# Patient Record
Sex: Female | Born: 1950
Health system: Southern US, Academic
[De-identification: ages and names within clinical notes are randomized; demographics above are authoritative.]

## PROBLEM LIST (undated history)

## (undated) ENCOUNTER — Encounter

## (undated) ENCOUNTER — Encounter: Attending: Hematology & Oncology | Primary: Hematology & Oncology

## (undated) ENCOUNTER — Inpatient Hospital Stay

## (undated) ENCOUNTER — Telehealth

## (undated) ENCOUNTER — Telehealth: Attending: Vascular Surgery | Primary: Vascular Surgery

## (undated) ENCOUNTER — Telehealth: Attending: Research Study | Primary: Research Study

## (undated) ENCOUNTER — Ambulatory Visit

## (undated) ENCOUNTER — Encounter: Attending: Internal Medicine | Primary: Internal Medicine

## (undated) ENCOUNTER — Ambulatory Visit: Payer: PRIVATE HEALTH INSURANCE | Attending: Radiation Oncology | Primary: Radiation Oncology

## (undated) ENCOUNTER — Telehealth
Attending: Pharmacist Clinician (PhC)/ Clinical Pharmacy Specialist | Primary: Pharmacist Clinician (PhC)/ Clinical Pharmacy Specialist

## (undated) ENCOUNTER — Encounter
Attending: Pharmacist Clinician (PhC)/ Clinical Pharmacy Specialist | Primary: Pharmacist Clinician (PhC)/ Clinical Pharmacy Specialist

## (undated) ENCOUNTER — Encounter: Payer: PRIVATE HEALTH INSURANCE | Attending: Hematology & Oncology | Primary: Hematology & Oncology

## (undated) ENCOUNTER — Ambulatory Visit: Attending: Radiation Oncology | Primary: Radiation Oncology

## (undated) ENCOUNTER — Encounter: Payer: PRIVATE HEALTH INSURANCE | Attending: Radiation Oncology | Primary: Radiation Oncology

## (undated) ENCOUNTER — Ambulatory Visit
Attending: Pharmacist Clinician (PhC)/ Clinical Pharmacy Specialist | Primary: Pharmacist Clinician (PhC)/ Clinical Pharmacy Specialist

## (undated) ENCOUNTER — Ambulatory Visit: Payer: PRIVATE HEALTH INSURANCE

## (undated) ENCOUNTER — Encounter: Payer: PRIVATE HEALTH INSURANCE | Attending: Vascular Surgery | Primary: Vascular Surgery

## (undated) ENCOUNTER — Telehealth: Attending: Hematology & Oncology | Primary: Hematology & Oncology

## (undated) DIAGNOSIS — I10 Essential (primary) hypertension: Secondary | ICD-10-CM

## (undated) DIAGNOSIS — L0390 Cellulitis, unspecified: Secondary | ICD-10-CM

## (undated) DIAGNOSIS — I469 Cardiac arrest, cause unspecified: Secondary | ICD-10-CM

## (undated) DIAGNOSIS — C801 Malignant (primary) neoplasm, unspecified: Secondary | ICD-10-CM

## (undated) DIAGNOSIS — C3490 Malignant neoplasm of unspecified part of unspecified bronchus or lung: Secondary | ICD-10-CM

## (undated) DIAGNOSIS — C229 Malignant neoplasm of liver, not specified as primary or secondary: Secondary | ICD-10-CM

## (undated) DIAGNOSIS — N289 Disorder of kidney and ureter, unspecified: Secondary | ICD-10-CM

## (undated) DIAGNOSIS — C719 Malignant neoplasm of brain, unspecified: Secondary | ICD-10-CM

## (undated) HISTORY — PX: LEG SURGERY: SHX1003

## (undated) HISTORY — PX: BRONCHOSCOPY: SUR163

## (undated) HISTORY — DX: Malignant neoplasm of unspecified part of unspecified bronchus or lung: C34.90

## (undated) HISTORY — PX: THORACENTESIS: SHX235

---
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
## 2015-11-07 ENCOUNTER — Emergency Department (HOSPITAL_BASED_OUTPATIENT_CLINIC_OR_DEPARTMENT_OTHER)
Admission: EM | Admit: 2015-11-07 | Discharge: 2015-11-07 | Disposition: A | Discharge status: Home / Self Care | Payer: BC Managed Care – PPO | Attending: Emergency Medicine | Admitting: Emergency Medicine

## 2015-11-07 ENCOUNTER — Encounter (HOSPITAL_BASED_OUTPATIENT_CLINIC_OR_DEPARTMENT_OTHER): Payer: Self-pay | Admitting: *Deleted

## 2015-11-07 ENCOUNTER — Emergency Department (HOSPITAL_BASED_OUTPATIENT_CLINIC_OR_DEPARTMENT_OTHER): Payer: BC Managed Care – PPO

## 2015-11-07 DIAGNOSIS — I82A12 Acute embolism and thrombosis of left axillary vein: Secondary | ICD-10-CM | POA: Insufficient documentation

## 2015-11-07 DIAGNOSIS — I10 Essential (primary) hypertension: Secondary | ICD-10-CM | POA: Insufficient documentation

## 2015-11-07 DIAGNOSIS — M7989 Other specified soft tissue disorders: Secondary | ICD-10-CM | POA: Diagnosis present

## 2015-11-07 DIAGNOSIS — Z79899 Other long term (current) drug therapy: Secondary | ICD-10-CM | POA: Diagnosis not present

## 2015-11-07 DIAGNOSIS — Z85118 Personal history of other malignant neoplasm of bronchus and lung: Secondary | ICD-10-CM | POA: Diagnosis not present

## 2015-11-07 HISTORY — DX: Disorder of kidney and ureter, unspecified: N28.9

## 2015-11-07 HISTORY — DX: Cellulitis, unspecified: L03.90

## 2015-11-07 HISTORY — DX: Essential (primary) hypertension: I10

## 2015-11-07 HISTORY — DX: Malignant (primary) neoplasm, unspecified: C80.1

## 2015-11-07 LAB — CBC WITH DIFFERENTIAL/PLATELET
BASOS ABS: 0 10*3/uL (ref 0.0–0.1)
BASOS PCT: 0 %
Eosinophils Absolute: 0.1 10*3/uL (ref 0.0–0.7)
Eosinophils Relative: 2 %
HEMATOCRIT: 34.6 % — AB (ref 36.0–46.0)
HEMOGLOBIN: 10.8 g/dL — AB (ref 12.0–15.0)
Lymphocytes Relative: 20 %
Lymphs Abs: 0.7 10*3/uL (ref 0.7–4.0)
MCH: 27.4 pg (ref 26.0–34.0)
MCHC: 31.2 g/dL (ref 30.0–36.0)
MCV: 87.8 fL (ref 78.0–100.0)
Monocytes Absolute: 0.5 10*3/uL (ref 0.1–1.0)
Monocytes Relative: 14 %
NEUTROS ABS: 2.3 10*3/uL (ref 1.7–7.7)
NEUTROS PCT: 64 %
Platelets: 233 10*3/uL (ref 150–400)
RBC: 3.94 MIL/uL (ref 3.87–5.11)
RDW: 16.4 % — ABNORMAL HIGH (ref 11.5–15.5)
WBC: 3.5 10*3/uL — ABNORMAL LOW (ref 4.0–10.5)

## 2015-11-07 LAB — BASIC METABOLIC PANEL
ANION GAP: 6 (ref 5–15)
BUN: 14 mg/dL (ref 6–20)
CALCIUM: 7.7 mg/dL — AB (ref 8.9–10.3)
CO2: 24 mmol/L (ref 22–32)
Chloride: 107 mmol/L (ref 101–111)
Creatinine, Ser: 0.71 mg/dL (ref 0.44–1.00)
Glucose, Bld: 90 mg/dL (ref 65–99)
POTASSIUM: 3.3 mmol/L — AB (ref 3.5–5.1)
Sodium: 137 mmol/L (ref 135–145)

## 2015-11-07 MED ORDER — RIVAROXABAN 15 MG PO TABS
15.0000 mg | ORAL_TABLET | Freq: Once | ORAL | Status: AC
  Administered 2015-11-07: 15 mg via ORAL
  Filled 2015-11-07: qty 1

## 2015-11-07 MED ORDER — RIVAROXABAN (XARELTO) EDUCATION KIT FOR DVT/PE PATIENTS
PACK | Freq: Once | Status: AC
  Administered 2015-11-07: 20:00:00

## 2015-11-07 MED ORDER — RIVAROXABAN (XARELTO) VTE STARTER PACK (15 & 20 MG): ORAL_TABLET | ORAL | 0 refills | Status: DC

## 2015-11-07 NOTE — ED Provider Notes (Signed)
[place] DEPT [facility] Provider Note   CSN: [id number] Arrival date & time: 11/07/15  1658  First Provider Contact:  First MD Initiated Contact with Patient 11/07/15 1808     By signing my name below, I, Ephriam Jenkins, attest that this documentation has been prepared under the direction and in the presence of [facility].  Electronically Signed: Ephriam Jenkins, ED Scribe. 11/07/15. 7:46 PM.  History   Chief Complaint Chief Complaint  Patient presents with  . Arm Swelling    HPI HPI Comments: Jennifer Delgado is a 65 y.o. female who presents to the Emergency Department complaining of gradually worsening swelling and pain to her left upper extremity that started 3 days ago. Pt receives chemotherapy treatment for her lung cancer and states she normally has mild edema to her upper extremities as a side effect of her treatment; but reports a gradual increase in swelling to her left arm. Pt states she had to cut her wedding ring off of her ring finger today due to the increase in swelling. Pt also reports pain to the upper aspect of her left arm. Pt reports that the pain to her arm is exacerbated by bending her arm and raising it above her head. Pt reports intermittent chills recently; none today. Pt currently takes Crizotinib. Pt denies any numbness or tingling, neck pain, chest pain, shortness of breath.      The history is provided by the patient. No language interpreter was used.    Past Medical History:  Diagnosis Date  . Cancer ([place])    lung  . Cellulitis   . Hypertension   . Renal disorder     There are no active problems to display for this patient.   Past Surgical History:  Procedure Laterality Date  . BRONCHOSCOPY    . THORACENTESIS      OB History    No data available       Home Medications    Prior to Admission medications   Medication Sig Start Date End Date Taking? Authorizing Provider  diclofenac sodium (VOLTAREN) 1 % GEL Apply topically 4 (four) times  daily.   Yes Historical Provider, MD  doxycycline (DORYX) 100 MG EC tablet Take 100 mg by mouth 2 (two) times daily.   Yes Historical Provider, MD  furosemide (LASIX) 20 MG tablet Take 20 mg by mouth.   Yes Historical Provider, MD  HYDROcodone-acetaminophen (NORCO) 10-325 MG tablet Take 1 tablet by mouth every 6 (six) hours as needed.   Yes Historical Provider, MD  Iron Combinations (CHROMAGEN) capsule Take 1 capsule by mouth daily.   Yes Historical Provider, MD  lisinopril (PRINIVIL,ZESTRIL) 10 MG tablet Take 10 mg by mouth daily.   Yes Historical Provider, MD  LORazepam (ATIVAN) 0.5 MG tablet Take 0.5 mg by mouth every 8 (eight) hours.   Yes Historical Provider, MD  potassium chloride SA (K-DUR,KLOR-CON) 20 MEQ tablet Take 20 mEq by mouth 2 (two) times daily.   Yes Historical Provider, MD  prochlorperazine (COMPAZINE) 10 MG tablet Take 10 mg by mouth every 6 (six) hours as needed for nausea or vomiting.   Yes Historical Provider, MD  sertraline (ZOLOFT) 100 MG tablet Take 100 mg by mouth daily.   Yes Historical Provider, MD  Rivaroxaban 15 & 20 MG TBPK Take as directed on package: Start with one 7m tablet by mouth twice a day with food. On Day 22, switch to one 256mtablet once a day with food. 11/07/15   KaGloriann LoanPA-C  Family History No family history on file.  Social History Social History  Substance Use Topics  . Smoking status: Never Smoker  . Smokeless tobacco: Never Used  . Alcohol use Yes     Allergies   Daptomycin and Dilaudid [hydromorphone]   Review of Systems Review of Systems  Constitutional: Positive for chills (intermittent).  Respiratory: Negative for shortness of breath.   Cardiovascular: Negative for chest pain.  Musculoskeletal: Positive for arthralgias (left upper extremity). Negative for neck pain.  Neurological: Negative for numbness.  All other systems reviewed and are negative.    Physical Exam Updated Vital Signs BP 126/66 (BP Location: Right  Arm)   Pulse 80   Temp 98.3 F (36.8 C) (Oral)   Resp 18   Ht 5' 5"  (1.651 m)   Wt 95.3 kg   SpO2 97%   BMI 34.95 kg/m   Physical Exam  Constitutional: She is oriented to person, place, and time. She appears well-developed and well-nourished.  Non-toxic appearance. She does not have a sickly appearance. She does not appear ill.  HENT:  Head: Normocephalic and atraumatic.  Mouth/Throat: Oropharynx is clear and moist.  Eyes: Conjunctivae are normal. Pupils are equal, round, and reactive to light.  Neck: Normal range of motion. Neck supple. No tracheal deviation present.  No neck swelling or color changes.   Cardiovascular: Normal rate and regular rhythm.   Pulses:      Radial pulses are 2+ on the right side, and 2+ on the left side.  Unilateral left upper extremity edema, non-pitting.  Pulmonary/Chest: Effort normal and breath sounds normal. No accessory muscle usage or stridor. No respiratory distress. She has no wheezes. She has no rhonchi. She has no rales.  Abdominal: Soft. Bowel sounds are normal. She exhibits no distension. There is no tenderness.  Musculoskeletal: Normal range of motion. She exhibits tenderness.  TTP along medial bicep.  No palpable cords.   Lymphadenopathy:    She has no cervical adenopathy.  Neurological: She is alert and oriented to person, place, and time.  Normal strength and sensation.   Skin: Skin is warm and dry. Capillary refill takes less than 2 seconds.  Scattered purpura over BUE.  NO signs of infection.   Psychiatric: She has a normal mood and affect. Her behavior is normal.     ED Treatments / Results  DIAGNOSTIC STUDIES: Oxygen Saturation is 99% on RA, normal by my interpretation.  COORDINATION OF CARE: 6:31 PM-Will order blood work, [place]. Discussed treatment plan with pt at bedside and pt agreed to plan.   Labs (all labs ordered are listed, but only abnormal results are displayed) Labs Reviewed  BASIC METABOLIC PANEL - Abnormal;  Notable for the following:       Result Value   Potassium 3.3 (*)    Calcium 7.7 (*)    All other components within normal limits  CBC WITH DIFFERENTIAL/PLATELET - Abnormal; Notable for the following:    WBC 3.5 (*)    Hemoglobin 10.8 (*)    HCT 34.6 (*)    RDW 16.4 (*)    All other components within normal limits    EKG  EKG Interpretation None       Radiology US Venous Img Upper Uni Left  Result Date: 11/07/2015 CLINICAL DATA:  Redness and swelling of elbow to hand. On chemotherapy for lung cancer. Recent IV insertion and left elbow area. EXAM: LEFT UPPER EXTREMITY VENOUS DOPPLER ULTRASOUND TECHNIQUE: Gray-scale sonography with graded compression, as well as color  Doppler and duplex ultrasound were performed to evaluate the upper extremity deep venous system from the level of the subclavian vein and including the jugular, axillary, basilic, radial, ulnar and upper cephalic vein. Spectral Doppler was utilized to evaluate flow at rest and with distal augmentation maneuvers. COMPARISON:  None. FINDINGS: Contralateral Subclavian Vein: Respiratory phasicity is normal and symmetric with the symptomatic side. No evidence of thrombus. Normal compressibility. Internal Jugular Vein: No evidence of thrombus. Normal compressibility, respiratory phasicity and response to augmentation. Subclavian Vein: No evidence of thrombus. Normal compressibility, respiratory phasicity and response to augmentation. Axillary Vein: Nonocclusive thrombus within the left axillary vein. Cephalic Vein: No evidence of thrombus. Normal compressibility, respiratory phasicity and response to augmentation. Basilic Vein: Occlusive thrombus throughout the basilic vein, from axilla to mid forearm. Brachial Veins: No evidence of thrombus. Normal compressibility, respiratory phasicity and response to augmentation. Radial Veins: No evidence of thrombus. Normal compressibility, respiratory phasicity and response to augmentation. Ulnar  Veins: No evidence of thrombus. Normal compressibility, respiratory phasicity and response to augmentation. Venous Reflux:  None visualized. Other Findings:  None visualized. IMPRESSION: 1. Nonocclusive DVT within a focal segment of the upper left axillary vein. 2. Occlusive thrombus throughout within the superficial left basilic vein. These results will be called to the ordering clinician or representative by the Radiologist Assistant, and communication documented in the PACS or zVision Dashboard. Electronically Signed   By: Franki Cabot M.D.   On: 11/07/2015 19:30    Procedures Procedures (including critical care time)  Medications Ordered in ED Medications  rivaroxaban Alveda Reasons) Education Kit for DVT/PE patients ( Does not apply Given 11/07/15 2013)  Rivaroxaban (XARELTO) tablet 15 mg (15 mg Oral Given 11/07/15 2013)     Initial Impression / Assessment and Plan / ED Course  I have reviewed the triage vital signs and the nursing notes.  Pertinent labs & imaging results that were available during my care of the patient were reviewed by me and considered in my medical decision making (see chart for details).  Clinical Course   Patient presents with left arm swelling. Workup reveals a left upper extremity DVT. She does not have any chest pain, shortness of breath to suggest PE. VSS, NAD. Patient given an initial dose of xarelto an ED. We'll discharge home with xarelto and close PCP follow up.  Return precautions discussed including worsening pain, shortness of breath, or chest pain.  Patient agrees and acknowledges the above plan for discharge.  Case has been discussed with Dr. Lita Mains who agrees with the above plan for discharge.     Final Clinical Impressions(s) / ED Diagnoses   Final diagnoses:  Left arm swelling  Acute deep vein thrombosis (DVT) of axillary vein of left upper extremity (HCC)    New Prescriptions New Prescriptions   RIVAROXABAN 15 & 20 MG TBPK    Take as directed  on package: Start with one 16m tablet by mouth twice a day with food. On Day 22, switch to one 233mtablet once a day with food.   I personally performed the services described in this documentation, which was scribed in my presence. The recorded information has been reviewed and is accurate.     KaGloriann LoanPA-C 11/07/15 2055    DaJulianne RiceMD 11/10/15 2239

## 2015-11-07 NOTE — ED Notes (Signed)
Pt reports worsening of L arm swelling x 3 days ago with pain to upper arm. Pt also has red discoloration to L arm which she states is possibly a reaction from her chemo medication. Pt takes oral chemo daily. Finished radiation in November.

## 2015-11-07 NOTE — ED Notes (Signed)
Rad report:  1. Nonocclusive DVT within a focal segment of the upper left axillary vein. 2. Occlusive thrombus throughout within the superficial left basilic vein

## 2015-11-07 NOTE — ED Notes (Signed)
Patient transported to Ultrasound 

## 2015-11-07 NOTE — ED Triage Notes (Signed)
Pt reports left arm swelling (worse over the past 3days) and upper arm pain. Pt states she is currently on immunotherapy for lung cancer.

## 2015-11-07 NOTE — ED Notes (Addendum)
xarelto given with crackers, mentions L arm pain only, 2/10, (denies: CP, back pain, sob, nv, dizziness, palpitations, fever or other sx). Alert, NAD, calm, interactive, no dyspnea noted, skin W&D.

## 2015-11-08 NOTE — ED Notes (Signed)
Pt's daughter, Debe Coder, called to say that they could not locate Xarelto in the strength needed.   [facility] ([place], [place], [place])  [place]) who said they had the proper strength.  Called pt's daughter back and gave her this information.

## 2016-10-06 ENCOUNTER — Ambulatory Visit: Admission: RE | Admit: 2016-10-06 | Discharge: 2016-10-06 | Disposition: A | Discharge status: Home / Self Care

## 2016-10-06 DIAGNOSIS — L97921 Non-pressure chronic ulcer of unspecified part of left lower leg limited to breakdown of skin: Principal | ICD-10-CM

## 2016-10-06 DIAGNOSIS — M7989 Other specified soft tissue disorders: Principal | ICD-10-CM

## 2016-10-06 DIAGNOSIS — Z945 Skin transplant status: Secondary | ICD-10-CM

## 2016-10-06 DIAGNOSIS — M79605 Pain in left leg: Secondary | ICD-10-CM

## 2016-10-06 DIAGNOSIS — A0472 Enterocolitis due to Clostridium difficile, not specified as recurrent: Secondary | ICD-10-CM

## 2016-10-10 ENCOUNTER — Ambulatory Visit: Admission: RE | Admit: 2016-10-10 | Discharge: 2016-10-10 | Disposition: A | Discharge status: Home / Self Care

## 2016-10-10 DIAGNOSIS — C3490 Malignant neoplasm of unspecified part of unspecified bronchus or lung: Principal | ICD-10-CM

## 2016-10-17 MED ORDER — FUROSEMIDE 20 MG TABLET
ORAL_TABLET | 0 refills | 0 days | Status: CP
Start: 2016-10-17 — End: 2016-11-18

## 2016-10-20 ENCOUNTER — Ambulatory Visit
Admission: RE | Admit: 2016-10-20 | Discharge: 2016-10-20 | Disposition: A | Discharge status: Home / Self Care | Attending: Physician Assistant | Admitting: Physician Assistant

## 2016-10-20 DIAGNOSIS — I776 Arteritis, unspecified: Secondary | ICD-10-CM

## 2016-10-20 DIAGNOSIS — Z945 Skin transplant status: Secondary | ICD-10-CM

## 2016-10-20 DIAGNOSIS — I890 Lymphedema, not elsewhere classified: Secondary | ICD-10-CM

## 2016-10-20 DIAGNOSIS — L98491 Non-pressure chronic ulcer of skin of other sites limited to breakdown of skin: Principal | ICD-10-CM

## 2016-10-20 NOTE — Unmapped (Signed)
Patient Active Problem List   Diagnosis   ??? Hypokalemia   ??? Cellulitis of left lower extremity   ??? Post-herpetic polyneuropathy   ??? Diarrhea   ??? Malnutrition (CMS-HCC)   ??? Anemia   ??? Weakness   ??? Acute cystitis without hematuria   ??? Non-traumatic rhabdomyolysis   ??? Elevated LFTs   ??? Primary adenocarcinoma of right lung (CMS-HCC)   ??? Hypoalbuminemia   ??? Need for pneumococcal vaccination   ??? Leg ulcer, left (CMS-HCC)   ??? Hypertension   ??? Vasculitis determined by biopsy of skin (CMS-HCC)   ??? Dermatitis   ??? Lymphedema of left leg   ??? Risk for falls   ??? Elevated serum creatinine   ??? S/P split thickness skin graft   ??? Colitis due to Clostridium difficile       Angelica [name] Is a 66 year old female with the above medical problems who returns for routine follow-up of her left lower extremity ulcer related to lymphedema and vasculitis. She is status post split thickness skin graft on 08/16/2016. Her course has been slow for improvement but she is showing continued epithelialization scattered throughout the ulcer. This ulcers on the distal left calf which is circumferential. She has been using her lymphedema pump 2 control lower extremity swelling. No recent fevers, chills, or night sweats or other new concerns. She had C. difficile colitis post hospitalization. She completed 2 courses of vancomycin. She no longer is having any episodes of diarrhea.    Physical exam    Gen.: 65 year old female in no apparent distress.    Blood pressure 135/70, pulse 80, temperature 36.6 ??C (97.9 ??F), temperature source Temporal, height 165.1 cm (5' 5), weight (!) 103 kg (227 lb), not currently breastfeeding.    Wound Back Left;Upper pink unapproximated no drainage (Active)       Wound 09/15/16 Leg Left (Active)   Wound Status Not Healed 10/06/2016  1:29 PM   Pain 4 10/06/2016  1:29 PM   Dressing Status      Removed 10/20/2016  1:08 PM   Wound Length (cm)      7.9 10/20/2016  1:08 PM   Wound Width (cm)      25.3 10/20/2016  1:08 PM   Wound Depth (cm) 0.2 10/20/2016  1:08 PM   Area (sq cm) 199.87 10/20/2016  1:08 PM   Volume (mL) 39.97 10/20/2016  1:08 PM   Odor None 10/20/2016  1:08 PM   Margins Defined edges 10/20/2016  1:08 PM   Peri-wound Assessment      Edema;Pink;Hemosiderin staining 10/20/2016  1:08 PM   Encounter Subsequent 10/20/2016  1:08 PM   Slough % 26-50% 10/20/2016  1:08 PM   Eschar % None 10/06/2016  1:29 PM   Epithelialization % 1-25% 10/20/2016  1:08 PM   Granulation % 26-50% 10/20/2016  1:08 PM   Exudate Type      Sero-sanguineous 10/20/2016  1:08 PM   Exudate Amnt      Moderate 10/20/2016  1:08 PM   Thickness Full Thickness 10/20/2016  1:08 PM   Exposed Structure N/A 10/20/2016  1:08 PM   Paring No 10/20/2016  1:08 PM   Texture Edema 10/20/2016  1:08 PM   Moisture Maceration 10/20/2016  1:08 PM   Temperature WNL 10/20/2016  1:08 PM   S/S Infection No 10/20/2016  1:08 PM   Hypergranuation No 10/06/2016  1:29 PM   Tunneling      No 10/06/2016  1:29 PM   Undermining  No 10/06/2016  1:29 PM   Sinus Tract No 10/06/2016  1:29 PM   Treatments Pharmaceutical agent;Cleansed/Irrigation 10/20/2016  1:08 PM   Picture Taken Yes 10/20/2016  1:08 PM   Dressing ABD;Other (Comment);Vaseline gauze 10/20/2016  2:12 PM   Length (Pre Debridement) 9.4 cm 10/06/2016  2:12 PM   Width (Pre Debridement) 24 cm 10/06/2016  2:12 PM   Depth (Pre Debridement) 0.2 cm 10/06/2016  2:12 PM   Pre-Procedure Pain 4 10/06/2016  2:12 PM   Time Out Taken Yes 10/06/2016  2:12 PM   Instrument Used curette 10/06/2016  2:12 PM   Tissue/Material Removed non-viable 10/06/2016  2:12 PM   Bleeding minimal 10/06/2016  2:12 PM   Bleeding controlled with pressure 10/06/2016  2:12 PM   Specimen Taken none 10/06/2016  2:12 PM   Type of Debridement selective 10/06/2016  2:12 PM   Level of Debridement skin, dermis 10/06/2016  2:12 PM   Procedural Pain 4 10/06/2016  2:12 PM   Length (Post-Debridement) 9.4 cm 10/06/2016  2:12 PM   Width (Post Debridement) 24 cm 10/06/2016  2:12 PM   Depth (Post-Debridement) 0.2 cm 10/06/2016  2:12 PM   Post Procedural Pain 4 10/06/2016  2:12 PM   Area(Pre-Debridement) 225.6 sq cm 10/06/2016  2:12 PM   Area(Post-Debridement) 225.6 sq cm 10/06/2016  2:12 PM   Volume(Pre-Debridement) 45.12 sq cm 10/06/2016  2:12 PM   Volume(Post-Debridement) 45.12 sq cm 10/06/2016  2:12 PM     Extremities: Strong palpable pedal pulses noted. Her lower extremity edema is very well controlled. On the anterior aspect there is near 100% epithelialization. This also goes for the lateral and the posterior calf portion of the ulcer. The slowest 2 epithelializes along the medial calf. Biofilm is easily wiped off with 4 x 4 gauze. It is 100% granulated. Periwound is clean dry and intact and free of cellulitis.    Impression: Left lower extremity leg ulcer with continued slow improvement after split-thickness skin graft    Treatment plan    1. Monitor for infection. Signs and symptoms reviewed with the patient. None present today.    2. Edema control is imperative. Continue with SurePress compression wraps and lymphedema pump.    3. Absorptive primary dressing and secondary pads.    4. Overall the patient continues to improve despite all of her medical problems. Apparently pleased with this. We will now see her back in 2 weeks for routine evaluation and dressing change and return precautions have been reviewed in detail. The patient was seen personally by Dr. Gayla Doss.

## 2016-10-24 MED ORDER — SERTRALINE 100 MG TABLET
ORAL_TABLET | 6 refills | 0 days | Status: CP
Start: 2016-10-24 — End: 2017-05-13

## 2016-10-25 NOTE — Unmapped (Signed)
[facility] Refill Coordination Note  Specialty Medication(s): Angelica [name]  Additional Medications shipped: none    Angelica [name], DOB: 11-15-1950  Phone: [phone] (home) , Alternate phone contact: N/A  Phone or address changes today?: No  All above HIPAA information was verified with patient.  Shipping Address: [address]  [place] [address]   Insurance changes? No    Completed refill call assessment today to schedule patient's medication shipment from the [facility] [phone]).      Confirmed the medication and dosage are correct and have not changed: Yes, regimen is correct and unchanged.    Confirmed patient started or stopped the following medications in the past month:  No, there are no changes reported at this time.    Are you tolerating your medication?:  Angelica [name] reports tolerating the medication.    ADHERENCE    Is this medicine transplant or covered by Medicare Part B? No.      Did you miss any doses in the past 4 weeks? No missed doses reported.    FINANCIAL/SHIPPING    Delivery Scheduled: Yes, Expected medication delivery date: 8/2     [name] did not have any additional questions at this time.    Delivery address validated in [facility] scheduling system: Yes, address listed in [facility] is correct.    We will follow up with patient monthly for standard refill processing and delivery.      Thank you,  Clydell Hakim   [facility] Specialty Pharmacist

## 2016-10-26 ENCOUNTER — Ambulatory Visit
Admission: RE | Admit: 2016-10-26 | Discharge: 2016-10-26 | Disposition: A | Discharge status: Home / Self Care | Attending: Hematology & Oncology

## 2016-10-26 ENCOUNTER — Ambulatory Visit
Admission: RE | Admit: 2016-10-26 | Discharge: 2016-10-26 | Disposition: A | Discharge status: Home / Self Care | Attending: Hematology & Oncology | Admitting: Hematology & Oncology

## 2016-10-26 ENCOUNTER — Ambulatory Visit: Admission: RE | Admit: 2016-10-26 | Discharge: 2016-10-26 | Disposition: A | Discharge status: Home / Self Care

## 2016-10-26 DIAGNOSIS — C3491 Malignant neoplasm of unspecified part of right bronchus or lung: Principal | ICD-10-CM

## 2016-10-26 DIAGNOSIS — G4700 Insomnia, unspecified: Secondary | ICD-10-CM

## 2016-10-26 DIAGNOSIS — C3490 Malignant neoplasm of unspecified part of unspecified bronchus or lung: Principal | ICD-10-CM

## 2016-10-26 DIAGNOSIS — F324 Major depressive disorder, single episode, in partial remission: Secondary | ICD-10-CM

## 2016-10-26 LAB — COMPREHENSIVE METABOLIC PANEL
ALBUMIN: 3.7 g/dL (ref 3.5–5.0)
ALKALINE PHOSPHATASE: 194 U/L — ABNORMAL HIGH (ref 38–126)
ALT (SGPT): 17 U/L (ref 15–48)
ANION GAP: 11 mmol/L (ref 9–15)
AST (SGOT): 19 U/L (ref 14–38)
BILIRUBIN TOTAL: 0.4 mg/dL (ref 0.0–1.2)
BLOOD UREA NITROGEN: 21 mg/dL (ref 7–21)
BUN / CREAT RATIO: 24
CALCIUM: 8.7 mg/dL (ref 8.5–10.2)
CHLORIDE: 102 mmol/L (ref 98–107)
CO2: 24 mmol/L (ref 22.0–30.0)
CREATININE: 0.88 mg/dL (ref 0.60–1.00)
EGFR MDRD AF AMER: >=60 mL/min/{1.73_m2} (ref >=60)
EGFR MDRD NON AF AMER: >=60 mL/min/{1.73_m2} (ref >=60)
GLUCOSE RANDOM: 110 mg/dL (ref 65–179)
PROTEIN TOTAL: 7.3 g/dL (ref 6.5–8.3)

## 2016-10-26 LAB — CBC W/ AUTO DIFF
BASOPHILS ABSOLUTE COUNT: 0 10*9/L (ref 0.0–0.1)
EOSINOPHILS ABSOLUTE COUNT: 0.1 10*9/L (ref 0.0–0.4)
HEMATOCRIT: 31 % — ABNORMAL LOW (ref 36.0–46.0)
HEMOGLOBIN: 10.3 g/dL — ABNORMAL LOW (ref 12.0–16.0)
LARGE UNSTAINED CELLS: 1 % (ref 0–4)
LYMPHOCYTES ABSOLUTE COUNT: 1.2 10*9/L — ABNORMAL LOW (ref 1.5–5.0)
MEAN CORPUSCULAR HEMOGLOBIN CONC: 33.3 g/dL (ref 31.0–37.0)
MEAN CORPUSCULAR HEMOGLOBIN: 29.3 pg (ref 26.0–34.0)
MEAN CORPUSCULAR VOLUME: 88.1 fL (ref 80.0–100.0)
MONOCYTES ABSOLUTE COUNT: 0.2 10*9/L (ref 0.2–0.8)
PLATELET COUNT: 263 10*9/L (ref 150–440)
RED BLOOD CELL COUNT: 3.53 10*12/L — ABNORMAL LOW (ref 4.00–5.20)
RED CELL DISTRIBUTION WIDTH: 18.1 % — ABNORMAL HIGH (ref 12.0–15.0)

## 2016-10-26 LAB — ANISOCYTOSIS

## 2016-10-26 LAB — POTASSIUM: Potassium:SCnc:Pt:Ser/Plas:Qn:: 4

## 2016-10-26 MED ORDER — HYDROCODONE 5 MG-ACETAMINOPHEN 325 MG TABLET
ORAL_TABLET | Freq: Four times a day (QID) | ORAL | 0 refills | 0.00000 days | Status: CP
Start: 2016-10-26 — End: 2016-12-07

## 2016-10-26 MED ORDER — LORAZEPAM 0.5 MG TABLET
ORAL_TABLET | 0 refills | 0 days | Status: CP
Start: 2016-10-26 — End: 2016-12-07

## 2016-10-26 NOTE — Unmapped (Addendum)
-   Your scans look good, showing the therapy looks like it is shrinking the tumor  - We will continue the alectanib as you are tolerating this well  - We will see you back in 6 weeks and repeat scans around [facility]

## 2016-10-26 NOTE — Unmapped (Signed)
REFERRING PROVIDER:   Rae Lips, Fnp  [address]  [address]  [facility]  [place], [place] [address]    PRIMARY CARE PROVIDER:  Quentin Ore, FNP  [address] [facility]  [place] [address]    CONSULTING PROVIDERS:  Dr. Jackelyn Poling    DIAGNOSIS: Stage IV adenoCA Lung  MOLECULAR PROFILE: +ALK Re-arrangment  CURRENT TREATMENT: alectinib since Aug 2017  PREVIOUS TREATMENT: crizotinib x17mos  REASON FOR VISIT: [name], a 66 y.o. female is seen today for further mgmt of stage IV adenocarcinoma of the lung.    ASSESSMENT/PLAN  1. Stage IV adenocarcinoma of the lung; ALK+.  Metastases to bilateral supraclavicular, mediastinal, and R hilar node, T7 and T10, and right pleural fluid. CT scans on 11/03/2015 indicated progression of disease.  Switched therapy to alectinib and it is going well so far. CT scans from 01/18/16 indicate response. CT scans from 07/11/2016 which showed stable disease.  There was a indeterminate sclerotic lesion in the sternum which was present on the 11/03/2015 scan.  Scans on 10/10/16 show no new concerning lesions with ongoing decrease in right lower lobe nodule    No new or concerning symptoms today, so we will continue on the alectinib and she'll see me back in 6 weeks for lab work and tox check.  We'll repeat scans at the subsequent follow up, around October/November 2018.    2. AKI: Cr elevated at hospital discharge on 5/20.  Will follow up on repeat labs today.    3. Ulcerated lipodermatosclerosis of LLE;  Likely 2/2 chronic venous insufficiency.  Being followed closely by wound care.    4. HTN:  Cont current meds.  PCP will continue to assist w/ management     5. Other medical problems: Rhabdomyolysis likely secondary to daptomycin, acute kidney injury secondary to rhabdo and HTN (resolved).    6. Depression. Appreciate [facility] support.  [facility] following.  Previously improved with anti-depressant, will continue this today. She also uses ativan prn.    7. Pain: cont hydrocodone/APAP prn. Refilled today. Neuropathic in nature and mostly at night. Improved significantly with gabapentin qhs.        HISTORY OF PRESENT ILLNESS: Angelica [name] presents today as a new patient for further evaluation of stage IV adenocarcinoma of the lung. Please see below in oncology history for further details. In summary, patient presented with nausea and vomiting, and underwent workup including CT scan of the abdomen, which revealed 2.8 cm lung nodule in the right lower lung base. Further staging workup including PET CT scan revealed R lung nodules/masses. Bilateral supraclavicular, mediastinal, and R hilar node mets. Bone mets T7 and T10. Mod sized R pleural effusion, small L pleural effusion. Thoracentesis of right pleural effusion showed malignant cells. Right subclavian lymph node FNA also showed pathologic findings consistent with adenocarcinoma of lung primary. The patient visited cancer Hospital today for medical and radiation oncology evaluation.    Patient was previously treated for suspected cellulitis of the left lower extremity at [facility] from 10/12-10/27/16 and was discharged on IV daptomycin, IV ertapenem, and Pulse Vac therapy. She was readmitted on 02/01/15 due to weakness and rhabdomyolysis likely secondary to daptomycin. On 02/04/15 she was transferred to [facility] for further management. She was initially started on IV Zyvox and continued on the IV Invanz. Dermatology was consulted and the patient's presentation was thought to be more consistent with lipodermatosclerosis.  Tissue biopsy was consistent with lipodermatosclerosis secondary to chronic venous stasis.    INTERVAL HX:     Today she is doing well on alectinib. No new issues with obtaining or taking drug.  No new complaints, although she continues to have some degree of ongoing fatigue which she attributes to the alectinib.  Does not limit daily activity.  Denies nausea, vomiting, fevers, chills, or worsening dyspnea on exertion. No new CNS symptoms. Mood has been stable.      Oncology History    11        Primary adenocarcinoma of right lung (CMS-HCC)    01/14/2015 -  Presenting Symptoms     Presented with nausea, vomiting and diarrhea. Underwent abdominal CT scan, which incidentally found 2.8 cm nodule in the right lung base with multiple w/ additional sub cm ground-glass nodules.         01/16/2015 Biopsy     EBUS Bx of RLL - negative for malignancy         02/05/2015 -  Cancer Staged     PET/CT : FDG avid R lung nodules/masses. Bilateral supraclavicular, mediastinal, and R hilar node mets. Bone mets T7 and T10. Mod sized R pleural effusion, small L pleural effusion. Mild FDG avid b/l iliac, inguinal, femoral LNs - likely benign.         02/09/2015 Procedure     R thoracentesis 750cc removed - positive for malignancy.          02/09/2015 Biopsy     R supraclavicular LN FNA - positive for malignancy.            PAST MEDICAL HISTORY  Past Medical History:   Diagnosis Date   ??? Asthma    ??? Cellulitis    ??? Diverticulitis    ??? Hypertension 04/27/2015   ??? Lung cancer, lower lobe (CMS-HCC)     RIGHT   ??? Renal failure     AKI with failure now resolved   ??? Seasonal allergies    ??? Shingles        Allergies   Allergen Reactions   ??? Daptomycin Other (See Comments)     Rhabdomyolysis   ??? Dilaudid [Hydromorphone] Nausea And Vomiting       MEDICATIONS  Current Outpatient Prescriptions on File Prior to Visit   Medication Sig Dispense Refill   ??? albuterol (PROAIR HFA) 90 mcg/actuation inhaler Inhale 2 puffs every six (6) hours as needed for wheezing. 1 Inhaler 3   ??? ALECENSA 150 mg capsule TAKE 4 CAPSULES (600 MG) BY MOUTH TWICE DAILY WITH MEALS 240 capsule 11   ??? cetirizine (ZYRTEC) 10 MG tablet Take 10 mg by mouth nightly.      ??? elastic bandage (SUREPRESS HIGH COMPRESSION) 4 X 3.2 -yard [employer] Apply 1 application topically daily. Apply as directed; remove at night 1 each 0   ??? foam bandage 2  Bndg Apply 1 application topically daily. 1 each 0   ??? furosemide (LASIX) 20 MG tablet TAKE 1 TABLET(20 MG) BY MOUTH TWICE DAILY 60 tablet 0   ??? gabapentin (NEURONTIN) 300 MG capsule Take 3 capsules (900 mg total) by mouth nightly. 90 capsule 5   ??? lisinopril (PRINIVIL,ZESTRIL) 10 MG tablet Take 1 tablet (10 mg total) by mouth daily. 90 tablet 3   ??? potassium chloride 20 mEq TbER Take 20 mEq by mouth Two (2) times a day. 60 tablet 0   ??? prochlorperazine (COMPAZINE) 10 MG tablet TAKE 1 TABLET BY MOUTH EVERY  6 HOURS AS NEEDED FOR NAUSEA 60 tablet 0   ??? ranitidine (ZANTAC) 150 MG tablet Take 150 mg by mouth continuous as needed for heartburn.     ??? sertraline (ZOLOFT) 100 MG tablet TAKE 1 AND 1/2 TABLETS BY MOUTH EVERY DAY 45 tablet 6   ??? triamcinolone (KENALOG) 0.1 % cream Apply topically Two (2) times a day. 85 g 2   ??? VASHE WOUND THERAPY 0.033 % IrSl IRRIGATE WITH 16 OZ AS DIRECTED TWICE DAILY 3000 mL 0   ??? [DISCONTINUED] HYDROcodone-acetaminophen (NORCO) 5-325 mg per tablet Take 1 tablet by mouth Every six (6) hours. 120 tablet 0   ??? [DISCONTINUED] LORazepam (ATIVAN) 0.5 MG tablet Take 1 tablet (0.5 mg total) by mouth nightly as needed for anxiety or other. 30 tablet 0     No current facility-administered medications on file prior to visit.        FAMILY HISTORY  Cancer-related family history includes Breast cancer in her mother.    SOCIAL HISTORY  she is a never smoker, no Etoh.    - Married, mother of 2. 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     PHYSICAL EXAM  ECOG Performance Status: 1  Vitals:    10/26/16 1237   BP: 146/75   Pulse: 80   Resp: 16   Temp: 36.6 ??C (97.9 ??F)   SpO2: 98%     GEN: Awake and alert, pleasant appearing female in no acute distress  HEENT: Pupils equally round without scleral icterus  LYMPH: No palpable cervical or clavicular lymph nodes  CV: Regular rhythm with normal rate, no murmurs  LUNGS: Clear to auscultation bilaterally without wheeze or rhonchi, slight decrease in right base.  SKIN: No rashes or lesions over forearms, face, neck  ABD: Soft and non-tender with no distention or palpable masses. Bowel sounds are active  [facility]: Alert and oriented to person, place and time  EXT: LLE wrapped post recent skin transplant, RLE w/no pitting edema.    DIAGNOSTIC DATA: I have personally reviewed the patient's records in EPIC including but not limited to clinic notes, admission notes, operative reports, pathology reports, lab results and imaging results.     RADIOLOGY RESULTS: I have personally reviewed the patient's CT scan or PET scan on PACS that shows the following results.     CT C/A/P 10/10/2016  Impression Mild interval decrease in the size of 1.1 cm right lower lobe nodule, previously measured 1.5 cm on 07/11/2016.    Multiple bibasilar micronodules, likely mucoid impacted airways.           CT chest/abdomen 07/11/2016  Impression     Right lower lobe pulmonary nodule, grossly unchanged.       CT chest abdomen and pelvis 04/11/2016  Impression     Grossly unchanged spiculated nodule in the right lower lobe. No new lung lesions identified.    Subcentimeter sclerotic lesion in the sternum, not seen on the chest CT dated 01/15/2015, indeterminate. Attention on follow-up.         Brain MRI 11/03/15:     Impression     No evidence of metastatic disease to the brain.    New focus of signal abnormality within the right precentral gyrus without enhancement which may represent an interval, age-indeterminate infarct.  CT 01/18/16:  Impression     -Decreasing fissural nodularity about the right major minor fissures, decreasing left subpectoral/axillary lymphadenopathy, as well as, decrease in now trace right pleural effusion and pericardial effusion.   -Stable right lower lobe lesion. No new sites of disease identified.         PATHOLOGY:  02/09/2015  Final Diagnosis     A: Lymph node, right supraclavicular, fine needle aspiration  - Malignant cells present  - Consistent with metastatic lung adenocarcinoma (see comment)   Immunohistochemical stains were performed and tumor cells are diffusely positive for TTF-1 and negative for p40, supporting the diagnosis of metastatic lung adenocarcinoma. Molecular testing will be performed and results will be reported separately.    02/09/2015  A. Pleural fluid, right, thoracentesis  - Malignant cells present  - Consistent with patient's known metastatic lung adenocarcinoma.    MOLECULAR TESTING:  +ALK re-arrangement on in house cytogenetics.

## 2016-10-26 NOTE — Unmapped (Signed)
Lab drawn and sent for analysis.

## 2016-10-26 NOTE — Unmapped (Signed)
[facility]  Comprehensive Cancer Support Program/Psychiatry   Established Patient E&M Service and Psychotherapy      Name: Angelica [name]  Date: 10/26/2016  MRN: [id number]  DOB: 10-Aug-1950  PCP: Quentin Ore, FNP  Oncologist: Dr. Lendell Caprice     Time Spent: 45 minutes  Therapy type: supportive, at least 30 minutes devoted to therapy  Purpose: target anxiety and depression     Assessment:  Angelica [name] is a 66 y.o., female?? with a history of stage IV adenocarcinoma of the lung, referred by her oncology team for evaluation of current sadness and anxiety.?? She initially ??had a series of serious medical diagnoses as well as setbacks but she eventually recovered. Then she had some progression but for many months now, her cancer has been stable. She continues to stay active and positive and she acknowledges that the sertraline has helped her overall. Her mood is stable on sertraline to 150 mg; yet she still  needs??lorazepam nightly for sleep.  Her wound is healing better, however she has had some recent DOE which she is somewhat concerned about. ??We will continue to follow her closely.   ??  Risk Assessment:  A suicide and violence risk assessment was performed as part of this evaluation. There patient is deemed to be at chronic elevated risk for self-harm/suicide given the following factors: recent onset of serious medical condition and current diagnosis of depression. There patient is deemed to be at chronic elevated risk for violence given the following factors: N/A. These risk factors are mitigated by the following factors:lack of active SI/HI, no known access to weapons or firearms, no history of previous suicide attempts , no history of violence, motivation for treatment, utilization of positive coping skills, supportive family, sense of responsibility to family and social supports, presence of a significant relationship, presence of an available support system, employment or functioning in a structured work/academic setting, enjoyment of leisure actvities, expresses purpose for living, religious or spiritual prohibition to suicide/violence, current treatment compliance, effective problem solving skills, safe housing, support system in agreement with treatment recommendations and presence of a safety plan with follow-up care. There is no acute risk for suicide or violence at this time. The patient was educated about relevant modifiable risk factors including following recommendations for treatment of psychiatric illness and abstaining from substance abuse. While future psychiatric events cannot be accurately predicted, the patient does not currently require?? acute inpatient psychiatric care and does not currently meet [facility] involuntary commitment criteria.??????????????????????      Diagnoses:   Patient Active Problem List   Diagnosis   ??? Hypokalemia   ??? Cellulitis of left lower extremity   ??? Post-herpetic polyneuropathy   ??? Diarrhea   ??? Malnutrition (CMS-HCC)   ??? Anemia   ??? Weakness   ??? Acute cystitis without hematuria   ??? Non-traumatic rhabdomyolysis   ??? Elevated LFTs   ??? Primary adenocarcinoma of right lung (CMS-HCC)   ??? Hypoalbuminemia   ??? Need for pneumococcal vaccination   ??? Leg ulcer, left (CMS-HCC)   ??? Hypertension   ??? Vasculitis determined by biopsy of skin (CMS-HCC)   ??? Dermatitis   ??? Lymphedema of left leg   ??? Risk for falls   ??? Elevated serum creatinine   ??? S/P split thickness skin graft   ??? Colitis due to Clostridium difficile        Stressors:metastatic cancer  Disability Assessment Scale: WHODAS clinically estimated as 30-40  Plan:  .#Depressive episode, improving ????  - Continue sertraline??150 mg  - Continue lorazepam 0.5 mg QHS  - Provided supportive and problem-solving psychotherapy to address her anxiety    Patient has been given this writer's business card with confidential voicemail number. She has been instructed to call [phone] for emergencies.  Follow-up with me in 8 weeks.    Revised Medication(s) Post Visit:  Outpatient Encounter Prescriptions as of 10/26/2016   Medication Sig Dispense Refill   ??? albuterol (PROAIR HFA) 90 mcg/actuation inhaler Inhale 2 puffs every six (6) hours as needed for wheezing. 1 Inhaler 3   ??? ALECENSA 150 mg capsule TAKE 4 CAPSULES (600 MG) BY MOUTH TWICE DAILY WITH MEALS 240 capsule 11   ??? cetirizine (ZYRTEC) 10 MG tablet Take 10 mg by mouth nightly.      ??? elastic bandage (SUREPRESS HIGH COMPRESSION) 4 X 3.2 -yard [employer] Apply 1 application topically daily. Apply as directed; remove at night 1 each 0   ??? foam bandage 2  Bndg Apply 1 application topically daily. 1 each 0   ??? furosemide (LASIX) 20 MG tablet TAKE 1 TABLET(20 MG) BY MOUTH TWICE DAILY 60 tablet 0   ??? gabapentin (NEURONTIN) 300 MG capsule Take 3 capsules (900 mg total) by mouth nightly. 90 capsule 5   ??? HYDROcodone-acetaminophen (NORCO) 5-325 mg per tablet Take 1 tablet by mouth Every six (6) hours. 120 tablet 0   ??? lisinopril (PRINIVIL,ZESTRIL) 10 MG tablet Take 1 tablet (10 mg total) by mouth daily. 90 tablet 3   ??? LORazepam (ATIVAN) 0.5 MG tablet Take 1 tablet (0.5 mg total) by mouth nightly as needed for anxiety or other. 30 tablet 0   ??? potassium chloride 20 mEq TbER Take 20 mEq by mouth Two (2) times a day. 60 tablet 0   ??? prochlorperazine (COMPAZINE) 10 MG tablet TAKE 1 TABLET BY MOUTH EVERY 6 HOURS AS NEEDED FOR NAUSEA 60 tablet 0   ??? ranitidine (ZANTAC) 150 MG tablet Take 150 mg by mouth continuous as needed for heartburn.     ??? sertraline (ZOLOFT) 100 MG tablet TAKE 1 AND 1/2 TABLETS BY MOUTH EVERY DAY 45 tablet 6   ??? triamcinolone (KENALOG) 0.1 % cream Apply topically Two (2) times a day. 85 g 2   ??? VASHE WOUND THERAPY 0.033 % IrSl IRRIGATE WITH 16 OZ AS DIRECTED TWICE DAILY 3000 mL 0     No facility-administered encounter medications on file as of 10/26/2016.         Subjective:   Mood is good, long time to get to sleep but will stay asleep. She needs ativan to sleep nightly however. Low energy still, fatigued. There was been a change in her breathing and she has DOE now so she is limited to what she can do. Overall, anxiety is good, but her world has shrunk now so her world is small and so her anxiety is more manageable. She and her husband are empty nesters now, her daughter just graduated from college and she has a job working for [employer] in [place]; they are adjusting. Her son lives in [place]. Her husband is good and his health is good. Visits with her family and lunch with friends make her happy and she will look forward to that. Her anxiety does get a little heightened with her visits here, but she really lives day to day and does not think much about her cancer progression. She was hospitalized two times since she last saw me  for the wound on her leg, but it it is getting better and is much improved now. She denies mania or SI.       Medications/Allergies: reviewed    Medical History/Surgical History/Social history:reviewed    Medication(s) on Presentation:   Outpatient Medications Prior to Visit   Medication Sig Dispense Refill   ??? albuterol (PROAIR HFA) 90 mcg/actuation inhaler Inhale 2 puffs every six (6) hours as needed for wheezing. 1 Inhaler 3   ??? ALECENSA 150 mg capsule TAKE 4 CAPSULES (600 MG) BY MOUTH TWICE DAILY WITH MEALS 240 capsule 11   ??? cetirizine (ZYRTEC) 10 MG tablet Take 10 mg by mouth nightly.      ??? elastic bandage (SUREPRESS HIGH COMPRESSION) 4 X 3.2 -yard [employer] Apply 1 application topically daily. Apply as directed; remove at night 1 each 0   ??? foam bandage 2  Bndg Apply 1 application topically daily. 1 each 0   ??? furosemide (LASIX) 20 MG tablet TAKE 1 TABLET(20 MG) BY MOUTH TWICE DAILY 60 tablet 0   ??? gabapentin (NEURONTIN) 300 MG capsule Take 3 capsules (900 mg total) by mouth nightly. 90 capsule 5   ??? HYDROcodone-acetaminophen (NORCO) 5-325 mg per tablet Take 1 tablet by mouth Every six (6) hours. 120 tablet 0   ??? lisinopril (PRINIVIL,ZESTRIL) 10 MG tablet Take 1 tablet (10 mg total) by mouth daily. 90 tablet 3   ??? LORazepam (ATIVAN) 0.5 MG tablet Take 1 tablet (0.5 mg total) by mouth nightly as needed for anxiety or other. 30 tablet 0   ??? potassium chloride 20 mEq TbER Take 20 mEq by mouth Two (2) times a day. 60 tablet 0   ??? prochlorperazine (COMPAZINE) 10 MG tablet TAKE 1 TABLET BY MOUTH EVERY 6 HOURS AS NEEDED FOR NAUSEA 60 tablet 0   ??? ranitidine (ZANTAC) 150 MG tablet Take 150 mg by mouth continuous as needed for heartburn.     ??? sertraline (ZOLOFT) 100 MG tablet TAKE 1 AND 1/2 TABLETS BY MOUTH EVERY DAY 45 tablet 6   ??? triamcinolone (KENALOG) 0.1 % cream Apply topically Two (2) times a day. 85 g 2   ??? VASHE WOUND THERAPY 0.033 % IrSl IRRIGATE WITH 16 OZ AS DIRECTED TWICE DAILY 3000 mL 0     No facility-administered medications prior to visit.        ROS: The balance of 10 systems is negative except for the following: leg pain and DOE     Objective:    Vitals:   There were no vitals filed for this visit.    Mental Status Exam:  Appearance:    Appears stated age   Motor:   No abnormal movements   Speech/Language:    Normal rate, volume, tone, fluency   Mood:   ok,good   Affect:   calm, cooperative, dysthymic    Thought process:   Logical, linear, clear, coherent, goal directed   Thought content:     Denies SI, HI, self harm, delusions, obsessions, paranoid ideation, or ideas of reference   Perceptual disturbances:     Denies auditory and visual hallucinations, behavior not concerning for response to internal stimuli     Orientation:   Oriented to person, place, time, and general circumstances   Attention:   Able to fully attend without fluctuations in consciousness   Concentration:   Able to fully concentrate and attend   Memory:   Immediate, short-term, long-term, and recall grossly intact    Fund of knowledge:  Consistent with level of education and development   Insight:     Intact   Judgment:    Intact   Impulse Control:   Intact     PE:   Gen: in NAD  Neuro: Cranial nerves II-XII grossly intact, antalgic gait, no tremor observed.     Test Results:  Data Review:   Lab results last 24 hours:    Recent Results (from the past 24 hour(s))   Comprehensive Metabolic Panel    Collection Time: 10/26/16 10:59 AM   Result Value Ref Range    Sodium 137 135 - 145 mmol/L    Potassium 4.0 3.5 - 5.0 mmol/L    Chloride 102 98 - 107 mmol/L    CO2 24.0 22.0 - 30.0 mmol/L    BUN 21 7 - 21 mg/dL    Creatinine 1.61 0.96 - 1.00 mg/dL    BUN/Creatinine Ratio 24     EGFR MDRD Non Af Amer >=60 >=60 mL/min/1.49m2    EGFR MDRD Af Amer >=60 >=60 mL/min/1.22m2    Anion Gap 11 9 - 15 mmol/L    Glucose 110 65 - 179 mg/dL    Calcium 8.7 8.5 - 04.5 mg/dL    Albumin 3.7 3.5 - 5.0 g/dL    Total Protein 7.3 6.5 - 8.3 g/dL    Total Bilirubin 0.4 0.0 - 1.2 mg/dL    AST 19 14 - 38 U/L    ALT 17 15 - 48 U/L    Alkaline Phosphatase 194 (H) 38 - 126 U/L   CBC w/ Differential    Collection Time: 10/26/16 10:59 AM   Result Value Ref Range    WBC 6.0 4.5 - 11.0 10*9/L    RBC 3.53 (L) 4.00 - 5.20 10*12/L    HGB 10.3 (L) 12.0 - 16.0 g/dL    HCT 40.9 (L) 81.1 - 46.0 %    MCV 88.1 80.0 - 100.0 fL    MCH 29.3 26.0 - 34.0 pg    MCHC 33.3 31.0 - 37.0 g/dL    RDW 91.4 (H) 78.2 - 15.0 %    MPV 7.9 7.0 - 10.0 fL    Platelet 263 150 - 440 10*9/L    Absolute Neutrophils 4.3 2.0 - 7.5 10*9/L    Absolute Lymphocytes 1.2 (L) 1.5 - 5.0 10*9/L    Absolute Monocytes 0.2 0.2 - 0.8 10*9/L    Absolute Eosinophils 0.1 0.0 - 0.4 10*9/L    Absolute Basophils 0.0 0.0 - 0.1 10*9/L    Large Unstained Cells 1 0 - 4 %    Macrocytosis Slight (A) Not Present    Anisocytosis Moderate (A) Not Present     Imaging: none    Maxton Noreen Sherlynn Stalls, [place], [place]   10/26/2016

## 2016-10-26 NOTE — Unmapped (Signed)
Comprehensive Cancer Support Program - Psychiatry Outpatient Clinic   After Visit Summary    Continue these medicines: zoloft and ativan     Medication changes:none today     Our next appointment is in 6-8 weeks     [facility]: [phone].     If you are taking any controlled substances (such as anxiety or sleep medications), you must use them as the directions say to use them. We cannot provide early refills, and it would be inappropriate to obtain the medications from other doctors. We routinely use the [place] controlled substance database to monitor prescription drug use.     If you need to get in touch with me for a non-urgent issue, the best way to do so is through sending a [employer] using the Patient Advice Request.    You can also contact Myrene Galas, the [facility] Clinic Coordinator, at [phone] for any scheduling issues, or if you have an issue during business hours.    In order to receive updates on clinic closings due to adverse weather,   please call [phone].    The [place] crisis psychiatry line can be reached for after hours urgent issues at:   [phone]. You can also call the I need to talk line-- [phone]-TALK ([phone]) connects you to a skilled, trained counselor at a crisis center in your area, anytime 24/7.

## 2016-10-30 NOTE — Unmapped (Signed)
Patient Active Problem List   Diagnosis   ??? Hypokalemia   ??? Cellulitis of left lower extremity   ??? Post-herpetic polyneuropathy   ??? Diarrhea   ??? Malnutrition (CMS-HCC)   ??? Anemia   ??? Weakness   ??? Acute cystitis without hematuria   ??? Non-traumatic rhabdomyolysis   ??? Elevated LFTs   ??? Primary adenocarcinoma of right lung (CMS-HCC)   ??? Hypoalbuminemia   ??? Need for pneumococcal vaccination   ??? Leg ulcer, left (CMS-HCC)   ??? Hypertension   ??? Vasculitis determined by biopsy of skin (CMS-HCC)   ??? Dermatitis   ??? Lymphedema of left leg   ??? Risk for falls   ??? Elevated serum creatinine   ??? S/P split thickness skin graft   ??? Colitis due to Clostridium difficile       Angelica [name] is a 66 yo female who returns fo ra routine f/u for her left LE calf ulcer s/p STSG on 5/15 fo rher non-healing leg ulcer. She required hospitalization for cellulitis of her left leg and required IV antibiotics. She developed c diff colitis and just recently finished her po vancomycin. However, she is still having multiple, 6-10, loose/watery BM's per day and does not feel her c. Diff has resolved. She is experiencing increased pain of the left leg along with redness and swelling. She has high risk factors of previous DVT and current active lung CA. No recent fevers or shaking chills. No increased drainage from the circumferential ulcer.    Physical xam    General: 60 female in NAD    Blood pressure 113/58, pulse 80, temperature 36.1 ??C (97 ??F), temperature source Temporal, not currently breastfeeding.    Wound Back Left;Upper pink unapproximated no drainage (Active)       Wound 09/15/16 Leg Left (Active)   Wound Status Not Healed 10/06/2016  1:29 PM   Pain 4 10/06/2016  1:29 PM   Dressing Status      Removed 10/20/2016  1:08 PM   Wound Length (cm)      7.9 10/20/2016  1:08 PM   Wound Width (cm)      25.3 10/20/2016  1:08 PM   Wound Depth (cm)      0.2 10/20/2016  1:08 PM   Area (sq cm) 199.87 10/20/2016  1:08 PM   Volume (mL) 39.97 10/20/2016  1:08 PM   Odor None 10/20/2016  1:08 PM   Margins Defined edges 10/20/2016  1:08 PM   Peri-wound Assessment      Edema;Pink;Hemosiderin staining 10/20/2016  1:08 PM   Encounter Subsequent 10/20/2016  1:08 PM   Slough % 26-50% 10/20/2016  1:08 PM   Eschar % None 10/06/2016  1:29 PM   Epithelialization % 1-25% 10/20/2016  1:08 PM   Granulation % 26-50% 10/20/2016  1:08 PM   Exudate Type      Sero-sanguineous 10/20/2016  1:08 PM   Exudate Amnt      Moderate 10/20/2016  1:08 PM   Thickness Full Thickness 10/20/2016  1:08 PM   Exposed Structure N/A 10/20/2016  1:08 PM   Paring No 10/20/2016  1:08 PM   Texture Edema 10/20/2016  1:08 PM   Moisture Maceration 10/20/2016  1:08 PM   Temperature WNL 10/20/2016  1:08 PM   S/S Infection No 10/20/2016  1:08 PM   Hypergranuation No 10/06/2016  1:29 PM   Tunneling      No 10/06/2016  1:29 PM   Undermining     No 10/06/2016  1:29 PM   Sinus Tract No 10/06/2016  1:29 PM   Treatments Pharmaceutical agent;Cleansed/Irrigation 10/20/2016  1:08 PM   Picture Taken Yes 10/20/2016  1:08 PM   Dressing ABD;Other (Comment);Vaseline gauze 10/20/2016  2:12 PM   Length (Pre Debridement) 9.4 cm 10/06/2016  2:12 PM   Width (Pre Debridement) 24 cm 10/06/2016  2:12 PM   Depth (Pre Debridement) 0.2 cm 10/06/2016  2:12 PM   Pre-Procedure Pain 4 10/06/2016  2:12 PM   Time Out Taken Yes 10/06/2016  2:12 PM   Instrument Used curette 10/06/2016  2:12 PM   Tissue/Material Removed non-viable 10/06/2016  2:12 PM   Bleeding minimal 10/06/2016  2:12 PM   Bleeding controlled with pressure 10/06/2016  2:12 PM   Specimen Taken none 10/06/2016  2:12 PM   Type of Debridement selective 10/06/2016  2:12 PM   Level of Debridement skin, dermis 10/06/2016  2:12 PM   Procedural Pain 4 10/06/2016  2:12 PM   Length (Post-Debridement) 9.4 cm 10/06/2016  2:12 PM   Width (Post Debridement) 24 cm 10/06/2016  2:12 PM   Depth (Post-Debridement) 0.2 cm 10/06/2016  2:12 PM   Post Procedural Pain 4 10/06/2016  2:12 PM   Area(Pre-Debridement) 225.6 sq cm 10/06/2016  2:12 PM   Area(Post-Debridement) 225.6 sq cm 10/06/2016  2:12 PM   Volume(Pre-Debridement) 45.12 sq cm 10/06/2016  2:12 PM   Volume(Post-Debridement) 45.12 sq cm 10/06/2016  2:12 PM     Extremities: Mild erythema just around the ulcer periphery. TTP on posterior calf. New epithelial tissue scattered throughout the calf ulcer. Good progression towards healing. There is increased edema of the left leg. Drainage is clear and no odor from the ulcer. Heavy biofilm in certain areas along the medial calf - debridement as below.    Procedure: Based on the necrotic tissue and biofilm present, the decision was made to debride. All Benefits, risks, and alternatives were explained in detail to the patient. A time out was taken prior to the procedure. The area was prepped and draped in standard clinic fashion. Selective debridement was performed to fully debride all devitalized and necrotic tissue down to bleeding granulation tissue with the use of a curette, forceps and scalpel. Topical 4% lidocaine was applied 10 minutes prior to the debridement and the patient tolerated this well. Hemostasis was achieved with pressure and the measurements did not change post-operatively.      Imaging studies: Given her high risk of increased leg pain, CA, and h/o DVT, a venous doppler was ordered. No e/o acute DVT.    Impression: Left leg ulcer with increased edema related to lymphedema. Unresolved C. Diff colitis    Treatment plan:    1. Increase use of lymphedema pump to 2-3x/day    2. Limb elevation    3. More absorptive dressings  than current alginate. Multi-layer compression    4. Renewed prescription for PO Vancomycin    5. Close f/u in 1-2 weeks and return precautions advised.

## 2016-11-01 MED ORDER — PROCHLORPERAZINE MALEATE 10 MG TABLET
ORAL_TABLET | 3 refills | 0 days | Status: CP
Start: 2016-11-01 — End: 2017-06-04

## 2016-11-02 MED FILL — ALECENSA/150MG/CAPS: ALECENSA/150MG/CAPS | 30 days supply | Qty: 240 | Fill #1

## 2016-11-10 ENCOUNTER — Ambulatory Visit
Admission: RE | Admit: 2016-11-10 | Discharge: 2016-11-10 | Disposition: A | Discharge status: Home / Self Care | Attending: Physician Assistant | Admitting: Physician Assistant

## 2016-11-10 DIAGNOSIS — L98491 Non-pressure chronic ulcer of skin of other sites limited to breakdown of skin: Principal | ICD-10-CM

## 2016-11-10 DIAGNOSIS — S91209A Unspecified open wound of unspecified toe(s) with damage to nail, initial encounter: Secondary | ICD-10-CM

## 2016-11-10 DIAGNOSIS — I776 Arteritis, unspecified: Secondary | ICD-10-CM

## 2016-11-10 DIAGNOSIS — I890 Lymphedema, not elsewhere classified: Secondary | ICD-10-CM

## 2016-11-10 DIAGNOSIS — Z945 Skin transplant status: Secondary | ICD-10-CM

## 2016-11-10 NOTE — Unmapped (Addendum)
WOUND CARE INSTRUCTIONS:        Wash area with Antibacterial liquid soap with one wash cloth, clean the soap off with a clean cloth, and then pat dry with a dry cloth before the dressing change.    Dressing:     Left Leg:  -Apply adaptic touch, followed by Drawtex, abd, kerlix and Surepress. Change Daily    Left Great Toe: Apply hydrogel, foam, conform, secure with tape. Change every other day.      If you have any questions or concerns regarding your wound or wound care, please contact us at the [facility] clinic at [phone].    If your wound starts to develop the following , please call the [facility] for further advise:    ??  Increased drainage  ??  Redness around the wound  ??  Strong odor from the wound when changing the bandages  ??  Increased pain    Please do not hesitate to leave a voicemail on the nurse line. We make every effort to return your call the same day or the next day. Please leave a clear message with your name, date of birth,  and your medical record number. Leave a brief description of your problem.    If you are experiencing the following, please call us for advise or consider going to the nearest local Emergency Department or call 911.    ??  Fever of 100 F  ??  Nausea or Vomitting  ??  Pus draining from your wound  ??  Redness of the whole foot or leg  ??  Severe increase in pain above your baseline.    [facility]  [phone]

## 2016-11-10 NOTE — Unmapped (Signed)
Debridement  Date/Time: 11/10/2016 4:13 PM  Performed by: Barbette Or  Authorized by: Barbette Or   Preparation: Patient was prepped and draped in the usual sterile fashion.  Local anesthesia used: yes    Anesthesia:  Local anesthesia used: yes  Local Anesthetic: topical anesthetic    Sedation:  Patient sedated: no  Patient tolerance: Patient tolerated the procedure well with no immediate complications  Comments: Based on the necrotic tissue and biofilm present, the decision was made to debride. All Benefits, risks, and alternatives were explained in detail to the patient. A time out was taken prior to the procedure. The area was prepped and draped in standard clinic fashion. Selective debridement was performed to fully debride all devitalized and necrotic tissue down to bleeding granulation tissue with the use of a curette, forceps and scalpel. Topical 4% lidocaine was applied 10 minutes prior to the debridement and the patient tolerated this well. Hemostasis was achieved with pressure and the measurements did not change post-operatively.               Angelica [name] is a pleasant 66 year old female with the above medical problems and returns for routine follow-up of her left lower extremity calf ulcer. She had split-thickness skin graft on 08/16/2016. Some of the graft took well with the exception of the medial calf region. It still drains a moderate amount but continues to slowly improve. She reports that 2 days ago she stubbed her toe and the nail pulled back and bled. She would like for this to be evaluated.    Review of systems: No recent fevers, chills, or night sweats. Other system review negative except for that mentioned in above history of present illness.    Physical exam    Gen.: 66 year old female in no apparent distress.    Blood pressure 130/65, pulse 77, temperature 36.2 ??C (97.2 ??F), not currently breastfeeding.    Wound Back Left;Upper pink unapproximated no drainage (Active)       Wound 09/15/16 Leg Left (Active)   Wound Status Not Healed 11/10/2016  1:49 PM   Pain 2 11/10/2016  1:49 PM   Dressing Status      Removed 11/10/2016  1:49 PM   Wound Length (cm)      7.6 11/10/2016  1:49 PM   Wound Width (cm)      26 11/10/2016  1:49 PM   Wound Depth (cm)      0.2 11/10/2016  1:49 PM   Area (sq cm) 197.6 11/10/2016  1:49 PM   Volume (mL) 39.52 11/10/2016  1:49 PM   Odor None 11/10/2016  1:49 PM   Margins Defined edges 11/10/2016  1:49 PM   Peri-wound Assessment      Clean;Dry 11/10/2016  1:49 PM   Encounter Subsequent 11/10/2016  1:49 PM   Slough % 1-25% 11/10/2016  1:49 PM   Epithelialization % 26-50% 11/10/2016  1:49 PM   Granulation % 26-50% 11/10/2016  1:49 PM   Exudate Type      Sero-sanguineous 11/10/2016  1:49 PM   Exudate Amnt      Moderate 11/10/2016  1:49 PM   Thickness Full Thickness 11/10/2016  1:49 PM   Exposed Structure N/A 10/20/2016  1:08 PM   Paring No 10/20/2016  1:08 PM   Texture Edema 11/10/2016  1:49 PM   Moisture Normal For Patient 11/10/2016  1:49 PM   Temperature WNL 11/10/2016  1:49 PM   S/S Infection No 10/20/2016  1:08 PM   Treatments  Cleansed/Irrigation;Pharmaceutical agent 11/10/2016  1:49 PM   Picture Taken Yes 11/10/2016  1:49 PM   Dressing Other (Comment) 11/10/2016  1:49 PM   Length (Pre Debridement) 7.6 cm 11/10/2016  1:49 PM   Width (Pre Debridement) 26 cm 11/10/2016  1:49 PM   Depth (Pre Debridement) 0.2 cm 11/10/2016  1:49 PM   Pre-Procedure Pain 0 11/10/2016  1:49 PM   Time Out Taken Yes 11/10/2016  1:49 PM   Instrument Used curette;forceps 11/10/2016  1:49 PM   Tissue/Material Removed slough;non-viable 11/10/2016  1:49 PM   Bleeding minimal 11/10/2016  1:49 PM   Bleeding controlled with pressure 11/10/2016  1:49 PM   Specimen Taken none 11/10/2016  1:49 PM   Type of Debridement selective 11/10/2016  1:49 PM   Level of Debridement skin, dermis 11/10/2016  1:49 PM   Procedural Pain 0 11/10/2016  1:49 PM   Length (Post-Debridement) 7.6 cm 11/10/2016  1:49 PM   Width (Post Debridement) 26 cm 11/10/2016  1:49 PM   Depth (Post-Debridement) 0.2 cm 11/10/2016 1:49 PM   Post Procedural Pain 0 11/10/2016  1:49 PM   Area(Pre-Debridement) 197.6 sq cm 11/10/2016  1:49 PM   Area(Post-Debridement) 197.6 sq cm 11/10/2016  1:49 PM   Volume(Pre-Debridement) 39.52 sq cm 11/10/2016  1:49 PM   Volume(Post-Debridement) 39.52 sq cm 11/10/2016  1:49 PM     Extremities: The left calf ulcer has markedly improved on the anterior, lateral, and posterior aspect. Only the medial ulcer region on the left leg has very little epithelialization noted. There is moderate biofilm and necrotic tissue which is selectively debrided. There is no evidence of infection. The toenail of the right great toe is very loosely adherent and I snipped the remaining aspect of the nail off. No formal debridement was performed. For patient comfort some topical 4% lidocaine was applied ??10 minutes and I was able to clean up the dried blood and debris that had collected underneath the nail. The toe did not appear edematous or erythematous. It was mildly sore but nontender to palpation. No evidence of acute infection today.    Impression: Slowly improving right calf ulcer now almost 3 months postop split-thickness skin graft. Nail trauma left great toe.    Treatment plan    1. A PolyMem silver toe dressing was applied to the left great toe. I will see her back on Monday to evaluate this. We did have an extra dressing and gave her the extra although if it is not draining significantly she can leave today's dressing on until Monday. However if it starts to hurt or feel swollen she can take down the dressing to evaluate, clean this area, and come in through the emergency room if she feels that the area has become infected. I did not see any evidence of infection and did not feel that an x-ray was warranted. Most of the trauma was just because the nail pulled back and bled. Once cleaned up the toe appeared noninfected.    2. Continue with Adaptic touch,Drawtex, extra sorb and SurePress for the left leg ulcer. She will continue with her cleansing regimen with use of Anasept and Hibiclens. For this problem we can see her back in 2-3 weeks.    3. If for any reason her toe starts to worsen she needs to come in through the emergency room. Otherwise I will see her on Monday for a quick check. I did not have anything to culture and would like to avoid antibiotics if possible given  her severe bout of C. difficile.

## 2016-11-14 ENCOUNTER — Ambulatory Visit: Admission: RE | Admit: 2016-11-14 | Discharge: 2016-11-14 | Disposition: A | Discharge status: Home / Self Care

## 2016-11-14 DIAGNOSIS — L97521 Non-pressure chronic ulcer of other part of left foot limited to breakdown of skin: Principal | ICD-10-CM

## 2016-11-14 NOTE — Unmapped (Signed)
Leg and Ankle Edema: Care Instructions  Your Care Instructions  Swelling in the legs, ankles, and feet is called edema. It is common after you sit or stand for a while. Long plane flights or car rides often cause swelling in the legs and feet. You may also have swelling if you have to stand for long periods of time at your job. Problems with the veins in the legs (varicose veins) and changes in hormones can also cause swelling. Sometimes the swelling in the ankles and feet is caused by a more serious problem, such as heart failure, infection, blood clots, or liver or kidney disease.  Follow-up care is a key part of your treatment and safety. Be sure to make and go to all appointments, and call your doctor if you are having problems. It's also a good idea to know your test results and keep a list of the medicines you take.  How can you care for yourself at home?  ?? If your doctor gave you medicine, take it as prescribed. Call your doctor if you think you are having a problem with your medicine.  ?? Whenever you are resting, raise your legs up. Try to keep the swollen area higher than the level of your heart.  ?? Take breaks from standing or sitting in one position.  ?? Walk around to increase the blood flow in your lower legs.  ?? Move your feet and ankles often while you stand, or tighten and relax your leg muscles.  ?? Wear support stockings. Put them on in the morning, before swelling gets worse.  ?? Eat a balanced diet. Lose weight if you need to.  ?? Limit the amount of salt (sodium) in your diet. Salt holds fluid in the body and may increase swelling.  When should you call for help?  Call 911 anytime you think you may need emergency care. For example, call if:  ?? ?? You have symptoms of a blood clot in your lung (called a pulmonary embolism). These may include:  ?? Sudden chest pain.  ?? Trouble breathing.  ?? Coughing up blood.   ??Call your doctor now or seek immediate medical care if:  ?? ?? You have signs of a blood clot, such as:  ?? Pain in your calf, back of the knee, thigh, or groin.  ?? Redness and swelling in your leg or groin.   ?? ?? You have symptoms of infection, such as:  ?? Increased pain, swelling, warmth, or redness.  ?? Red streaks or pus.  ?? A fever.   ??Watch closely for changes in your health, and be sure to contact your doctor if:  ?? ?? Your swelling is getting worse.   ?? ?? You have new or worsening pain in your legs.   ?? ?? You do not get better as expected.   Where can you learn more?  Go to [facility] at [email or web address]  Select Preferences in the upper right hand corner, then select Health Library under Resources. Enter [phone] in the search box to learn more about Leg and Ankle Edema: Care Instructions.  Current as of: February 22, 2016  Content Version: 11.7  ?? 2006-2018 [employer]. Care instructions adapted under license by [facility]. If you have questions about a medical condition or this instruction, always ask your healthcare professional. [employer] disclaims any warranty or liability for your use of this information.

## 2016-11-16 MED ORDER — AMOXICILLIN 875 MG TABLET
ORAL_TABLET | Freq: Two times a day (BID) | ORAL | 0 refills | 0 days | Status: CP
Start: 2016-11-16 — End: 2016-11-23

## 2016-11-17 NOTE — Unmapped (Signed)
I spoke with Angelica [name] and informed her of her culture results and the antibiotics that were called in for her.  She verbalized understanding of the instructions and parameters to call clinic back with problems.

## 2016-11-17 NOTE — Unmapped (Signed)
-----   Message from [facility] [place], [place] sent at 11/16/2016  9:52 PM EDT -----  Please let the patient know that her culture is showing staph and strep. I have called in a 7 day supply of amoxicillin 875 mg po bid. Take with food. I she develops ANY watery diarrhea at all she is to call the clinic. Has a h/o c. Diff. Thanks.    Lattie Corns, PA-C  [facility]

## 2016-11-17 NOTE — Unmapped (Signed)
Patient Active Problem List   Diagnosis   ??? Hypokalemia   ??? Cellulitis of left lower extremity   ??? Post-herpetic polyneuropathy   ??? Diarrhea   ??? Malnutrition (CMS-HCC)   ??? Anemia   ??? Weakness   ??? Acute cystitis without hematuria   ??? Non-traumatic rhabdomyolysis   ??? Elevated LFTs   ??? Primary adenocarcinoma of right lung (CMS-HCC)   ??? Hypoalbuminemia   ??? Need for pneumococcal vaccination   ??? Leg ulcer, left (CMS-HCC)   ??? Hypertension   ??? Vasculitis determined by biopsy of skin (CMS-HCC)   ??? Dermatitis   ??? Lymphedema of left leg   ??? Risk for falls   ??? Elevated serum creatinine   ??? S/P split thickness skin graft   ??? Colitis due to Clostridium difficile   ??? Skin ulcer of left great toe, limited to breakdown of skin (CMS-HCC)     Angelica [name] is a 66 year old female was just recently seen on 11/10/2016 secondary to nail avulsion.  She had stopped her to home and this caused the nail to be peeled back and I removed a lot of the dried bleeding as well as the loosely adherent nail.  She comes in today for a quick wound check of her left great toe.  We have just evaluated the leg ulcers so we have left her bandages in place today.  She has had no fevers, chills, or night sweats and she denies any pain in the toe.  She states that the PolyMem toe dressing came off after a couple of days and she tried to place the other one.  She had difficulty with this.  Minimal drainage reported.  No other new concerns.    Physical exam    General: 67 year old female in no apparent distress.    Blood pressure 140/68, pulse 77, temperature 36.5 ??C (97.7 ??F), temperature source Oral, not currently breastfeeding.    Chest: Normal work of breathing.    Heart: Regular rate and rhythm.    Extremities: On the left great toe was evaluated today.  There is no erythema or edema to suggest acute severe infection.  At the tip of the toe nail there is some loosely adherent tissue that is trimmed with para scissors.  No formal debridement occurred today. There is a very small punctate ulcer approximately 2-3 mm in diameter.  Scant drainage noted.  No evidence of acute infection and it does not probe deeply.    Impression: Left great toe ulcer status post trauma    Treatment plan    1.  I have cultured this small wound given her history of infections and immunosuppression.  I have not empirically started her on antibiotics at this time.  He does not appear acutely infected but if there is a heavy growth of any bacterial colonization then a short course of antibiotics may be warranted.  Given her recent history of C. Difficile, I would like to avoid antibiotics unless absolutely warranted based on culture results.  Given the fact that she has immunosuppressive medications on board for her cancer she may have a very difficult time healing this ulcer.  Therefore a 7 day course of an antibiotic only would be recommended at this time.  Culture and sensitivity panel pending.    2.  We will use some methylene blue foam which may be soft and we will work with her on how to keep this dressing in place.    3.  Return to clinic in  1 week for routine evaluation and dressing change and return precautions reviewed in detail.

## 2016-11-18 MED ORDER — FUROSEMIDE 20 MG TABLET
ORAL_TABLET | 6 refills | 0 days | Status: CP
Start: 2016-11-18 — End: 2017-01-12

## 2016-11-25 ENCOUNTER — Ambulatory Visit: Admission: RE | Admit: 2016-11-25 | Discharge: 2016-11-25 | Disposition: A | Discharge status: Home / Self Care

## 2016-11-25 DIAGNOSIS — I890 Lymphedema, not elsewhere classified: Secondary | ICD-10-CM

## 2016-11-25 DIAGNOSIS — L98491 Non-pressure chronic ulcer of skin of other sites limited to breakdown of skin: Principal | ICD-10-CM

## 2016-11-25 NOTE — Unmapped (Signed)
[facility] Return Visit Note        SUBJECTIVE:         History of Present Illness    Angelica [name] is a 66 y.o. female, with the following medical problems:    Patient Active Problem List   Diagnosis   ??? Hypokalemia   ??? Cellulitis of left lower extremity   ??? Post-herpetic polyneuropathy   ??? Diarrhea   ??? Malnutrition (CMS-HCC)   ??? Anemia   ??? Weakness   ??? Acute cystitis without hematuria   ??? Non-traumatic rhabdomyolysis   ??? Elevated LFTs   ??? Primary adenocarcinoma of right lung (CMS-HCC)   ??? Hypoalbuminemia   ??? Need for pneumococcal vaccination   ??? Leg ulcer, left (CMS-HCC)   ??? Hypertension   ??? Vasculitis determined by biopsy of skin (CMS-HCC)   ??? Dermatitis   ??? Lymphedema of left leg   ??? Risk for falls   ??? Elevated serum creatinine   ??? S/P split thickness skin graft   ??? Colitis due to Clostridium difficile   ??? Skin ulcer of left great toe, limited to breakdown of skin (CMS-HCC)       Patient returns to the [facility] with improving L calf ulcers.  She's currently on absorptive dressings and Surepress for compression.  Denies fever, chills, diaphoresis, malaise, nausea, or vomiting.      Past Medical, Family, and Social History    The following portions of the patient's history were reviewed and updated as appropriate: allergies, current medications, past medical history and problem list.    Review of Systems    The following systems were reviewed and found to be negative: Constitutional, Cardiovascular and Respiratory.  See HPI for other pertinent ROS.            OBJECTIVE:        Physical Exam  Vitals: BP 137/69  - Pulse 73  - Temp 36.6 ??C (97.9 ??F) (Oral)  - Ht 165.1 cm (5' 5)  - Wt (!) 102.5 kg (226 lb)  - LMP  (LMP Unknown)  - BMI 37.61 kg/m??    General Apperance: obese, comfortable and no acute distress   Eyes: pink conjunctivae, anicteric sclerae and lids clear   ENT / Mouth: ears and nose without external deformities, masses, or lesions and hearing grossly intact   Neck: neck symmetric, trachea midline   Respiratory:  normal respiratory effort   Cardiovascular:  Pedal pulses palpable   Musculoskeletal:  no clubbing, cyanosis, effusion   Skin:  Smaller full thickness L distal calf wound with granulation tissue and epithelialization. No signs of infeciton   Psychiatric:  normal mood, appropriate affect     Diagnostic Data  Labs / Tests:    Results for orders placed or performed in visit on 11/14/16   Aerobic Culture   Result Value Ref Range    Aerobic Culture       MDR SCREEN:  This culture was screened and determined to be negative for multi-drug resistant Enterobacteriaceae.    Aerobic Culture 3+ Streptococcus agalactiae (group b) (A)     Aerobic Culture 1+ Methicillin-Susceptible Staphylococcus aureus (A)     Aerobic Culture       2+ Mixed Gram Positive/Gram Negative Organisms Isolated    Gram Stain Result No polymorphonuclear leukocytes seen     Gram Stain Result 1+ Gram positive cocci     Gram Stain Result 1+ Gram positive bacilli  Susceptibility    Methicillin-Susceptible Staphylococcus aureus - MIC SUSCEPTIBILITY RESULT     Vancomycin 2 Susceptible     Methicillin-Susceptible Staphylococcus aureus - KIRBY BAUER     Oxacillin*  Susceptible       * For predictive information: [email or web address]       Erythromycin  Susceptible      Clindamycin  Susceptible      Doxycycline  Susceptible      Gentamicin*  Susceptible       * Gentamicin is used only in combination with other active agents that test susceptible       Trimethoprim + Sulfamethoxazole  Susceptible      Fluoroquinolone*  No Interpretation       * Fluoroquinolones are not indicated for the treatment of staphylococcal infections, including ORSA.        Imaging:  Radiology studies were personally reviewed          ASSESSMENT / PLAN:      66 y.o. female, presenting with:    ##   L calf chronic ulcer(s)  ?? Improving  ?? Continue absorptive dressings  ?? Continue Surepress  ?? RTC in 2 wks or sooner prn

## 2016-11-25 NOTE — Unmapped (Signed)
Dressing:    Wash area with Antibacterial liquid soap with one wash cloth, clean the soap off with a clean cloth, and then pat dry with a dry cloth before the dressing change.    Continue with you current dressing of adaptic touch, draw tex, abd, kerlix, Surepress      If you have any questions or concerns regarding your wound or wound care, please contact us at the [facility] clinic at [phone].

## 2016-11-30 NOTE — Unmapped (Signed)
Specialty Pharmacy Refill Coordination Note     Angelica [name] is a 66 y.o. female contacted today regarding refills of her specialty medication(s).    Reviewed and verified with patient:      Specialty medication(s) and dose(s) confirmed: yes  Changes to medications: no  Changes to insurance: no    Medication Adherence    Patient reported X missed doses in the last month:  0  Specialty Medication:  Alecensa 150mg   Informant:  patient  Reliability of informant:  reliable  Provider-estimated medication adherence level:  90-100%  Confirmed plan for next specialty medication refill:  delivery by pharmacy  Medication Assistance Program  Refill Coordination  Has the Patient's Contact Information Changed:  No  Is the Shipping Address Different:  No  Shipping Information  Delivery Scheduled:  Yes  Delivery Date:  12/08/16  Medications to be Shipped:  Alecensa 150mg           Follow-up: 1 month(s)     Angelica [name]

## 2016-12-07 ENCOUNTER — Ambulatory Visit
Admission: RE | Admit: 2016-12-07 | Discharge: 2016-12-07 | Disposition: A | Discharge status: Home / Self Care | Attending: Physician Assistant | Admitting: Physician Assistant

## 2016-12-07 ENCOUNTER — Ambulatory Visit: Admission: RE | Admit: 2016-12-07 | Discharge: 2016-12-07 | Disposition: A | Discharge status: Home / Self Care

## 2016-12-07 DIAGNOSIS — Z8739 Personal history of other diseases of the musculoskeletal system and connective tissue: Secondary | ICD-10-CM

## 2016-12-07 DIAGNOSIS — C3490 Malignant neoplasm of unspecified part of unspecified bronchus or lung: Secondary | ICD-10-CM

## 2016-12-07 DIAGNOSIS — R4020 Unspecified coma: Secondary | ICD-10-CM

## 2016-12-07 DIAGNOSIS — F418 Other specified anxiety disorders: Secondary | ICD-10-CM

## 2016-12-07 DIAGNOSIS — C3491 Malignant neoplasm of unspecified part of right bronchus or lung: Secondary | ICD-10-CM

## 2016-12-07 DIAGNOSIS — F324 Major depressive disorder, single episode, in partial remission: Secondary | ICD-10-CM

## 2016-12-07 DIAGNOSIS — R7989 Other specified abnormal findings of blood chemistry: Principal | ICD-10-CM

## 2016-12-07 LAB — RED CELL DISTRIBUTION WIDTH: Lab: 19.1 — ABNORMAL HIGH

## 2016-12-07 LAB — CBC W/ AUTO DIFF
BASOPHILS ABSOLUTE COUNT: 0 10*9/L (ref 0.0–0.1)
EOSINOPHILS ABSOLUTE COUNT: 0.1 10*9/L (ref 0.0–0.4)
HEMATOCRIT: 34.8 % — ABNORMAL LOW (ref 36.0–46.0)
HEMOGLOBIN: 11.2 g/dL — ABNORMAL LOW (ref 12.0–16.0)
LARGE UNSTAINED CELLS: 2 % (ref 0–4)
LYMPHOCYTES ABSOLUTE COUNT: 1.1 10*9/L — ABNORMAL LOW (ref 1.5–5.0)
MEAN CORPUSCULAR HEMOGLOBIN: 32.2 pg (ref 26.0–34.0)
MEAN CORPUSCULAR VOLUME: 100.2 fL — ABNORMAL HIGH (ref 80.0–100.0)
MEAN PLATELET VOLUME: 8.2 fL (ref 7.0–10.0)
MONOCYTES ABSOLUTE COUNT: 0.4 10*9/L (ref 0.2–0.8)
NEUTROPHILS ABSOLUTE COUNT: 4.8 10*9/L (ref 2.0–7.5)
RED BLOOD CELL COUNT: 3.47 10*12/L — ABNORMAL LOW (ref 4.00–5.20)
RED CELL DISTRIBUTION WIDTH: 19.1 % — ABNORMAL HIGH (ref 12.0–15.0)
WBC ADJUSTED: 6.4 10*9/L (ref 4.5–11.0)

## 2016-12-07 LAB — COMPREHENSIVE METABOLIC PANEL
ALBUMIN: 3.8 g/dL (ref 3.5–5.0)
ALKALINE PHOSPHATASE: 224 U/L — ABNORMAL HIGH (ref 38–126)
ALT (SGPT): 31 U/L (ref 15–48)
ANION GAP: 11 mmol/L (ref 9–15)
AST (SGOT): 45 U/L — ABNORMAL HIGH (ref 14–38)
BILIRUBIN TOTAL: 0.4 mg/dL (ref 0.0–1.2)
BLOOD UREA NITROGEN: 28 mg/dL — ABNORMAL HIGH (ref 7–21)
CALCIUM: 8.7 mg/dL (ref 8.5–10.2)
CHLORIDE: 106 mmol/L (ref 98–107)
CO2: 19 mmol/L — ABNORMAL LOW (ref 22.0–30.0)
CREATININE: 1.38 mg/dL — ABNORMAL HIGH (ref 0.60–1.00)
EGFR MDRD AF AMER: 47 mL/min/{1.73_m2} — ABNORMAL LOW (ref >=60)
EGFR MDRD NON AF AMER: 38 mL/min/{1.73_m2} — ABNORMAL LOW (ref >=60)
GLUCOSE RANDOM: 103 mg/dL (ref 65–179)
POTASSIUM: 5 mmol/L (ref 3.5–5.0)
PROTEIN TOTAL: 7.5 g/dL (ref 6.5–8.3)
SODIUM: 136 mmol/L (ref 135–145)

## 2016-12-07 LAB — SMEAR REVIEW

## 2016-12-07 LAB — LACTATE DEHYDROGENASE: Lactate dehydrogenase:CCnc:Pt:Ser/Plas:Qn:: 380

## 2016-12-07 LAB — URIC ACID: Urate:MCnc:Pt:Ser/Plas:Qn:: 8.7 — ABNORMAL HIGH

## 2016-12-07 LAB — CREATINE KINASE TOTAL: Creatine kinase:CCnc:Pt:Ser/Plas:Qn:: 66

## 2016-12-07 LAB — PHOSPHORUS: Phosphate:MCnc:Pt:Ser/Plas:Qn:: 4.8 — ABNORMAL HIGH

## 2016-12-07 LAB — POTASSIUM: Potassium:SCnc:Pt:Ser/Plas:Qn:: 5

## 2016-12-07 MED ORDER — HYDROCODONE 5 MG-ACETAMINOPHEN 325 MG TABLET
ORAL_TABLET | Freq: Four times a day (QID) | ORAL | 0 refills | 0 days | Status: CP
Start: 2016-12-07 — End: 2017-01-12

## 2016-12-07 MED ORDER — LORAZEPAM 0.5 MG TABLET
ORAL_TABLET | 0 refills | 0 days | Status: CP
Start: 2016-12-07 — End: 2017-01-12

## 2016-12-07 MED FILL — ALECENSA/150MG/CAPS: ALECENSA/150MG/CAPS | 30 days supply | Qty: 240 | Fill #2

## 2016-12-07 NOTE — Unmapped (Signed)
It was a pleasure to see you today.      Please hold your lasix and potassium pills.   If you start to develop swelling of upper or lower extremities then ok to restart.     Please call our nurse navigator if you have any interval questions or concerns:    For appointments, please call [phone].  For [facility] pharmacy, please call [phone].    Nurse Navigator:  Nurse Triage Line: [phone]  Greer Pickerel, RN: [email or web address]      For emergencies, evenings or weekends, please call [phone] and ask for oncology fellow on call.    Reasons to call emergency line may include:  Fever of 100.5 or greater  Nausea and/or vomiting not relieved with nausea medicine  Diarrhea or constipation not relieved with bowel regimen  Severe pain not relieved with usual pain regimen      Terance Hart, PA  Medical Oncology  [facility] Hematology/Oncology  [address]  [place], [place] [address]  Fax: [phone]      Lab on 12/07/2016   Component Date Value Ref Range Status   ??? Sodium 12/07/2016 136  135 - 145 mmol/L Final   ??? Potassium 12/07/2016 5.0  3.5 - 5.0 mmol/L Final   ??? Chloride 12/07/2016 106  98 - 107 mmol/L Final   ??? CO2 12/07/2016 19.0* 22.0 - 30.0 mmol/L Final   ??? BUN 12/07/2016 28* 7 - 21 mg/dL Final   ??? Creatinine 12/07/2016 1.38* 0.60 - 1.00 mg/dL Final   ??? BUN/Creatinine Ratio 12/07/2016 20   Final   ??? EGFR MDRD Non Af Amer 12/07/2016 38* >=60 mL/min/1.60m2 Final   ??? EGFR MDRD Af Amer 12/07/2016 47* >=60 mL/min/1.51m2 Final   ??? Anion Gap 12/07/2016 11  9 - 15 mmol/L Final   ??? Glucose 12/07/2016 103  65 - 179 mg/dL Final   ??? Calcium 40/34/7425 8.7  8.5 - 10.2 mg/dL Final   ??? Albumin 95/63/8756 3.8  3.5 - 5.0 g/dL Final   ??? Total Protein 12/07/2016 7.5  6.5 - 8.3 g/dL Final   ??? Total Bilirubin 12/07/2016 0.4  0.0 - 1.2 mg/dL Final   ??? AST 43/32/9518 45* 14 - 38 U/L Final   ??? ALT 12/07/2016 31  15 - 48 U/L Final   ??? Alkaline Phosphatase 12/07/2016 224* 38 - 126 U/L Final   ??? WBC 12/07/2016 6.4 4.5 - 11.0 10*9/L Final   ??? RBC 12/07/2016 3.47* 4.00 - 5.20 10*12/L Final   ??? HGB 12/07/2016 11.2* 12.0 - 16.0 g/dL Final   ??? HCT 84/16/6063 34.8* 36.0 - 46.0 % Final   ??? MCV 12/07/2016 100.2* 80.0 - 100.0 fL Final   ??? MCH 12/07/2016 32.2  26.0 - 34.0 pg Final   ??? MCHC 12/07/2016 32.1  31.0 - 37.0 g/dL Final   ??? RDW 01/60/1093 19.1* 12.0 - 15.0 % Final   ??? MPV 12/07/2016 8.2  7.0 - 10.0 fL Final   ??? Platelet 12/07/2016 237  150 - 440 10*9/L Final   ??? Absolute Neutrophils 12/07/2016 4.8  2.0 - 7.5 10*9/L Final   ??? Absolute Lymphocytes 12/07/2016 1.1* 1.5 - 5.0 10*9/L Final   ??? Absolute Monocytes 12/07/2016 0.4  0.2 - 0.8 10*9/L Final   ??? Absolute Eosinophils 12/07/2016 0.1  0.0 - 0.4 10*9/L Final   ??? Absolute Basophils 12/07/2016 0.0  0.0 - 0.1 10*9/L Final   ??? Large Unstained Cells 12/07/2016 2  0 - 4 % Final   ???  Macrocytosis 12/07/2016 Marked* Not Present Final   ??? Anisocytosis 12/07/2016 Moderate* Not Present Final   ??? Hypochromasia 12/07/2016 Slight* Not Present Final   ??? Smear Review Comments 12/07/2016 See Comment* Undefined Final

## 2016-12-07 NOTE — Unmapped (Signed)
REFERRING PROVIDER:   Rae Lips, Fnp  [address]  [address]  [facility]  [place], [place] [address]    PRIMARY CARE PROVIDER:  Quentin Ore, FNP  [address] [facility]  [place] [address]    CONSULTING PROVIDERS:  Dr. Jackelyn Poling    DIAGNOSIS: Stage IV adenoCA Lung  MOLECULAR PROFILE: +ALK Re-arrangment  CURRENT TREATMENT: alectinib since Aug 2017  PREVIOUS TREATMENT: crizotinib x107mos  REASON FOR VISIT: [name], a 66 y.o. female is seen today for further mgmt of stage IV adenocarcinoma of the lung.    ASSESSMENT/PLAN  1. Stage IV adenocarcinoma of the lung; ALK+.  Metastases to bilateral supraclavicular, mediastinal, and R hilar node, T7 and T10, and right pleural fluid. CT scans on 11/03/2015 indicated progression of disease.  Switched therapy to alectinib and it is going well so far. CT scans from 01/18/16 indicate response. CT scans from 07/11/2016 which showed stable disease.  There was a indeterminate sclerotic lesion in the sternum which was present on the 11/03/2015 scan.  Scans on 10/10/16 show no new concerning lesions with ongoing decrease in right lower lobe nodule.    Recent episode of LOC of unclear etiology. Possible dehydration, but cannot r/o seizure, brain mets, other etiologies as no eval was completed following event. Pt with no recurrent sx., but appears dry today. Will give IVF's and ask her to hold her lasix and potassium. Ok to restart if she develops recurrent edema in LE/UE. Will order brain MRI for further eval. Given her prior h/o rhabdomyolysis, will add additional labs.       Will continue alectinib for now. If LFT's do not improve, will plan to repeat CT sooner given new/enlarging liver lesions on recent scan. Reviewed these findings in detail with the patient today.     We'll repeat scans at the subsequent follow up, around October/November 2018.    2. AKI: Cr elevated at hospital discharge on 5/20.  BUN/Cr increased today. Will give IVF's today and have her hold lasix and potassium for now. Will repeat labs at next visit.     3. Ulcerated lipodermatosclerosis of LLE;  Likely 2/2 chronic venous insufficiency.  Being followed closely by wound care.    4. HTN:  Cont current meds.  PCP will continue to assist w/ management     5. Other medical problems: Rhabdomyolysis likely secondary to daptomycin, acute kidney injury secondary to rhabdo and HTN (resolved).    6. Depression. Appreciate CCSP support.  A[facility] following.  Previously improved with anti-depressant, will continue this today. She also uses ativan prn.    7. Pain: cont hydrocodone/APAP prn. Refilled today. Neuropathic in nature and mostly at night. Improved significantly with gabapentin qhs.        HISTORY OF PRESENT ILLNESS: [name] presents today as a new patient for further evaluation of stage IV adenocarcinoma of the lung. Please see below in oncology history for further details. In summary, patient presented with nausea and vomiting, and underwent workup including CT scan of the abdomen, which revealed 2.8 cm lung nodule in the right lower lung base. Further staging workup including PET CT scan revealed R lung nodules/masses. Bilateral supraclavicular, mediastinal, and R hilar node mets. Bone mets T7 and T10. Mod sized R pleural effusion, small L pleural effusion. Thoracentesis of right pleural effusion showed malignant cells. Right subclavian lymph node FNA also showed pathologic findings consistent with adenocarcinoma  of lung primary. The patient visited cancer Hospital today for medical and radiation oncology evaluation.    Patient was previously treated for suspected cellulitis of the left lower extremity at [facility] from 10/12-10/27/16 and was discharged on IV daptomycin, IV ertapenem, and Pulse Vac therapy. She was readmitted on 02/01/15 due to weakness and rhabdomyolysis likely secondary to daptomycin. On 02/04/15 she was transferred to [facility] for further management. She was initially started on IV Zyvox and continued on the IV Invanz. Dermatology was consulted and the patient's presentation was thought to be more consistent with lipodermatosclerosis. Tissue biopsy was consistent with lipodermatosclerosis secondary to chronic venous stasis.    INTERVAL HX:     Unaccompanied to today's visit. She continues on alectinib with no new side effects. Reports an episode of LOC last Wednesday. She states that her husband found her sitting on the steps passed out for an unknown period of time. She denies any dizziness or lightheadedness prior to event. She recalls very little from the episode, but does note that her husband helped her to the couch to rest. She reports feeling disoriented and fatigued for a time after. She also reports an episode on N/V the following morning. She states that they had company at the house the weekend prior and they had left on Tuesday. She had been very busy and more active while they had guests. Admits that she may not have been watching her fluid and food intake well. She also recently completed a course of antibiotics with amoxicillin on Friday. She had experienced some mild intermittent diarrhea while taking the pills, but this has since resolved. No change in breathing. States that LLE wound is healing slowly. No fevers/chills/sweats. No headaches or new neuro sx.       Oncology History    11        Primary adenocarcinoma of right lung (CMS-HCC)    01/14/2015 -  Presenting Symptoms     Presented with nausea, vomiting and diarrhea. Underwent abdominal CT scan, which incidentally found 2.8 cm nodule in the right lung base with multiple w/ additional sub cm ground-glass nodules.         01/16/2015 Biopsy     EBUS Bx of RLL - negative for malignancy         02/05/2015 -  Cancer Staged     PET/CT : FDG avid R lung nodules/masses. Bilateral supraclavicular, mediastinal, and R hilar node mets. Bone mets T7 and T10. Mod sized R pleural effusion, small L pleural effusion. Mild FDG avid b/l iliac, inguinal, femoral LNs - likely benign.         02/09/2015 Procedure     R thoracentesis 750cc removed - positive for malignancy.          02/09/2015 Biopsy     R supraclavicular LN FNA - positive for malignancy.            PAST MEDICAL HISTORY  Past Medical History:   Diagnosis Date   ??? Asthma    ??? Cellulitis    ??? Diverticulitis    ??? Hypertension 04/27/2015   ??? Lung cancer, lower lobe (CMS-HCC)     RIGHT   ??? Renal failure     AKI with failure now resolved   ??? Seasonal allergies    ??? Shingles        Allergies   Allergen Reactions   ??? Daptomycin Other (See Comments)     Rhabdomyolysis   ??? Dilaudid [Hydromorphone] Nausea And  Vomiting       MEDICATIONS  Current Outpatient Prescriptions on File Prior to Visit   Medication Sig Dispense Refill   ??? albuterol (PROAIR HFA) 90 mcg/actuation inhaler Inhale 2 puffs every six (6) hours as needed for wheezing. 1 Inhaler 3   ??? ALECENSA 150 mg capsule TAKE 4 CAPSULES (600 MG) BY MOUTH TWICE DAILY WITH MEALS 240 capsule 11   ??? cetirizine (ZYRTEC) 10 MG tablet Take 10 mg by mouth nightly.      ??? elastic bandage (SUREPRESS HIGH COMPRESSION) 4 X 3.2 -yard [employer] Apply 1 application topically daily. Apply as directed; remove at night 1 each 0   ??? foam bandage 2  Bndg Apply 1 application topically daily. 1 each 0   ??? furosemide (LASIX) 20 MG tablet TAKE 1 TABLET(20 MG) BY MOUTH TWICE DAILY 60 tablet 6   ??? gabapentin (NEURONTIN) 300 MG capsule Take 3 capsules (900 mg total) by mouth nightly. 90 capsule 5   ??? potassium chloride 20 mEq TbER Take 20 mEq by mouth Two (2) times a day. 60 tablet 0   ??? prochlorperazine (COMPAZINE) 10 MG tablet TAKE 1 TABLET BY MOUTH EVERY 6 HOURS AS NEEDED FOR NAUSEA 60 tablet 3   ??? ranitidine (ZANTAC) 150 MG tablet Take 150 mg by mouth continuous as needed for heartburn.     ??? sertraline (ZOLOFT) 100 MG tablet TAKE 1 AND 1/2 TABLETS BY MOUTH EVERY DAY 45 tablet 6   ??? triamcinolone (KENALOG) 0.1 % cream Apply topically Two (2) times a day. 85 g 2   ??? VASHE WOUND THERAPY 0.033 % IrSl IRRIGATE WITH 16 OZ AS DIRECTED TWICE DAILY 3000 mL 0   ??? [DISCONTINUED] HYDROcodone-acetaminophen (NORCO) 5-325 mg per tablet Take 1 tablet by mouth Every six (6) hours. 120 tablet 0   ??? lisinopril (PRINIVIL,ZESTRIL) 10 MG tablet Take 1 tablet (10 mg total) by mouth daily. 90 tablet 3   ??? [DISCONTINUED] LORazepam (ATIVAN) 0.5 MG tablet Take 1-2 po QHS or as needed 42 tablet 0     No current facility-administered medications on file prior to visit.        FAMILY HISTORY  Cancer-related family history includes Breast cancer in her mother.    SOCIAL HISTORY  she is a never smoker, no Etoh.    - Married, mother of 2. 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     PHYSICAL EXAM  ECOG Performance Status: 1  Vitals:    12/07/16 1539   BP: 141/67   Pulse: 87   Resp: 16   Temp: 36.9 ??C (98.5 ??F)   SpO2: 94%     GEN: Awake and alert, pleasant appearing female in no acute distress  HEENT: [place]/AT. Pupils equally round without scleral icterus. EOMI. OP clear.   LYMPH: No palpable cervical or clavicular lymph nodes  CV: Regular rhythm with normal rate, no murmurs  LUNGS: Clear to auscultation bilaterally without wheeze or rhonchi, slight decrease in right base.  SKIN: No rashes or lesions over forearms, face, neck  ABD: Soft and non-tender with no distention or palpable masses. Bowel sounds are active  [facility]: Alert and oriented to person, place and time  EXT: LLE wrapped post recent skin transplant, RLE w/no pitting edema.  NEURO: CN II-XII intact. Gait stable with mild instability, needing  minimal assistance getting onto exam table. Speech intact. Strength 5/5 in extremities bil.     DIAGNOSTIC DATA: I have personally reviewed the patient's records in EPIC including but not limited to clinic notes, admission notes, operative reports, pathology reports, lab results and imaging results.     RADIOLOGY RESULTS: I have personally reviewed the patient's CT scan or PET scan on PACS that shows the following results.     CT C/A/P 10/10/2016  Impression Mild interval decrease in the size of 1.1 cm right lower lobe nodule, previously measured 1.5 cm on 07/11/2016.    Multiple bibasilar micronodules, likely mucoid impacted airways.        10/10/16  FINDINGS:   CHEST:  AIRWAYS:  Clear central airways.    Bibasilar paravertebral linear opacities, likely scarring.    Interval decrease in the size of right lower lobe nodule measuring 1.1 cm (series 3, image 63), previously measured 1.5 cm when remeasured in a similar manner and location. Few bibasilar micronodules, likely mucoid impacted airways.    No pleural effusion.    MEDIASTINUM:    Normal heart size. No pericardial effusion.    Normal caliber thoracic aorta.    No lymphadenopathy.    ABDOMEN/PELVIS:  HEPATOBILIARY: New low-attenuation lesions in the right hepatic lobe measuring approximately 1.7 cm (1:28) and 1.1 cm (1:20). Diffuse hepatic steatosis.  The spleen, pancreas, right lateral adrenal glands are unremarkable.  KIDNEYS/URETERS: Subcentimeter low-attenuation lesion in left kidney, too small to characterize by CT but likely simple cyst.  BLADDER: Unremarkable.  BOWEL/PERITONEUM/RETROPERITONEUM: No bowel obstruction. No acute inflammatory process. No ascites. Diverticulosis without diverticulitis.Small fat-containing umbilical hernia.  VASCULATURE: Abdominal aorta within normal limits for patient's age. Unremarkable inferior vena cava.  LYMPH NODES: No adenopathy.  REPRODUCTIVE ORGANS: Unremarkable.    BONES/SOFT TISSUES: Right Multilevel degenerative changes of the spine.     ??      Impression       Mild interval decrease in the size of 1.1 cm right lower lobe nodule, previously measured 1.5 cm on 07/11/2016.    Multiple bibasilar micronodules, likely mucoid impacted airways.          CT chest/abdomen 07/11/2016  Impression     Right lower lobe pulmonary nodule, grossly unchanged.       CT chest abdomen and pelvis 04/11/2016  Impression     Grossly unchanged spiculated nodule in the right lower lobe. No new lung lesions identified.    Subcentimeter sclerotic lesion in the sternum, not seen on the chest CT dated 01/15/2015, indeterminate. Attention on follow-up.         Brain MRI 11/03/15:     Impression     No evidence of metastatic disease to the brain.    New focus of signal abnormality within the right precentral gyrus without enhancement which may represent an interval, age-indeterminate infarct.       CT 01/18/16:  Impression     -Decreasing fissural nodularity about the right major minor fissures, decreasing left subpectoral/axillary lymphadenopathy, as well as, decrease in now trace right pleural effusion and pericardial effusion.   -Stable right lower lobe lesion. No new sites of disease identified.         PATHOLOGY:  02/09/2015  Final Diagnosis     A: Lymph node, right supraclavicular, fine needle aspiration  - Malignant cells present  - Consistent with metastatic lung adenocarcinoma (see comment)   Immunohistochemical stains were performed and tumor cells are diffusely positive for TTF-1 and negative for p40,  supporting the diagnosis of metastatic lung adenocarcinoma. Molecular testing will be performed and results will be reported separately.    02/09/2015  A. Pleural fluid, right, thoracentesis  - Malignant cells present  - Consistent with patient's known metastatic lung adenocarcinoma.    MOLECULAR TESTING:  +ALK re-arrangement on in house cytogenetics.

## 2016-12-07 NOTE — Unmapped (Signed)
[facility]  Comprehensive Cancer Support Program/Psychiatry   Established Patient E&M Service and Psychotherapy      Name: Angelica [name]  Date: 12/07/2016  MRN: [id number]  DOB: 05/16/1950  PCP: Quentin Ore, FNP  Oncologist: Dr. Nedra Hai       Assessment:  Angelica [name] is a 66 y.o., female?? with a history of stage IV adenocarcinoma of the lung, referred by her oncology team for evaluation of current sadness and anxiety.?? She initially ??had a series of serious medical diagnoses as well as setbacks but she eventually recovered. Then she had some progression but for many months now, her cancer has been stable.??She continues to stay active and positive and she acknowledges that the sertraline has helped her overall. Her mood is stable on??sertraline to 150 mg; yet she still  needs??lorazepam nightly for sleep.  Her wound is healing better, however she has had some recent DOE which she is somewhat concerned about. ??We will continue to follow her closely.   ??  Risk Assessment:  A suicide and violence risk assessment was performed as part of this evaluation. There patient is deemed to be at chronic elevated risk for self-harm/suicide given the following factors: recent onset of serious medical condition and current diagnosis of depression. There patient is deemed to be at chronic elevated risk for violence given the following factors: N/A. These risk factors are mitigated by the following factors:lack of active SI/HI, no known access to weapons or firearms, no history of previous suicide attempts , no history of violence, motivation for treatment, utilization of positive coping skills, supportive family, sense of responsibility to family and social supports, presence of a significant relationship, presence of an available support system, employment or functioning in a structured work/academic setting, enjoyment of leisure actvities, expresses purpose for living, religious or spiritual prohibition to suicide/violence, current treatment compliance, effective problem solving skills, safe housing, support system in agreement with treatment recommendations and presence of a safety plan with follow-up care. There is no acute risk for suicide or violence at this time. The patient was educated about relevant modifiable risk factors including following recommendations for treatment of psychiatric illness and abstaining from substance abuse. While future psychiatric events cannot be accurately predicted, the patient does not currently require?? acute inpatient psychiatric care and does not currently meet [facility] involuntary commitment criteria.??????      Diagnoses:   Patient Active Problem List   Diagnosis   ??? Hypokalemia   ??? Cellulitis of left lower extremity   ??? Post-herpetic polyneuropathy   ??? Diarrhea   ??? Malnutrition (CMS-HCC)   ??? Anemia   ??? Weakness   ??? Acute cystitis without hematuria   ??? Non-traumatic rhabdomyolysis   ??? Elevated LFTs   ??? Primary adenocarcinoma of right lung (CMS-HCC)   ??? Hypoalbuminemia   ??? Need for pneumococcal vaccination   ??? Leg ulcer, left (CMS-HCC)   ??? Hypertension   ??? Vasculitis determined by biopsy of skin (CMS-HCC)   ??? Dermatitis   ??? Lymphedema of left leg   ??? Risk for falls   ??? Elevated serum creatinine   ??? S/P split thickness skin graft   ??? Colitis due to Clostridium difficile   ??? Skin ulcer of left great toe, limited to breakdown of skin (CMS-HCC)        Stressors: metastatic cancer  Disability Assessment Scale: WHODAS clinically estimated as 30-40         Plan:  .#Depressive episode, improved ????  -  Continue sertraline??150 mg  - Continue lorazepam 0.5 mg QHS  - Provided supportive and problem-solving psychotherapy to address her anxiety    # Insomnia, ongoing   - Continue lorazepam 0.5 mg QHS  - Pt has gone without for a night and she cannot sleep   - Reviewed sleep hygiene and discussed risks of being on for long term     Patient has been given this writer's business card with confidential voicemail number. She has been instructed to call [phone] for emergencies.  Follow-up with me in 4 weeks.    Revised Medication(s) Post Visit:  Outpatient Encounter Prescriptions as of 12/07/2016   Medication Sig Dispense Refill   ??? albuterol (PROAIR HFA) 90 mcg/actuation inhaler Inhale 2 puffs every six (6) hours as needed for wheezing. 1 Inhaler 3   ??? ALECENSA 150 mg capsule TAKE 4 CAPSULES (600 MG) BY MOUTH TWICE DAILY WITH MEALS 240 capsule 11   ??? cetirizine (ZYRTEC) 10 MG tablet Take 10 mg by mouth nightly.      ??? elastic bandage (SUREPRESS HIGH COMPRESSION) 4 X 3.2 -yard [employer] Apply 1 application topically daily. Apply as directed; remove at night 1 each 0   ??? foam bandage 2  Bndg Apply 1 application topically daily. 1 each 0   ??? furosemide (LASIX) 20 MG tablet TAKE 1 TABLET(20 MG) BY MOUTH TWICE DAILY 60 tablet 6   ??? gabapentin (NEURONTIN) 300 MG capsule Take 3 capsules (900 mg total) by mouth nightly. 90 capsule 5   ??? HYDROcodone-acetaminophen (NORCO) 5-325 mg per tablet Take 1 tablet by mouth Every six (6) hours. 120 tablet 0   ??? lisinopril (PRINIVIL,ZESTRIL) 10 MG tablet Take 1 tablet (10 mg total) by mouth daily. 90 tablet 3   ??? LORazepam (ATIVAN) 0.5 MG tablet Take 1-2 po QHS or as needed 42 tablet 0   ??? potassium chloride 20 mEq TbER Take 20 mEq by mouth Two (2) times a day. 60 tablet 0   ??? prochlorperazine (COMPAZINE) 10 MG tablet TAKE 1 TABLET BY MOUTH EVERY 6 HOURS AS NEEDED FOR NAUSEA 60 tablet 3   ??? ranitidine (ZANTAC) 150 MG tablet Take 150 mg by mouth continuous as needed for heartburn.     ??? sertraline (ZOLOFT) 100 MG tablet TAKE 1 AND 1/2 TABLETS BY MOUTH EVERY DAY 45 tablet 6   ??? triamcinolone (KENALOG) 0.1 % cream Apply topically Two (2) times a day. 85 g 2   ??? VASHE WOUND THERAPY 0.033 % IrSl IRRIGATE WITH 16 OZ AS DIRECTED TWICE DAILY 3000 mL 0     No facility-administered encounter medications on file as of 12/07/2016.        Subjective:   She had a syncope episode a week ago. She was climbing the stairs to her bedroom and she passed out. When she awoke, she felt disoriented and so she slept on the couch. She did not seek medical care at that time. She is worried about it and she does not know why it occurred. Otherwise, she has been doing pretty well. She has had company over a few days prior but she did not feel overwhelmed or stressed. However, she notes that she may not have been keeping good track of her fluid intake. She enjoyed the company. She is using the lorazepam nightly and she feels that she needs that to sleep. He mood is good and she denies depression or anxiety. Her wound is finally healing pretty well so she is glad about that. She  denies mania or SI.     Medications/Allergies: reviewed    Medical History/Surgical History/Social history:reviewed    Medication(s) on Presentation:   Outpatient Medications Prior to Visit   Medication Sig Dispense Refill   ??? albuterol (PROAIR HFA) 90 mcg/actuation inhaler Inhale 2 puffs every six (6) hours as needed for wheezing. 1 Inhaler 3   ??? ALECENSA 150 mg capsule TAKE 4 CAPSULES (600 MG) BY MOUTH TWICE DAILY WITH MEALS 240 capsule 11   ??? cetirizine (ZYRTEC) 10 MG tablet Take 10 mg by mouth nightly.      ??? elastic bandage (SUREPRESS HIGH COMPRESSION) 4 X 3.2 -yard [employer] Apply 1 application topically daily. Apply as directed; remove at night 1 each 0   ??? foam bandage 2  Bndg Apply 1 application topically daily. 1 each 0   ??? furosemide (LASIX) 20 MG tablet TAKE 1 TABLET(20 MG) BY MOUTH TWICE DAILY 60 tablet 6   ??? gabapentin (NEURONTIN) 300 MG capsule Take 3 capsules (900 mg total) by mouth nightly. 90 capsule 5   ??? HYDROcodone-acetaminophen (NORCO) 5-325 mg per tablet Take 1 tablet by mouth Every six (6) hours. 120 tablet 0   ??? lisinopril (PRINIVIL,ZESTRIL) 10 MG tablet Take 1 tablet (10 mg total) by mouth daily. 90 tablet 3   ??? LORazepam (ATIVAN) 0.5 MG tablet Take 1-2 po QHS or as needed 42 tablet 0   ??? potassium chloride 20 mEq TbER Take 20 mEq by mouth Two (2) times a day. 60 tablet 0   ??? prochlorperazine (COMPAZINE) 10 MG tablet TAKE 1 TABLET BY MOUTH EVERY 6 HOURS AS NEEDED FOR NAUSEA 60 tablet 3   ??? ranitidine (ZANTAC) 150 MG tablet Take 150 mg by mouth continuous as needed for heartburn.     ??? sertraline (ZOLOFT) 100 MG tablet TAKE 1 AND 1/2 TABLETS BY MOUTH EVERY DAY 45 tablet 6   ??? triamcinolone (KENALOG) 0.1 % cream Apply topically Two (2) times a day. 85 g 2   ??? VASHE WOUND THERAPY 0.033 % IrSl IRRIGATE WITH 16 OZ AS DIRECTED TWICE DAILY 3000 mL 0     No facility-administered medications prior to visit.        ROS: The balance of 10 systems is negative except for the following: none     Objective:    Vitals:   There were no vitals filed for this visit.    Mental Status Exam:  Appearance:    Appears older than stated age   Motor:   No abnormal movements   Speech/Language:    Normal rate, volume, tone, fluency   Mood:   good    Affect:   calm, cooperative,    Thought process:   Logical, linear, clear, coherent, goal directed   Thought content:     Denies SI, HI, self harm, delusions, obsessions, paranoid ideation, or ideas of reference   Perceptual disturbances:     Denies auditory and visual hallucinations, behavior not concerning for response to internal stimuli     Orientation:   Oriented to person, place, time, and general circumstances   Attention:   Able to fully attend without fluctuations in consciousness   Concentration:   Able to fully concentrate and attend   Memory:   Immediate, short-term, long-term, and recall grossly intact    Fund of knowledge:    Consistent with level of education and development   Insight:     Intact   Judgment:    Intact   Impulse Control:  Intact     PE:   Gen: in NAD  Neuro: Cranial nerves II-XII grossly intact, antalgic gait, no tremor observed.     Test Results:  Data Review: Lab results last 24 hours:  No results found for this or any previous visit (from the past 24 hour(s)).  Imaging: none     Marc Morgans, [place]   12/07/2016

## 2016-12-07 NOTE — Unmapped (Deleted)
REFERRING PROVIDER:   Rae Lips, Fnp  [address]  [address]  [facility]  [place], [place] [address]    PRIMARY CARE PROVIDER:  Quentin Ore, FNP  [address] [facility]  [place] [address]    CONSULTING PROVIDERS:  Dr. Jackelyn [name]    DIAGNOSIS: Stage IV adenoCA Lung  MOLECULAR PROFILE: +ALK Re-arrangment  CURRENT TREATMENT: alectinib since Aug 2017  PREVIOUS TREATMENT: crizotinib x89mos  REASON FOR VISIT: [name], a 66 y.o. female is seen today for further mgmt of stage IV adenocarcinoma of Angelica lung.    ASSESSMENT/PLAN  1. Stage IV adenocarcinoma of Angelica lung; ALK+.  Metastases to bilateral supraclavicular, mediastinal, and R hilar node, T7 and T10, and right pleural fluid. CT scans on 11/03/2015 indicated progression of disease.  Switched therapy to alectinib and it is going well so far. CT scans from 01/18/16 indicate response. CT scans from 07/11/2016 which showed stable disease.  There was a indeterminate sclerotic lesion in Angelica sternum which was present on Angelica 11/03/2015 scan.  Scans on 10/10/16 show no new concerning lesions with ongoing decrease in right lower lobe nodule    No new or concerning symptoms today, so we will continue on Angelica alectinib and she'll see me back in 6 weeks for lab work and tox check.  We'll repeat scans at [facility] subsequent follow up, around October/November 2018.    2. AKI: Cr elevated at hospital discharge on 5/20.  Will follow up on repeat labs today.    3. Ulcerated lipodermatosclerosis of LLE;  Likely 2/2 chronic venous insufficiency.  Being followed closely by wound care.    4. HTN:  Cont current meds.  PCP will continue to assist w/ management     5. Other medical problems: Rhabdomyolysis likely secondary to daptomycin, acute kidney injury secondary to rhabdo and HTN (resolved).    6. Depression. Appreciate [facility] support.  Angelica [name] following.  Previously improved with anti-depressant, will continue this today. She also uses ativan prn.    7. Pain: cont hydrocodone/APAP prn. Refilled today. Neuropathic in nature and mostly at night. Improved significantly with gabapentin qhs.        HISTORY OF PRESENT ILLNESS: [name] presents today as a new [name] for further evaluation of stage IV adenocarcinoma of Angelica lung. Please see below in oncology history for further details. In summary, [name] presented with nausea and vomiting, and underwent workup including CT scan of Angelica abdomen, which revealed 2.8 cm lung nodule in Angelica right lower lung base. Further staging workup including PET CT scan revealed R lung nodules/masses. Bilateral supraclavicular, mediastinal, and R hilar node mets. Bone mets T7 and T10. Mod sized R pleural effusion, small L pleural effusion. Thoracentesis of right pleural effusion showed malignant cells. Right subclavian lymph node FNA also showed pathologic findings consistent with adenocarcinoma of lung primary. Angelica [name] visited cancer Hospital today for medical and radiation oncology evaluation.    [name] was previously treated for suspected cellulitis of Angelica left lower extremity at [facility] from 10/12-10/27/16 and was discharged on IV daptomycin, IV ertapenem, and Pulse Vac therapy. She was readmitted on 02/01/15 due to weakness and rhabdomyolysis likely secondary to daptomycin. On 02/04/15 she was transferred to [facility] for further management. She was initially started on IV Zyvox and continued on Angelica IV Invanz. Dermatology was consulted and Angelica [name]'s presentation was thought to be more consistent with lipodermatosclerosis.  Tissue biopsy was consistent with lipodermatosclerosis secondary to chronic venous stasis.    INTERVAL HX:     Today she is doing well on alectinib. No new issues with obtaining or taking drug.  No new complaints, although she continues to have some degree of ongoing fatigue which she attributes to Angelica alectinib.  Does not limit daily activity.  Denies nausea, vomiting, fevers, chills, or worsening dyspnea on exertion. No new CNS symptoms. Mood has been stable.      Oncology History    11        Primary adenocarcinoma of right lung (CMS-HCC)    01/14/2015 -  Presenting Symptoms     Presented with nausea, vomiting and diarrhea. Underwent abdominal CT scan, which incidentally found 2.8 cm nodule in Angelica right lung base with multiple w/ additional sub cm ground-glass nodules.         01/16/2015 Biopsy     EBUS Bx of RLL - negative for malignancy         02/05/2015 -  Cancer Staged     PET/CT : FDG avid R lung nodules/masses. Bilateral supraclavicular, mediastinal, and R hilar node mets. Bone mets T7 and T10. Mod sized R pleural effusion, small L pleural effusion. Mild FDG avid b/l iliac, inguinal, femoral LNs - likely benign.         02/09/2015 Procedure     R thoracentesis 750cc removed - positive for malignancy.          02/09/2015 Biopsy     R supraclavicular LN FNA - positive for malignancy.            PAST MEDICAL HISTORY  Past Medical History:   Diagnosis Date   ??? Asthma    ??? Cellulitis    ??? Diverticulitis    ??? Hypertension 04/27/2015   ??? Lung cancer, lower lobe (CMS-HCC)     RIGHT   ??? Renal failure     AKI with failure now resolved   ??? Seasonal allergies    ??? Shingles        Allergies   Allergen Reactions   ??? Daptomycin Other (See Comments)     Rhabdomyolysis   ??? Dilaudid [Hydromorphone] Nausea And Vomiting       MEDICATIONS  Current Outpatient Prescriptions on File Prior to Visit   Medication Sig Dispense Refill   ??? albuterol (PROAIR HFA) 90 mcg/actuation inhaler Inhale 2 puffs every six (6) hours as needed for wheezing. 1 Inhaler 3   ??? ALECENSA 150 mg capsule TAKE 4 CAPSULES (600 MG) BY MOUTH TWICE DAILY WITH MEALS 240 capsule 11   ??? cetirizine (ZYRTEC) 10 MG tablet Take 10 mg by mouth nightly.      ??? elastic bandage (SUREPRESS HIGH COMPRESSION) 4 X 3.2 -yard [employer] Apply 1 application topically daily. Apply as directed; remove at night 1 each 0   ??? foam bandage 2  Bndg Apply 1 application topically daily. 1 each 0   ??? furosemide (LASIX) 20 MG tablet TAKE 1 TABLET(20 MG) BY MOUTH TWICE DAILY 60 tablet 6   ??? gabapentin (NEURONTIN) 300 MG capsule Take 3 capsules (900 mg total) by mouth nightly. 90 capsule 5   ??? HYDROcodone-acetaminophen (NORCO) 5-325 mg per tablet Take 1 tablet by mouth Every six (6) hours. 120 tablet 0   ??? lisinopril (PRINIVIL,ZESTRIL) 10 MG tablet Take 1 tablet (10 mg total) by mouth daily. 90 tablet 3   ??? potassium chloride 20 mEq TbER Take 20 mEq by mouth Two (  2) times a day. 60 tablet 0   ??? prochlorperazine (COMPAZINE) 10 MG tablet TAKE 1 TABLET BY MOUTH EVERY 6 HOURS AS NEEDED FOR NAUSEA 60 tablet 3   ??? ranitidine (ZANTAC) 150 MG tablet Take 150 mg by mouth continuous as needed for heartburn.     ??? sertraline (ZOLOFT) 100 MG tablet TAKE 1 AND 1/2 TABLETS BY MOUTH EVERY DAY 45 tablet 6   ??? triamcinolone (KENALOG) 0.1 % cream Apply topically Two (2) times a day. 85 g 2   ??? VASHE WOUND THERAPY 0.033 % IrSl IRRIGATE WITH 16 OZ AS DIRECTED TWICE DAILY 3000 mL 0   ??? [DISCONTINUED] LORazepam (ATIVAN) 0.5 MG tablet Take 1-2 po QHS or as needed 42 tablet 0     No current facility-administered medications on file prior to visit.        FAMILY HISTORY  Cancer-related family history includes Breast cancer in her mother.    SOCIAL HISTORY  she is a never smoker, no Etoh.    - Married, mother of 2. 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     PHYSICAL EXAM  ECOG Performance Status: 1  There were no vitals filed for this visit.  GEN: Awake and alert, pleasant appearing female in no acute distress  HEENT: Pupils equally round without scleral icterus  LYMPH: No palpable cervical or clavicular lymph nodes  CV: Regular rhythm with normal rate, no murmurs  LUNGS: Clear to auscultation bilaterally without wheeze or rhonchi, slight decrease in right base.  SKIN: No rashes or lesions over forearms, face, neck  ABD: Soft and non-tender with no distention or palpable masses. Bowel sounds are active  [facility]: Alert and oriented to person, place and time  EXT: LLE wrapped post recent skin transplant, RLE w/no pitting edema.    DIAGNOSTIC DATA: I have personally reviewed Angelica [name]'s records in EPIC including but not limited to clinic notes, admission notes, operative reports, pathology reports, lab results and imaging results.     RADIOLOGY RESULTS: I have personally reviewed Angelica [name]'s CT scan or PET scan on PACS that shows Angelica following results.     CT C/A/P 10/10/2016  Impression Mild interval decrease in Angelica size of 1.1 cm right lower lobe nodule, previously measured 1.5 cm on 07/11/2016.    Multiple bibasilar micronodules, likely mucoid impacted airways.           CT chest/abdomen 07/11/2016  Impression     Right lower lobe pulmonary nodule, grossly unchanged.       CT chest abdomen and pelvis 04/11/2016  Impression     Grossly unchanged spiculated nodule in Angelica right lower lobe. No new lung lesions identified.    Subcentimeter sclerotic lesion in Angelica sternum, not seen on Angelica chest CT dated 01/15/2015, indeterminate. Attention on follow-up.         Brain MRI 11/03/15:     Impression     No evidence of metastatic disease to Angelica brain.    New focus of signal abnormality within Angelica right precentral gyrus without enhancement which may represent an interval, age-indeterminate infarct.       CT 01/18/16:  Impression     -Decreasing fissural nodularity about Angelica right major minor fissures, decreasing left subpectoral/axillary lymphadenopathy, as well as, decrease in now trace right pleural effusion and pericardial  effusion.   -Stable right lower lobe lesion. No new sites of disease identified.         PATHOLOGY:  02/09/2015  Final Diagnosis     A: Lymph node, right supraclavicular, fine needle aspiration  - Malignant cells present  - Consistent with metastatic lung adenocarcinoma (see comment)   Immunohistochemical stains were performed and tumor cells are diffusely positive for TTF-1 and negative for p40, supporting Angelica diagnosis of metastatic lung adenocarcinoma. Molecular testing will be performed and results will be reported separately.    02/09/2015  A. Pleural fluid, right, thoracentesis  - Malignant cells present  - Consistent with [name]'s known metastatic lung adenocarcinoma.    MOLECULAR TESTING:  +ALK re-arrangement on in house cytogenetics.

## 2016-12-08 NOTE — Unmapped (Signed)
Comprehensive Cancer Support Program - Psychiatry Outpatient Clinic   After Visit Summary    Continue these medicines: sertraline (zoloft)     Medication changes:none today    Our next appointment is next week     [facility]: [phone].     If you are taking any controlled substances (such as anxiety or sleep medications), you must use them as the directions say to use them. We cannot provide early refills, and it would be inappropriate to obtain the medications from other doctors. We routinely use the [place] controlled substance database to monitor prescription drug use.     If you need to get in touch with me for a non-urgent issue, the best way to do so is through sending a [employer] using the Patient Advice Request.    You can also contact Myrene Galas, the [facility] Clinic Coordinator, at [phone] for any scheduling issues, or if you have an issue during business hours.    In order to receive updates on clinic closings due to adverse weather,   please call [phone].    The [place] crisis psychiatry line can be reached for after hours urgent issues at:   [phone]. You can also call the I need to talk line-- [phone]-TALK ([phone]) connects you to a skilled, trained counselor at a crisis center in your area, anytime 24/7.

## 2016-12-08 NOTE — Unmapped (Signed)
[employer] approved until 12/07/17 for $0

## 2016-12-08 NOTE — Unmapped (Signed)
Pt A&O x4, NAD.  Pt denies any complaints since last visit.  Resting comfortably.    Pt tolerated infusion without difficulty.  No questions or concerns voiced.  PIV removed per protocol.  Pt left infusion center NAD, ambulatory.

## 2016-12-13 ENCOUNTER — Ambulatory Visit: Admission: RE | Admit: 2016-12-13 | Discharge: 2016-12-13 | Disposition: A | Discharge status: Home / Self Care

## 2016-12-13 DIAGNOSIS — R4020 Unspecified coma: Secondary | ICD-10-CM

## 2016-12-13 DIAGNOSIS — C3490 Malignant neoplasm of unspecified part of unspecified bronchus or lung: Principal | ICD-10-CM

## 2016-12-14 ENCOUNTER — Ambulatory Visit: Admission: RE | Admit: 2016-12-14 | Discharge: 2016-12-14 | Disposition: A | Discharge status: Home / Self Care

## 2016-12-14 ENCOUNTER — Ambulatory Visit
Admission: RE | Admit: 2016-12-14 | Discharge: 2016-12-14 | Disposition: A | Discharge status: Home / Self Care | Attending: Hematology & Oncology | Admitting: Hematology & Oncology

## 2016-12-14 DIAGNOSIS — C3490 Malignant neoplasm of unspecified part of unspecified bronchus or lung: Principal | ICD-10-CM

## 2016-12-14 LAB — ALBUMIN: Albumin:MCnc:Pt:Ser/Plas:Qn:: 3.4 — ABNORMAL LOW

## 2016-12-14 LAB — COMPREHENSIVE METABOLIC PANEL
ALBUMIN: 3.4 g/dL — ABNORMAL LOW (ref 3.5–5.0)
ALKALINE PHOSPHATASE: 178 U/L — ABNORMAL HIGH (ref 38–126)
ALT (SGPT): 27 U/L (ref 15–48)
ANION GAP: 11 mmol/L (ref 9–15)
AST (SGOT): 36 U/L (ref 14–38)
BILIRUBIN TOTAL: 0.5 mg/dL (ref 0.0–1.2)
BLOOD UREA NITROGEN: 15 mg/dL (ref 7–21)
BUN / CREAT RATIO: 21
CALCIUM: 8.2 mg/dL — ABNORMAL LOW (ref 8.5–10.2)
CHLORIDE: 107 mmol/L (ref 98–107)
CO2: 20 mmol/L — ABNORMAL LOW (ref 22.0–30.0)
EGFR MDRD AF AMER: >=60 mL/min/{1.73_m2} (ref >=60)
EGFR MDRD NON AF AMER: >=60 mL/min/{1.73_m2} (ref >=60)
GLUCOSE RANDOM: 85 mg/dL (ref 65–179)
POTASSIUM: 4.2 mmol/L (ref 3.5–5.0)
PROTEIN TOTAL: 6.9 g/dL (ref 6.5–8.3)
SODIUM: 138 mmol/L (ref 135–145)

## 2016-12-14 LAB — CBC W/ AUTO DIFF
BASOPHILS ABSOLUTE COUNT: 0 10*9/L (ref 0.0–0.1)
EOSINOPHILS ABSOLUTE COUNT: 0.1 10*9/L (ref 0.0–0.4)
HEMATOCRIT: 34.4 % — ABNORMAL LOW (ref 36.0–46.0)
HEMOGLOBIN: 11.2 g/dL — ABNORMAL LOW (ref 12.0–16.0)
LARGE UNSTAINED CELLS: 1 % (ref 0–4)
LYMPHOCYTES ABSOLUTE COUNT: 0.9 10*9/L — ABNORMAL LOW (ref 1.5–5.0)
MEAN CORPUSCULAR HEMOGLOBIN CONC: 32.6 g/dL (ref 31.0–37.0)
MEAN CORPUSCULAR HEMOGLOBIN: 32.4 pg (ref 26.0–34.0)
MEAN CORPUSCULAR VOLUME: 99.4 fL (ref 80.0–100.0)
MEAN PLATELET VOLUME: 8.5 fL (ref 7.0–10.0)
NEUTROPHILS ABSOLUTE COUNT: 3.4 10*9/L (ref 2.0–7.5)
PLATELET COUNT: 238 10*9/L (ref 150–440)
RED BLOOD CELL COUNT: 3.46 10*12/L — ABNORMAL LOW (ref 4.00–5.20)
RED CELL DISTRIBUTION WIDTH: 18.3 % — ABNORMAL HIGH (ref 12.0–15.0)
WBC ADJUSTED: 4.8 10*9/L (ref 4.5–11.0)

## 2016-12-14 LAB — NEUTROPHILS ABSOLUTE COUNT: Lab: 3.4

## 2016-12-14 NOTE — Unmapped (Signed)
REFERRING PROVIDER:   Rae Lips, Fnp  [address]  [address]  [facility]  [place], [place] [address]    PRIMARY CARE PROVIDER:  Quentin Ore, FNP  [address] [facility]  [place] [address]    CONSULTING PROVIDERS:  Dr. Jackelyn Poling    DIAGNOSIS: Stage IV adenoCA Lung  MOLECULAR PROFILE: +ALK Re-arrangment  CURRENT TREATMENT: alectinib since Aug 2017  PREVIOUS TREATMENT: crizotinib x51mos  REASON FOR VISIT: [name], a 66 y.o. female is seen today for further mgmt of stage IV adenocarcinoma of the lung.    ASSESSMENT/PLAN  1. Stage IV adenocarcinoma of the lung; ALK+.  Metastases to bilateral supraclavicular, mediastinal, and R hilar node, T7 and T10, and right pleural fluid. CT scans on 11/03/2015 indicated progression of disease.  Switched therapy to alectinib and it is going well so far. CT scans from 01/18/16 indicate response. CT scans from 07/11/2016 which showed stable disease.  There was a indeterminate sclerotic lesion in the sternum which was present on the 11/03/2015 scan.  Scans on 10/10/16 show no new concerning lesions with ongoing decrease in right lower lobe nodule.  She continues to tolerate alectinib well.    Will continue alectinib for now, and repeat scans at the subsequent follow up, around October/November 2018.    - RTC 1 month with scans  - Continue alectinib    2. Syncope: Had a solitary episode of LOC in early September 2018, possibly dehydration but no cause identified.  Given stable brain MRI on 12/13/16, we will continue to monitor    3. Ulcerated lipodermatosclerosis of LLE;  Likely 2/2 chronic venous insufficiency.  Being followed closely by wound care.    4. HTN:  Cont current meds.  PCP will continue to assist w/ management     5. Other medical problems: Rhabdomyolysis likely secondary to daptomycin, acute kidney injury secondary to rhabdo and HTN (resolved).    6. Depression. Appreciate CCSP support.  [facility] following. Previously improved with anti-depressant, will continue this today. She also uses ativan prn.    7. Pain: cont hydrocodone/APAP prn. Refilled today. Neuropathic in nature and mostly at night. Improved significantly with gabapentin qhs.        HISTORY OF PRESENT ILLNESS: [name] presents today as a new patient for further evaluation of stage IV adenocarcinoma of the lung. Please see below in oncology history for further details. In summary, patient presented with nausea and vomiting, and underwent workup including CT scan of the abdomen, which revealed 2.8 cm lung nodule in the right lower lung base. Further staging workup including PET CT scan revealed R lung nodules/masses. Bilateral supraclavicular, mediastinal, and R hilar node mets. Bone mets T7 and T10. Mod sized R pleural effusion, small L pleural effusion. Thoracentesis of right pleural effusion showed malignant cells. Right subclavian lymph node FNA also showed pathologic findings consistent with adenocarcinoma of lung primary. The patient visited cancer Hospital today for medical and radiation oncology evaluation.    Patient was previously treated for suspected cellulitis of the left lower extremity at [facility] from 10/12-10/27/16 and was discharged on IV daptomycin, IV ertapenem, and Pulse Vac therapy. She was readmitted on 02/01/15 due to weakness and rhabdomyolysis likely secondary to daptomycin. On 02/04/15 she was transferred to [facility] for further management. She was initially started on IV Zyvox and continued on the IV Invanz. Dermatology was consulted and the patient's  presentation was thought to be more consistent with lipodermatosclerosis. Tissue biopsy was consistent with lipodermatosclerosis secondary to chronic venous stasis.    INTERVAL HX:     Presents today with her husband.  No new issues.  No further syncopal events since her last visit.  She continues to work on her strength and exercise tolerance through walking around her house and neighborhood.      Oncology History    11        Primary adenocarcinoma of right lung (CMS-HCC)    01/14/2015 -  Presenting Symptoms     Presented with nausea, vomiting and diarrhea. Underwent abdominal CT scan, which incidentally found 2.8 cm nodule in the right lung base with multiple w/ additional sub cm ground-glass nodules.         01/16/2015 Biopsy     EBUS Bx of RLL - negative for malignancy         02/05/2015 -  Cancer Staged     PET/CT : FDG avid R lung nodules/masses. Bilateral supraclavicular, mediastinal, and R hilar node mets. Bone mets T7 and T10. Mod sized R pleural effusion, small L pleural effusion. Mild FDG avid b/l iliac, inguinal, femoral LNs - likely benign.         02/09/2015 Procedure     R thoracentesis 750cc removed - positive for malignancy.          02/09/2015 Biopsy     R supraclavicular LN FNA - positive for malignancy.            PAST MEDICAL HISTORY  Past Medical History:   Diagnosis Date   ??? Asthma    ??? Cellulitis    ??? Diverticulitis    ??? Hypertension 04/27/2015   ??? Lung cancer, lower lobe (CMS-HCC)     RIGHT   ??? Renal failure     AKI with failure now resolved   ??? Seasonal allergies    ??? Shingles        Allergies   Allergen Reactions   ??? Daptomycin Other (See Comments)     Rhabdomyolysis   ??? Dilaudid [Hydromorphone] Nausea And Vomiting       MEDICATIONS  Current Outpatient Prescriptions on File Prior to Visit   Medication Sig Dispense Refill   ??? albuterol (PROAIR HFA) 90 mcg/actuation inhaler Inhale 2 puffs every six (6) hours as needed for wheezing. 1 Inhaler 3   ??? ALECENSA 150 mg capsule TAKE 4 CAPSULES (600 MG) BY MOUTH TWICE DAILY WITH MEALS 240 capsule 11   ??? cetirizine (ZYRTEC) 10 MG tablet Take 10 mg by mouth nightly.      ??? elastic bandage (SUREPRESS HIGH COMPRESSION) 4 X 3.2 -yard [employer] Apply 1 application topically daily. Apply as directed; remove at night 1 each 0   ??? foam bandage 2  Bndg Apply 1 application topically daily. 1 each 0   ??? furosemide (LASIX) 20 MG tablet TAKE 1 TABLET(20 MG) BY MOUTH TWICE DAILY 60 tablet 6   ??? gabapentin (NEURONTIN) 300 MG capsule Take 3 capsules (900 mg total) by mouth nightly. 90 capsule 5   ??? HYDROcodone-acetaminophen (NORCO) 5-325 mg per tablet Take 1 tablet by mouth Every six (6) hours. 120 tablet 0   ??? LORazepam (ATIVAN) 0.5 MG tablet Take 1-2 po QHS or as needed 60 tablet 0   ??? potassium chloride 20 mEq TbER Take 20 mEq by mouth Two (2) times a day. 60 tablet 0   ??? prochlorperazine (COMPAZINE) 10 MG tablet TAKE 1  TABLET BY MOUTH EVERY 6 HOURS AS NEEDED FOR NAUSEA 60 tablet 3   ??? ranitidine (ZANTAC) 150 MG tablet Take 150 mg by mouth continuous as needed for heartburn.     ??? sertraline (ZOLOFT) 100 MG tablet TAKE 1 AND 1/2 TABLETS BY MOUTH EVERY DAY 45 tablet 6   ??? triamcinolone (KENALOG) 0.1 % cream Apply topically Two (2) times a day. 85 g 2   ??? VASHE WOUND THERAPY 0.033 % IrSl IRRIGATE WITH 16 OZ AS DIRECTED TWICE DAILY 3000 mL 0   ??? lisinopril (PRINIVIL,ZESTRIL) 10 MG tablet Take 1 tablet (10 mg total) by mouth daily. 90 tablet 3     No current facility-administered medications on file prior to visit.        FAMILY HISTORY  Cancer-related family history includes Breast cancer in her mother.    SOCIAL HISTORY  she is a never smoker, no Etoh.    - Married, mother of 2. 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     PHYSICAL EXAM  ECOG Performance Status: 1  Vitals:    12/14/16 1459   BP: 138/78   Pulse: 80   Resp: 18   Temp: 36.7 ??C (98.1 ??F)   SpO2: 95%     GEN: Awake and alert, pleasant appearing female in no acute distress  HEENT: [place]/AT. Pupils equally round without scleral icterus. EOMI. OP clear.   LYMPH: No palpable cervical or clavicular lymph nodes  CV: Regular rhythm with normal rate, no murmurs  LUNGS: Clear to auscultation bilaterally without wheeze or rhonchi, slight decrease in right base.  SKIN: No rashes or lesions over forearms, face, neck  ABD: Soft and non-tender with no distention or palpable masses. Bowel sounds are active  [facility]: Alert and oriented to person, place and time  EXT: LLE wrapped, RLE w/no pitting edema.  NEURO: CN II-XII intact. Gait stable with mild instability, needing minimal assistance getting onto exam table. Speech intact. Strength 5/5 in extremities bil.     DIAGNOSTIC DATA: I have personally reviewed the patient's records in EPIC including but not limited to clinic notes, admission notes, operative reports, pathology reports, lab results and imaging results.     RADIOLOGY RESULTS: I have personally reviewed the patient's CT scan or PET scan on PACS that shows the following results.     MRI Brain w wo contrast 12/13/2016  Impression     No evidence of metastatic disease.  ??         CT C/A/P 10/10/2016  Impression Mild interval decrease in the size of 1.1 cm right lower lobe nodule, previously measured 1.5 cm on 07/11/2016.    Multiple bibasilar micronodules, likely mucoid impacted airways.        10/10/16  FINDINGS:   CHEST:  AIRWAYS:  Clear central airways.    Bibasilar paravertebral linear opacities, likely scarring.    Interval decrease in the size of right lower lobe nodule measuring 1.1 cm (series 3, image 63), previously measured 1.5 cm when remeasured in a similar manner and location. Few bibasilar micronodules, likely mucoid impacted airways.    No pleural effusion.    MEDIASTINUM:    Normal heart size. No pericardial effusion.    Normal caliber thoracic aorta.    No lymphadenopathy.    ABDOMEN/PELVIS:  HEPATOBILIARY: New low-attenuation lesions  in the right hepatic lobe measuring approximately 1.7 cm (1:28) and 1.1 cm (1:20). Diffuse hepatic steatosis.  The spleen, pancreas, right lateral adrenal glands are unremarkable.  KIDNEYS/URETERS: Subcentimeter low-attenuation lesion in left kidney, too small to characterize by CT but likely simple cyst.  BLADDER: Unremarkable.  BOWEL/PERITONEUM/RETROPERITONEUM: No bowel obstruction. No acute inflammatory process. No ascites. Diverticulosis without diverticulitis.Small fat-containing umbilical hernia.  VASCULATURE: Abdominal aorta within normal limits for patient's age. Unremarkable inferior vena cava.  LYMPH NODES: No adenopathy.  REPRODUCTIVE ORGANS: Unremarkable.    BONES/SOFT TISSUES: Right Multilevel degenerative changes of the spine.     ??      Impression       Mild interval decrease in the size of 1.1 cm right lower lobe nodule, previously measured 1.5 cm on 07/11/2016.    Multiple bibasilar micronodules, likely mucoid impacted airways.          CT chest/abdomen 07/11/2016  Impression     Right lower lobe pulmonary nodule, grossly unchanged.       CT chest abdomen and pelvis 04/11/2016  Impression     Grossly unchanged spiculated nodule in the right lower lobe. No new lung lesions identified.    Subcentimeter sclerotic lesion in the sternum, not seen on the chest CT dated 01/15/2015, indeterminate. Attention on follow-up.         Brain MRI 11/03/15:     Impression     No evidence of metastatic disease to the brain.    New focus of signal abnormality within the right precentral gyrus without enhancement which may represent an interval, age-indeterminate infarct.       CT 01/18/16:  Impression     -Decreasing fissural nodularity about the right major minor fissures, decreasing left subpectoral/axillary lymphadenopathy, as well as, decrease in now trace right pleural effusion and pericardial effusion.   -Stable right lower lobe lesion. No new sites of disease identified.       PATHOLOGY:  02/09/2015  Final Diagnosis     A: Lymph node, right supraclavicular, fine needle aspiration  - Malignant cells present  - Consistent with metastatic lung adenocarcinoma (see comment)   Immunohistochemical stains were performed and tumor cells are diffusely positive for TTF-1 and negative for p40, supporting the diagnosis of metastatic lung adenocarcinoma. Molecular testing will be performed and results will be reported separately.    02/09/2015  A. Pleural fluid, right, thoracentesis  - Malignant cells present  - Consistent with patient's known metastatic lung adenocarcinoma.    MOLECULAR TESTING:  +ALK re-arrangement on in house cytogenetics.

## 2016-12-14 NOTE — Unmapped (Signed)
-   Your brain MRI today looks good, no evidence of cancer  - We will continue on the alectinib  - Come back and see [place] again in 1 month with repeat imaging of your chest and abdomen  - Keep hydrated, call us if there are any new issues or concerns

## 2016-12-28 ENCOUNTER — Ambulatory Visit
Admission: RE | Admit: 2016-12-28 | Discharge: 2016-12-28 | Disposition: A | Discharge status: Home / Self Care | Attending: Vascular Surgery | Admitting: Vascular Surgery

## 2016-12-28 DIAGNOSIS — T148XXA Other injury of unspecified body region, initial encounter: Principal | ICD-10-CM

## 2016-12-28 NOTE — Unmapped (Signed)
BP 155/50  - Pulse 82  - Temp 36.6 ??C (97.9 ??F) (Temporal)  - LMP  (LMP Unknown)     Wound Back Left;Upper pink unapproximated no drainage (Active)       Wound 09/15/16 Leg Left (Active)   Wound Status Not Healed 12/28/2016  1:15 PM   Pain 0 12/28/2016  1:15 PM   Dressing Status      Removed 12/28/2016  1:15 PM   Wound Length (cm)      10.2 12/28/2016  1:15 PM   Wound Width (cm)      24 12/28/2016  1:15 PM   Wound Depth (cm)      0.2 12/28/2016  1:15 PM   Area (sq cm) 244.8 12/28/2016  1:15 PM   Volume (mL) 48.96 12/28/2016  1:15 PM   Wound Length % Healed (Calculated) -18.6 % 12/28/2016  1:15 PM   Wound Width % Healed (Calculated) 1.64 % 12/28/2016  1:15 PM   Wound Depth % Healed   0 % 12/28/2016  1:15 PM   Area (sq cm) % Healed (Calculated) -16.66 % 12/28/2016  1:15 PM   Volume (mL) % Healed   -16.66 % 12/28/2016  1:15 PM   Wound Bed Pink;Red 12/28/2016  1:15 PM   Odor None 12/28/2016  1:15 PM   Margins Undefined edges 12/28/2016  1:15 PM   Peri-wound Assessment      Edema;Clean;Dry 12/28/2016  1:15 PM   Encounter Subsequent 12/28/2016  1:15 PM   Slough % 1-25% 12/28/2016  1:15 PM   Eschar % None 12/28/2016  1:15 PM   Epithelialization % 26-50% 12/28/2016  1:15 PM   Granulation % 26-50% 12/28/2016  1:15 PM   Exudate Type      Sero-sanguineous 12/28/2016  1:15 PM   Exudate Amnt      Moderate 12/28/2016  1:15 PM   Thickness Full Thickness 12/28/2016  1:15 PM   Exposed Structure N/A 12/28/2016  1:15 PM   Paring No 12/28/2016  1:15 PM   Texture Edema 12/28/2016  1:15 PM   Moisture Moist 12/28/2016  1:15 PM   Temperature WNL 12/28/2016  1:15 PM   Treatments Cleansed/Irrigation;Pharmaceutical agent 12/28/2016  1:15 PM   Picture Taken Yes 12/28/2016  1:15 PM     This is a patient who had a nearly circumferential lesion of the left calf. She underwent sequential debridements. In addition she underwent laser treatments to the wound.    Recently she is undergone a split thickness skin graft to this area.    On physical exam the patient's awake alert in no acute distress. She is able to communicate without difficulty    Vital signs were reviewed and the patient's afebrile    The wound continues to improve. The dimensions are less there is increased epithelialization and healthy granulation tissue. There is no evidence of infection    Impression healing wound left lower extremity    Plan she will continue the present dressing and compression was sure press and we will see her back in clinic in approximately 3 weeks.

## 2016-12-28 NOTE — Unmapped (Signed)
[facility] Refill Coordination Note  Specialty Medication(s): Alecensa 150mg     CHUNDRA SAUERWEIN, DOB: 1950/09/12  Phone: [phone] (home) , Alternate phone contact: N/A  Phone or address changes today?: No  All above HIPAA information was verified with patient.  Shipping Address: [address]  [place] [address]   Insurance changes? No    Completed refill call assessment today to schedule patient's medication shipment from the [facility] [phone]).      Confirmed the medication and dosage are correct and have not changed: Yes, regimen is correct and unchanged.    Confirmed patient started or stopped the following medications in the past month:  No, there are no changes reported at this time.    Are you tolerating your medication?:  Jailey reports tolerating the medication.    ADHERENCE      Did you miss any doses in the past 4 weeks? No missed doses reported.    FINANCIAL/SHIPPING    Delivery Scheduled: Yes, Expected medication delivery date: 01/04/17     Lakindra did not have any additional questions at this time.    Delivery address validated in [facility] scheduling system: Yes, address listed in [facility] is correct.    We will follow up with patient monthly for standard refill processing and delivery.      Thank you,  [employer]   [facility] Specialty Pharmacist

## 2016-12-28 NOTE — Unmapped (Addendum)
Leg and Ankle Edema: Care Instructions  Your Care Instructions  Swelling in the legs, ankles, and feet is called edema. It is common after you sit or stand for a while. Long plane flights or car rides often cause swelling in the legs and feet. You may also have swelling if you have to stand for long periods of time at your job. Problems with the veins in the legs (varicose veins) and changes in hormones can also cause swelling. Sometimes the swelling in the ankles and feet is caused by a more serious problem, such as heart failure, infection, blood clots, or liver or kidney disease.  Follow-up care is a key part of your treatment and safety. Be sure to make and go to all appointments, and call your doctor if you are having problems. It's also a good idea to know your test results and keep a list of the medicines you take.  How can you care for yourself at home?  ?? If your doctor gave you medicine, take it as prescribed. Call your doctor if you think you are having a problem with your medicine.  ?? Whenever you are resting, raise your legs up. Try to keep the swollen area higher than the level of your heart.  ?? Take breaks from standing or sitting in one position.  ?? Walk around to increase the blood flow in your lower legs.  ?? Move your feet and ankles often while you stand, or tighten and relax your leg muscles.  ?? Wear support stockings. Put them on in the morning, before swelling gets worse.  ?? Eat a balanced diet. Lose weight if you need to.  ?? Limit the amount of salt (sodium) in your diet. Salt holds fluid in the body and may increase swelling.  When should you call for help?  Call 911 anytime you think you may need emergency care. For example, call if:  ?? ?? You have symptoms of a blood clot in your lung (called a pulmonary embolism). These may include:  ?? Sudden chest pain.  ?? Trouble breathing.  ?? Coughing up blood.   ??Call your doctor now or seek immediate medical care if:  ?? ?? You have signs of a blood clot, such as:  ?? Pain in your calf, back of the knee, thigh, or groin.  ?? Redness and swelling in your leg or groin.   ?? ?? You have symptoms of infection, such as:  ?? Increased pain, swelling, warmth, or redness.  ?? Red streaks or pus.  ?? A fever.   ??Watch closely for changes in your health, and be sure to contact your doctor if:  ?? ?? Your swelling is getting worse.   ?? ?? You have new or worsening pain in your legs.   ?? ?? You do not get better as expected.   Where can you learn more?  Go to [facility] at [email or web address]  Select Preferences in the upper right hand corner, then select Health Library under Resources. Enter [phone] in the search box to learn more about Leg and Ankle Edema: Care Instructions.  Current as of: February 22, 2016  Content Version: 11.8  ?? 2006-2018 [employer]. Care instructions adapted under license by [facility]. If you have questions about a medical condition or this instruction, always ask your healthcare professional. [employer], Inc[employer] disclaims any warranty or liability for your use of this information.

## 2017-01-03 MED FILL — ALECENSA/150MG/CAPS: ALECENSA/150MG/CAPS | 30 days supply | Qty: 240 | Fill #3

## 2017-01-09 ENCOUNTER — Ambulatory Visit: Admission: RE | Admit: 2017-01-09 | Discharge: 2017-01-09 | Disposition: A | Discharge status: Home / Self Care

## 2017-01-09 DIAGNOSIS — C3490 Malignant neoplasm of unspecified part of unspecified bronchus or lung: Principal | ICD-10-CM

## 2017-01-12 ENCOUNTER — Ambulatory Visit: Admission: RE | Admit: 2017-01-12 | Discharge: 2017-01-12 | Disposition: A | Discharge status: Home / Self Care

## 2017-01-12 ENCOUNTER — Ambulatory Visit
Admission: RE | Admit: 2017-01-12 | Discharge: 2017-01-12 | Disposition: A | Discharge status: Home / Self Care | Attending: Hematology & Oncology

## 2017-01-12 DIAGNOSIS — C3490 Malignant neoplasm of unspecified part of unspecified bronchus or lung: Principal | ICD-10-CM

## 2017-01-12 DIAGNOSIS — C3491 Malignant neoplasm of unspecified part of right bronchus or lung: Principal | ICD-10-CM

## 2017-01-12 DIAGNOSIS — R0609 Other forms of dyspnea: Secondary | ICD-10-CM

## 2017-01-12 LAB — CBC W/ AUTO DIFF
BASOPHILS ABSOLUTE COUNT: 0 10*9/L (ref 0.0–0.1)
EOSINOPHILS ABSOLUTE COUNT: 0 10*9/L (ref 0.0–0.4)
HEMATOCRIT: 34 % — ABNORMAL LOW (ref 36.0–46.0)
HEMOGLOBIN: 11.8 g/dL — ABNORMAL LOW (ref 12.0–16.0)
LARGE UNSTAINED CELLS: 1 % (ref 0–4)
LYMPHOCYTES ABSOLUTE COUNT: 0.6 10*9/L — ABNORMAL LOW (ref 1.5–5.0)
MEAN CORPUSCULAR HEMOGLOBIN CONC: 34.6 g/dL (ref 31.0–37.0)
MEAN CORPUSCULAR HEMOGLOBIN: 33.3 pg (ref 26.0–34.0)
MEAN CORPUSCULAR VOLUME: 96.4 fL (ref 80.0–100.0)
MEAN PLATELET VOLUME: 7.9 fL (ref 7.0–10.0)
MONOCYTES ABSOLUTE COUNT: 0.2 10*9/L (ref 0.2–0.8)
NEUTROPHILS ABSOLUTE COUNT: 2.8 10*9/L (ref 2.0–7.5)
PLATELET COUNT: 215 10*9/L (ref 150–440)
RED CELL DISTRIBUTION WIDTH: 17.3 % — ABNORMAL HIGH (ref 12.0–15.0)
WBC ADJUSTED: 3.8 10*9/L — ABNORMAL LOW (ref 4.5–11.0)

## 2017-01-12 LAB — COMPREHENSIVE METABOLIC PANEL
ALBUMIN: 3.8 g/dL (ref 3.5–5.0)
ALT (SGPT): 39 U/L (ref 15–48)
ANION GAP: 9 mmol/L (ref 9–15)
AST (SGOT): 44 U/L — ABNORMAL HIGH (ref 14–38)
BILIRUBIN TOTAL: 0.6 mg/dL (ref 0.0–1.2)
BLOOD UREA NITROGEN: 8 mg/dL (ref 7–21)
BUN / CREAT RATIO: 13
CALCIUM: 8.5 mg/dL (ref 8.5–10.2)
CHLORIDE: 92 mmol/L — ABNORMAL LOW (ref 98–107)
CO2: 22 mmol/L (ref 22.0–30.0)
CREATININE: 0.62 mg/dL (ref 0.60–1.00)
EGFR MDRD AF AMER: >=60 mL/min/{1.73_m2} (ref >=60)
EGFR MDRD NON AF AMER: >=60 mL/min/{1.73_m2} (ref >=60)
GLUCOSE RANDOM: 106 mg/dL (ref 65–179)
POTASSIUM: 3.4 mmol/L — ABNORMAL LOW (ref 3.5–5.0)
PROTEIN TOTAL: 7.5 g/dL (ref 6.5–8.3)

## 2017-01-12 LAB — BASOPHILS ABSOLUTE COUNT: Lab: 0

## 2017-01-12 LAB — SODIUM: Sodium:SCnc:Pt:Ser/Plas:Qn:: 123 — ABNORMAL LOW

## 2017-01-12 MED ORDER — PANTOPRAZOLE 20 MG TABLET,DELAYED RELEASE
ORAL_TABLET | Freq: Every day | ORAL | 1 refills | 0.00000 days | Status: CP
Start: 2017-01-12 — End: 2017-03-05

## 2017-01-12 MED ORDER — HYDROCODONE 5 MG-ACETAMINOPHEN 325 MG TABLET
ORAL_TABLET | Freq: Four times a day (QID) | ORAL | 0 refills | 0 days | Status: CP
Start: 2017-01-12 — End: 2017-02-16

## 2017-01-12 MED ORDER — LORAZEPAM 0.5 MG TABLET
ORAL_TABLET | 0 refills | 0 days | Status: CP
Start: 2017-01-12 — End: 2017-02-16

## 2017-01-12 MED ORDER — FUROSEMIDE 20 MG TABLET
ORAL_TABLET | Freq: Every day | ORAL | 6 refills | 0 days
Start: 2017-01-12 — End: ?

## 2017-01-12 NOTE — Unmapped (Addendum)
It was a pleasure seeing you today.    Your cancer is stable to improved on your scans.    You have new areas of inflammation or infection in your lungs. This could be due to a viral infection or less likely a side effect of the alectinib.    We would like to check a respiratory viral panel today (nasal swab) to see if you have a respiratory virus.    Unless that is positive, we would like you to see the pulmonologists. They will try to see you next week. Someone will contact you with this appointment.    Please hold your alectinib for now.    Your sodium is low today. We would like you to be sure to eat and drink well, and then we will recheck next week.    Restart your Lasix and potassium.     I have started a new medicine for reflux, called pantoprazole.

## 2017-01-12 NOTE — Unmapped (Signed)
Nasopharyngeal swab performed for RVP NAA.  Sent to microbiology for analysis.

## 2017-01-12 NOTE — Unmapped (Signed)
REFERRING PROVIDER:   Rae Lips, Fnp  [address]  [address]  [facility]  [place], [place] [address]    PRIMARY CARE PROVIDER:  Quentin Ore, FNP  [address] [facility]  [place] [address]    CONSULTING PROVIDERS:  Dr. Jackelyn Poling    DIAGNOSIS: Stage IV adenoCA Lung  MOLECULAR PROFILE: +ALK Re-arrangment  CURRENT TREATMENT: alectinib since Aug 2017  PREVIOUS TREATMENT: crizotinib x74mos  REASON FOR VISIT: [name], a 67 y.o. female is seen today for further mgmt of stage IV adenocarcinoma of the lung.    ASSESSMENT/PLAN  # Stage IV adenocarcinoma of the lung; ALK+.  Metastases to bilateral supraclavicular, mediastinal, and R hilar node, T7 and T10, and right pleural fluid. CT scans on 11/03/2015 indicated progression of disease.  Switched therapy to alectinib and it is going well so far. CT scans from 01/18/16 indicate response. CT scans this week show stable to improved disease with new GGO of unclear etiology, see below.     Hold alectinib for now, pending further evaluation of GGO.     # Dyspnea on exertion with new groundglass opacities on CT chest: Etiology unclear at this time. Symptoms progressive over past 3 months and now at the point that patient feels very scared about it. She is not hypoxic on pulse oximetry. Will check respiratory viral panel today. If negative, will arrange for IP evaluation next week. Considered ILD secondary to alectinib, which is reported. Given severity of her symptoms, will hold alectinib until further evaluation. Other ALK inhibitors also cause ILD, so if this is felt to be ILD 2/2 TKI, would have to carefully consider next line of therapy. Will also have her restart her furosemide 20 mg daily, as she appears slightly hypervolemic on exam today, and onset of symptoms correlates with discontinuation of this therapy.    # Hyponatremia: Appears hypervolemic today. Asymptomatic from HypoNa. Will restart furosemide and repeat labs next week. Also advised to start K supplementation.    # Syncope: Had a solitary episode of LOC in early September 2018, possibly dehydration but no cause identified.  Given stable brain MRI on 12/13/16, we will continue to monitor    # Ulcerated lipodermatosclerosis of LLE;  Likely 2/2 chronic venous insufficiency.  Being followed closely by wound care.    # HTN:  Cont current meds.  PCP will continue to assist w/ management     # Hx of Rhabdomyolysis likely secondary to daptomycin, acute kidney injury secondary to rhabdo and HTN (resolved).    # Depression. Appreciate [facility]SP support.  [facility] following.  Previously improved with anti-depressant, will continue this today. She also uses ativan prn, refilled today.    # Chronic Pain: cont hydrocodone/APAP prn. Refilled today. Neuropathic in nature and mostly at night. Improved significantly with gabapentin qhs.    RTC in 6 weeks or sooner depending on evaluation of dyspnea    This patient was seen and discussed with Dr. Nedra Hai.    Wendall Papa, MD, MPH  Hematology-Oncology Fellow        HISTORY OF PRESENT ILLNESS: Angelica [name] presents today as a new patient for further evaluation of stage IV adenocarcinoma of the lung. Please see below in oncology history for further details. In summary, patient presented with nausea and vomiting, and underwent workup including CT scan of the abdomen, which revealed 2.8 cm lung nodule in the right lower lung base.  Further staging workup including PET CT scan revealed R lung nodules/masses. Bilateral supraclavicular, mediastinal, and R hilar node mets. Bone mets T7 and T10. Mod sized R pleural effusion, small L pleural effusion. Thoracentesis of right pleural effusion showed malignant cells. Right subclavian lymph node FNA also showed pathologic findings consistent with adenocarcinoma of lung primary.     Patient was previously treated for suspected cellulitis of the left lower extremity at [facility] from 10/12-10/27/16 and was discharged on IV daptomycin, IV ertapenem, and Pulse Vac therapy. She was readmitted on 02/01/15 due to weakness and rhabdomyolysis likely secondary to daptomycin. On 02/04/15 she was transferred to [facility] for further management. She was initially started on IV Zyvox and continued on the IV Invanz. Dermatology was consulted and the patient's presentation was thought to be more consistent with lipodermatosclerosis. Tissue biopsy was consistent with lipodermatosclerosis secondary to chronic venous stasis.    INTERVAL HX:   Angelica [name] was last seen in clinic on 12/20/16 by Dr. Dorna Mai. Since then, she has remained on alectinib. She has been increasingly short of breath with exertion over the past 3 months. It is scaring me. She thinks she is getting worse and sometimes feel like she might pass out (although has not done so since her prior syncopal event). She feels short of breath even walking around her house with her walker. Denies dyspnea at rest. Denies chest pain. Reports worsening LE edema and slight facial edema since discontinuation of furosemide about 6 weeks ago. Using albuterol inhaler prn, which helps some. Denies fever, nasal congestion, sore throat. She also reports a dry cough. She has morning nausea, which is improved with Zantac. Feels like it may be reflux. Continues to be able to perform all self-care and gets out of the house at least a few times per week for errands, lunch with friends, seeing family, etc.      Oncology History    11        Primary adenocarcinoma of right lung (CMS-HCC)    01/14/2015 -  Presenting Symptoms     Presented with nausea, vomiting and diarrhea. Underwent abdominal CT scan, which incidentally found 2.8 cm nodule in the right lung base with multiple w/ additional sub cm ground-glass nodules.         01/16/2015 Biopsy     EBUS Bx of RLL - negative for malignancy         02/05/2015 -  Cancer Staged     PET/CT : FDG avid R lung nodules/masses. Bilateral supraclavicular, mediastinal, and R hilar node mets. Bone mets T7 and T10. Mod sized R pleural effusion, small L pleural effusion. Mild FDG avid b/l iliac, inguinal, femoral LNs - likely benign.         02/09/2015 Procedure     R thoracentesis 750cc removed - positive for malignancy.          02/09/2015 Biopsy     R supraclavicular LN FNA - positive for malignancy.            PAST MEDICAL HISTORY  Past Medical History:   Diagnosis Date   ??? Asthma    ??? Cellulitis    ??? Diverticulitis    ??? Hypertension 04/27/2015   ??? Lung cancer, lower lobe (CMS-HCC)     RIGHT   ??? Renal failure     AKI with failure now resolved   ??? Seasonal allergies    ??? Shingles        Allergies   Allergen  Reactions   ??? Daptomycin Other (See Comments)     Rhabdomyolysis   ??? Dilaudid [Hydromorphone] Nausea And Vomiting       MEDICATIONS  Current Outpatient Prescriptions on File Prior to Visit   Medication Sig Dispense Refill   ??? albuterol (PROAIR HFA) 90 mcg/actuation inhaler Inhale 2 puffs every six (6) hours as needed for wheezing. 1 Inhaler 3   ??? ALECENSA 150 mg capsule TAKE 4 CAPSULES (600 MG) BY MOUTH TWICE DAILY WITH MEALS 240 capsule 11   ??? cetirizine (ZYRTEC) 10 MG tablet Take 10 mg by mouth nightly.      ??? elastic bandage (SUREPRESS HIGH COMPRESSION) 4 X 3.2 -yard [employer] Apply 1 application topically daily. Apply as directed; remove at night 1 each 0   ??? foam bandage 2  Bndg Apply 1 application topically daily. 1 each 0   ??? furosemide (LASIX) 20 MG tablet TAKE 1 TABLET(20 MG) BY MOUTH TWICE DAILY 60 tablet 6   ??? gabapentin (NEURONTIN) 300 MG capsule Take 3 capsules (900 mg total) by mouth nightly. 90 capsule 5   ??? HYDROcodone-acetaminophen (NORCO) 5-325 mg per tablet Take 1 tablet by mouth Every six (6) hours. 120 tablet 0   ??? lisinopril (PRINIVIL,ZESTRIL) 10 MG tablet Take 1 tablet (10 mg total) by mouth daily. 90 tablet 3   ??? LORazepam (ATIVAN) 0.5 MG tablet Take 1-2 po QHS or as needed 60 tablet 0   ??? potassium chloride 20 mEq TbER Take 20 mEq by mouth Two (2) times a day. 60 tablet 0   ??? prochlorperazine (COMPAZINE) 10 MG tablet TAKE 1 TABLET BY MOUTH EVERY 6 HOURS AS NEEDED FOR NAUSEA 60 tablet 3   ??? ranitidine (ZANTAC) 150 MG tablet Take 150 mg by mouth continuous as needed for heartburn.     ??? sertraline (ZOLOFT) 100 MG tablet TAKE 1 AND 1/2 TABLETS BY MOUTH EVERY DAY 45 tablet 6   ??? triamcinolone (KENALOG) 0.1 % cream Apply topically Two (2) times a day. 85 g 2   ??? VASHE WOUND THERAPY 0.033 % IrSl IRRIGATE WITH 16 OZ AS DIRECTED TWICE DAILY 3000 mL 0     No current facility-administered medications on file prior to visit.        FAMILY HISTORY  Cancer-related family history includes Breast cancer in her mother.    SOCIAL HISTORY  she is a never smoker, no Etoh.    - Married, mother of 2. 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     PHYSICAL EXAM  ECOG Performance Status: 1  Vitals:    01/12/17 1314   BP: 151/72   Pulse: 83   Resp: 18   Temp: 36.7 ??C (98.1 ??F)   SpO2: 96%     GEN: Pleasant, well-appearing woman in NAD, walker in room, husband at her side  HEENT: Pupils equally round, sclerae anicteric, conjunctiva clear, moist mucous membranes, no oral lesions or exudates, slight right lid lag compared to left  NECK: Supple, no JVD at 90 degrees  HEME/LYMPH: No palpable cervical or supraclavicular lymphadenopathy  CV: Normal rate, regular rhythm, normal S1 and S2, no murmurs, rubs, or gallops; extremities warm, well-perfused, trace R lower extremity edema; LLE wrapped  RESP: Lungs clear to auscultation bilaterally, no wheezes, ronchi or rales, normal work of breathing  GI: Soft,  non-tender, non-distended, normoactive bowel sounds, no masses or organomegaly  SKIN AND SUBCUTANEOUS TISSUES: Ecchymoses on bilateral hands, no rash  NEURO: Alert and oriented, speech intact, CN II, III, IV, V, VI intact, following commands, moving all extremities well, no focal deficits appreciated  PSYCH: Normal mood and appropriate affect      DIAGNOSTIC DATA: I have personally reviewed the patient's records in EPIC including but not limited to clinic notes, admission notes, operative reports, pathology reports, lab results and imaging results.     RADIOLOGY RESULTS: I have personally reviewed the patient's CT scan or PET scan on PACS that shows the following results.     CT chest, abdomen with contrast, 01/09/17:  Impression     --Stable right lower lobe nodule measuring 1.7 cm; unchanged since 10/11/2016 when remeasured in similar manner and location    --New small right pleural effusion. Small pericardial effusion.    --Diffusely scattered groundglass opacities in bilateral lungs; nonspecific, representing infection/inflammation. Recommend follow-up chest CT in 6-8 weeks to ensure clearing.       MRI Brain w wo contrast 12/13/2016  Impression     No evidence of metastatic disease.  ??         CT C/A/P 10/10/2016  Impression Mild interval decrease in the size of 1.1 cm right lower lobe nodule, previously measured 1.5 cm on 07/11/2016.    Multiple bibasilar micronodules, likely mucoid impacted airways.        10/10/16  FINDINGS:   CHEST:  AIRWAYS:  Clear central airways.    Bibasilar paravertebral linear opacities, likely scarring.    Interval decrease in the size of right lower lobe nodule measuring 1.1 cm (series 3, image 63), previously measured 1.5 cm when remeasured in a similar manner and location. Few bibasilar micronodules, likely mucoid impacted airways.    No pleural effusion.    MEDIASTINUM:    Normal heart size. No pericardial effusion.    Normal caliber thoracic aorta.    No lymphadenopathy.    ABDOMEN/PELVIS:  HEPATOBILIARY: New low-attenuation lesions in the right hepatic lobe measuring approximately 1.7 cm (1:28) and 1.1 cm (1:20). Diffuse hepatic steatosis.  The spleen, pancreas, right lateral adrenal glands are unremarkable.  KIDNEYS/URETERS: Subcentimeter low-attenuation lesion in left kidney, too small to characterize by CT but likely simple cyst.  BLADDER: Unremarkable.  BOWEL/PERITONEUM/RETROPERITONEUM: No bowel obstruction. No acute inflammatory process. No ascites. Diverticulosis without diverticulitis.Small fat-containing umbilical hernia.  VASCULATURE: Abdominal aorta within normal limits for patient's age. Unremarkable inferior vena cava.  LYMPH NODES: No adenopathy.  REPRODUCTIVE ORGANS: Unremarkable.    BONES/SOFT TISSUES: Right Multilevel degenerative changes of the spine.     ??      Impression       Mild interval decrease in the size of 1.1 cm right lower lobe nodule, previously measured 1.5 cm on 07/11/2016.    Multiple bibasilar micronodules, likely mucoid impacted airways.          CT chest/abdomen 07/11/2016  Impression     Right lower lobe pulmonary nodule, grossly unchanged.       CT chest abdomen and pelvis 04/11/2016  Impression     Grossly unchanged spiculated nodule in the right lower lobe. No new lung lesions identified.    Subcentimeter sclerotic lesion in the sternum, not seen on the chest CT dated 01/15/2015, indeterminate. Attention on follow-up.         Brain MRI 11/03/15:     Impression     No evidence of metastatic disease  to the brain.    New focus of signal abnormality within the right precentral gyrus without enhancement which may represent an interval, age-indeterminate infarct.       CT 01/18/16:  Impression     -Decreasing fissural nodularity about the right major minor fissures, decreasing left subpectoral/axillary lymphadenopathy, as well as, decrease in now trace right pleural effusion and pericardial effusion.   -Stable right lower lobe lesion. No new sites of disease identified.       PATHOLOGY:  02/09/2015  Final Diagnosis     A: Lymph node, right supraclavicular, fine needle aspiration  - Malignant cells present  - Consistent with metastatic lung adenocarcinoma (see comment) Immunohistochemical stains were performed and tumor cells are diffusely positive for TTF-1 and negative for p40, supporting the diagnosis of metastatic lung adenocarcinoma. Molecular testing will be performed and results will be reported separately.    02/09/2015  A. Pleural fluid, right, thoracentesis  - Malignant cells present  - Consistent with patient's known metastatic lung adenocarcinoma.    MOLECULAR TESTING:  +ALK re-arrangement on in house cytogenetics.

## 2017-01-19 ENCOUNTER — Ambulatory Visit
Admission: RE | Admit: 2017-01-19 | Discharge: 2017-01-19 | Disposition: A | Discharge status: Home / Self Care | Admitting: Pulmonary Disease

## 2017-01-19 ENCOUNTER — Ambulatory Visit
Admission: RE | Admit: 2017-01-19 | Discharge: 2017-01-19 | Disposition: A | Discharge status: Home / Self Care | Admitting: Physician Assistant

## 2017-01-19 DIAGNOSIS — C3491 Malignant neoplasm of unspecified part of right bronchus or lung: Principal | ICD-10-CM

## 2017-01-19 DIAGNOSIS — R0609 Other forms of dyspnea: Secondary | ICD-10-CM

## 2017-01-19 NOTE — Unmapped (Signed)
Nice to meet you. In order to help determine why you are short of breath and why there are abnormal shadows (ground glass) on the CT scan of your lungs, we will plan do do bronchoscopy tomorrow.     Your bronchoscopy procedure is scheduled for 01/20/2017 at 12 pm. Please plan to arrive 1 hour before your procedure is scheduled (11 am) and go to the Admitting/Registration area which is located immediately to the left of the information desk at [facility]. Please do not eat or drink anything after midnight the night before your procedure except for sips of water to take your morning medications. You need to have someone with you to drive you to and from the procedure.      Thank you for allowing [place] to participate in your care. Please do not hesitate to contact us with issues or questions.     [facility] Interventional Pulmonology  Tiana Loft, M.D.  Evalyn Casco, M.D.  Roby Lofts, M.D.    Fellow: Maxwell Caul, M.D.    During business hours please contact us via Tammy Allred at [phone] or page her at [phone].    For urgent issues after hours please call [phone] [facility] Operator) and ask to speak with the Interventional Pulmonary Fellow on call.

## 2017-01-20 ENCOUNTER — Ambulatory Visit
Admission: RE | Admit: 2017-01-20 | Discharge: 2017-01-20 | Disposition: A | Discharge status: Home / Self Care | Attending: Pulmonary Disease | Admitting: Pulmonary Disease

## 2017-01-20 NOTE — Unmapped (Signed)
See Provation note for details.

## 2017-01-20 NOTE — Unmapped (Signed)
PULMONARY CLINIC NEW PATIENT EVALUATION    Assessment and Plan:     Angelica [name] is a 66 year old woman with stage 4 lung adenocarcinoma on alectinib who has had progressive DOE with concern for possible drug induced pneumonitis. Symptoms started about 9 months after starting therapy. Progressively worse. No cough. Recent CT with R>L effusion, diffuse GGOs. Clinical picture c/b worsening over last 6 weeks while off lasix. Restarted lasix and stopped alectinib about 1 week ago with some improvement in symptoms. Other possible cause is atypical infection given her immunocomprimesed state. A bronchoscopy with BAL may help differentiate between these processes. Patient amenable for BAL so will move forward with scheduling.     Problem List Items Addressed This Visit        Respiratory    Primary adenocarcinoma of right lung (CMS-HCC)      Other Visit Diagnoses     Dyspnea on exertion                  This patient was seen and discussed with Dr. Camillo Flaming        History:     Referring Physician:   Referred Self  No address on file      Reason for Visit: DOE, abnormal CT    HPI:   Angelica [name] is a 66 year old woman with stage 4 lung adenocarcinoma on alectinib who has had progressive DOE with concern for possible drug induced pneumonitis. Started with DOE 3 months ago. This was after about 9 months of alectinib therapy per her report. Progressive worsening of DOE over last few months. Stopped lasix 6 weeks ago due to dehydration. No cough. No fever or chills. No weight loss. CT scan obtained 10/8 with GGOS, R>L pleural effusion. Given she is on TKI, concern for drug induced pneumonitis vs fluid overload. She reportedly had LE edema. She had Lasix restarted and alectinib held starting last week. She reports mild improvement in her symptoms after these interventions. No fever. No cough.     Past Medical History:   Diagnosis Date   ??? Asthma    ??? Cellulitis    ??? Diverticulitis    ??? Hypertension 04/27/2015   ??? Lung cancer, lower lobe (CMS-HCC)     RIGHT   ??? Renal failure     AKI with failure now resolved   ??? Seasonal allergies    ??? Shingles        Past Surgical History:   Procedure Laterality Date   ??? PR BRONCHOSCOPY,DIAGNOSTIC N/A 01/16/2015    Procedure: BRONCHOSCOPY, RIGID OR FLEXIBLE, W/WO FLUOROSCOPIC GUIDANCE; DIAGNOSTIC, WITH CELL WASHING, WHEN PERFORMED;  Surgeon: Bethanie Dicker, MD;  Location: [facility];  Service: Pulmonary   ??? PR DEBRIDEMENT, SKIN, SUB-Q TISSUE,MUSCLE,=<20 SQ CM Left 09/09/2016    Procedure: DEBRIDEMENT; SKIN, SUBCUTANEOUS TISSUE, & MUSCLE;  Surgeon: Thora Lance, MD;  Location: MAIN OR [facility];  Service: Vascular   ??? PR FINE NEEDLE ASP;W/IMAGING GUIDANCE Right 02/09/2015    Procedure: Fine Needle Aspiration; With Imaging Guidance;  Surgeon: Mathis Bud, MD;  Location: MAIN OR [facility];  Service: Pulmonary   ??? PR SPLIT GRFT TRUNK,ARM,LEG <100 SQCM Left 08/16/2016    Procedure: Split-Thickness Autograft, Trunk, Arms, Legs; 1st 100 Sq Cm Or Less, Or 1% Body Area Infant Or Child;  Surgeon: Thora Lance, MD;  Location: MAIN OR [facility];  Service: Vascular   ??? PR THORACENTESIS NEEDLE/CATH PLEURA W/IMAGING Right 02/09/2015    Procedure: Thoracentesis W/ Imaging;  Surgeon: Barbara Cower  Felipa Furnace, MD;  Location: MAIN OR ;  Service: Pulmonary       Current Outpatient Prescriptions   Medication Sig Dispense Refill   ??? ALECENSA 150 mg capsule TAKE 4 CAPSULES (600 MG) BY MOUTH TWICE DAILY WITH MEALS 240 capsule 11   ??? cetirizine (ZYRTEC) 10 MG tablet Take 10 mg by mouth nightly.      ??? elastic bandage (SUREPRESS HIGH COMPRESSION) 4 X 3.2 -yard [employer] Apply 1 application topically daily. Apply as directed; remove at night 1 each 0   ??? foam bandage 2  Bndg Apply 1 application topically daily. 1 each 0   ??? furosemide (LASIX) 20 MG tablet Take 1 tablet (20 mg total) by mouth daily. 60 tablet 6   ??? gabapentin (NEURONTIN) 300 MG capsule Take 3 capsules (900 mg total) by mouth nightly. 90 capsule 5   ??? HYDROcodone-acetaminophen (NORCO) 5-325 mg per tablet Take 1 tablet by mouth Every six (6) hours. 120 tablet 0   ??? LORazepam (ATIVAN) 0.5 MG tablet Take 1-2 po QHS or as needed 60 tablet 0   ??? pantoprazole (PROTONIX) 20 MG tablet Take 1 tablet (20 mg total) by mouth daily. 30 tablet 1   ??? potassium chloride 20 mEq TbER Take 20 mEq by mouth Two (2) times a day. 60 tablet 0   ??? prochlorperazine (COMPAZINE) 10 MG tablet TAKE 1 TABLET BY MOUTH EVERY 6 HOURS AS NEEDED FOR NAUSEA 60 tablet 3   ??? ranitidine (ZANTAC) 150 MG tablet Take 150 mg by mouth continuous as needed for heartburn.     ??? sertraline (ZOLOFT) 100 MG tablet TAKE 1 AND 1/2 TABLETS BY MOUTH EVERY DAY 45 tablet 6   ??? triamcinolone (KENALOG) 0.1 % cream Apply topically Two (2) times a day. 85 g 2   ??? VASHE WOUND THERAPY 0.033 % IrSl IRRIGATE WITH 16 OZ AS DIRECTED TWICE DAILY 3000 mL 0   ??? albuterol (PROAIR HFA) 90 mcg/actuation inhaler Inhale 2 puffs every six (6) hours as needed for wheezing. 1 Inhaler 3   ??? lisinopril (PRINIVIL,ZESTRIL) 10 MG tablet Take 1 tablet (10 mg total) by mouth daily. 90 tablet 3     No current facility-administered medications for this visit.        Allergies as of 01/19/2017 - Reviewed 01/19/2017   Allergen Reaction Noted   ??? Daptomycin Other (See Comments) 02/01/2015   ??? Dilaudid [hydromorphone] Nausea And Vomiting 01/31/2015       Family History   Problem Relation Age of Onset   ??? Breast cancer Mother    ??? Multiple myeloma Sister    ??? Leukemia Brother    ??? Early death Father    ??? Heart disease Father    ??? Anesthesia problems Neg Hx    ??? Bleeding Disorder Neg Hx        Social History     Social History   ??? Marital status: Married     Spouse name: N/A   ??? Number of children: N/A   ??? Years of education: 24     Occupational History   ??? Not on file.     Social History Main Topics   ??? Smoking status: Never Smoker   ??? Smokeless tobacco: Never Used   ??? Alcohol use 0.0 oz/week      Comment: OCCASIONAL    ??? Drug use: No   ??? Sexual activity: Yes     Partners: Male Birth control/ protection: None     Other  Topics Concern   ??? Not on file     Social History Narrative    Married, mother of 2. 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    Objective:     Physical Examination:   General appearance - alert, well appearing, and in no distress  Vitals:   Vitals:    01/19/17 1449   BP: 157/68   Pulse: 87   Resp: 18   Temp: 36.6 ??C   SpO2: 94%     Eyes - Sclera anicteric, conjunctiva pink  Mouth - mucous membranes moist, pharynx normal without lesions  Neck - Trachea supple and midline, (-) JVD  Lymphatics - no palpable lymphadenopathy  Heart - normal rate, regular rhythm, normal S1, S2, no murmurs, rubs, clicks or gallops  Chest - clear to auscultation, no wheezes, rales or rhonchi, symmetric air entry  Abdomen - soft, nontender, nondistended, no masses or organomegaly  Extremities - No pedal edema, no clubbing or cyanosis  Skin - normal coloration and turgor, no rashes, no suspicious skin lesions noted  Neurological - alert, oriented, normal speech, no focal findings or movement disorder noted    Labs and Imaging:   Reviewed and as per EMR

## 2017-01-24 NOTE — Unmapped (Signed)
Ms. Goto is having a biopsy and her Serita Butcher is on hold until she receives the results.  I will call her back on Monday, 01/30/17.

## 2017-01-26 NOTE — Unmapped (Signed)
Oncology Telephone Note    Spoke to Ms. Badeaux in follow up regarding her dyspnea. She feels like her symptoms initially improved within 1-2 days of starting furosemide and stopping alectinib. She now feels like she has plateaued, and her symptoms are not getting any better. She can walk to the mailbox without too much trouble, but became very winded walking around [place].     We will bring her back to see [place] on 02/02/17. In the meantime, she will continue furosemide and hold alectinib. We will try to arrange for a CT chest the same day. If she is clearly hypervolemic on exam, we will d/c the CT and uptitrate her diuretic.    Wendall Papa, MD, MPH  Hematology-Oncology Fellow

## 2017-01-27 NOTE — Unmapped (Signed)
Spoke with patient regarding symptoms.     She initially had improvement in symptoms after stopping acletinib and starting lasix. Since then improvement has plateaued and she remains dyspneic.   Given that her improvement has plateaued ?if initial improvement due to restarting lasix and she continues to have reaction with acletinib.   She is scheduled to see Dr. Nedra Hai next week with CT scan prior to that visit. If CT scan continues to have GGO low threshold to consider steroids.     Maxwell Caul, MD  7:37 PM, January 26, 2017  Interventional Pulmonary Fellow  Pager #: [phone]

## 2017-02-01 ENCOUNTER — Ambulatory Visit: Admission: RE | Admit: 2017-02-01 | Discharge: 2017-02-01 | Disposition: A | Discharge status: Home / Self Care

## 2017-02-01 DIAGNOSIS — C3490 Malignant neoplasm of unspecified part of unspecified bronchus or lung: Principal | ICD-10-CM

## 2017-02-02 ENCOUNTER — Ambulatory Visit: Admission: RE | Admit: 2017-02-02 | Discharge: 2017-02-02 | Disposition: A | Discharge status: Home / Self Care

## 2017-02-02 ENCOUNTER — Ambulatory Visit
Admission: RE | Admit: 2017-02-02 | Discharge: 2017-02-02 | Disposition: A | Discharge status: Home / Self Care | Attending: Hematology & Oncology

## 2017-02-02 DIAGNOSIS — R0609 Other forms of dyspnea: Secondary | ICD-10-CM

## 2017-02-02 DIAGNOSIS — C3490 Malignant neoplasm of unspecified part of unspecified bronchus or lung: Principal | ICD-10-CM

## 2017-02-02 DIAGNOSIS — C3491 Malignant neoplasm of unspecified part of right bronchus or lung: Principal | ICD-10-CM

## 2017-02-02 DIAGNOSIS — J810 Acute pulmonary edema: Secondary | ICD-10-CM

## 2017-02-02 LAB — BASIC METABOLIC PANEL
ANION GAP: 15 mmol/L (ref 9–15)
BLOOD UREA NITROGEN: 11 mg/dL (ref 7–21)
BUN / CREAT RATIO: 17
CALCIUM: 9.7 mg/dL (ref 8.5–10.2)
CHLORIDE: 103 mmol/L (ref 98–107)
CO2: 16 mmol/L — ABNORMAL LOW (ref 22.0–30.0)
CREATININE: 0.63 mg/dL (ref 0.60–1.00)
EGFR MDRD AF AMER: >=60 mL/min/{1.73_m2} (ref >=60)
EGFR MDRD NON AF AMER: >=60 mL/min/{1.73_m2} (ref >=60)
GLUCOSE RANDOM: 88 mg/dL (ref 65–179)
POTASSIUM: 4.1 mmol/L (ref 3.5–5.0)

## 2017-02-02 LAB — BLOOD UREA NITROGEN: Urea nitrogen:MCnc:Pt:Ser/Plas:Qn:: 11

## 2017-02-02 LAB — PRO-BNP: Natriuretic peptide.B prohormone N-Terminal:MCnc:Pt:Ser/Plas:Qn:: 236 — ABNORMAL HIGH

## 2017-02-02 NOTE — Unmapped (Signed)
It was great seeing you.    We are glad you are feeling better. Please restart your alectinib. Continue furosemide.    We will obtain an echocardiogram.    We'll see you back in 2 weeks.

## 2017-02-02 NOTE — Unmapped (Signed)
REFERRING PROVIDER:   Rae Lips, Fnp  [address]  [address]  [facility]  [place], [place] [address]    PRIMARY CARE PROVIDER:  Quentin Ore, FNP  [address] [facility]  [place] [address]    CONSULTING PROVIDERS:  Dr. Jackelyn Poling    DIAGNOSIS: Stage IV adenoCA Lung  MOLECULAR PROFILE: +ALK Re-arrangment  CURRENT TREATMENT: alectinib since Aug 2017  PREVIOUS TREATMENT: crizotinib x22mos  REASON FOR VISIT: Ms.  KHANIYAH [name], a 66 y.o. female is seen today for further mgmt of stage IV adenocarcinoma of the lung.    ASSESSMENT/PLAN  # Stage IV adenocarcinoma of the lung; ALK+.  Metastases to bilateral supraclavicular, mediastinal, and R hilar node, T7 and T10, and right pleural fluid. CT scans on 11/03/2015 indicated progression of disease.  Switched therapy to alectinib and recent scans indicate response. Alectinib held starting 01/12/17 due to new onset dyspnea and GGO of unclear etiology, see below. Will resume alectinib today (02/02/17) and monitor closely for signs or symptoms of drug-induced ILD.     # Dyspnea on exertion with new groundglass opacities on CT chest, 01/2017: Etiology remains uncertain. Held alectinib and restarted furosemide with improvement in symptoms. Bronchoscopy negative for infectious etiologies, otherwise unrevealing of a definitive cause. Pulmonology agrees with Ddx of pulmonary edema vs drug-induced ILD. Repeat CT chest looks stable to slightly improved. Suspect that this was primarily due to hypervolemia causing pulmonary edema, given timing correlating with discontinuation of furosemide, hypervolemic on exam at last visit, and prompt improvement with diuresis. Cannot fully explain GGOs and cannot exclude alectinib-induced ILD. Will defer initiation of steroids and restart alectinib. Will have low threshold to start steroids if symptoms recur.   - Continue furosemide 20 mg po daily  - Echocardiogram to evaluate for underlying cardiomyopathy; depending on these results, may warrant referral to cardiology to optimize cardiac function    # Ulcerated lipodermatosclerosis of LLE;  Likely 2/2 chronic venous insufficiency.  Being followed closely by wound care.    # HTN:  Cont current meds.  PCP will continue to assist w/ management     # Hx of Rhabdomyolysis likely secondary to daptomycin, acute kidney injury secondary to rhabdo and HTN (resolved).    # Depression. Appreciate [facility]SP support.  [facility] following. Iimproved with anti-depressant, will continue. She also uses ativan prn.    # Chronic Pain: cont hydrocodone/APAP prn. Neuropathic in nature and mostly at night. Improved significantly with gabapentin qhs.    RTC in 2 weeks    This patient was seen and discussed with Dr. Nedra Hai.    Wendall Papa, MD, MPH  Hematology-Oncology Fellow        HISTORY OF PRESENT ILLNESS: [name] presents today as a new patient for further evaluation of stage IV adenocarcinoma of the lung. Please see below in oncology history for further details. In summary, patient presented with nausea and vomiting, and underwent workup including CT scan of the abdomen, which revealed 2.8 cm lung nodule in the right lower lung base. Further staging workup including PET CT scan revealed R lung nodules/masses. Bilateral supraclavicular, mediastinal, and R hilar node mets. Bone mets T7 and T10. Mod sized R pleural effusion, small L pleural effusion. Thoracentesis of right pleural effusion showed malignant cells. Right subclavian lymph node FNA also showed pathologic findings consistent with adenocarcinoma of lung primary.     Patient was previously treated for suspected cellulitis of the  left lower extremity at [facility] from 10/12-10/27/16 and was discharged on IV daptomycin, IV ertapenem, and Pulse Vac therapy. She was readmitted on 02/01/15 due to weakness and rhabdomyolysis likely secondary to daptomycin. On 02/04/15 she was transferred to [facility] for further management. She was initially started on IV Zyvox and continued on the IV Invanz. Dermatology was consulted and the patient's presentation was thought to be more consistent with lipodermatosclerosis. Tissue biopsy was consistent with lipodermatosclerosis secondary to chronic venous stasis.    INTERVAL HX:   Angelica [name] was last seen in clinic on 01/12/17. At that time, she reported severe dyspnea on exertion, and we held her alectinib and restarted furosemide. She also had a bronchoscopy which was negative for infectious etiologies. A repeat CT chest showed stable to slight improvement in GGOs and decreased size of pleural and pericardial effusions. She feels much better. She is no longer short of breath and was able to walk around [employer] this week without difficulty. She feels like she is back to baseline. She has a dry, non-productive cough intermittently, which is not changed from baseline. Her LE is improved. Denies abdominal distension or other swelling. Denies fevers or chills. Denies wheezing. Has runny nose, attributes to allergies. Overall, feels well. She is accompanied today by her mother, visiting from [facility].      Oncology History    11        Primary adenocarcinoma of right lung (CMS-HCC)    01/14/2015 -  Presenting Symptoms     Presented with nausea, vomiting and diarrhea. Underwent abdominal CT scan, which incidentally found 2.8 cm nodule in the right lung base with multiple w/ additional sub cm ground-glass nodules.         01/16/2015 Biopsy     EBUS Bx of RLL - negative for malignancy         02/05/2015 -  Cancer Staged     PET/CT : FDG avid R lung nodules/masses. Bilateral supraclavicular, mediastinal, and R hilar node mets. Bone mets T7 and T10. Mod sized R pleural effusion, small L pleural effusion. Mild FDG avid b/l iliac, inguinal, femoral LNs - likely benign.         02/09/2015 Procedure     R thoracentesis 750cc removed - positive for malignancy.          02/09/2015 Biopsy     R supraclavicular LN FNA - positive for malignancy.            PAST MEDICAL HISTORY  Past Medical History:   Diagnosis Date   ??? Asthma    ??? Cellulitis    ??? Diverticulitis    ??? Hypertension 04/27/2015   ??? Lung cancer, lower lobe (CMS-HCC)     RIGHT   ??? Renal failure     AKI with failure now resolved   ??? Seasonal allergies    ??? Shingles        Allergies   Allergen Reactions   ??? Daptomycin Other (See Comments)     Rhabdomyolysis   ??? Dilaudid [Hydromorphone] Nausea And Vomiting       MEDICATIONS  Current Outpatient Prescriptions on File Prior to Visit   Medication Sig Dispense Refill   ??? ALECENSA 150 mg capsule TAKE 4 CAPSULES (600 MG) BY MOUTH TWICE DAILY WITH MEALS 240 capsule 11   ??? cetirizine (ZYRTEC) 10 MG tablet Take 10 mg by mouth nightly.      ??? elastic bandage (SUREPRESS HIGH COMPRESSION) 4 X 3.2 -yard [employer]  Apply 1 application topically daily. Apply as directed; remove at night 1 each 0   ??? foam bandage 2  Bndg Apply 1 application topically daily. 1 each 0   ??? furosemide (LASIX) 20 MG tablet Take 1 tablet (20 mg total) by mouth daily. 60 tablet 6   ??? gabapentin (NEURONTIN) 300 MG capsule Take 3 capsules (900 mg total) by mouth nightly. 90 capsule 5   ??? HYDROcodone-acetaminophen (NORCO) 5-325 mg per tablet Take 1 tablet by mouth Every six (6) hours. 120 tablet 0   ??? LORazepam (ATIVAN) 0.5 MG tablet Take 1-2 po QHS or as needed 60 tablet 0   ??? pantoprazole (PROTONIX) 20 MG tablet Take 1 tablet (20 mg total) by mouth daily. 30 tablet 1   ??? potassium chloride 20 mEq TbER Take 20 mEq by mouth Two (2) times a day. 60 tablet 0   ??? prochlorperazine (COMPAZINE) 10 MG tablet TAKE 1 TABLET BY MOUTH EVERY 6 HOURS AS NEEDED FOR NAUSEA 60 tablet 3   ??? ranitidine (ZANTAC) 150 MG tablet Take 150 mg by mouth continuous as needed for heartburn.     ??? sertraline (ZOLOFT) 100 MG tablet TAKE 1 AND 1/2 TABLETS BY MOUTH EVERY DAY 45 tablet 6   ??? triamcinolone (KENALOG) 0.1 % cream Apply topically Two (2) times a day. 85 g 2   ??? VASHE WOUND THERAPY 0.033 % IrSl IRRIGATE WITH 16 OZ AS DIRECTED TWICE DAILY 3000 mL 0   ??? albuterol (PROAIR HFA) 90 mcg/actuation inhaler Inhale 2 puffs every six (6) hours as needed for wheezing. 1 Inhaler 3   ??? lisinopril (PRINIVIL,ZESTRIL) 10 MG tablet Take 1 tablet (10 mg total) by mouth daily. 90 tablet 3     No current facility-administered medications on file prior to visit.        FAMILY HISTORY  Cancer-related family history includes Breast cancer in her mother.    SOCIAL HISTORY  she is a never smoker, no Etoh.    - Married, mother of 2. 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     PHYSICAL EXAM  ECOG Performance Status: 1  Vitals:    02/02/17 1456   BP: 154/77   Pulse: 88   Resp: 18   Temp: 36.4 ??C (97.6 ??F)   SpO2: 96%     GEN: Pleasant, well-appearing woman in NAD, walker in room  HEENT: Pupils equally round, sclerae anicteric, conjunctiva clear, moist mucous membranes, no oral lesions or exudates, slight right lid lag compared to left  NECK: Supple, no JVD at 90 degrees  HEME/LYMPH: No palpable cervical or supraclavicular lymphadenopathy  CV: Normal rate, regular rhythm, normal S1 and S2, no murmurs, rubs, or gallops; extremities warm, well-perfused, trace R lower extremity edema; LLE wrapped  RESP: Lungs clear to auscultation bilaterally, no wheezes, ronchi or rales, normal work of breathing  GI: Soft, non-tender, non-distended, normoactive bowel sounds, no masses or organomegaly  SKIN AND SUBCUTANEOUS TISSUES: Ecchymoses on bilateral hands, no rash  NEURO: Alert and oriented, speech intact, following commands, moving all extremities well, no focal deficits appreciated  PSYCH: Normal mood and appropriate affect      DIAGNOSTIC DATA: I have personally reviewed the patient's records in EPIC including but not limited to clinic notes, admission notes, operative reports, pathology reports, lab results and imaging  results.     Lab Results   Component Value Date    WBC 3.8 (L) 01/12/2017    HGB 11.8 (L) 01/12/2017    HCT 34.0 (L) 01/12/2017    PLT 215 01/12/2017       Lab Results   Component Value Date    NA 134 (L) 02/02/2017    K 4.1 02/02/2017    CL 103 02/02/2017    CO2 16.0 (L) 02/02/2017    BUN 11 02/02/2017    CREATININE 0.63 02/02/2017    GLU 88 02/02/2017    CALCIUM 9.7 02/02/2017    MG 1.7 09/12/2016    PHOS 4.8 (H) 12/07/2016       Lab Results   Component Value Date    BILITOT 0.6 01/12/2017    BILIDIR <0.10 02/04/2015    PROT 7.5 01/12/2017    ALBUMIN 3.8 01/12/2017    ALT 39 01/12/2017    AST 44 (H) 01/12/2017    ALKPHOS 199 (H) 01/12/2017       Lab Results   Component Value Date    INR 1.07 08/13/2015    APTT 25.1 08/13/2015         RADIOLOGY RESULTS: I have personally reviewed the patient's CT scan or PET scan on PACS that shows the following results.     CT chest without contrast, 02/01/17:  Stable appearing 1.7 cm right lower lobe nodule, unchanged since 10/11/2016 when remeasured in similar manner and location.     Interval decrease in small right pleural effusion. Trace pericardial effusion.    Stable appearing diffusely scattered groundglass opacities in bilateral lungs; infection/inflammation. Recommend follow-up CT in 6-8 weeks to ensure clearing.    CT chest, abdomen with contrast, 01/09/17:  Impression     --Stable right lower lobe nodule measuring 1.7 cm; unchanged since 10/11/2016 when remeasured in similar manner and location    --New small right pleural effusion. Small pericardial effusion.    --Diffusely scattered groundglass opacities in bilateral lungs; nonspecific, representing infection/inflammation. Recommend follow-up chest CT in 6-8 weeks to ensure clearing.       MRI Brain w wo contrast 12/13/2016  Impression     No evidence of metastatic disease.  ??         CT C/A/P 10/10/2016  Impression Mild interval decrease in the size of 1.1 cm right lower lobe nodule, previously measured 1.5 cm on 07/11/2016.    Multiple bibasilar micronodules, likely mucoid impacted airways.        10/10/16  FINDINGS:   CHEST:  AIRWAYS:  Clear central airways.    Bibasilar paravertebral linear opacities, likely scarring.    Interval decrease in the size of right lower lobe nodule measuring 1.1 cm (series 3, image 63), previously measured 1.5 cm when remeasured in a similar manner and location. Few bibasilar micronodules, likely mucoid impacted airways.    No pleural effusion.    MEDIASTINUM:    Normal heart size. No pericardial effusion.    Normal caliber thoracic aorta.    No lymphadenopathy.    ABDOMEN/PELVIS:  HEPATOBILIARY: New low-attenuation lesions in the right hepatic lobe measuring approximately 1.7 cm (1:28) and 1.1 cm (1:20). Diffuse hepatic steatosis.  The spleen, pancreas, right lateral adrenal glands are unremarkable.  KIDNEYS/URETERS: Subcentimeter low-attenuation lesion in left kidney, too small to characterize by CT but likely simple cyst.  BLADDER: Unremarkable.  BOWEL/PERITONEUM/RETROPERITONEUM: No bowel obstruction. No acute inflammatory process. No ascites. Diverticulosis without diverticulitis.Small fat-containing umbilical hernia.  VASCULATURE: Abdominal aorta within  normal limits for patient's age. Unremarkable inferior vena cava.  LYMPH NODES: No adenopathy.  REPRODUCTIVE ORGANS: Unremarkable.    BONES/SOFT TISSUES: Right Multilevel degenerative changes of the spine.     ??      Impression       Mild interval decrease in the size of 1.1 cm right lower lobe nodule, previously measured 1.5 cm on 07/11/2016.    Multiple bibasilar micronodules, likely mucoid impacted airways.          CT chest/abdomen 07/11/2016  Impression     Right lower lobe pulmonary nodule, grossly unchanged.       CT chest abdomen and pelvis 04/11/2016  Impression     Grossly unchanged spiculated nodule in the right lower lobe. No new lung lesions identified.    Subcentimeter sclerotic lesion in the sternum, not seen on the chest CT dated 01/15/2015, indeterminate. Attention on follow-up.         Brain MRI 11/03/15:     Impression     No evidence of metastatic disease to the brain.    New focus of signal abnormality within the right precentral gyrus without enhancement which may represent an interval, age-indeterminate infarct.       CT 01/18/16:  Impression     -Decreasing fissural nodularity about the right major minor fissures, decreasing left subpectoral/axillary lymphadenopathy, as well as, decrease in now trace right pleural effusion and pericardial effusion.   -Stable right lower lobe lesion. No new sites of disease identified.       PATHOLOGY:  02/09/2015  Final Diagnosis     A: Lymph node, right supraclavicular, fine needle aspiration  - Malignant cells present  - Consistent with metastatic lung adenocarcinoma (see comment)   Immunohistochemical stains were performed and tumor cells are diffusely positive for TTF-1 and negative for p40, supporting the diagnosis of metastatic lung adenocarcinoma. Molecular testing will be performed and results will be reported separately.    02/09/2015  A. Pleural fluid, right, thoracentesis  - Malignant cells present  - Consistent with patient's known metastatic lung adenocarcinoma.    MOLECULAR TESTING:  +ALK re-arrangement on in house cytogenetics.

## 2017-02-03 ENCOUNTER — Ambulatory Visit
Admission: RE | Admit: 2017-02-03 | Discharge: 2017-02-03 | Disposition: A | Discharge status: Home / Self Care | Admitting: Physician Assistant

## 2017-02-03 DIAGNOSIS — L97921 Non-pressure chronic ulcer of unspecified part of left lower leg limited to breakdown of skin: Principal | ICD-10-CM

## 2017-02-03 DIAGNOSIS — I890 Lymphedema, not elsewhere classified: Secondary | ICD-10-CM

## 2017-02-03 NOTE — Unmapped (Signed)
Dressing:    Wash area with Antibacterial liquid soap with one wash cloth, clean the soap off with a clean cloth, and then pat dry with a dry cloth before the dressing change.    Continue with you current dressing of adaptic touch, draw tex, abd, kerlix, Surepress      If you have any questions or concerns regarding your wound or wound care, please contact us at the [facility] clinic at [phone].    If your wound starts to develop the following , please call the [facility] for further advise:    ??  Increased drainage  ??  Redness around the wound  ??  Strong odor from the wound when changing the bandages  ??  Increased pain    Please do not hesitate to leave a voicemail on the nurse line. We make every effort to return your call the same day or the next day. Please leave a clear message with your name, date of birth,  and your medical record number. Leave a brief description of your problem.    If you are experiencing the following, please call us for advise or consider going to the nearest local Emergency Department or call 911.    ??  Fever of 100 F  ??  Nausea or Vomitting  ??  Pus draining from your wound  ??  Redness of the whole foot or leg  ??  Severe increase in pain above your baseline.    [facility]  [phone]

## 2017-02-06 NOTE — Unmapped (Signed)
[facility] Refill Coordination Note  Specialty Medication(s): Alecensa 150mg     Angelica [name], DOB: Jan 26, 1951  Phone: [phone] (home) , Alternate phone contact: N/A  Phone or address changes today?: No  All above HIPAA information was verified with patient.  Shipping Address: [address]  [place] [address]   Insurance changes? No    Completed refill call assessment today to schedule patient's medication shipment from the [facility] [phone]).      Confirmed the medication and dosage are correct and have not changed: Yes, regimen is correct and unchanged.    Confirmed patient started or stopped the following medications in the past month:  No, there are no changes reported at this time.    Are you tolerating your medication?:  Angelica [name] reports tolerating the medication.    ADHERENCE        Did you miss any doses in the past 4 weeks? No missed doses reported.    FINANCIAL/SHIPPING    Delivery Scheduled: Yes, Expected medication delivery date: 02/14/17     Angelica [name] did not have any additional questions at this time.    Delivery address validated in [facility] scheduling system: Yes, address listed in [facility] is correct.    We will follow up with patient monthly for standard refill processing and delivery.      Thank you,  [employer]   [facility] Specialty Pharmacist

## 2017-02-06 NOTE — Unmapped (Signed)
Patient Active Problem List   Diagnosis   ??? Hypokalemia   ??? Cellulitis of left lower extremity   ??? Post-herpetic polyneuropathy   ??? Diarrhea   ??? Malnutrition (CMS-HCC)   ??? Anemia   ??? Weakness   ??? Acute cystitis without hematuria   ??? Non-traumatic rhabdomyolysis   ??? Elevated LFTs   ??? Primary adenocarcinoma of right lung (CMS-HCC)   ??? Hypoalbuminemia   ??? Need for pneumococcal vaccination   ??? Leg ulcer, left (CMS-HCC)   ??? Hypertension   ??? Vasculitis determined by biopsy of skin (CMS-HCC)   ??? Dermatitis   ??? Lymphedema of left leg   ??? Risk for falls   ??? Elevated serum creatinine   ??? S/P split thickness skin graft   ??? Colitis due to Clostridium difficile   ??? Skin ulcer of left great toe, limited to breakdown of skin (CMS-HCC)     Angelica [name] is a very pleasant 66 year old female with the above medical problems who returns for routine follow-up of her left leg ulcers.  These are related to biopsy-proven vasculitis, lymphedema, and she is status post split-thickness skin graft in May 2018 for her nonhealing ulcer.  She has had recent increased lower extremity edema due to recent holding of her Lasix.  She feels this caused her wounds to [facility] in its improvement.  She has been since restarted on her Lasix after extensive workup through [facility] oncology.  She has underlying stage IV adenocarcinoma of the lung.  She saw oncology yesterday who restarted her chemotherapy oral medication.  She continues on her Lasix as well.  There is still moderate drainage from the wound.  She uses Drawtex due to the moderate drainage and SurePress compression wrap for swelling control.  No recent fevers or chills or night sweats or any other new concerns related to the wound.    Physical exam    General: 67 year old female in no apparent distress.    Blood pressure 131/71, pulse 82, temperature 36.1 ??C (97 ??F), temperature source Temporal, not currently breastfeeding.    Wound Back Left;Upper pink unapproximated no drainage (Active)       Wound 09/15/16 Leg Left (Active)   Wound Status Not Healed 02/03/2017 11:18 AM   Pain 2 02/03/2017 11:18 AM   Dressing Status      Removed;Other (Comment) 02/03/2017 11:18 AM   Wound Length (cm)      9.1 02/03/2017 11:18 AM   Wound Width (cm)      24.1 02/03/2017 11:18 AM   Wound Depth (cm)      0.1 02/03/2017 11:18 AM   Area (sq cm) 219.31 02/03/2017 11:18 AM   Volume (mL) 21.93 02/03/2017 11:18 AM   Wound Length % Healed (Calculated) -5.81 % 02/03/2017 11:18 AM   Wound Width % Healed (Calculated) 1.23 % 02/03/2017 11:18 AM   Wound Depth % Healed   50 % 02/03/2017 11:18 AM   Area (sq cm) % Healed (Calculated) -4.51 % 02/03/2017 11:18 AM   Volume (mL) % Healed   -4.51 % 02/03/2017 11:18 AM   Odor None 02/03/2017 11:18 AM   Margins Undefined edges 02/03/2017 11:18 AM   Peri-wound Assessment      Clean;Pink;Scar tissue 02/03/2017 11:18 AM   Encounter Subsequent 02/03/2017 11:18 AM   Slough % 1-25% 02/03/2017 11:18 AM   Granulation % 76-100% 02/03/2017 11:18 AM   Exudate Type      Sero-sanguineous 02/03/2017 11:18 AM   Exudate Amnt  Large 02/03/2017 11:18 AM   Thickness Full Thickness 02/03/2017 11:18 AM   Paring No 02/03/2017 11:18 AM   Texture Edema 02/03/2017 11:18 AM   Moisture Moist 02/03/2017 11:18 AM   Hypergranuation No 02/03/2017 11:18 AM   Tunneling      No 02/03/2017 11:18 AM   Undermining     No 02/03/2017 11:18 AM   Sinus Tract No 02/03/2017 11:18 AM   Treatments Cleansed/Irrigation;Pharmaceutical agent;Other (Comment) 02/03/2017 11:18 AM   Picture Taken Yes 02/03/2017 11:18 AM   Dressing Other (Comment) 02/03/2017 12:22 PM     Extremities: On the anterior and posterior distal left calf there is significant improvement with new epithelialization.  There is still scattered areas of full-thickness ulcer but the majority are healing.  However on the medial and lateral malleolar regions there are continued full-thickness ulcers which have not improved much over the past month or 2.  This may be related to increased swelling as described above which has delayed wound healing.  Despite all of this her swelling is now under much better control now that she is started back on her diuretic.  The wounds are extremely clean with no evidence of biofilm or necrotic tissue.  Pedal pulses are strongly palpable.    Impression: Left calf ulcer related to lymphedema and biopsy-proven vasculitis with stalled wound healing    Treatment plan    1.  I have taken a culture however I have a low suspicion that the stalled wound healing is related to bacterial colonization or infection.  More likely this is related to her recent time of not using Lasix and the poorly controlled swelling contributed to wound healing delay.  Unless there is a heavy growth of a resistant organism I would like to hold off on any antibiotic therapy given her severe history of C. difficile colitis.    2.  We have a new full-thickness autologous graft device and that we are going to be using in the clinic.  This would avoid coming into the hospital.  This would be autologous full-thickness plugs that are taken from either of her thighs and placed into the areas requiring extra wound healing.  She is willing to consider this.  We did not have this yet in the clinic but I will certainly keep her on top consideration to help expedite wound healing.  She has already proven that despite all of her medical problems she has the ability to heal and has considerably done so after her split-thickness skin graft.    3.  Continue with Drawtex for absorption and continue with SurePress to control swelling.    4.  Return to clinic in 2 weeks for routine evaluation and dressing change and return precautions have been reviewed in detail.

## 2017-02-08 MED ORDER — MINOCYCLINE 100 MG CAPSULE
ORAL_CAPSULE | Freq: Two times a day (BID) | ORAL | 0 refills | 0.00000 days | Status: CP
Start: 2017-02-08 — End: 2017-02-18

## 2017-02-09 NOTE — Unmapped (Signed)
Addended by: Enid Baas on: 02/08/2017 04:31 PM     Modules accepted: Orders

## 2017-02-09 NOTE — Unmapped (Signed)
Called pt to inform her of her culture results and the ABX that [facility] called into her pharmacy for her. I also spoke with her about our concerns of her past HX of C-Diff and if she develops nausea or vomiting she is to call the clinic so we can get her tested for C-Diff immediately. Pt verbalized understanding of the conversation.

## 2017-02-12 MED FILL — ALECENSA/150MG/CAPS: ALECENSA/150MG/CAPS | 30 days supply | Qty: 240 | Fill #4

## 2017-02-16 ENCOUNTER — Ambulatory Visit: Admission: RE | Admit: 2017-02-16 | Discharge: 2017-02-16 | Disposition: A | Discharge status: Home / Self Care

## 2017-02-16 ENCOUNTER — Ambulatory Visit
Admission: RE | Admit: 2017-02-16 | Discharge: 2017-02-16 | Disposition: A | Discharge status: Home / Self Care | Admitting: Hematology & Oncology

## 2017-02-16 DIAGNOSIS — C3491 Malignant neoplasm of unspecified part of right bronchus or lung: Principal | ICD-10-CM

## 2017-02-16 DIAGNOSIS — C3490 Malignant neoplasm of unspecified part of unspecified bronchus or lung: Principal | ICD-10-CM

## 2017-02-16 DIAGNOSIS — J810 Acute pulmonary edema: Secondary | ICD-10-CM

## 2017-02-16 LAB — PLATELET COUNT: Lab: 195

## 2017-02-16 LAB — COMPREHENSIVE METABOLIC PANEL
ALBUMIN: 3.7 g/dL (ref 3.5–5.0)
ALKALINE PHOSPHATASE: 188 U/L — ABNORMAL HIGH (ref 38–126)
ANION GAP: 8 mmol/L — ABNORMAL LOW (ref 9–15)
AST (SGOT): 24 U/L (ref 14–38)
BILIRUBIN TOTAL: 0.6 mg/dL (ref 0.0–1.2)
BLOOD UREA NITROGEN: 46 mg/dL — ABNORMAL HIGH (ref 7–21)
BUN / CREAT RATIO: 44
CALCIUM: 8.7 mg/dL (ref 8.5–10.2)
CHLORIDE: 103 mmol/L (ref 98–107)
CO2: 25 mmol/L (ref 22.0–30.0)
CREATININE: 1.05 mg/dL — ABNORMAL HIGH (ref 0.60–1.00)
EGFR MDRD AF AMER: >=60 mL/min/{1.73_m2} (ref >=60)
EGFR MDRD NON AF AMER: 53 mL/min/{1.73_m2} — ABNORMAL LOW (ref >=60)
GLUCOSE RANDOM: 88 mg/dL (ref 65–179)
POTASSIUM: 4.3 mmol/L (ref 3.5–5.0)
PROTEIN TOTAL: 7.8 g/dL (ref 6.5–8.3)
SODIUM: 136 mmol/L (ref 135–145)

## 2017-02-16 LAB — CBC W/ AUTO DIFF
BASOPHILS ABSOLUTE COUNT: 0 10*9/L (ref 0.0–0.1)
EOSINOPHILS ABSOLUTE COUNT: 0 10*9/L (ref 0.0–0.4)
HEMATOCRIT: 35.5 % — ABNORMAL LOW (ref 36.0–46.0)
HEMOGLOBIN: 12 g/dL (ref 12.0–16.0)
LYMPHOCYTES ABSOLUTE COUNT: 1.1 10*9/L — ABNORMAL LOW (ref 1.5–5.0)
MEAN CORPUSCULAR HEMOGLOBIN CONC: 33.8 g/dL (ref 31.0–37.0)
MEAN CORPUSCULAR HEMOGLOBIN: 31.4 pg (ref 26.0–34.0)
MEAN CORPUSCULAR VOLUME: 92.9 fL (ref 80.0–100.0)
MEAN PLATELET VOLUME: 8.5 fL (ref 7.0–10.0)
NEUTROPHILS ABSOLUTE COUNT: 4.1 10*9/L (ref 2.0–7.5)
PLATELET COUNT: 195 10*9/L (ref 150–440)
RED CELL DISTRIBUTION WIDTH: 15.9 % — ABNORMAL HIGH (ref 12.0–15.0)
WBC ADJUSTED: 5.6 10*9/L (ref 4.5–11.0)

## 2017-02-16 LAB — SODIUM: Sodium:SCnc:Pt:Ser/Plas:Qn:: 136

## 2017-02-16 MED ORDER — HYDROCODONE 5 MG-ACETAMINOPHEN 325 MG TABLET
ORAL_TABLET | Freq: Four times a day (QID) | ORAL | 0 refills | 0 days | Status: CP
Start: 2017-02-16 — End: 2017-03-30

## 2017-02-16 MED ORDER — LORAZEPAM 0.5 MG TABLET
ORAL_TABLET | 0 refills | 0 days | Status: CP
Start: 2017-02-16 — End: 2017-03-30

## 2017-02-16 NOTE — Unmapped (Signed)
REFERRING PROVIDER:   Rae Lips, Fnp  [address]  [address]  [facility]  [place], [place] [address]    PRIMARY CARE PROVIDER:  Quentin Ore, FNP  [address] [facility]  [place] [address]    DIAGNOSIS: Stage IV adenoCA Lung  MOLECULAR PROFILE: +ALK Re-arrangment  CURRENT TREATMENT: alectinib since Aug 2017  PREVIOUS TREATMENT: crizotinib x68mos    REASON FOR VISIT: Angelica [name] is a 66 y.o. woman seen today for further mgmt of stage IV adenocarcinoma of the lung.    ASSESSMENT/PLAN    # Stage IV adenocarcinoma of the lung; ALK+.  Metastases to bilateral supraclavicular, mediastinal, and R hilar node, T7 and T10, and right pleural fluid. CT scans on 11/03/2015 indicated progression of disease.  Switched therapy to alectinib and recent scans indicate response. Alectinib held 01/12/17-02/01/17 due to new onset dyspnea and GGO of unclear etiology. Now back on alectinib without signs or symptoms of drug-induced ILD.   - Continue alectinib 600 mg BID with meals  - Repeat scan in 3 months    # Dyspnea on exertion with new groundglass opacities on CT chest, 01/2017: Etiology remains uncertain. Held alectinib and restarted furosemide with improvement in symptoms. Bronchoscopy negative for infectious etiologies, otherwise unrevealing of a definitive cause. Pulmonology agrees with Ddx of pulmonary edema vs drug-induced ILD. Repeat CT chest looks stable to slightly improved. Suspect that this was primarily due to hypervolemia causing pulmonary edema, given timing correlating with discontinuation of furosemide, hypervolemic on exam at last visit, and prompt improvement with diuresis. Cannot fully explain GGOs and cannot fully exclude alectinib-induced ILD, but tolerating resumption of alectinib without recurrence of symptoms. Never started steroids.   - Continue furosemide, reducing to 20 mg every other day due to slight rise in Cr  - Monitor volume status closely; patient instructed to call with worsening dyspnea, edema, weight gain  - Echocardiogram to evaluate for underlying cardiomyopathy; depending on these results, may warrant referral to cardiology to optimize cardiac function    # Ulcerated lipodermatosclerosis of LLE;  Likely 2/2 chronic venous insufficiency.  Being followed closely by wound care.    # HTN:  Cont current meds.  PCP will continue to assist w/ management.     # Hx of Rhabdomyolysis likely secondary to daptomycin, acute kidney injury secondary to rhabdo and HTN (resolved).    # Depression. Appreciate CCSP support.  [facility] following. Iimproved with anti-depressant, will continue. She also uses ativan prn (refilled today).    # Chronic Pain: cont hydrocodone/APAP prn (refilled today). Neuropathic in nature and mostly at night. Improved significantly with gabapentin qhs.    RTC in 6 weeks for labs, clinic visit    This patient was seen and discussed with Dr. Nedra Hai.    Wendall Papa, MD, MPH  Hematology-Oncology Fellow        HISTORY OF PRESENT ILLNESS: Angelica [name] presents today as a new patient for further evaluation of stage IV adenocarcinoma of the lung. Please see below in oncology history for further details. In summary, patient presented with nausea and vomiting, and underwent workup including CT scan of the abdomen, which revealed 2.8 cm lung nodule in the right lower lung base. Further staging workup including PET CT scan revealed R lung nodules/masses. Bilateral supraclavicular, mediastinal, and R hilar node mets. Bone mets T7 and T10. Mod sized R pleural effusion, small L pleural effusion. Thoracentesis of right pleural effusion  showed malignant cells. Right subclavian lymph node FNA also showed pathologic findings consistent with adenocarcinoma of lung primary.     Patient was previously treated for suspected cellulitis of the left lower extremity at [facility] from 10/12-10/27/16 and was discharged on IV daptomycin, IV ertapenem, and Pulse Vac therapy. She was readmitted on 02/01/15 due to weakness and rhabdomyolysis likely secondary to daptomycin. On 02/04/15 she was transferred to [facility] for further management. She was initially started on IV Zyvox and continued on the IV Invanz. Dermatology was consulted and the patient's presentation was thought to be more consistent with lipodermatosclerosis. Tissue biopsy was consistent with lipodermatosclerosis secondary to chronic venous stasis.    INTERVAL HX:   [name] was last seen in clinic on 02/02/17. Since that time, she has restarted her alectinib. She continues to breathe comfortably and does not feel like her shortness of breath is worsened at all with resumption of this medication. She denies dyspnea at rest, chest pain, fortunately edema, or abdominal distention. She reports that her previous rash involving her chest and arms has recurred with resumption of therapy. The rash is neither painful nor itchy. It improves with use of triamcinolone cream. She continues to take furosemide 20 mg daily. Her energy is good. She is heading to [place] for Thanksgiving.      Oncology History    11        Primary adenocarcinoma of right lung (CMS-HCC)    01/14/2015 -  Presenting Symptoms     Presented with nausea, vomiting and diarrhea. Underwent abdominal CT scan, which incidentally found 2.8 cm nodule in the right lung base with multiple w/ additional sub cm ground-glass nodules.         01/16/2015 Biopsy     EBUS Bx of RLL - negative for malignancy         02/05/2015 -  Cancer Staged     PET/CT : FDG avid R lung nodules/masses. Bilateral supraclavicular, mediastinal, and R hilar node mets. Bone mets T7 and T10. Mod sized R pleural effusion, small L pleural effusion. Mild FDG avid b/l iliac, inguinal, femoral LNs - likely benign.         02/09/2015 Procedure     R thoracentesis 750cc removed - positive for malignancy.          02/09/2015 Biopsy     R supraclavicular LN FNA - positive for malignancy.            PAST MEDICAL HISTORY  Past Medical History:   Diagnosis Date   ??? Asthma    ??? Cellulitis    ??? Diverticulitis    ??? Hypertension 04/27/2015   ??? Lung cancer, lower lobe (CMS-HCC)     RIGHT   ??? Renal failure     AKI with failure now resolved   ??? Seasonal allergies    ??? Shingles        Allergies   Allergen Reactions   ??? Daptomycin Other (See Comments)     Rhabdomyolysis   ??? Dilaudid [Hydromorphone] Nausea And Vomiting       MEDICATIONS  Current Outpatient Prescriptions on File Prior to Visit   Medication Sig Dispense Refill   ??? albuterol (PROAIR HFA) 90 mcg/actuation inhaler Inhale 2 puffs every six (6) hours as needed for wheezing. 1 Inhaler 3   ??? ALECENSA 150 mg capsule TAKE 4 CAPSULES (600 MG) BY MOUTH TWICE DAILY WITH MEALS 240 capsule 11   ??? cetirizine (ZYRTEC) 10 MG tablet  Take 10 mg by mouth nightly.      ??? foam bandage 2  Bndg Apply 1 application topically daily. 1 each 0   ??? furosemide (LASIX) 20 MG tablet Take 1 tablet (20 mg total) by mouth daily. 60 tablet 6   ??? gabapentin (NEURONTIN) 300 MG capsule Take 3 capsules (900 mg total) by mouth nightly. 90 capsule 5   ??? lisinopril (PRINIVIL,ZESTRIL) 10 MG tablet Take 1 tablet (10 mg total) by mouth daily. 90 tablet 3   ??? minocycline (MINOCIN,DYNACIN) 100 MG capsule Take 1 capsule (100 mg total) by mouth Two (2) times a day. for 10 days 20 capsule 0   ??? pantoprazole (PROTONIX) 20 MG tablet Take 1 tablet (20 mg total) by mouth daily. 30 tablet 1   ??? potassium chloride 20 mEq TbER Take 20 mEq by mouth Two (2) times a day. 60 tablet 0   ??? prochlorperazine (COMPAZINE) 10 MG tablet TAKE 1 TABLET BY MOUTH EVERY 6 HOURS AS NEEDED FOR NAUSEA 60 tablet 3   ??? ranitidine (ZANTAC) 150 MG tablet Take 150 mg by mouth continuous as needed for heartburn.     ??? sertraline (ZOLOFT) 100 MG tablet TAKE 1 AND 1/2 TABLETS BY MOUTH EVERY DAY 45 tablet 6   ??? triamcinolone (KENALOG) 0.1 % cream Apply topically Two (2) times a day. 85 g 2   ??? [DISCONTINUED] HYDROcodone-acetaminophen (NORCO) 5-325 mg per tablet Take 1 tablet by mouth Every six (6) hours. 120 tablet 0   ??? [DISCONTINUED] LORazepam (ATIVAN) 0.5 MG tablet Take 1-2 po QHS or as needed 60 tablet 0   ??? elastic bandage (SUREPRESS HIGH COMPRESSION) 4 X 3.2 -yard [employer] Apply 1 application topically daily. Apply as directed; remove at night (Patient not taking: Reported on 02/16/2017) 1 each 0   ??? VASHE WOUND THERAPY 0.033 % IrSl IRRIGATE WITH 16 OZ AS DIRECTED TWICE DAILY 3000 mL 0     No current facility-administered medications on file prior to visit.        FAMILY HISTORY  Cancer-related family history includes Breast cancer in her mother.    SOCIAL HISTORY  she is a never smoker, no Etoh.    - Married, mother of 2. 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      REVIEW OF SYSTEMS  A 10 point review of systems was obtained and was negative except for those symptoms listed in the HPI.     PHYSICAL EXAM  ECOG Performance Status: 1  Vitals:    02/16/17 1452   BP: 149/70   Pulse: 73   Resp: 18   Temp: 36.7 ??C (98.1 ??F)   SpO2: 95%     GEN: Pleasant, well-appearing woman in NAD, accompanied by son and husband  HEENT: Pupils equally round, sclerae anicteric, conjunctiva clear, moist mucous membranes, no oral lesions or exudates  NECK: Supple, no JVD at 90 degrees  HEME/LYMPH: No palpable cervical or supraclavicular lymphadenopathy  CV: Normal rate, regular rhythm, normal S1 and S2, no murmurs, rubs, or gallops; extremities warm, well-perfused, no right lower extremity edema; LLE wrapped  RESP: Lungs clear to auscultation bilaterally, no wheezes, ronchi or rales, normal work of breathing  GI: Soft, non-tender, non-distended, normoactive bowel sounds, no masses or organomegaly  SKIN AND SUBCUTANEOUS TISSUES: Splotchy, blanching rash involving chest and arms bilaterally  NEURO: Alert and oriented, speech intact, following commands, moving all extremities well, no focal deficits appreciated  PSYCH: Normal mood and appropriate affect  DIAGNOSTIC DATA: I have personally reviewed the patient's records in EPIC including but not limited to clinic notes, admission notes, operative reports, pathology reports, lab results and imaging results.     Lab Results   Component Value Date    WBC 5.6 02/16/2017    HGB 12.0 02/16/2017    HCT 35.5 (L) 02/16/2017    PLT 195 02/16/2017       Lab Results   Component Value Date    NA 136 02/16/2017    K 4.3 02/16/2017    CL 103 02/16/2017    CO2 25.0 02/16/2017    BUN 46 (H) 02/16/2017    CREATININE 1.05 (H) 02/16/2017    GLU 88 02/16/2017    CALCIUM 8.7 02/16/2017    MG 1.7 09/12/2016    PHOS 4.8 (H) 12/07/2016       Lab Results   Component Value Date    BILITOT 0.6 02/16/2017    BILIDIR <0.10 02/04/2015    PROT 7.8 02/16/2017    ALBUMIN 3.7 02/16/2017    ALT 23 02/16/2017    AST 24 02/16/2017    ALKPHOS 188 (H) 02/16/2017       RADIOLOGY RESULTS: I have personally reviewed the patient's CT scan or PET scan on PACS that shows the following results.     CT chest without contrast, 02/01/17:  Stable appearing 1.7 cm right lower lobe nodule, unchanged since 10/11/2016 when remeasured in similar manner and location.     Interval decrease in small right pleural effusion. Trace pericardial effusion.    Stable appearing diffusely scattered groundglass opacities in bilateral lungs; infection/inflammation. Recommend follow-up CT in 6-8 weeks to ensure clearing.    CT chest, abdomen with contrast, 01/09/17:  Impression     --Stable right lower lobe nodule measuring 1.7 cm; unchanged since 10/11/2016 when remeasured in similar manner and location    --New small right pleural effusion. Small pericardial effusion.    --Diffusely scattered groundglass opacities in bilateral lungs; nonspecific, representing infection/inflammation. Recommend follow-up chest CT in 6-8 weeks to ensure clearing.       MRI Brain w wo contrast 12/13/2016  Impression     No evidence of metastatic disease.  ??         CT C/A/P 10/10/2016  Impression Mild interval decrease in the size of 1.1 cm right lower lobe nodule, previously measured 1.5 cm on 07/11/2016.    Multiple bibasilar micronodules, likely mucoid impacted airways.        10/10/16  FINDINGS:   CHEST:  AIRWAYS:  Clear central airways.    Bibasilar paravertebral linear opacities, likely scarring.    Interval decrease in the size of right lower lobe nodule measuring 1.1 cm (series 3, image 63), previously measured 1.5 cm when remeasured in a similar manner and location. Few bibasilar micronodules, likely mucoid impacted airways.    No pleural effusion.    MEDIASTINUM:    Normal heart size. No pericardial effusion.    Normal caliber thoracic aorta.    No lymphadenopathy.    ABDOMEN/PELVIS:  HEPATOBILIARY: New low-attenuation lesions in the right hepatic lobe measuring approximately 1.7 cm (1:28) and 1.1 cm (1:20). Diffuse hepatic steatosis.  The spleen, pancreas, right lateral adrenal glands are unremarkable.  KIDNEYS/URETERS: Subcentimeter low-attenuation lesion in left kidney, too small to characterize by CT but likely simple cyst.  BLADDER: Unremarkable.  BOWEL/PERITONEUM/RETROPERITONEUM: No bowel obstruction. No acute inflammatory process. No ascites. Diverticulosis without diverticulitis.Small fat-containing umbilical hernia.  VASCULATURE: Abdominal aorta within normal limits  for patient's age. Unremarkable inferior vena cava.  LYMPH NODES: No adenopathy.  REPRODUCTIVE ORGANS: Unremarkable.    BONES/SOFT TISSUES: Right Multilevel degenerative changes of the spine.     ??      Impression       Mild interval decrease in the size of 1.1 cm right lower lobe nodule, previously measured 1.5 cm on 07/11/2016.    Multiple bibasilar micronodules, likely mucoid impacted airways.          CT chest/abdomen 07/11/2016  Impression     Right lower lobe pulmonary nodule, grossly unchanged.       CT chest abdomen and pelvis 04/11/2016  Impression     Grossly unchanged spiculated nodule in the right lower lobe. No new lung lesions identified.    Subcentimeter sclerotic lesion in the sternum, not seen on the chest CT dated 01/15/2015, indeterminate. Attention on follow-up.         Brain MRI 11/03/15:     Impression     No evidence of metastatic disease to the brain.    New focus of signal abnormality within the right precentral gyrus without enhancement which may represent an interval, age-indeterminate infarct.       CT 01/18/16:  Impression     -Decreasing fissural nodularity about the right major minor fissures, decreasing left subpectoral/axillary lymphadenopathy, as well as, decrease in now trace right pleural effusion and pericardial effusion.   -Stable right lower lobe lesion. No new sites of disease identified.       PATHOLOGY:  02/09/2015  Final Diagnosis     A: Lymph node, right supraclavicular, fine needle aspiration  - Malignant cells present  - Consistent with metastatic lung adenocarcinoma (see comment)   Immunohistochemical stains were performed and tumor cells are diffusely positive for TTF-1 and negative for p40, supporting the diagnosis of metastatic lung adenocarcinoma. Molecular testing will be performed and results will be reported separately.    02/09/2015  A. Pleural fluid, right, thoracentesis  - Malignant cells present  - Consistent with patient's known metastatic lung adenocarcinoma.    MOLECULAR TESTING:  +ALK re-arrangement on in house cytogenetics.

## 2017-02-16 NOTE — Unmapped (Signed)
It was great seeing you today. Please continue taking your alectinib.    Please decrease your furosemide to 20 mg every other day. If you start noticing worsening swelling or shortness of breath, please call us. Your creatinine was slightly up today.

## 2017-02-16 NOTE — Unmapped (Signed)
Lab drawn and sent for analysis.

## 2017-02-21 ENCOUNTER — Ambulatory Visit
Admission: RE | Admit: 2017-02-21 | Discharge: 2017-02-21 | Disposition: A | Discharge status: Home / Self Care | Attending: Vascular Surgery | Admitting: Vascular Surgery

## 2017-02-21 DIAGNOSIS — L97921 Non-pressure chronic ulcer of unspecified part of left lower leg limited to breakdown of skin: Principal | ICD-10-CM

## 2017-02-21 NOTE — Unmapped (Addendum)
Wound Care Instructions:    First, cleanse hands with antibacterial soap and water or hand sanitizer solution. Then, begin wound care.     Perform daily wound cleansing and dressing changes. Cleanse wound with Vashe or Anasept cleanser.    Continue with you current dressing of Manukatek, drawtex, abd, kerlix, and Surepress every other day  Other days apply Iodofor foam, abd, kerlix, and surepress.    Dressing change daily.    We will order wound care supplies from [facility] [employer]. You should receive your first shipment to your home within 48 hours. If you have any issues with your supply order, please attempt to contact [employer] first at [phone]. Supplies ordered: manukatex, drawtex, abd, kerlix, surepress, iodofor foam, durapore tape, gloves          If you have any questions or concerns regarding your wound or wound care, please contact us at the [facility] clinic at [phone].    If your wound starts to develop the following , please call the [facility] for further advise:    ??  Increased drainage  ??  Redness around the wound  ??  Strong odor from the wound when changing the bandages  ??  Increased pain    Please do not hesitate to leave a voicemail on the nurse line. We make every effort to return your call the same day or the next day. Please leave a clear message with your name, date of birth,  and your medical record number. Leave a brief description of your problem.    If you are experiencing the following, please call us for advise or consider going to the nearest local Emergency Department or call 911.    ??  Fever of 100 F  ??  Nausea or Vomitting  ??  Pus draining from your wound  ??  Redness of the whole foot or leg  ??  Severe increase in pain above your baseline.    [facility]  [phone]

## 2017-02-21 NOTE — Unmapped (Signed)
BP 145/60  - Pulse 84  - Temp 36.8 ??C (98.2 ??F) (Temporal)  - Ht 165.1 cm (5' 5)  - Wt 99.8 kg (220 lb 0.3 oz)  - LMP  (LMP Unknown)  - BMI 36.61 kg/m??     Wound Back Left;Upper pink unapproximated no drainage (Active)       Wound 09/15/16 Leg Left (Active)   Wound Status Not Healed 02/21/2017  2:06 PM   Pain 2 02/21/2017  2:06 PM   Dressing Status      Removed 02/21/2017  2:06 PM   Wound Length (cm)      8.4 02/21/2017  2:06 PM   Wound Width (cm)      29.1 02/21/2017  2:06 PM   Wound Depth (cm)      0.1 02/21/2017  2:06 PM   Area (sq cm) 244.44 02/21/2017  2:06 PM   Volume (mL) 24.44 02/21/2017  2:06 PM   Wound Length % Healed (Calculated) 2.33 % 02/21/2017  2:06 PM   Wound Width % Healed (Calculated) -19.26 % 02/21/2017  2:06 PM   Wound Depth % Healed   50 % 02/21/2017  2:06 PM   Area (sq cm) % Healed (Calculated) -16.49 % 02/21/2017  2:06 PM   Volume (mL) % Healed   -16.49 % 02/21/2017  2:06 PM   Odor None 02/21/2017  2:06 PM   Margins Undefined edges 02/21/2017  2:06 PM   Peri-wound Assessment      Clean;Pink;Scar tissue 02/21/2017  2:06 PM   Encounter Subsequent 02/21/2017  2:06 PM   Slough % 1-25% 02/21/2017  2:06 PM   Epithelialization % 1-25% 02/21/2017  2:06 PM   Granulation % 76-100% 02/21/2017  2:06 PM   Exudate Type      Sero-sanguineous 02/21/2017  2:06 PM   Exudate Amnt      Large 02/21/2017  2:06 PM   Thickness Full Thickness 02/21/2017  2:06 PM   Paring No 02/21/2017  2:06 PM   Texture Edema 02/21/2017  2:06 PM   Moisture Moist 02/21/2017  2:06 PM   Hypergranuation No 02/21/2017  2:06 PM   Tunneling      No 02/21/2017  2:06 PM   Undermining     No 02/21/2017  2:06 PM   Sinus Tract No 02/21/2017  2:06 PM   Treatments Cleansed/Irrigation;Pharmaceutical agent 02/21/2017  2:06 PM   Picture Taken Yes 02/21/2017  2:06 PM   Dressing Other (Comment) 02/21/2017  2:21 PM     This is a patient we have been following for a wound in the distal portion of the left lower extremity. She had a split-thickness skin graft in some areas of these have taken. She did have 1 episode of slough of a small portion of the graft when she developed edema of the left lower extremity. This followed a holding of her Lasix.    Her edema is noted good control    On physical exam the patient's awake alert in no acute distress. She is able to communicate without difficulty    Vital signs were reviewed and the patient's afebrile    There is good healthy granulation tissue present in the wound bed. There are areas of increased epithelialization as well    Impression wounds as above    She may benefit from a new method of split-thickness skin graft and we will plan on using the device on her when it is available.

## 2017-02-27 MED ORDER — GABAPENTIN 300 MG CAPSULE
ORAL_CAPSULE | 0 refills | 0 days | Status: CP
Start: 2017-02-27 — End: 2017-03-30

## 2017-03-06 MED ORDER — PANTOPRAZOLE 20 MG TABLET,DELAYED RELEASE
ORAL_TABLET | 0 refills | 0 days | Status: CP
Start: 2017-03-06 — End: 2017-04-17

## 2017-03-06 NOTE — Unmapped (Signed)
[facility] Refill Coordination Note  Specialty Medication(s): Alecensa 150mg       SUNSHYNE HORVATH, DOB: 1951/03/20  Phone: [phone] (home) , Alternate phone contact: N/A  Phone or address changes today?: No  All above HIPAA information was verified with patient.  Shipping Address: [address]  [place] [address]   Insurance changes? No    Completed refill call assessment today to schedule patient's medication shipment from the [facility] [phone]).      Confirmed the medication and dosage are correct and have not changed: Yes, regimen is correct and unchanged.    Confirmed patient started or stopped the following medications in the past month:  No, there are no changes reported at this time.    Are you tolerating your medication?:  Avanthika reports tolerating the medication.    ADHERENCE    Is this medicine transplant or covered by Medicare Part B? No.        Did you miss any doses in the past 4 weeks? No missed doses reported.    FINANCIAL/SHIPPING    Delivery Scheduled: Yes, Expected medication delivery date: 03/17/17     Shontay did not have any additional questions at this time.    Delivery address validated in [facility] scheduling system: Yes, address listed in [facility] is correct.    We will follow up with patient monthly for standard refill processing and delivery.      Thank you,  [employer]   [facility] Specialty Pharmacist

## 2017-03-07 MED FILL — ALECENSA/150MG/CAPS: ALECENSA/150MG/CAPS | 30 days supply | Qty: 240 | Fill #5

## 2017-03-21 ENCOUNTER — Ambulatory Visit
Admission: RE | Admit: 2017-03-21 | Discharge: 2017-03-21 | Disposition: A | Discharge status: Home / Self Care | Attending: Vascular Surgery | Admitting: Vascular Surgery

## 2017-03-21 DIAGNOSIS — T148XXA Other injury of unspecified body region, initial encounter: Principal | ICD-10-CM

## 2017-03-21 NOTE — Unmapped (Signed)
BP 143/71  - Pulse 86  - Temp 36.8 ??C (98.3 ??F) (Temporal)  - LMP  (LMP Unknown)     Wound Back Left;Upper pink unapproximated no drainage (Active)       Wound 09/15/16 Leg Left (Active)   Wound Status Not Healed 03/21/2017  1:32 PM   Pain 0 03/21/2017  1:32 PM   Dressing Status      Removed 03/21/2017  1:32 PM   Wound Length (cm)      8.5 03/21/2017  1:32 PM   Wound Width (cm)      26 03/21/2017  1:32 PM   Wound Depth (cm)      0.2 03/21/2017  1:32 PM   Area (sq cm) (Calculated) 221 03/21/2017  1:32 PM   Volume (mL) (Calculated) 44.2 03/21/2017  1:32 PM   Wound Length % Healed (Calculated) 1.16 % 03/21/2017  1:32 PM   Wound Width % Healed (Calculated) -6.56 % 03/21/2017  1:32 PM   Wound Depth % Healed (Calculated) 0 % 03/21/2017  1:32 PM   Area (sq cm) % Healed (Calculated) -5.32 % 03/21/2017  1:32 PM   Volume (mL) % Healed (Calculated) -5.32 % 03/21/2017  1:32 PM   Odor None 03/21/2017  1:32 PM   Margins Undefined edges 03/21/2017  1:32 PM   Peri-wound Assessment      Edema 03/21/2017  1:32 PM   Encounter Subsequent 03/21/2017  1:32 PM   Slough % 26-50% 03/21/2017  1:32 PM   Eschar % None 03/21/2017  1:32 PM   Epithelialization % None 03/21/2017  1:32 PM   Granulation % 26-50% 03/21/2017  1:32 PM   Exudate Type      Sero-sanguineous 03/21/2017  1:32 PM   Exudate Amnt      Large 03/21/2017  1:32 PM   Thickness Full Thickness 03/21/2017  1:32 PM   Exposed Structure N/A 03/21/2017  1:32 PM   Paring No 02/21/2017  2:06 PM   Texture Edema 03/21/2017  1:32 PM   Moisture Moist 03/21/2017  1:32 PM   Temperature WNL 03/21/2017  1:32 PM   Hypergranuation No 03/21/2017  1:32 PM   Tunneling      No 03/21/2017  1:32 PM   Undermining     No 03/21/2017  1:32 PM   Sinus Tract No 03/21/2017  1:32 PM   Treatments Cleansed/Irrigation;Pharmaceutical agent 03/21/2017  1:32 PM   Picture Taken Yes 03/21/2017  1:32 PM   Dressing Other (Comment) 02/21/2017  2:21 PM     This is a patient with a wound on the distal portion of the left lower extremity.  Initially it was almost circumferential.  She was treated with multiple modalities including a split-thickness skin graft as well as laser therapy.    The skin graft had large areas where it took.    On physical exam the patient is awake alert in no acute distress.  She is able to communicate without difficulty    Vital signs are reviewed and the patient is afebrile    The wounds continue to show improvement.  There is no infection the granulation tissue is healthy dimensions are smaller and there is increased epithelialization    Impression healing wound distal portion of left calf    Plan we will see her again in 4 weeks.  I believe she would be a good candidate for the new split thickness skin graft application device.

## 2017-03-21 NOTE — Unmapped (Signed)
Wound Care Instructions:    First, cleanse hands with antibacterial soap and water or hand sanitizer solution. Then, begin wound care.     Perform daily wound cleansing and dressing changes. Cleanse wound with Vashe or Anasept cleanser.    Continue with you current dressing of [place], drawtex, abd, kerlix, and Surepress every other day  Other days apply Iodofor foam, abd, kerlix, and surepress.    Dressing change daily.    If your wound starts to develop the following , please call the [facility] for further advise:    ??  Increased drainage  ??  Redness around the wound  ??  Strong odor from the wound when changing the bandages  ??  Increased pain    Please do not hesitate to leave a voicemail on the nurse line. We make every effort to return your call the same day or the next day. Please leave a clear message with your name, date of birth,  and your medical record number. Leave a brief description of your problem.    If you are experiencing the following, please call us for advise or consider going to the nearest local Emergency Department or call 911.    ??  Fever of 100 F  ??  Nausea or Vomitting  ??  Pus draining from your wound  ??  Redness of the whole foot or leg  ??  Severe increase in pain above your baseline.    [facility]  [phone]          If you have any questions or concerns regarding your wound or wound care, please contact us at the [facility] at [phone].    If your wound starts to develop the following , please call the [facility] for further advise:    ??  Increased drainage  ??  Redness around the wound  ??  Strong odor from the wound when changing the bandages  ??  Increased pain    Please do not hesitate to leave a voicemail on the nurse line. We make every effort to return your call the same day or the next day. Please leave a clear message with your name, date of birth,  and your medical record number. Leave a brief description of your problem.    If you are experiencing the following, please call us for advise or consider going to the nearest local Emergency Department or call 911.    ??  Fever of 100 F  ??  Nausea or Vomitting  ??  Pus draining from your wound  ??  Redness of the whole foot or leg  ??  Severe increase in pain above your baseline.    [facility]  [phone]

## 2017-03-27 NOTE — Unmapped (Signed)
Please refill if appropriate

## 2017-03-30 ENCOUNTER — Ambulatory Visit: Admission: RE | Admit: 2017-03-30 | Discharge: 2017-03-30 | Disposition: A | Discharge status: Home / Self Care

## 2017-03-30 ENCOUNTER — Ambulatory Visit
Admission: RE | Admit: 2017-03-30 | Discharge: 2017-03-30 | Disposition: A | Discharge status: Home / Self Care | Admitting: Hematology & Oncology

## 2017-03-30 DIAGNOSIS — C3490 Malignant neoplasm of unspecified part of unspecified bronchus or lung: Principal | ICD-10-CM

## 2017-03-30 DIAGNOSIS — E8770 Fluid overload, unspecified: Secondary | ICD-10-CM

## 2017-03-30 DIAGNOSIS — C3491 Malignant neoplasm of unspecified part of right bronchus or lung: Secondary | ICD-10-CM

## 2017-03-30 LAB — COMPREHENSIVE METABOLIC PANEL
ALBUMIN: 4.4 g/dL (ref 3.5–5.0)
ALT (SGPT): 17 U/L (ref 15–48)
ANION GAP: 11 mmol/L (ref 9–15)
AST (SGOT): 26 U/L (ref 14–38)
BILIRUBIN TOTAL: 0.5 mg/dL (ref 0.0–1.2)
BLOOD UREA NITROGEN: 27 mg/dL — ABNORMAL HIGH (ref 7–21)
BUN / CREAT RATIO: 26
CALCIUM: 9.6 mg/dL (ref 8.5–10.2)
CHLORIDE: 112 mmol/L — ABNORMAL HIGH (ref 98–107)
CO2: 17 mmol/L — ABNORMAL LOW (ref 22.0–30.0)
CREATININE: 1.02 mg/dL — ABNORMAL HIGH (ref 0.60–1.00)
EGFR MDRD AF AMER: >=60 mL/min/{1.73_m2} (ref >=60)
EGFR MDRD NON AF AMER: 55 mL/min/{1.73_m2} — ABNORMAL LOW (ref >=60)
GLUCOSE RANDOM: 97 mg/dL (ref 65–179)
POTASSIUM: 4.2 mmol/L (ref 3.5–5.0)
PROTEIN TOTAL: 8.1 g/dL (ref 6.5–8.3)
SODIUM: 140 mmol/L (ref 135–145)

## 2017-03-30 LAB — CBC W/ AUTO DIFF
BASOPHILS ABSOLUTE COUNT: 0 10*9/L (ref 0.0–0.1)
EOSINOPHILS ABSOLUTE COUNT: 0.1 10*9/L (ref 0.0–0.4)
HEMATOCRIT: 34.6 % — ABNORMAL LOW (ref 36.0–46.0)
HEMOGLOBIN: 11.5 g/dL — ABNORMAL LOW (ref 12.0–16.0)
LARGE UNSTAINED CELLS: 2 % (ref 0–4)
LYMPHOCYTES ABSOLUTE COUNT: 1 10*9/L — ABNORMAL LOW (ref 1.5–5.0)
MEAN CORPUSCULAR HEMOGLOBIN CONC: 33.2 g/dL (ref 31.0–37.0)
MEAN CORPUSCULAR HEMOGLOBIN: 32.5 pg (ref 26.0–34.0)
MEAN CORPUSCULAR VOLUME: 97.9 fL (ref 80.0–100.0)
MONOCYTES ABSOLUTE COUNT: 0.2 10*9/L (ref 0.2–0.8)
PLATELET COUNT: 234 10*9/L (ref 150–440)
RED BLOOD CELL COUNT: 3.53 10*12/L — ABNORMAL LOW (ref 4.00–5.20)
RED CELL DISTRIBUTION WIDTH: 20.3 % — ABNORMAL HIGH (ref 12.0–15.0)
WBC ADJUSTED: 5.3 10*9/L (ref 4.5–11.0)

## 2017-03-30 LAB — SMEAR REVIEW

## 2017-03-30 LAB — AST (SGOT): Aspartate aminotransferase:CCnc:Pt:Ser/Plas:Qn:: 26

## 2017-03-30 LAB — MONOCYTES ABSOLUTE COUNT: Lab: 0.2

## 2017-03-30 MED ORDER — GABAPENTIN 300 MG CAPSULE
ORAL_CAPSULE | Freq: Every evening | ORAL | 0 refills | 0.00000 days | Status: CP
Start: 2017-03-30 — End: 2017-04-27

## 2017-03-30 MED ORDER — LORAZEPAM 0.5 MG TABLET
ORAL_TABLET | 0 refills | 0 days | Status: CP
Start: 2017-03-30 — End: 2017-05-11

## 2017-03-30 MED ORDER — HYDROCODONE 5 MG-ACETAMINOPHEN 325 MG TABLET
ORAL_TABLET | Freq: Four times a day (QID) | ORAL | 0 refills | 0 days | Status: CP
Start: 2017-03-30 — End: 2017-05-11

## 2017-03-30 NOTE — Unmapped (Signed)
See me back in about 6 weeks w/CT scans.  I will make a cardiology referral in the short run.   In the meantime, continue Lasix and potassium.     For appointments & questions Monday through Friday 8 AM???5 PM     Please call [phone] or Toll free [phone]    For urgent clinical needs on Nights, Weekends or Holidays  Call [phone] and ask for the oncologist on call.     For appointment changes please contact during normal business hours.     Please visit [email or web address], a resource created just for family members and caregivers.  This website lists support services, how and where to ask for help. It has tools to assist you as you help [place] care for your loved one.    [facility]  [address]  [place], [place] [address]  [email or web address]

## 2017-03-30 NOTE — Unmapped (Signed)
..  Patient labs drawn and sent for analysis. Care per Liz Beach RN

## 2017-03-30 NOTE — Unmapped (Signed)
REFERRING PROVIDER:   Rae Lips, Fnp  [address]  [address]  [facility]  [place], [place] [address]    PRIMARY CARE PROVIDER:  Quentin Ore, FNP  [address] [facility]  [place] [address]    DIAGNOSIS: Stage IV adenoCA Lung  MOLECULAR PROFILE: +ALK Re-arrangment  CURRENT TREATMENT: alectinib since Aug 2017  PREVIOUS TREATMENT: crizotinib x76mos    REASON FOR VISIT: Ms. JENAY MORICI is a 66 y.o. woman seen today for further mgmt of stage IV adenocarcinoma of the lung.    ASSESSMENT/PLAN    # Stage IV adenocarcinoma of the lung; ALK+.  Metastases to bilateral supraclavicular, mediastinal, and R hilar node, T7 and T10, and right pleural fluid. CT scans on 11/03/2015 indicated progression of disease.  Switched therapy to alectinib and recent scans indicate response. Alectinib held 01/12/17-02/01/17 due to new onset dyspnea and GGO of unclear etiology; now felt to be c/w volume overload. Now back on alectinib without signs or symptoms of drug-induced ILD.   - Continue alectinib 600 mg BID with meals  - Repeat scan in 6 weeks.    # Dyspnea on exertion with new groundglass opacities on CT chest, 01/2017:. Held alectinib and restarted furosemide with improvement in symptoms. Bronchoscopy negative for infectious etiologies, otherwise unrevealing of a definitive cause. Pulmonology agrees with Ddx of pulmonary edema vs drug-induced ILD. Repeat CT chest looks stable to slightly improved. Suspect that this was primarily due to hypervolemia causing pulmonary edema, given timing correlating with discontinuation of furosemide, hypervolemic on exam at last visit, and prompt improvement with diuresis. Cannot fully explain GGOs and cannot fully exclude alectinib-induced ILD, but tolerating resumption of alectinib without recurrence of symptoms. Never started steroids.   - Continue furosemide, reducing to 20 mg every other day due to slight rise in Cr  - Monitor volume status closely; patient instructed to call with worsening dyspnea, edema, weight gain  - Echocardiogram to evaluate for underlying cardiomyopathy was essentially normal, but cardiology referral placed as she has a very careful fluid balance and may have HFpEF.  I explained that I'm by no means certain of this diagnosis and hopefully cardiology can weigh in.    # Ulcerated lipodermatosclerosis of LLE;  Likely 2/2 chronic venous insufficiency.  Being followed closely by wound care with another graft planned.    # HTN:  Cont current meds.  PCP will continue to assist w/ management.     # Hx of Rhabdomyolysis likely secondary to daptomycin, acute kidney injury secondary to rhabdo and HTN (resolved).    # Depression. Appreciate CCSP support.  [facility] following. Improved with anti-depressant, will continue. She also uses ativan prn (refilled today).    # Chronic Pain: cont hydrocodone/APAP prn (refilled today). Neuropathic in nature and mostly at night. Improved significantly with gabapentin qhs        HISTORY OF PRESENT ILLNESS: Ms. LABRISHA WUELLNER presents today as a new patient for further evaluation of stage IV adenocarcinoma of the lung. Please see below in oncology history for further details. In summary, patient presented with nausea and vomiting, and underwent workup including CT scan of the abdomen, which revealed 2.8 cm lung nodule in the right lower lung base. Further staging workup including PET CT scan revealed R lung nodules/masses. Bilateral supraclavicular, mediastinal, and R hilar node mets. Bone mets T7 and T10. Mod sized R pleural effusion, small L pleural effusion. Thoracentesis of right pleural effusion showed  malignant cells. Right subclavian lymph node FNA also showed pathologic findings consistent with adenocarcinoma of lung primary.     Patient was previously treated for suspected cellulitis of the left lower extremity at [facility] from 10/12-10/27/16 and was discharged on IV daptomycin, IV ertapenem, and Pulse Vac therapy. She was readmitted on 02/01/15 due to weakness and rhabdomyolysis likely secondary to daptomycin. On 02/04/15 she was transferred to [facility] for further management. She was initially started on IV Zyvox and continued on the IV Invanz. Dermatology was consulted and the patient's presentation was thought to be more consistent with lipodermatosclerosis. Tissue biopsy was consistent with lipodermatosclerosis secondary to chronic venous stasis.    INTERVAL HX:   Ms. Ridgely is tolerating alectinib well. She continues to breathe comfortably and does not feel like her shortness of breath is worsened at all with resumption of this medication. She is taking Lasix 20mg  QOD.  She denies dyspnea at rest, chest pain, LE edema, or abdominal distention. No recurrent rash. Her energy is fair. No new CNS symptoms.       Oncology History    11        Primary adenocarcinoma of right lung (CMS-HCC)    01/14/2015 -  Presenting Symptoms     Presented with nausea, vomiting and diarrhea. Underwent abdominal CT scan, which incidentally found 2.8 cm nodule in the right lung base with multiple w/ additional sub cm ground-glass nodules.         01/16/2015 Biopsy     EBUS Bx of RLL - negative for malignancy         02/05/2015 -  Cancer Staged     PET/CT : FDG avid R lung nodules/masses. Bilateral supraclavicular, mediastinal, and R hilar node mets. Bone mets T7 and T10. Mod sized R pleural effusion, small L pleural effusion. Mild FDG avid b/l iliac, inguinal, femoral LNs - likely benign.         02/09/2015 Procedure     R thoracentesis 750cc removed - positive for malignancy.          02/09/2015 Biopsy     R supraclavicular LN FNA - positive for malignancy.            PAST MEDICAL HISTORY  Past Medical History:   Diagnosis Date   ??? Asthma    ??? Cellulitis    ??? Diverticulitis    ??? Hypertension 04/27/2015   ??? Lung cancer, lower lobe (CMS-HCC)     RIGHT   ??? Renal failure     AKI with failure now resolved   ??? Seasonal allergies    ??? Shingles        Allergies   Allergen Reactions   ??? Daptomycin Other (See Comments)     Rhabdomyolysis   ??? Dilaudid [Hydromorphone] Nausea And Vomiting       MEDICATIONS  Current Outpatient Prescriptions on File Prior to Visit   Medication Sig Dispense Refill   ??? ALECENSA 150 mg capsule TAKE 4 CAPSULES (600 MG) BY MOUTH TWICE DAILY WITH MEALS 240 capsule 11   ??? cetirizine (ZYRTEC) 10 MG tablet Take 10 mg by mouth nightly.      ??? elastic bandage (SUREPRESS HIGH COMPRESSION) 4 X 3.2 -yard [employer] Apply 1 application topically daily. Apply as directed; remove at night 1 each 0   ??? foam bandage 2  Bndg Apply 1 application topically daily. 1 each 0   ??? furosemide (LASIX) 20 MG tablet Take 1 tablet (20 mg total) by  mouth daily. (Patient taking differently: Take 20 mg by mouth every other day. ) 60 tablet 6   ??? gabapentin (NEURONTIN) 300 MG capsule TAKE 3 CAPSULES(900 MG) BY MOUTH EVERY NIGHT 90 capsule 0   ??? HYDROcodone-acetaminophen (NORCO) 5-325 mg per tablet Take 1 tablet by mouth Every six (6) hours. 120 tablet 0   ??? LORazepam (ATIVAN) 0.5 MG tablet Take 1-2 po QHS or as needed 60 tablet 0   ??? pantoprazole (PROTONIX) 20 MG tablet TAKE 1 TABLET BY MOUTH DAILY 30 tablet 0   ??? potassium chloride 20 mEq TbER Take 20 mEq by mouth Two (2) times a day. 60 tablet 0   ??? prochlorperazine (COMPAZINE) 10 MG tablet TAKE 1 TABLET BY MOUTH EVERY 6 HOURS AS NEEDED FOR NAUSEA 60 tablet 3   ??? ranitidine (ZANTAC) 150 MG tablet Take 150 mg by mouth continuous as needed for heartburn.     ??? sertraline (ZOLOFT) 100 MG tablet TAKE 1 AND 1/2 TABLETS BY MOUTH EVERY DAY 45 tablet 6   ??? triamcinolone (KENALOG) 0.1 % cream Apply topically Two (2) times a day. 85 g 2   ??? VASHE WOUND THERAPY 0.033 % IrSl IRRIGATE WITH 16 OZ AS DIRECTED TWICE DAILY 3000 mL 0   ??? albuterol (PROAIR HFA) 90 mcg/actuation inhaler Inhale 2 puffs every six (6) hours as needed for wheezing. 1 Inhaler 3   ??? lisinopril (PRINIVIL,ZESTRIL) 10 MG tablet Take 1 tablet (10 mg total) by mouth daily. 90 tablet 3     No current facility-administered medications on file prior to visit.        FAMILY HISTORY  Cancer-related family history includes Breast cancer in her mother.    SOCIAL HISTORY  she is a never smoker, no Etoh.    - Married, mother of 2. 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      REVIEW OF SYSTEMS  A 10 point review of systems was obtained and was negative except for those symptoms listed in the HPI.     PHYSICAL EXAM  ECOG Performance Status: 1  Vitals:    03/30/17 0912   BP: 156/70   Pulse: 82   Resp: 16   SpO2: 96%     GEN: Pleasant, well-appearing woman in NAD, accompanied by son and husband  HEENT: Pupils equally round, sclerae anicteric, conjunctiva clear, moist mucous membranes, no oral lesions or exudates  NECK: Supple, no JVD at 90 degrees  HEME/LYMPH: No palpable cervical or supraclavicular lymphadenopathy  CV: Normal rate, regular rhythm, normal S1 and S2, no murmurs, rubs, or gallops; extremities warm, well-perfused, no right lower extremity edema; LLE wrapped  RESP: Lungs clear to auscultation bilaterally, no wheezes, ronchi or rales, normal work of breathing  GI: Soft, non-tender, non-distended, normoactive bowel sounds, no masses or organomegaly  SKIN AND SUBCUTANEOUS TISSUES: No rash.  NEURO: Alert and oriented, speech intact, following commands, moving all extremities well, no focal deficits appreciated  PSYCH: Normal mood and appropriate affect      DIAGNOSTIC DATA: I have personally reviewed the patient's records in EPIC including but not limited to clinic notes, admission notes, operative reports, pathology reports, lab results and imaging results.     Lab Results   Component Value Date    WBC 5.6 02/16/2017    HGB 12.0 02/16/2017    HCT 35.5 (L) 02/16/2017    PLT 195 02/16/2017       Lab Results   Component Value Date  NA 136 02/16/2017    K 4.3 02/16/2017    CL 103 02/16/2017    CO2 25.0 02/16/2017    BUN 46 (H) 02/16/2017    CREATININE 1.05 (H) 02/16/2017    GLU 88 02/16/2017    CALCIUM 8.7 02/16/2017    MG 1.7 09/12/2016    PHOS 4.8 (H) 12/07/2016       Lab Results   Component Value Date    BILITOT 0.6 02/16/2017    BILIDIR <0.10 02/04/2015    PROT 7.8 02/16/2017    ALBUMIN 3.7 02/16/2017    ALT 23 02/16/2017    AST 24 02/16/2017    ALKPHOS 188 (H) 02/16/2017       RADIOLOGY RESULTS: I have personally reviewed the patient's CT scan or PET scan on PACS that shows the following results.     CT chest without contrast, 02/01/17:  Stable appearing 1.7 cm right lower lobe nodule, unchanged since 10/11/2016 when remeasured in similar manner and location.     Interval decrease in small right pleural effusion. Trace pericardial effusion.    Stable appearing diffusely scattered groundglass opacities in bilateral lungs; infection/inflammation. Recommend follow-up CT in 6-8 weeks to ensure clearing.    CT chest, abdomen with contrast, 01/09/17:  Impression     --Stable right lower lobe nodule measuring 1.7 cm; unchanged since 10/11/2016 when remeasured in similar manner and location    --New small right pleural effusion. Small pericardial effusion.    --Diffusely scattered groundglass opacities in bilateral lungs; nonspecific, representing infection/inflammation. Recommend follow-up chest CT in 6-8 weeks to ensure clearing.       MRI Brain w wo contrast 12/13/2016  Impression     No evidence of metastatic disease.  ??         CT C/A/P 10/10/2016  Impression Mild interval decrease in the size of 1.1 cm right lower lobe nodule, previously measured 1.5 cm on 07/11/2016.    Multiple bibasilar micronodules, likely mucoid impacted airways.        10/10/16  FINDINGS:   CHEST:  AIRWAYS:  Clear central airways.    Bibasilar paravertebral linear opacities, likely scarring.    Interval decrease in the size of right lower lobe nodule measuring 1.1 cm (series 3, image 63), previously measured 1.5 cm when remeasured in a similar manner and location. Few bibasilar micronodules, likely mucoid impacted airways.    No pleural effusion.    MEDIASTINUM:    Normal heart size. No pericardial effusion.    Normal caliber thoracic aorta.    No lymphadenopathy.    ABDOMEN/PELVIS:  HEPATOBILIARY: New low-attenuation lesions in the right hepatic lobe measuring approximately 1.7 cm (1:28) and 1.1 cm (1:20). Diffuse hepatic steatosis.  The spleen, pancreas, right lateral adrenal glands are unremarkable.  KIDNEYS/URETERS: Subcentimeter low-attenuation lesion in left kidney, too small to characterize by CT but likely simple cyst.  BLADDER: Unremarkable.  BOWEL/PERITONEUM/RETROPERITONEUM: No bowel obstruction. No acute inflammatory process. No ascites. Diverticulosis without diverticulitis.Small fat-containing umbilical hernia.  VASCULATURE: Abdominal aorta within normal limits for patient's age. Unremarkable inferior vena cava.  LYMPH NODES: No adenopathy.  REPRODUCTIVE ORGANS: Unremarkable.    BONES/SOFT TISSUES: Right Multilevel degenerative changes of the spine.     ??      Impression       Mild interval decrease in the size of 1.1 cm right lower lobe nodule, previously measured 1.5 cm on 07/11/2016.    Multiple bibasilar micronodules, likely mucoid impacted airways.  CT chest/abdomen 07/11/2016  Impression     Right lower lobe pulmonary nodule, grossly unchanged.       CT chest abdomen and pelvis 04/11/2016  Impression     Grossly unchanged spiculated nodule in the right lower lobe. No new lung lesions identified.    Subcentimeter sclerotic lesion in the sternum, not seen on the chest CT dated 01/15/2015, indeterminate. Attention on follow-up.         Brain MRI 11/03/15:     Impression     No evidence of metastatic disease to the brain.    New focus of signal abnormality within the right precentral gyrus without enhancement which may represent an interval, age-indeterminate infarct.       CT 01/18/16:  Impression     -Decreasing fissural nodularity about the right major minor fissures, decreasing left subpectoral/axillary lymphadenopathy, as well as, decrease in now trace right pleural effusion and pericardial effusion.   -Stable right lower lobe lesion. No new sites of disease identified.       PATHOLOGY:  02/09/2015  Final Diagnosis     A: Lymph node, right supraclavicular, fine needle aspiration  - Malignant cells present  - Consistent with metastatic lung adenocarcinoma (see comment)   Immunohistochemical stains were performed and tumor cells are diffusely positive for TTF-1 and negative for p40, supporting the diagnosis of metastatic lung adenocarcinoma. Molecular testing will be performed and results will be reported separately.    02/09/2015  A. Pleural fluid, right, thoracentesis  - Malignant cells present  - Consistent with patient's known metastatic lung adenocarcinoma.    MOLECULAR TESTING:  +ALK re-arrangement on in house cytogenetics.

## 2017-04-04 NOTE — Unmapped (Signed)
refill 

## 2017-04-15 NOTE — Unmapped (Signed)
[facility] Refill Coordination Note  Specialty Medication(s): ALCENSA      Angelica [name], DOB: [birth year]  Phone: [phone] (home) , Alternate phone contact: N/A  Phone or address changes today?: No  All above HIPAA information was verified with patient.  Shipping Address: [address]  [place] [address]   Insurance changes? No    Completed refill call assessment today to schedule patient's medication shipment from the [facility] [phone]).      Confirmed the medication and dosage are correct and have not changed: Yes, regimen is correct and unchanged.    Confirmed patient started or stopped the following medications in the past month:  No, there are no changes reported at this time.    Are you tolerating your medication?:  [name] reports tolerating the medication.    ADHERENCE    (Below is required for Medicare Part B or Transplant patients only - per drug):   How many tablets were dispensed last month: 240  Patient currently has 2 WEEKS remaining.    Did you miss any doses in the past 4 weeks? No missed doses reported.    FINANCIAL/SHIPPING    Delivery Scheduled: Yes, Expected medication delivery date: 04/20/17     [name] did not have any additional questions at this time.    Delivery address validated in [facility] scheduling system: Yes, address listed in [facility] is correct.    We will follow up with patient monthly for standard refill processing and delivery.      Thank you,  Westley Gambles   [facility] Specialty Technician

## 2017-04-18 ENCOUNTER — Encounter
Admit: 2017-04-18 | Discharge: 2017-04-19 | Payer: PRIVATE HEALTH INSURANCE | Attending: Vascular Surgery | Primary: Vascular Surgery

## 2017-04-18 DIAGNOSIS — L98491 Non-pressure chronic ulcer of skin of other sites limited to breakdown of skin: Principal | ICD-10-CM

## 2017-04-18 MED ORDER — PANTOPRAZOLE 20 MG TABLET,DELAYED RELEASE
ORAL_TABLET | 0 refills | 0 days | Status: CP
Start: 2017-04-18 — End: ?

## 2017-04-18 NOTE — Unmapped (Signed)
BP 154/68  - Pulse 83  - Temp 36.7 ??C (98 ??F) (Oral)  - Ht 165.1 cm (5' 5)  - Wt (!) 101.2 kg (223 lb)  - LMP  (LMP Unknown)  - BMI 37.11 kg/m??     Wound Back Left;Upper pink unapproximated no drainage (Active)       Wound 09/15/16 Leg Left (Active)   Wound Status Not Healed 04/18/2017  1:49 PM   Pain 0 04/18/2017  1:49 PM   Dressing Status      Removed 04/18/2017  1:49 PM   Wound Length (cm)      8.2 04/18/2017  1:49 PM   Wound Width (cm)      20 04/18/2017  1:49 PM   Wound Depth (cm)      0.2 04/18/2017  1:49 PM   Area (sq cm) (Calculated) 164 04/18/2017  1:49 PM   Volume (mL) (Calculated) 32.8 04/18/2017  1:49 PM   Wound Length % Healed (Calculated) 4.65 % 04/18/2017  1:49 PM   Wound Width % Healed (Calculated) 18.03 % 04/18/2017  1:49 PM   Wound Depth % Healed (Calculated) 0 % 04/18/2017  1:49 PM   Area (sq cm) % Healed (Calculated) 21.85 % 04/18/2017  1:49 PM   Volume (mL) % Healed (Calculated) 21.85 % 04/18/2017  1:49 PM   Odor None 04/18/2017  1:49 PM   Margins Undefined edges 04/18/2017  1:49 PM   Peri-wound Assessment      Dry;Edema 04/18/2017  1:49 PM   Encounter Subsequent 04/18/2017  1:49 PM   Slough % 1-25% 04/18/2017  1:49 PM   Eschar % None 04/18/2017  1:49 PM   Epithelialization % 26-50% 04/18/2017  1:49 PM   Granulation % 26-50% 04/18/2017  1:49 PM   Exudate Type      Sero-sanguineous 04/18/2017  1:49 PM   Exudate Amnt      Moderate 04/18/2017  1:49 PM   Thickness Full Thickness 04/18/2017  1:49 PM   Exposed Structure N/A 04/18/2017  1:49 PM   Texture Edema 04/18/2017  1:49 PM   Moisture Moist 04/18/2017  1:49 PM   Temperature WNL 04/18/2017  1:49 PM   Hypergranuation No 04/18/2017  1:49 PM   Tunneling      No 04/18/2017  1:49 PM   Undermining     No 04/18/2017  1:49 PM   Sinus Tract No 04/18/2017  1:49 PM   Treatments Cleansed/Irrigation;Pharmaceutical agent 04/18/2017  1:49 PM   Picture Taken Yes 04/18/2017  1:49 PM   Dressing Other (Comment) 03/21/2017  1:50 PM     This is a patient who had nearly circumferential wounds of the left calf.  She had multiple modalities with regard to treatment.  1 of which included the laser.  She is also had split thickness skin graft.  A large amount of this is taken.    On physical exam the patient is awake alert in no acute distress.  She is able to communicate without difficulty    Vital signs are reviewed and the patient's afebrile    Wounds continue to make excellent progress.  The dimensions are less the granulation tissue is healthy and there is increased epithelialization.  There was some desiccated skin around the wound and this was removed with the use of    Impression healing wound of left lower extremity    Plan we will continue with her present dressing.  If we have access to the split-thickness skin graft device she will  be enrolled in the program.

## 2017-04-18 NOTE — Unmapped (Signed)
Wound Care Instructions:    First, cleanse hands with antibacterial soap and water or hand sanitizer solution. Then, begin wound care.     Perform daily wound cleansing and dressing changes. Cleanse wound with Vashe or Anasept cleanser.    Continue with you current dressing of [place], drawtex, abd, kerlix, and Surepress every other day  Other days apply Iodofor foam, abd, kerlix, and surepress.    Dressing change daily.          If your wound starts to develop the following , please call the [facility] for further advise:    ??  Increased drainage  ??  Redness around the wound  ??  Strong odor from the wound when changing the bandages  ??  Increased pain    Please do not hesitate to leave a voicemail on the nurse line. We make every effort to return your call the same day or the next day. Please leave a clear message with your name, date of birth,  and your medical record number. Leave a brief description of your problem.    If you are experiencing the following, please call us for advise or consider going to the nearest local Emergency Department or call 911.    ??  Fever of 100 F  ??  Nausea or Vomitting  ??  Pus draining from your wound  ??  Redness of the whole foot or leg  ??  Severe increase in pain above your baseline.    [facility]  [phone]          If your wound starts to develop the following , please call the [facility] for further advise:    ??  Increased drainage  ??  Redness around the wound  ??  Strong odor from the wound when changing the bandages  ??  Increased pain    Please do not hesitate to leave a voicemail on the nurse line. We make every effort to return your call the same day or the next day. Please leave a clear message with your name, date of birth,  and your medical record number. Leave a brief description of your problem.    If you are experiencing the following, please call us for advise or consider going to the nearest local Emergency Department or call 911.    ??  Fever of 100 F  ??  Nausea or Vomitting  ??  Pus draining from your wound  ??  Redness of the whole foot or leg  ??  Severe increase in pain above your baseline.    [facility]  [phone]

## 2017-04-19 MED FILL — ALECENSA/150MG/CAPS: ALECENSA/150MG/CAPS | 30 days supply | Qty: 240 | Fill #6

## 2017-04-19 NOTE — Unmapped (Signed)
LM regarding Medicare survey.  Her insurance did not get completely put in until after her checkin--the survey was not completed.  This was 2nd attempt.

## 2017-04-20 ENCOUNTER — Encounter: Admit: 2017-04-20 | Discharge: 2017-04-21 | Payer: PRIVATE HEALTH INSURANCE

## 2017-04-20 DIAGNOSIS — E8770 Fluid overload, unspecified: Principal | ICD-10-CM

## 2017-04-20 LAB — PRO-BNP
Natriuretic peptide.B prohormone N-Terminal:MCnc:Pt:Ser/Plas:Qn:: 131
PRO-BNP: 131 pg/mL (ref 0.0–353.0)

## 2017-04-20 MED ORDER — ISOSORBIDE MONONITRATE ER 30 MG TABLET,EXTENDED RELEASE 24 HR
ORAL_TABLET | Freq: Every day | ORAL | 6 refills | 0.00000 days | Status: CP
Start: 2017-04-20 — End: 2017-05-20

## 2017-04-21 NOTE — Unmapped (Signed)
[facility]  Cardiovascular Medicine Clinic    Requesting Provider: Caroll Rancher, MD  Primary Provider: Quentin Ore, FNP    Assessment and Plan     Angelica [name] is a 67 y.o. female who presents to cardiology clinic for:    Pulmonary edema: Etiology of the patient's pulmonary edema is uncertain at this time.  She was referred for evaluation of cardiac causes.  Fortunately, her echocardiogram is normal, proBNP is currently not elevated and there does not appear to be a specific cardiac component to her pulmonary edema and effusion. Certainly, other etiologies such as her chemotherapy could be contributing although this has not been able to be shown as the cause. She was off chemo due to this concern but is now back on another cycle and tolerating it well from a pulmonary standpoint. We discussed using some long acting nitrates to help with venodilation given her history of hypertension also. She was agreeable to this as a trial. In the interim, we discussed using lasix as needed for SOB and pulmonary edema. Her exam today is not consistent with volume overload so I do not believe this needs to be started immediately.   -Imdur 30 mg daily   -Lasix 20 mg daily as needed  -ProBNP 131    Fellow: Lyndee Hensen, MD  Attending: Schuyler Amor, MD   Return if symptoms worsen or fail to improve.    History     HPI    Angelica [name] is a 67 y.o. year old female with history of herpes zoster infection, hypertension, stage IV adenocarcinoma who presents for evaluation of pulmonary edema.    Patient states that she is currently being treated for adenocarcinoma after it was incidentally found during a herpes zoster infection.  She says that her leg broke out in a rash which was found to be herpes zoster and after she continued to have pulmonary symptoms she had a chest CT which revealed metastatic adenocarcinoma with metastases to her sternum and spine.  She was started on chemotherapy but eventually had progression of her disease.  She was then started on a second line treatment and developed pulmonary edema and shortness of breath.  Her ongoing CTs have revealed pulmonary edema with groundglass opacities.  There was some question as to whether her groundglass opacities were secondary to chemotherapy versus new onset heart failure.  Patient had an echocardiogram performed which showed normal ejection fraction without significant diastolic dysfunction.  During this episode she had a proBNP which was slightly elevated at 234.  She eventually underwent bronchoscopy to evaluate the etiology of her pulmonary edema which she states was ultimately unrevealing.  She took a holiday from her second line chemotherapy but is now back on it and has not been experiencing any further pulmonary edema.  She presents today for further evaluation of this pulmonary edema and whether the etiology could be related to cardiac function.    Currently she denies any shortness of breath more than her baseline.  She denies any lower extremity edema.  She currently denies any orthopnea or PND.  She states that she has no previous cardiac history and does endorse some family cardiac history in her elderly parents.    ROS  Review of ten systems is negative or unremarkable except as stated above.  _____________________________________________________________________    CARDIOVASCULAR HISTORY  ?? None    Coronary Interventions and Surgeries  ?? None    EP Procedures  and Devices  ?? None    Imaging and Noninvasive Studies  ?? EKG normal sinus rhythm, rate 81  ?? Echocardiogram 02/16/2017  ?? Technically difficult study due to chest wall/lung interference  ?? Normal left ventricular systolic function, ejection fraction 60 to 65%  ?? Dilated left atrium - mild  ?? Normal right ventricular systolic function  _____________________________________________________________________    Medical, Surgical, Social, and Family History reviewed in Epic. Medication and Allergy list reviewed and updated (see AVS). Pertinent data noted below.    Medical and Surgical History  Past Medical History:   Diagnosis Date   ??? Asthma    ??? Cellulitis    ??? Diverticulitis    ??? Hypertension 04/27/2015   ??? Lung cancer, lower lobe (CMS-HCC)     RIGHT   ??? Renal failure     AKI with failure now resolved   ??? Seasonal allergies    ??? Shingles      Past Surgical History:   Procedure Laterality Date   ??? PR BRONCHOSCOPY,DIAGNOSTIC N/A 01/16/2015    Procedure: BRONCHOSCOPY, RIGID OR FLEXIBLE, W/WO FLUOROSCOPIC GUIDANCE; DIAGNOSTIC, WITH CELL WASHING, WHEN PERFORMED;  Surgeon: Bethanie Dicker, MD;  Location: Endo Procedures [facility];  Service: Pulmonary   ??? PR BRONCHOSCOPY,DIAGNOSTIC W LAVAGE Bilateral 01/20/2017    Procedure: BRONCHOSCOPY, RIGID OR FLEXIBLE, INCLUDE FLUOROSCOPIC GUIDANCE WHEN PERFORMED; W/BRONCHIAL ALVEOLAR LAVAGE WITH MODERATE SEDATION ;  Surgeon: Maxwell Caul, MD;  Location: BRONCH PROCEDURE LAB [facility];  Service: Pulmonary   ??? PR DEBRIDEMENT, SKIN, SUB-Q TISSUE,MUSCLE,=<20 SQ CM Left 09/09/2016    Procedure: DEBRIDEMENT; SKIN, SUBCUTANEOUS TISSUE, & MUSCLE;  Surgeon: Thora Lance, MD;  Location: MAIN OR [facility];  Service: Vascular   ??? PR FINE NEEDLE ASP;W/IMAGING GUIDANCE Right 02/09/2015    Procedure: Fine Needle Aspiration; With Imaging Guidance;  Surgeon: Mathis Bud, MD;  Location: MAIN OR [facility];  Service: Pulmonary   ??? PR SPLIT GRFT TRUNK,ARM,LEG <100 SQCM Left 08/16/2016    Procedure: Split-Thickness Autograft, Trunk, Arms, Legs; 1st 100 Sq Cm Or Less, Or 1% Body Area Infant Or Child;  Surgeon: Thora Lance, MD;  Location: MAIN OR [facility];  Service: Vascular   ??? PR THORACENTESIS NEEDLE/CATH PLEURA W/IMAGING Right 02/09/2015    Procedure: Thoracentesis W/ Imaging;  Surgeon: Mathis Bud, MD;  Location: MAIN OR [facility];  Service: Pulmonary       Social History  Social History     Social History Narrative    Married, mother of 2. 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 Breast cancer Mother    ??? Multiple myeloma Sister    ??? Leukemia Brother    ??? Early death Father    ??? Heart disease Father    ??? Anesthesia problems Neg Hx    ??? Bleeding Disorder Neg Hx        Cardiac Medications  ?? Lisinopril, Lasix, Imdur    Physical Exam     VITAL SIGNS:  BP 138/66 (BP Site: L Arm, BP Position: Sitting, BP Cuff Size: Large)  - Pulse 90  - Resp 18  - Ht 160 cm (5' 3)  - Wt (!) 102.5 kg (226 lb)  - LMP  (LMP Unknown)  - SpO2 97%  - BMI 40.03 kg/m??   Wt Readings from Last 6 Encounters:   04/20/17 (!) 102.5 kg (226 lb)   04/18/17 (!) 101.2 kg (223  lb)   03/30/17 (!) 101.4 kg (223 lb 8 oz)   02/21/17 99.8 kg (220 lb 0.3 oz)   02/16/17 99.8 kg (220 lb)   02/02/17 98.1 kg (216 lb 4.3 oz)     GENERAL: Pleasant female, overweight, NAD   HEENT: Noromocephalic/atraumatic.Conjunctivae and sclerae clear and anicteric.  NECK: JVD is not elevated at 90 degrees  CARDIOVASCULAR:Regular rate and rhythm, no murmurs rubs or gallops, normal S1-S2  RESPIRATORY: Clear to auscultation bilaterally, no wheezes or rales  ABDOMEN: Soft, NT/ND, normal BS. No palpable organomegaly  EXTREMITIES:  No lower extremity edema on the right, left leg wrapped with gauze and not undressed   SKIN: No visible rashes, ecchymosis or petechiae.  NEUROLOGIC: Cranial nerves II through XII grossly intact, moves all extremities  PSYCH: appropriate affect    Laboratory Studies (See above for cardiac imaging and procedures)     No results found for: TRIG, HDL, NONHDL, LDL   Lab Results   Component Value Date    CREATININE 1.02 (H) 03/30/2017    CREATININE 0.7 01/09/2017    BUN 27 (H) 03/30/2017    BUN 17 05/26/2015    K 4.2 03/30/2017    K 3.7 05/26/2015    K 3.1 (L) 01/15/2015    MG 1.7 09/12/2016     Lab Results   Component Value Date    WBC 5.3 03/30/2017    WBC 5.8 04/27/2015    HGB 11.5 (L) 03/30/2017    HGB 9.3 (L) 04/27/2015    HGB 9.2 (L) 01/15/2015    HCT 34.6 (L) 03/30/2017    HCT 29.0 (L) 04/27/2015    PLT 234 03/30/2017    PLT 307 04/27/2015    INR 1.07 08/13/2015    INR 1.10 02/04/2015

## 2017-04-22 NOTE — Unmapped (Signed)
I reviewed the history, physical findings and diagnostic studies, discussed the findings, assessment and plan with the Resident and agree with the recommendation as documented in the Resident's note.  - Grecia Lynk H Aayush Gelpi, MD, FACC

## 2017-04-27 MED ORDER — GABAPENTIN 300 MG CAPSULE
ORAL_CAPSULE | Freq: Every evening | ORAL | 3 refills | 0 days | Status: CP
Start: 2017-04-27 — End: ?

## 2017-04-27 MED ORDER — POTASSIUM CHLORIDE ER 20 MEQ TABLET,EXTENDED RELEASE
ORAL_TABLET | Freq: Every day | ORAL | 8 refills | 0.00000 days | Status: CP
Start: 2017-04-27 — End: ?

## 2017-04-27 NOTE — Unmapped (Signed)
Please refill if appropriate.  Thanks, Olegario Messier

## 2017-05-08 ENCOUNTER — Encounter: Admit: 2017-05-08 | Discharge: 2017-05-08 | Payer: PRIVATE HEALTH INSURANCE

## 2017-05-08 DIAGNOSIS — C3490 Malignant neoplasm of unspecified part of unspecified bronchus or lung: Principal | ICD-10-CM

## 2017-05-08 MED ORDER — PANTOPRAZOLE 20 MG TABLET,DELAYED RELEASE
ORAL_TABLET | 0 refills | 0 days | Status: CP
Start: 2017-05-08 — End: 2017-06-04

## 2017-05-09 ENCOUNTER — Ambulatory Visit
Admit: 2017-05-09 | Discharge: 2017-05-10 | Payer: PRIVATE HEALTH INSURANCE | Attending: Vascular Surgery | Primary: Vascular Surgery

## 2017-05-09 DIAGNOSIS — L97921 Non-pressure chronic ulcer of unspecified part of left lower leg limited to breakdown of skin: Principal | ICD-10-CM

## 2017-05-09 DIAGNOSIS — I890 Lymphedema, not elsewhere classified: Secondary | ICD-10-CM

## 2017-05-09 NOTE — Unmapped (Signed)
Leg and Ankle Edema: Care Instructions  Your Care Instructions  Swelling in the legs, ankles, and feet is called edema. It is common after you sit or stand for a while. Long plane flights or car rides often cause swelling in the legs and feet. You may also have swelling if you have to stand for long periods of time at your job. Problems with the veins in the legs (varicose veins) and changes in hormones can also cause swelling. Sometimes the swelling in the ankles and feet is caused by a more serious problem, such as heart failure, infection, blood clots, or liver or kidney disease.  Follow-up care is a key part of your treatment and safety. Be sure to make and go to all appointments, and call your doctor if you are having problems. It's also a good idea to know your test results and keep a list of the medicines you take.  How can you care for yourself at home?  ?? If your doctor gave you medicine, take it as prescribed. Call your doctor if you think you are having a problem with your medicine.  ?? Whenever you are resting, raise your legs up. Try to keep the swollen area higher than the level of your heart.  ?? Take breaks from standing or sitting in one position.  ? Walk around to increase the blood flow in your lower legs.  ? Move your feet and ankles often while you stand, or tighten and relax your leg muscles.  ?? Wear support stockings. Put them on in the morning, before swelling gets worse.  ?? Eat a balanced diet. Lose weight if you need to.  ?? Limit the amount of salt (sodium) in your diet. Salt holds fluid in the body and may increase swelling.  When should you call for help?  Call 911 anytime you think you may need emergency care. For example, call if:  ?? ?? You have symptoms of a blood clot in your lung (called a pulmonary embolism). These may include:  ? Sudden chest pain.  ? Trouble breathing.  ? Coughing up blood.   ??Call your doctor now or seek immediate medical care if:  ?? ?? You have signs of a blood clot, such as:  ? Pain in your calf, back of the knee, thigh, or groin.  ? Redness and swelling in your leg or groin.   ?? ?? You have symptoms of infection, such as:  ? Increased pain, swelling, warmth, or redness.  ? Red streaks or pus.  ? A fever.   ??Watch closely for changes in your health, and be sure to contact your doctor if:  ?? ?? Your swelling is getting worse.   ?? ?? You have new or worsening pain in your legs.   ?? ?? You do not get better as expected.   Where can you learn more?  Go to [facility] at [email or web address]  Select Preferences in the upper right hand corner, then select Health Library under Resources. Enter [phone] in the search box to learn more about Leg and Ankle Edema: Care Instructions.  Current as of: December 25, 2016  Content Version: 11.9  ?? 2006-2018 [employer]. Care instructions adapted under license by [facility]. If you have questions about a medical condition or this instruction, always ask your healthcare professional. Healthwise, Incorporated disclaims any warranty or liability for your use of this information.    Wound care:    1)  Use one tsp hibiclens mixed with  2 cups water and spray over wound then gently wipe clean     2)  Then Spray Anasept and/or Dakins to the wound bed and surrounding area and let dry, or apply to a gauze pad then apply to the wound for 5 minutes, before applying the dressing.          If you are unable to tolerant any one of this steps, then stop that step.  Apply dressings as directed after the above instructions      Dressing: Iodofoam or Drawtex, ABD, Kerlix and Surepress    [employer]

## 2017-05-11 ENCOUNTER — Encounter
Admit: 2017-05-11 | Discharge: 2017-05-12 | Payer: PRIVATE HEALTH INSURANCE | Attending: Hematology & Oncology | Primary: Hematology & Oncology

## 2017-05-11 ENCOUNTER — Encounter: Admit: 2017-05-11 | Discharge: 2017-05-12 | Payer: PRIVATE HEALTH INSURANCE

## 2017-05-11 DIAGNOSIS — R4020 Unspecified coma: Secondary | ICD-10-CM

## 2017-05-11 DIAGNOSIS — L309 Dermatitis, unspecified: Secondary | ICD-10-CM

## 2017-05-11 DIAGNOSIS — C3491 Malignant neoplasm of unspecified part of right bronchus or lung: Secondary | ICD-10-CM

## 2017-05-11 DIAGNOSIS — C3490 Malignant neoplasm of unspecified part of unspecified bronchus or lung: Principal | ICD-10-CM

## 2017-05-11 DIAGNOSIS — Z8739 Personal history of other diseases of the musculoskeletal system and connective tissue: Secondary | ICD-10-CM

## 2017-05-11 DIAGNOSIS — Z79899 Other long term (current) drug therapy: Secondary | ICD-10-CM

## 2017-05-11 LAB — KETONES UA: Lab: NEGATIVE

## 2017-05-11 LAB — CBC W/ AUTO DIFF
BASOPHILS ABSOLUTE COUNT: 0 10*9/L (ref 0.0–0.1)
BASOPHILS RELATIVE PERCENT: 0.6 %
EOSINOPHILS ABSOLUTE COUNT: 0.1 10*9/L (ref 0.0–0.4)
EOSINOPHILS RELATIVE PERCENT: 1.5 %
HEMATOCRIT: 31.9 % — ABNORMAL LOW (ref 36.0–46.0)
LARGE UNSTAINED CELLS: 2 % (ref 0–4)
LYMPHOCYTES ABSOLUTE COUNT: 0.8 10*9/L — ABNORMAL LOW (ref 1.5–5.0)
LYMPHOCYTES RELATIVE PERCENT: 18.5 %
MEAN CORPUSCULAR HEMOGLOBIN CONC: 33.8 g/dL (ref 31.0–37.0)
MEAN CORPUSCULAR HEMOGLOBIN: 33 pg (ref 26.0–34.0)
MEAN CORPUSCULAR VOLUME: 97.6 fL (ref 80.0–100.0)
MEAN PLATELET VOLUME: 8.6 fL (ref 7.0–10.0)
MONOCYTES ABSOLUTE COUNT: 0.2 10*9/L (ref 0.2–0.8)
MONOCYTES RELATIVE PERCENT: 4.7 %
NEUTROPHILS ABSOLUTE COUNT: 3.2 10*9/L (ref 2.0–7.5)
NEUTROPHILS RELATIVE PERCENT: 72.7 %
PLATELET COUNT: 236 10*9/L (ref 150–440)
RED BLOOD CELL COUNT: 3.26 10*12/L — ABNORMAL LOW (ref 4.00–5.20)
RED CELL DISTRIBUTION WIDTH: 18.6 % — ABNORMAL HIGH (ref 12.0–15.0)
WBC ADJUSTED: 4.4 10*9/L — ABNORMAL LOW (ref 4.5–11.0)

## 2017-05-11 LAB — URINALYSIS WITH CULTURE REFLEX
BILIRUBIN UA: NEGATIVE
GLUCOSE UA: NEGATIVE
HYALINE CASTS: 5 /LPF — ABNORMAL HIGH (ref 0–1)
KETONES UA: NEGATIVE
NITRITE UA: NEGATIVE
PH UA: 6 (ref 5.0–9.0)
RBC UA: 1 /HPF (ref <=4)
SPECIFIC GRAVITY UA: 1.021 (ref 1.003–1.030)
UROBILINOGEN UA: 0.2
WBC UA: 4 /HPF (ref 0–5)

## 2017-05-11 LAB — COMPREHENSIVE METABOLIC PANEL
ALBUMIN: 4 g/dL (ref 3.5–5.0)
ALKALINE PHOSPHATASE: 211 U/L — ABNORMAL HIGH (ref 38–126)
ALT (SGPT): 22 U/L (ref 15–48)
ANION GAP: 9 mmol/L (ref 9–15)
AST (SGOT): 37 U/L (ref 14–38)
BILIRUBIN TOTAL: 0.7 mg/dL (ref 0.0–1.2)
BUN / CREAT RATIO: 24
CALCIUM: 8.9 mg/dL (ref 8.5–10.2)
CHLORIDE: 106 mmol/L (ref 98–107)
CO2: 24 mmol/L (ref 22.0–30.0)
CREATININE: 0.94 mg/dL (ref 0.60–1.00)
EGFR MDRD AF AMER: >=60 mL/min/{1.73_m2} (ref >=60)
EGFR MDRD NON AF AMER: >=60 mL/min/{1.73_m2} (ref >=60)
GLUCOSE RANDOM: 92 mg/dL (ref 65–179)
POTASSIUM: 4.5 mmol/L (ref 3.5–5.0)
PROTEIN TOTAL: 7.7 g/dL (ref 6.5–8.3)
SODIUM: 139 mmol/L (ref 135–145)

## 2017-05-11 LAB — CREATINE KINASE TOTAL: Creatine kinase:CCnc:Pt:Ser/Plas:Qn:: 42 — ABNORMAL LOW

## 2017-05-11 LAB — MONOCYTES RELATIVE PERCENT: Lab: 4.7

## 2017-05-11 LAB — ALBUMIN: Albumin:MCnc:Pt:Ser/Plas:Qn:: 4

## 2017-05-11 MED ORDER — TRIAMCINOLONE ACETONIDE 0.1 % TOPICAL CREAM
Freq: Two times a day (BID) | TOPICAL | 2 refills | 0 days | Status: CP
Start: 2017-05-11 — End: ?

## 2017-05-11 MED ORDER — HYDROCODONE 5 MG-ACETAMINOPHEN 325 MG TABLET
ORAL_TABLET | Freq: Four times a day (QID) | ORAL | 0 refills | 0 days | Status: CP
Start: 2017-05-11 — End: 2017-06-08

## 2017-05-11 MED ORDER — LORLATINIB 100 MG TABLET
ORAL_TABLET | Freq: Every day | ORAL | 3 refills | 0.00000 days | Status: CP
Start: 2017-05-11 — End: 2017-05-16

## 2017-05-11 MED ORDER — LORLATINIB 100 MG TABLET: 100 mg | tablet | Freq: Every day | 3 refills | 0 days | Status: AC

## 2017-05-11 MED ORDER — LORAZEPAM 0.5 MG TABLET
ORAL_TABLET | 0 refills | 0 days | Status: CP
Start: 2017-05-11 — End: 2017-06-08

## 2017-05-11 NOTE — Unmapped (Addendum)
Please have your local doctor check your cholesterol and triglycerides.   Use triamcinolone on your rash.  Take Lasix 20mg  daily.   See me back in about 4 weeks for a check up on lorlatinib.   Schedule a brain MRI for sometime in the next week or two.    For appointments & questions Monday through Friday 8 AM???5 PM     Please call [phone] or Toll free [phone]    For urgent clinical needs on Nights, Weekends or Holidays  Call [phone] and ask for the oncologist on call.     For appointment changes please contact during normal business hours.     Please visit PrivacyFever.[email or web address], a resource created just for family members and caregivers.  This website lists support services, how and where to ask for help. It has tools to assist you as you help [place] care for your loved one.    [facility]  [address]  [place], [place] [address]  [email or web address]

## 2017-05-12 NOTE — Unmapped (Signed)
Patient Active Problem List   Diagnosis   ??? Hypokalemia   ??? Cellulitis of left lower extremity   ??? Post-herpetic polyneuropathy   ??? Diarrhea   ??? Malnutrition (CMS-HCC)   ??? Anemia   ??? Weakness   ??? Acute cystitis without hematuria   ??? Non-traumatic rhabdomyolysis   ??? Elevated LFTs   ??? Primary adenocarcinoma of right lung (CMS-HCC)   ??? Hypoalbuminemia   ??? Need for pneumococcal vaccination   ??? Leg ulcer, left (CMS-HCC)   ??? Hypertension   ??? Vasculitis determined by biopsy of skin (CMS-HCC)   ??? Dermatitis   ??? Lymphedema of left leg   ??? Risk for falls   ??? Elevated serum creatinine   ??? S/P split thickness skin graft       Angelica [name] is a 67 year old female with the above medical problems who returns for routine follow-up of her left lower extremity calf ulcers.  She has a history of a split-thickness skin graft and the circumferential ulcer has now divided into 3 separate ulcers.  The area on the posterior calf and a small area on the anterior calf have completely epithelialized.  She continues to use a triple cleansing approach for cleaning the ulcers prior to application of Adaptic and Drawtex due to moderate drainage.  She has a lymphedema pump which she uses faithfully at least once a day if not twice per day and uses SurePress to control lower extremity edema.  Dressings are changed daily due to moderate drainage.  We are still keeping her in mind for an autologous skin graft device that we should be trialing soon in the wound.  She denies any recent fevers, chills, or night sweats.  No other new concerns.    Physical exam    General: 67 year old female in no apparent distress.    Blood pressure 162/61, pulse 78, temperature 36.7 ??C (98.1 ??F), temperature source Temporal, not currently breastfeeding.    Wound Back Left;Upper pink unapproximated no drainage (Active)       Wound 09/15/16 Leg Left medial, scattered (Active)   Wound Status Not Healed 05/09/2017  1:04 PM   Pain 0 05/09/2017  1:04 PM   Dressing Status Removed 05/09/2017  1:04 PM   Wound Length (cm)      9 05/09/2017  1:04 PM   Wound Width (cm)      8.7 05/09/2017  1:04 PM   Wound Depth (cm)      0.2 05/09/2017  1:04 PM   Area (sq cm) (Calculated) 78.3 05/09/2017  1:04 PM   Volume (mL) (Calculated) 15.66 05/09/2017  1:04 PM   Wound Length % Healed (Calculated) -4.65 % 05/09/2017  1:04 PM   Wound Width % Healed (Calculated) 64.34 % 05/09/2017  1:04 PM   Wound Depth % Healed (Calculated) 0 % 05/09/2017  1:04 PM   Area (sq cm) % Healed (Calculated) 62.69 % 05/09/2017  1:04 PM   Volume (mL) % Healed (Calculated) 62.69 % 05/09/2017  1:04 PM   Odor None 05/09/2017  1:04 PM   Margins Defined edges 05/09/2017  1:04 PM   Peri-wound Assessment      Edema 05/09/2017  1:04 PM   Encounter Subsequent 05/09/2017  1:04 PM   Slough % 26-50% 05/09/2017  1:04 PM   Eschar % None 05/09/2017  1:04 PM   Epithelialization % None 05/09/2017  1:04 PM   Granulation % 26-50% 05/09/2017  1:04 PM   Exudate Type      Sero-sanguineous 05/09/2017  1:04 PM   Exudate Amnt      Moderate 05/09/2017  1:04 PM   Thickness Full Thickness 05/09/2017  1:04 PM   Exposed Structure N/A 05/09/2017  1:04 PM   Texture Edema 05/09/2017  1:04 PM   Moisture Moist 05/09/2017  1:04 PM   Temperature WNL 05/09/2017  1:04 PM   Hypergranuation No 05/09/2017  1:04 PM   Tunneling      No 05/09/2017  1:04 PM   Undermining     No 05/09/2017  1:04 PM   Sinus Tract No 05/09/2017  1:04 PM   Treatments Cleansed/Irrigation 05/09/2017  1:04 PM   Picture Taken Yes 05/09/2017  1:04 PM   Length (Pre Debridement) 8.2 cm 04/18/2017  2:21 PM   Width (Pre Debridement) 20 cm 04/18/2017  2:21 PM   Depth (Pre Debridement) 0.2 cm 04/18/2017  2:21 PM   Pre-Procedure Pain 0 04/18/2017  2:21 PM   Time Out Taken Yes 04/18/2017  2:21 PM   Instrument Used curette 04/18/2017  2:21 PM   Tissue/Material Removed non-viable;slough;biofilm 04/18/2017  2:21 PM   Bleeding minimal 04/18/2017  2:21 PM   Bleeding controlled with pressure 04/18/2017  2:21 PM   Specimen Taken none 04/18/2017  2:21 PM   Type of Debridement selective 04/18/2017  2:21 PM   Level of Debridement subcutaneous tissue 04/18/2017  2:21 PM   Procedural Pain 0 04/18/2017  2:21 PM   Length (Post-Debridement) 8.2 cm 04/18/2017  2:21 PM   Width (Post Debridement) 20 cm 04/18/2017  2:21 PM   Depth (Post-Debridement) 0.2 cm 04/18/2017  2:21 PM   Post Procedural Pain 0 04/18/2017  2:21 PM   Area(Pre-Debridement) 164 sq cm 04/18/2017  2:21 PM   Area(Post-Debridement) 164 sq cm 04/18/2017  2:21 PM   Volume(Pre-Debridement) 32.8 sq cm 04/18/2017  2:21 PM   Volume(Post-Debridement) 32.8 sq cm 04/18/2017  2:21 PM       Wound 05/09/17 Left Lateral Leg, scattered (Active)   Wound Status Not Healed 05/09/2017  1:04 PM   Pain 0 05/09/2017  1:04 PM   Dressing Status      Removed 05/09/2017  1:04 PM   Wound Length (cm)      9.5 05/09/2017  1:04 PM   Wound Width (cm)      5 05/09/2017  1:04 PM   Wound Depth (cm)      0.2 05/09/2017  1:04 PM   Area (sq cm) (Calculated) 47.5 05/09/2017  1:04 PM   Volume (mL) (Calculated) 9.5 05/09/2017  1:04 PM   Odor None 05/09/2017  1:04 PM   Margins Undefined edges 05/09/2017  1:04 PM   Peri-wound Assessment      Edema 05/09/2017  1:04 PM   Encounter Subsequent 05/09/2017  1:04 PM   Slough % 26-50% 05/09/2017  1:04 PM   Eschar % None 05/09/2017  1:04 PM   Epithelialization % None 05/09/2017  1:04 PM   Granulation % 26-50% 05/09/2017  1:04 PM   Exudate Type      Sero-sanguineous 05/09/2017  1:04 PM   Exudate Amnt      Moderate 05/09/2017  1:04 PM   Thickness Full Thickness 05/09/2017  1:04 PM   Exposed Structure N/A 05/09/2017  1:04 PM   Texture Edema 05/09/2017  1:04 PM   Moisture Moist 05/09/2017  1:04 PM   Temperature WNL 05/09/2017  1:04 PM   Hypergranuation No 05/09/2017  1:04 PM   Tunneling      No 05/09/2017  1:04 PM   Undermining     No 05/09/2017  1:04 PM   Sinus Tract No 05/09/2017  1:04 PM   Treatments Cleansed/Irrigation;Pharmaceutical agent 05/09/2017  1:04 PM   Picture Taken Yes 05/09/2017  1:04 PM       Wound 05/09/17 Left Leg Anterior, scattered (Active)   Wound Status Not Healed 05/09/2017  1:04 PM   Pain 0 05/09/2017  1:04 PM   Dressing Status      Removed 05/09/2017  1:04 PM   Wound Length (cm)      6 05/09/2017  1:04 PM   Wound Width (cm)      3.5 05/09/2017  1:04 PM   Wound Depth (cm)      0.2 05/09/2017  1:04 PM   Area (sq cm) (Calculated) 21 05/09/2017  1:04 PM   Volume (mL) (Calculated) 4.2 05/09/2017  1:04 PM   Odor None 05/09/2017  1:04 PM   Margins Undefined edges 05/09/2017  1:04 PM   Peri-wound Assessment      Edema 05/09/2017  1:04 PM   Encounter Subsequent 05/09/2017  1:04 PM   Slough % 26-50% 05/09/2017  1:04 PM   Eschar % None 05/09/2017  1:04 PM   Epithelialization % None 05/09/2017  1:04 PM   Granulation % 26-50% 05/09/2017  1:04 PM   Exudate Type      Sero-sanguineous 05/09/2017  1:04 PM   Exudate Amnt      Moderate 05/09/2017  1:04 PM   Thickness Full Thickness 05/09/2017  1:04 PM   Exposed Structure N/A 05/09/2017  1:04 PM   Texture Edema 05/09/2017  1:04 PM   Moisture Moist 05/09/2017  1:04 PM   Temperature WNL 05/09/2017  1:04 PM   Hypergranuation No 05/09/2017  1:04 PM   Tunneling      No 05/09/2017  1:04 PM   Undermining     No 05/09/2017  1:04 PM   Sinus Tract No 05/09/2017  1:04 PM   Treatments Cleansed/Irrigation;Pharmaceutical agent 05/09/2017  1:04 PM   Picture Taken Yes 05/09/2017  1:04 PM     Extremities: The ulcers are exceptionally clean without biofilm or evidence of infection.  There are multiple areas which have completely resolved.  Her lower extremity edema is very well controlled.  Pedal pulses are strongly palpable.    Impression: Left calf ulcers and lymphedema in an immunocompromised patient    Treatment plan    1.  She will continue to cleanse the wound with a triple cleansing approach using Hibiclens, acetic acid, and Dakin solution.    2.  Continue with managing drainage and exudate with Drawtex.  Adaptic is to keep the Drawtex from sticking.    3.  Compression using lymphedema pump 2 times per day and using SurePress compression wraps in between.    4.  Return to clinic in approximately 3-4 weeks for routine evaluation and dressing change and she will report any new concerns immediately.  Return precautions reviewed in detail.

## 2017-05-15 MED ORDER — SERTRALINE 100 MG TABLET
ORAL_TABLET | 1 refills | 0 days | Status: CP
Start: 2017-05-15 — End: 2017-06-28

## 2017-05-15 NOTE — Unmapped (Signed)
Angelica [name] is discontinuing the Alecensa and will be changed to a new drug.

## 2017-05-15 NOTE — Unmapped (Signed)
Call to pt to set up next appt. Refills given but needs to be seen.  Carrol Hougland L. Gwinda Passe, MA   [facility]   [phone]

## 2017-05-16 MED ORDER — LORLATINIB 100 MG TABLET
ORAL_TABLET | Freq: Every day | ORAL | 6 refills | 0.00000 days | Status: CP
Start: 2017-05-16 — End: ?

## 2017-05-18 ENCOUNTER — Encounter: Admit: 2017-05-18 | Discharge: 2017-05-19 | Payer: PRIVATE HEALTH INSURANCE

## 2017-05-18 DIAGNOSIS — C3491 Malignant neoplasm of unspecified part of right bronchus or lung: Principal | ICD-10-CM

## 2017-05-19 NOTE — Unmapped (Signed)
Clinical Pharmacist Practitioner Thoracic Clinic: PHONE CONSULT    Angelica [name] is a 67 y.o. year old with stage IV ALK+ adenocarcinoma of the lung, who I am calling today to update her on current status of her Norco and lorlatinib prescriptions.   Her Norco prescription required a PA through her insurance. I called [employer] to complete the [place]. I then called and confirmed with [facility] that the prescription was run through insurance for a copay of ~ $17.00.  They were going to fill it today and can be picked up this evening.        I also called [facility] to expedite the processing of her lorlatinib prescription. There was a question on the instructions of the prescription because the patient thought she was supposed to take 2 tablets a day.  I confirmed that the instructions on the prescription for one tablet daily were correct. The PA has already been completed for this medication and they said that they would contact the patient later today or on Monday to set up delivery. We got the patient set up with a copay card, however, [facility] (twice now) has not run the prescription with the copay card information. We again requested that they use the copay card for the patient.            I finally spoke to [name] to tell her that she could pick up her Norco prescription from [facility] this evening, the lorlatinib instructions are one tablet (100 mg) daily, and that she should be receiving a telephone call from [facility] by Monday to set up delivery of lorlatinib.     Rozanna Boer, PharmD, MSPS  PGY2 Hematology/Oncology Pharmacy Resident  Pager: [phone]    I discussed the patient with the resident and agree with the history and plan provided in the note.     Leotis Shames, PharmD, BCOP, CPP  Pager [phone]

## 2017-05-22 NOTE — Unmapped (Signed)
Patient dis-enrolled in [facility] specialty. Switching medication to lorlatinib and patient no longer able to fill at [facility] due to insurance restrictions.     Leotis Shames, PharmD, BCOP, CPP  Pager [phone]

## 2017-05-25 ENCOUNTER — Encounter: Admit: 2017-05-25 | Discharge: 2017-05-26 | Payer: PRIVATE HEALTH INSURANCE

## 2017-05-25 DIAGNOSIS — E8770 Fluid overload, unspecified: Principal | ICD-10-CM

## 2017-05-25 DIAGNOSIS — Z79899 Other long term (current) drug therapy: Secondary | ICD-10-CM

## 2017-05-25 DIAGNOSIS — C3491 Malignant neoplasm of unspecified part of right bronchus or lung: Secondary | ICD-10-CM

## 2017-05-30 ENCOUNTER — Encounter
Admit: 2017-05-30 | Discharge: 2017-05-31 | Payer: PRIVATE HEALTH INSURANCE | Attending: Vascular Surgery | Primary: Vascular Surgery

## 2017-05-30 DIAGNOSIS — L97921 Non-pressure chronic ulcer of unspecified part of left lower leg limited to breakdown of skin: Principal | ICD-10-CM

## 2017-05-31 ENCOUNTER — Encounter
Admit: 2017-05-31 | Discharge: 2017-06-01 | Payer: PRIVATE HEALTH INSURANCE | Attending: Hematology & Oncology | Primary: Hematology & Oncology

## 2017-05-31 ENCOUNTER — Ambulatory Visit
Admit: 2017-05-31 | Discharge: 2017-06-01 | Payer: PRIVATE HEALTH INSURANCE | Attending: Radiation Oncology | Primary: Radiation Oncology

## 2017-05-31 DIAGNOSIS — R21 Rash and other nonspecific skin eruption: Principal | ICD-10-CM

## 2017-05-31 DIAGNOSIS — C3490 Malignant neoplasm of unspecified part of unspecified bronchus or lung: Secondary | ICD-10-CM

## 2017-05-31 DIAGNOSIS — C7931 Secondary malignant neoplasm of brain: Principal | ICD-10-CM

## 2017-06-02 ENCOUNTER — Encounter
Admit: 2017-06-02 | Discharge: 2017-07-02 | Payer: PRIVATE HEALTH INSURANCE | Attending: Internal Medicine | Primary: Internal Medicine

## 2017-06-02 ENCOUNTER — Encounter: Admit: 2017-06-02 | Discharge: 2017-07-02 | Payer: PRIVATE HEALTH INSURANCE

## 2017-06-02 ENCOUNTER — Encounter
Admit: 2017-06-02 | Discharge: 2017-07-02 | Payer: PRIVATE HEALTH INSURANCE | Attending: Radiation Oncology | Primary: Radiation Oncology

## 2017-06-02 ENCOUNTER — Ambulatory Visit
Admit: 2017-06-02 | Discharge: 2017-07-02 | Payer: PRIVATE HEALTH INSURANCE | Attending: Radiation Oncology | Primary: Radiation Oncology

## 2017-06-02 ENCOUNTER — Non-Acute Institutional Stay
Admit: 2017-06-02 | Discharge: 2017-07-02 | Payer: PRIVATE HEALTH INSURANCE | Attending: Radiation Oncology | Primary: Radiation Oncology

## 2017-06-02 DIAGNOSIS — C3490 Malignant neoplasm of unspecified part of unspecified bronchus or lung: Principal | ICD-10-CM

## 2017-06-05 MED ORDER — PROCHLORPERAZINE MALEATE 10 MG TABLET
ORAL_TABLET | 0 refills | 0 days | Status: CP
Start: 2017-06-05 — End: 2017-06-19

## 2017-06-05 MED ORDER — PANTOPRAZOLE 20 MG TABLET,DELAYED RELEASE
ORAL_TABLET | 0 refills | 0 days | Status: CP
Start: 2017-06-05 — End: 2017-07-05

## 2017-06-08 ENCOUNTER — Ambulatory Visit: Admit: 2017-06-08 | Discharge: 2017-06-08 | Payer: PRIVATE HEALTH INSURANCE

## 2017-06-08 ENCOUNTER — Encounter: Admit: 2017-06-08 | Discharge: 2017-06-08 | Payer: PRIVATE HEALTH INSURANCE

## 2017-06-08 ENCOUNTER — Encounter
Admit: 2017-06-08 | Discharge: 2017-06-08 | Payer: PRIVATE HEALTH INSURANCE | Attending: Hematology & Oncology | Primary: Hematology & Oncology

## 2017-06-08 DIAGNOSIS — C7931 Secondary malignant neoplasm of brain: Principal | ICD-10-CM

## 2017-06-08 DIAGNOSIS — C3491 Malignant neoplasm of unspecified part of right bronchus or lung: Secondary | ICD-10-CM

## 2017-06-08 DIAGNOSIS — C3490 Malignant neoplasm of unspecified part of unspecified bronchus or lung: Principal | ICD-10-CM

## 2017-06-08 DIAGNOSIS — Z79899 Other long term (current) drug therapy: Secondary | ICD-10-CM

## 2017-06-08 MED ORDER — HYDROCODONE 5 MG-ACETAMINOPHEN 325 MG TABLET
ORAL_TABLET | Freq: Four times a day (QID) | ORAL | 0 refills | 0.00000 days | Status: CP
Start: 2017-06-08 — End: ?

## 2017-06-08 MED ORDER — LORAZEPAM 0.5 MG TABLET
ORAL_TABLET | 0 refills | 0 days | Status: CP
Start: 2017-06-08 — End: ?

## 2017-06-09 DIAGNOSIS — C7931 Secondary malignant neoplasm of brain: Principal | ICD-10-CM

## 2017-06-19 MED ORDER — PROCHLORPERAZINE MALEATE 5 MG TABLET
ORAL_TABLET | 0 refills | 0 days | Status: CP
Start: 2017-06-19 — End: ?

## 2017-06-20 ENCOUNTER — Encounter
Admit: 2017-06-20 | Discharge: 2017-06-21 | Payer: PRIVATE HEALTH INSURANCE | Attending: Vascular Surgery | Primary: Vascular Surgery

## 2017-06-20 DIAGNOSIS — T148XXA Other injury of unspecified body region, initial encounter: Principal | ICD-10-CM

## 2017-06-27 ENCOUNTER — Encounter: Admit: 2017-06-27 | Discharge: 2017-06-28 | Payer: PRIVATE HEALTH INSURANCE

## 2017-06-27 DIAGNOSIS — C3491 Malignant neoplasm of unspecified part of right bronchus or lung: Secondary | ICD-10-CM

## 2017-06-27 DIAGNOSIS — C3490 Malignant neoplasm of unspecified part of unspecified bronchus or lung: Principal | ICD-10-CM

## 2017-06-27 DIAGNOSIS — C7931 Secondary malignant neoplasm of brain: Principal | ICD-10-CM

## 2017-06-27 DIAGNOSIS — R609 Edema, unspecified: Secondary | ICD-10-CM

## 2017-06-28 ENCOUNTER — Encounter: Admit: 2017-06-28 | Discharge: 2017-06-29 | Payer: PRIVATE HEALTH INSURANCE

## 2017-06-28 DIAGNOSIS — C7931 Secondary malignant neoplasm of brain: Principal | ICD-10-CM

## 2017-06-28 DIAGNOSIS — F418 Other specified anxiety disorders: Secondary | ICD-10-CM

## 2017-06-28 DIAGNOSIS — F324 Major depressive disorder, single episode, in partial remission: Principal | ICD-10-CM

## 2017-06-28 MED ORDER — SERTRALINE 100 MG TABLET
ORAL_TABLET | Freq: Every day | ORAL | 0 refills | 0 days | Status: CP
Start: 2017-06-28 — End: ?

## 2017-06-29 ENCOUNTER — Other Ambulatory Visit: Payer: Self-pay

## 2017-06-29 ENCOUNTER — Inpatient Hospital Stay (HOSPITAL_BASED_OUTPATIENT_CLINIC_OR_DEPARTMENT_OTHER)
Admission: EM | Admit: 2017-06-29 | Discharge: 2017-07-21 | DRG: 640 | Disposition: A | Discharge status: Skilled Nursing Facility | Payer: BLUE CROSS/BLUE SHIELD | Attending: Family Medicine | Admitting: Family Medicine

## 2017-06-29 ENCOUNTER — Encounter (HOSPITAL_BASED_OUTPATIENT_CLINIC_OR_DEPARTMENT_OTHER): Payer: Self-pay

## 2017-06-29 DIAGNOSIS — C7931 Secondary malignant neoplasm of brain: Secondary | ICD-10-CM | POA: Diagnosis present

## 2017-06-29 DIAGNOSIS — I4901 Ventricular fibrillation: Secondary | ICD-10-CM | POA: Diagnosis not present

## 2017-06-29 DIAGNOSIS — F419 Anxiety disorder, unspecified: Secondary | ICD-10-CM | POA: Diagnosis present

## 2017-06-29 DIAGNOSIS — G8929 Other chronic pain: Secondary | ICD-10-CM | POA: Diagnosis present

## 2017-06-29 DIAGNOSIS — G92 Toxic encephalopathy: Secondary | ICD-10-CM | POA: Diagnosis present

## 2017-06-29 DIAGNOSIS — E2740 Unspecified adrenocortical insufficiency: Secondary | ICD-10-CM | POA: Diagnosis present

## 2017-06-29 DIAGNOSIS — J9621 Acute and chronic respiratory failure with hypoxia: Secondary | ICD-10-CM

## 2017-06-29 DIAGNOSIS — I5031 Acute diastolic (congestive) heart failure: Secondary | ICD-10-CM | POA: Diagnosis not present

## 2017-06-29 DIAGNOSIS — G629 Polyneuropathy, unspecified: Secondary | ICD-10-CM | POA: Diagnosis present

## 2017-06-29 DIAGNOSIS — R1312 Dysphagia, oropharyngeal phase: Secondary | ICD-10-CM | POA: Diagnosis present

## 2017-06-29 DIAGNOSIS — J9690 Respiratory failure, unspecified, unspecified whether with hypoxia or hypercapnia: Secondary | ICD-10-CM

## 2017-06-29 DIAGNOSIS — Y848 Other medical procedures as the cause of abnormal reaction of the patient, or of later complication, without mention of misadventure at the time of the procedure: Secondary | ICD-10-CM | POA: Diagnosis not present

## 2017-06-29 DIAGNOSIS — E861 Hypovolemia: Secondary | ICD-10-CM | POA: Diagnosis present

## 2017-06-29 DIAGNOSIS — R739 Hyperglycemia, unspecified: Secondary | ICD-10-CM | POA: Diagnosis not present

## 2017-06-29 DIAGNOSIS — N179 Acute kidney failure, unspecified: Secondary | ICD-10-CM | POA: Diagnosis not present

## 2017-06-29 DIAGNOSIS — Z452 Encounter for adjustment and management of vascular access device: Secondary | ICD-10-CM

## 2017-06-29 DIAGNOSIS — R570 Cardiogenic shock: Secondary | ICD-10-CM | POA: Diagnosis not present

## 2017-06-29 DIAGNOSIS — I8310 Varicose veins of unspecified lower extremity with inflammation: Secondary | ICD-10-CM | POA: Diagnosis present

## 2017-06-29 DIAGNOSIS — J189 Pneumonia, unspecified organism: Secondary | ICD-10-CM

## 2017-06-29 DIAGNOSIS — J9601 Acute respiratory failure with hypoxia: Secondary | ICD-10-CM

## 2017-06-29 DIAGNOSIS — Z7901 Long term (current) use of anticoagulants: Secondary | ICD-10-CM

## 2017-06-29 DIAGNOSIS — Z66 Do not resuscitate: Secondary | ICD-10-CM | POA: Diagnosis present

## 2017-06-29 DIAGNOSIS — S225XXA Flail chest, initial encounter for closed fracture: Secondary | ICD-10-CM | POA: Diagnosis not present

## 2017-06-29 DIAGNOSIS — I6340 Cerebral infarction due to embolism of unspecified cerebral artery: Secondary | ICD-10-CM | POA: Diagnosis not present

## 2017-06-29 DIAGNOSIS — J690 Pneumonitis due to inhalation of food and vomit: Secondary | ICD-10-CM | POA: Diagnosis not present

## 2017-06-29 DIAGNOSIS — E874 Mixed disorder of acid-base balance: Secondary | ICD-10-CM | POA: Diagnosis present

## 2017-06-29 DIAGNOSIS — Z4659 Encounter for fitting and adjustment of other gastrointestinal appliance and device: Secondary | ICD-10-CM

## 2017-06-29 DIAGNOSIS — C3490 Malignant neoplasm of unspecified part of unspecified bronchus or lung: Secondary | ICD-10-CM | POA: Diagnosis present

## 2017-06-29 DIAGNOSIS — J9622 Acute and chronic respiratory failure with hypercapnia: Secondary | ICD-10-CM | POA: Diagnosis not present

## 2017-06-29 DIAGNOSIS — I83228 Varicose veins of left lower extremity with both ulcer of other part of lower extremity and inflammation: Secondary | ICD-10-CM | POA: Diagnosis present

## 2017-06-29 DIAGNOSIS — E871 Hypo-osmolality and hyponatremia: Secondary | ICD-10-CM | POA: Diagnosis not present

## 2017-06-29 DIAGNOSIS — E162 Hypoglycemia, unspecified: Secondary | ICD-10-CM | POA: Diagnosis present

## 2017-06-29 DIAGNOSIS — R112 Nausea with vomiting, unspecified: Secondary | ICD-10-CM | POA: Diagnosis present

## 2017-06-29 DIAGNOSIS — F32A Depression, unspecified: Secondary | ICD-10-CM | POA: Diagnosis present

## 2017-06-29 DIAGNOSIS — R0602 Shortness of breath: Secondary | ICD-10-CM

## 2017-06-29 DIAGNOSIS — I2510 Atherosclerotic heart disease of native coronary artery without angina pectoris: Secondary | ICD-10-CM | POA: Diagnosis present

## 2017-06-29 DIAGNOSIS — Z6838 Body mass index (BMI) 38.0-38.9, adult: Secondary | ICD-10-CM

## 2017-06-29 DIAGNOSIS — R109 Unspecified abdominal pain: Secondary | ICD-10-CM

## 2017-06-29 DIAGNOSIS — E440 Moderate protein-calorie malnutrition: Secondary | ICD-10-CM | POA: Diagnosis present

## 2017-06-29 DIAGNOSIS — R066 Hiccough: Secondary | ICD-10-CM | POA: Diagnosis not present

## 2017-06-29 DIAGNOSIS — G931 Anoxic brain damage, not elsewhere classified: Secondary | ICD-10-CM | POA: Diagnosis not present

## 2017-06-29 DIAGNOSIS — D61818 Other pancytopenia: Secondary | ICD-10-CM | POA: Diagnosis present

## 2017-06-29 DIAGNOSIS — C787 Secondary malignant neoplasm of liver and intrahepatic bile duct: Secondary | ICD-10-CM | POA: Diagnosis present

## 2017-06-29 DIAGNOSIS — Z79899 Other long term (current) drug therapy: Secondary | ICD-10-CM

## 2017-06-29 DIAGNOSIS — I110 Hypertensive heart disease with heart failure: Secondary | ICD-10-CM | POA: Diagnosis present

## 2017-06-29 DIAGNOSIS — K219 Gastro-esophageal reflux disease without esophagitis: Secondary | ICD-10-CM | POA: Diagnosis present

## 2017-06-29 DIAGNOSIS — J441 Chronic obstructive pulmonary disease with (acute) exacerbation: Secondary | ICD-10-CM | POA: Diagnosis not present

## 2017-06-29 DIAGNOSIS — E669 Obesity, unspecified: Secondary | ICD-10-CM | POA: Diagnosis present

## 2017-06-29 DIAGNOSIS — I468 Cardiac arrest due to other underlying condition: Secondary | ICD-10-CM | POA: Diagnosis not present

## 2017-06-29 DIAGNOSIS — G9341 Metabolic encephalopathy: Secondary | ICD-10-CM | POA: Diagnosis not present

## 2017-06-29 DIAGNOSIS — F329 Major depressive disorder, single episode, unspecified: Secondary | ICD-10-CM | POA: Diagnosis present

## 2017-06-29 DIAGNOSIS — R197 Diarrhea, unspecified: Secondary | ICD-10-CM | POA: Diagnosis not present

## 2017-06-29 DIAGNOSIS — G928 Other toxic encephalopathy: Secondary | ICD-10-CM | POA: Diagnosis present

## 2017-06-29 DIAGNOSIS — D6489 Other specified anemias: Secondary | ICD-10-CM | POA: Diagnosis present

## 2017-06-29 DIAGNOSIS — E876 Hypokalemia: Secondary | ICD-10-CM | POA: Diagnosis not present

## 2017-06-29 DIAGNOSIS — I469 Cardiac arrest, cause unspecified: Secondary | ICD-10-CM

## 2017-06-29 DIAGNOSIS — I213 ST elevation (STEMI) myocardial infarction of unspecified site: Secondary | ICD-10-CM | POA: Diagnosis not present

## 2017-06-29 DIAGNOSIS — L97829 Non-pressure chronic ulcer of other part of left lower leg with unspecified severity: Secondary | ICD-10-CM | POA: Diagnosis present

## 2017-06-29 HISTORY — DX: Malignant neoplasm of brain, unspecified: C71.9

## 2017-06-29 HISTORY — DX: Malignant neoplasm of liver, not specified as primary or secondary: C22.9

## 2017-06-29 LAB — COMPREHENSIVE METABOLIC PANEL
ALBUMIN: 3.5 g/dL (ref 3.5–5.0)
ALT: 12 U/L — AB (ref 14–54)
AST: 32 U/L (ref 15–41)
Alkaline Phosphatase: 106 U/L (ref 38–126)
Anion gap: 11 (ref 5–15)
BILIRUBIN TOTAL: 0.9 mg/dL (ref 0.3–1.2)
BUN: 7 mg/dL (ref 6–20)
CO2: 19 mmol/L — ABNORMAL LOW (ref 22–32)
CREATININE: 0.58 mg/dL (ref 0.44–1.00)
Calcium: 8.3 mg/dL — ABNORMAL LOW (ref 8.9–10.3)
Chloride: 86 mmol/L — ABNORMAL LOW (ref 101–111)
GFR calc Af Amer: >60 mL/min (ref >60)
GFR calc non Af Amer: >60 mL/min (ref >60)
GLUCOSE: 92 mg/dL (ref 65–99)
Potassium: 4 mmol/L (ref 3.5–5.1)
Sodium: 116 mmol/L — CL (ref 135–145)
TOTAL PROTEIN: 7.4 g/dL (ref 6.5–8.1)

## 2017-06-29 LAB — CBC WITH DIFFERENTIAL/PLATELET
BASOS ABS: 0 10*3/uL (ref 0.0–0.1)
BASOS PCT: 0 %
Eosinophils Absolute: 0 10*3/uL (ref 0.0–0.7)
Eosinophils Relative: 0 %
HEMATOCRIT: 30 % — AB (ref 36.0–46.0)
Hemoglobin: 10.6 g/dL — ABNORMAL LOW (ref 12.0–15.0)
Lymphocytes Relative: 10 %
Lymphs Abs: 0.4 10*3/uL — ABNORMAL LOW (ref 0.7–4.0)
MCH: 31.3 pg (ref 26.0–34.0)
MCHC: 35.3 g/dL (ref 30.0–36.0)
MCV: 88.5 fL (ref 78.0–100.0)
Monocytes Absolute: 0.4 10*3/uL (ref 0.1–1.0)
Monocytes Relative: 10 %
NEUTROS ABS: 3.3 10*3/uL (ref 1.7–7.7)
NEUTROS PCT: 80 %
Platelets: 190 10*3/uL (ref 150–400)
RBC: 3.39 MIL/uL — AB (ref 3.87–5.11)
RDW: 13.9 % (ref 11.5–15.5)
WBC: 4.2 10*3/uL (ref 4.0–10.5)

## 2017-06-29 MED ORDER — SODIUM CHLORIDE 0.9 % IV BOLUS
1000.0000 mL | Freq: Once | INTRAVENOUS | Status: AC
  Administered 2017-06-29: 1000 mL via INTRAVENOUS

## 2017-06-29 MED ORDER — ONDANSETRON HCL 4 MG/2ML IJ SOLN
4.0000 mg | Freq: Once | INTRAMUSCULAR | Status: DC
Start: 2017-06-29 — End: 2017-06-30
  Filled 2017-06-29: qty 2

## 2017-06-29 MED ORDER — DEXAMETHASONE SODIUM PHOSPHATE 10 MG/ML IJ SOLN
10.0000 mg | Freq: Once | INTRAMUSCULAR | Status: AC
  Administered 2017-06-29: 10 mg via INTRAVENOUS
  Filled 2017-06-29: qty 1

## 2017-06-29 NOTE — ED Notes (Signed)
Date and time results received: 06/29/17 2345 (use smartphrase ".now" to insert current time)  Test: sodium  Critical Value: 116  Name of Provider Notified: Dr. Randal Buba  Orders Received? Or Actions Taken?: awaiting additional orders.

## 2017-06-29 NOTE — ED Notes (Signed)
ED Provider at bedside. 

## 2017-06-29 NOTE — ED Notes (Signed)
Normal Saline stopped due to EDP verbal orders.

## 2017-06-29 NOTE — ED Triage Notes (Signed)
Pt states she is a cancer pt-radiation tx Tuesday and Wednesday this week-n/v started 2 days prior to radiation-was advised by Onc at [facility] to come to ED-NAD-slow gait with own walker

## 2017-06-30 ENCOUNTER — Emergency Department (HOSPITAL_BASED_OUTPATIENT_CLINIC_OR_DEPARTMENT_OTHER): Payer: BLUE CROSS/BLUE SHIELD

## 2017-06-30 ENCOUNTER — Encounter (HOSPITAL_COMMUNITY): Payer: Self-pay | Admitting: Emergency Medicine

## 2017-06-30 DIAGNOSIS — E871 Hypo-osmolality and hyponatremia: Secondary | ICD-10-CM | POA: Diagnosis present

## 2017-06-30 DIAGNOSIS — I509 Heart failure, unspecified: Secondary | ICD-10-CM | POA: Diagnosis not present

## 2017-06-30 DIAGNOSIS — I5031 Acute diastolic (congestive) heart failure: Secondary | ICD-10-CM | POA: Diagnosis not present

## 2017-06-30 DIAGNOSIS — G9341 Metabolic encephalopathy: Secondary | ICD-10-CM | POA: Diagnosis not present

## 2017-06-30 DIAGNOSIS — Y848 Other medical procedures as the cause of abnormal reaction of the patient, or of later complication, without mention of misadventure at the time of the procedure: Secondary | ICD-10-CM | POA: Diagnosis not present

## 2017-06-30 DIAGNOSIS — G9340 Encephalopathy, unspecified: Secondary | ICD-10-CM | POA: Diagnosis not present

## 2017-06-30 DIAGNOSIS — S225XXA Flail chest, initial encounter for closed fracture: Secondary | ICD-10-CM | POA: Diagnosis not present

## 2017-06-30 DIAGNOSIS — N179 Acute kidney failure, unspecified: Secondary | ICD-10-CM | POA: Diagnosis not present

## 2017-06-30 DIAGNOSIS — M79609 Pain in unspecified limb: Secondary | ICD-10-CM | POA: Diagnosis not present

## 2017-06-30 DIAGNOSIS — G931 Anoxic brain damage, not elsewhere classified: Secondary | ICD-10-CM | POA: Diagnosis not present

## 2017-06-30 DIAGNOSIS — I2102 ST elevation (STEMI) myocardial infarction involving left anterior descending coronary artery: Secondary | ICD-10-CM | POA: Diagnosis not present

## 2017-06-30 DIAGNOSIS — J189 Pneumonia, unspecified organism: Secondary | ICD-10-CM | POA: Diagnosis not present

## 2017-06-30 DIAGNOSIS — R570 Cardiogenic shock: Secondary | ICD-10-CM | POA: Diagnosis not present

## 2017-06-30 DIAGNOSIS — E2740 Unspecified adrenocortical insufficiency: Secondary | ICD-10-CM | POA: Diagnosis present

## 2017-06-30 DIAGNOSIS — J441 Chronic obstructive pulmonary disease with (acute) exacerbation: Secondary | ICD-10-CM | POA: Diagnosis not present

## 2017-06-30 DIAGNOSIS — J690 Pneumonitis due to inhalation of food and vomit: Secondary | ICD-10-CM | POA: Diagnosis not present

## 2017-06-30 DIAGNOSIS — I8310 Varicose veins of unspecified lower extremity with inflammation: Secondary | ICD-10-CM | POA: Diagnosis not present

## 2017-06-30 DIAGNOSIS — E440 Moderate protein-calorie malnutrition: Secondary | ICD-10-CM | POA: Diagnosis present

## 2017-06-30 DIAGNOSIS — R112 Nausea with vomiting, unspecified: Secondary | ICD-10-CM

## 2017-06-30 DIAGNOSIS — F329 Major depressive disorder, single episode, unspecified: Secondary | ICD-10-CM

## 2017-06-30 DIAGNOSIS — C7931 Secondary malignant neoplasm of brain: Secondary | ICD-10-CM | POA: Diagnosis present

## 2017-06-30 DIAGNOSIS — I6340 Cerebral infarction due to embolism of unspecified cerebral artery: Secondary | ICD-10-CM | POA: Diagnosis not present

## 2017-06-30 DIAGNOSIS — Z452 Encounter for adjustment and management of vascular access device: Secondary | ICD-10-CM | POA: Diagnosis not present

## 2017-06-30 DIAGNOSIS — I469 Cardiac arrest, cause unspecified: Secondary | ICD-10-CM | POA: Diagnosis not present

## 2017-06-30 DIAGNOSIS — C3490 Malignant neoplasm of unspecified part of unspecified bronchus or lung: Secondary | ICD-10-CM

## 2017-06-30 DIAGNOSIS — J9602 Acute respiratory failure with hypercapnia: Secondary | ICD-10-CM | POA: Diagnosis not present

## 2017-06-30 DIAGNOSIS — K219 Gastro-esophageal reflux disease without esophagitis: Secondary | ICD-10-CM | POA: Diagnosis not present

## 2017-06-30 DIAGNOSIS — D61818 Other pancytopenia: Secondary | ICD-10-CM | POA: Diagnosis present

## 2017-06-30 DIAGNOSIS — R609 Edema, unspecified: Secondary | ICD-10-CM | POA: Diagnosis not present

## 2017-06-30 DIAGNOSIS — F32A Depression, unspecified: Secondary | ICD-10-CM | POA: Diagnosis present

## 2017-06-30 DIAGNOSIS — J9621 Acute and chronic respiratory failure with hypoxia: Secondary | ICD-10-CM | POA: Diagnosis not present

## 2017-06-30 DIAGNOSIS — I2119 ST elevation (STEMI) myocardial infarction involving other coronary artery of inferior wall: Secondary | ICD-10-CM | POA: Diagnosis not present

## 2017-06-30 DIAGNOSIS — I468 Cardiac arrest due to other underlying condition: Secondary | ICD-10-CM | POA: Diagnosis not present

## 2017-06-30 DIAGNOSIS — J9622 Acute and chronic respiratory failure with hypercapnia: Secondary | ICD-10-CM | POA: Diagnosis not present

## 2017-06-30 DIAGNOSIS — I83228 Varicose veins of left lower extremity with both ulcer of other part of lower extremity and inflammation: Secondary | ICD-10-CM | POA: Diagnosis present

## 2017-06-30 DIAGNOSIS — C787 Secondary malignant neoplasm of liver and intrahepatic bile duct: Secondary | ICD-10-CM | POA: Diagnosis present

## 2017-06-30 DIAGNOSIS — R0602 Shortness of breath: Secondary | ICD-10-CM | POA: Diagnosis not present

## 2017-06-30 DIAGNOSIS — I4901 Ventricular fibrillation: Secondary | ICD-10-CM | POA: Diagnosis not present

## 2017-06-30 DIAGNOSIS — L97829 Non-pressure chronic ulcer of other part of left lower leg with unspecified severity: Secondary | ICD-10-CM | POA: Diagnosis present

## 2017-06-30 DIAGNOSIS — R1312 Dysphagia, oropharyngeal phase: Secondary | ICD-10-CM | POA: Diagnosis present

## 2017-06-30 DIAGNOSIS — J9601 Acute respiratory failure with hypoxia: Secondary | ICD-10-CM | POA: Diagnosis not present

## 2017-06-30 DIAGNOSIS — J9690 Respiratory failure, unspecified, unspecified whether with hypoxia or hypercapnia: Secondary | ICD-10-CM | POA: Diagnosis not present

## 2017-06-30 DIAGNOSIS — I213 ST elevation (STEMI) myocardial infarction of unspecified site: Secondary | ICD-10-CM | POA: Diagnosis not present

## 2017-06-30 DIAGNOSIS — M7989 Other specified soft tissue disorders: Secondary | ICD-10-CM | POA: Diagnosis not present

## 2017-06-30 HISTORY — DX: Malignant neoplasm of unspecified part of unspecified bronchus or lung: C34.90

## 2017-06-30 LAB — BASIC METABOLIC PANEL
ANION GAP: 7 (ref 5–15)
ANION GAP: 10 (ref 5–15)
ANION GAP: 10 (ref 5–15)
ANION GAP: 9 (ref 5–15)
Anion gap: 10 (ref 5–15)
BUN: 6 mg/dL (ref 6–20)
BUN: 6 mg/dL (ref 6–20)
BUN: 6 mg/dL (ref 6–20)
BUN: 7 mg/dL (ref 6–20)
BUN: 7 mg/dL (ref 6–20)
CALCIUM: 8.2 mg/dL — AB (ref 8.9–10.3)
CALCIUM: 8.3 mg/dL — AB (ref 8.9–10.3)
CALCIUM: 8 mg/dL — AB (ref 8.9–10.3)
CHLORIDE: 92 mmol/L — AB (ref 101–111)
CO2: 22 mmol/L (ref 22–32)
CO2: 20 mmol/L — ABNORMAL LOW (ref 22–32)
CO2: 20 mmol/L — AB (ref 22–32)
CO2: 22 mmol/L (ref 22–32)
CO2: 18 mmol/L — AB (ref 22–32)
CREATININE: 0.46 mg/dL (ref 0.44–1.00)
CREATININE: 0.43 mg/dL — AB (ref 0.44–1.00)
Calcium: 7.9 mg/dL — ABNORMAL LOW (ref 8.9–10.3)
Calcium: 8 mg/dL — ABNORMAL LOW (ref 8.9–10.3)
Chloride: 90 mmol/L — ABNORMAL LOW (ref 101–111)
Chloride: 92 mmol/L — ABNORMAL LOW (ref 101–111)
Chloride: 92 mmol/L — ABNORMAL LOW (ref 101–111)
Chloride: 94 mmol/L — ABNORMAL LOW (ref 101–111)
Creatinine, Ser: 0.42 mg/dL — ABNORMAL LOW (ref 0.44–1.00)
Creatinine, Ser: 0.45 mg/dL (ref 0.44–1.00)
Creatinine, Ser: 0.53 mg/dL (ref 0.44–1.00)
GFR calc Af Amer: >60 mL/min (ref >60)
GFR calc Af Amer: >60 mL/min (ref >60)
GFR calc Af Amer: >60 mL/min (ref >60)
GFR calc non Af Amer: >60 mL/min (ref >60)
GLUCOSE: 105 mg/dL — AB (ref 65–99)
GLUCOSE: 83 mg/dL (ref 65–99)
GLUCOSE: 78 mg/dL (ref 65–99)
GLUCOSE: 96 mg/dL (ref 65–99)
Glucose, Bld: 108 mg/dL — ABNORMAL HIGH (ref 65–99)
Potassium: 3.9 mmol/L (ref 3.5–5.1)
Potassium: 3.8 mmol/L (ref 3.5–5.1)
Potassium: 3.5 mmol/L (ref 3.5–5.1)
Potassium: 3.5 mmol/L (ref 3.5–5.1)
Potassium: 3.4 mmol/L — ABNORMAL LOW (ref 3.5–5.1)
SODIUM: 121 mmol/L — AB (ref 135–145)
Sodium: 120 mmol/L — ABNORMAL LOW (ref 135–145)
Sodium: 122 mmol/L — ABNORMAL LOW (ref 135–145)
Sodium: 123 mmol/L — ABNORMAL LOW (ref 135–145)
Sodium: 122 mmol/L — ABNORMAL LOW (ref 135–145)

## 2017-06-30 LAB — URINALYSIS, ROUTINE W REFLEX MICROSCOPIC
Bilirubin Urine: NEGATIVE
Glucose, UA: NEGATIVE mg/dL
Ketones, ur: 15 mg/dL — AB
Nitrite: NEGATIVE
Protein, ur: NEGATIVE mg/dL
SPECIFIC GRAVITY, URINE: 1.015 (ref 1.005–1.030)
pH: 6 (ref 5.0–8.0)

## 2017-06-30 LAB — OSMOLALITY: OSMOLALITY: 245 mosm/kg — AB (ref 275–295)

## 2017-06-30 LAB — CBC WITH DIFFERENTIAL/PLATELET
BASOS PCT: 0 %
Basophils Absolute: 0 10*3/uL (ref 0.0–0.1)
EOS ABS: 0 10*3/uL (ref 0.0–0.7)
Eosinophils Relative: 0 %
HEMATOCRIT: 28.1 % — AB (ref 36.0–46.0)
HEMOGLOBIN: 9.8 g/dL — AB (ref 12.0–15.0)
Lymphocytes Relative: 5 %
Lymphs Abs: 0.2 10*3/uL — ABNORMAL LOW (ref 0.7–4.0)
MCH: 30.4 pg (ref 26.0–34.0)
MCHC: 34.9 g/dL (ref 30.0–36.0)
MCV: 87.3 fL (ref 78.0–100.0)
MONOS PCT: 3 %
Monocytes Absolute: 0.1 10*3/uL (ref 0.1–1.0)
NEUTROS ABS: 3.6 10*3/uL (ref 1.7–7.7)
NEUTROS PCT: 92 %
Platelets: 161 10*3/uL (ref 150–400)
RBC: 3.22 MIL/uL — AB (ref 3.87–5.11)
RDW: 14.1 % (ref 11.5–15.5)
WBC: 3.9 10*3/uL — AB (ref 4.0–10.5)

## 2017-06-30 LAB — OSMOLALITY, URINE: OSMOLALITY UR: 276 mosm/kg — AB (ref 300–900)

## 2017-06-30 LAB — URINALYSIS, MICROSCOPIC (REFLEX)

## 2017-06-30 LAB — MRSA PCR SCREENING: MRSA BY PCR: POSITIVE — AB

## 2017-06-30 MED ORDER — ONDANSETRON HCL 4 MG/2ML IJ SOLN: 4.0000 mg | Freq: Four times a day (QID) | INTRAMUSCULAR | Status: DC | PRN

## 2017-06-30 MED ORDER — GABAPENTIN 300 MG PO CAPS
900.0000 mg | ORAL_CAPSULE | Freq: Once | ORAL | Status: AC
  Administered 2017-06-30: 900 mg via ORAL
  Filled 2017-06-30 (×2): qty 3

## 2017-06-30 MED ORDER — ONDANSETRON HCL 4 MG PO TABS: 4.0000 mg | ORAL_TABLET | Freq: Four times a day (QID) | ORAL | Status: DC | PRN

## 2017-06-30 MED ORDER — PROCHLORPERAZINE EDISYLATE 5 MG/ML IJ SOLN
10.0000 mg | Freq: Four times a day (QID) | INTRAMUSCULAR | Status: DC | PRN
  Administered 2017-06-30 – 2017-07-03 (×9): 10 mg via INTRAVENOUS
  Filled 2017-06-30 (×10): qty 2

## 2017-06-30 MED ORDER — CHLORHEXIDINE GLUCONATE 0.12 % MT SOLN
15.0000 mL | Freq: Two times a day (BID) | OROMUCOSAL | Status: DC
  Administered 2017-06-30 – 2017-07-21 (×42): 15 mL via OROMUCOSAL
  Filled 2017-06-30 (×26): qty 15

## 2017-06-30 MED ORDER — ONDANSETRON HCL 4 MG/2ML IJ SOLN
2.0000 mg | Freq: Once | INTRAMUSCULAR | Status: AC
  Administered 2017-06-30: 2 mg via INTRAVENOUS

## 2017-06-30 MED ORDER — CHLORHEXIDINE GLUCONATE CLOTH 2 % EX PADS
6.0000 | MEDICATED_PAD | Freq: Every day | CUTANEOUS | Status: DC
  Administered 2017-06-30 – 2017-07-03 (×4): 6 via TOPICAL

## 2017-06-30 MED ORDER — IBUPROFEN 800 MG PO TABS: 800.0000 mg | ORAL_TABLET | Freq: Four times a day (QID) | ORAL | Status: DC | PRN

## 2017-06-30 MED ORDER — IPRATROPIUM-ALBUTEROL 0.5-2.5 (3) MG/3ML IN SOLN
3.0000 mL | RESPIRATORY_TRACT | Status: DC | PRN
  Administered 2017-07-02: 3 mL via RESPIRATORY_TRACT
  Filled 2017-06-30: qty 3

## 2017-06-30 MED ORDER — HYDROCODONE-ACETAMINOPHEN 10-325 MG PO TABS
1.0000 | ORAL_TABLET | Freq: Four times a day (QID) | ORAL | Status: DC | PRN
  Administered 2017-06-30 – 2017-07-03 (×8): 1 via ORAL
  Filled 2017-06-30 (×8): qty 1

## 2017-06-30 MED ORDER — DICLOFENAC SODIUM 1 % TD GEL: 2.0000 g | Freq: Four times a day (QID) | TRANSDERMAL | Status: DC | PRN

## 2017-06-30 MED ORDER — LORATADINE 10 MG PO TABS
10.0000 mg | ORAL_TABLET | Freq: Every day | ORAL | Status: DC
  Administered 2017-06-30 – 2017-07-02 (×3): 10 mg via ORAL
  Filled 2017-06-30 (×3): qty 1

## 2017-06-30 MED ORDER — ISOSORBIDE MONONITRATE ER 30 MG PO TB24
30.0000 mg | ORAL_TABLET | Freq: Every day | ORAL | Status: DC
  Administered 2017-07-01 – 2017-07-02 (×2): 30 mg via ORAL
  Filled 2017-06-30 (×2): qty 1

## 2017-06-30 MED ORDER — LORLATINIB 100 MG PO TABS
100.0000 mg | ORAL_TABLET | Freq: Every day | ORAL | Status: DC
  Administered 2017-06-30 – 2017-07-02 (×3): 100 mg via ORAL

## 2017-06-30 MED ORDER — LORAZEPAM 2 MG/ML IJ SOLN
0.5000 mg | Freq: Once | INTRAMUSCULAR | Status: AC
  Administered 2017-06-30: 0.5 mg via INTRAVENOUS
  Filled 2017-06-30: qty 1

## 2017-06-30 MED ORDER — GABAPENTIN 300 MG PO CAPS
900.0000 mg | ORAL_CAPSULE | Freq: Every day | ORAL | Status: DC
  Administered 2017-06-30 – 2017-07-02 (×3): 900 mg via ORAL
  Filled 2017-06-30 (×3): qty 3

## 2017-06-30 MED ORDER — ACETAMINOPHEN 650 MG RE SUPP: 650.0000 mg | Freq: Four times a day (QID) | RECTAL | Status: DC | PRN

## 2017-06-30 MED ORDER — LORAZEPAM 2 MG/ML IJ SOLN
0.5000 mg | Freq: Four times a day (QID) | INTRAMUSCULAR | Status: DC | PRN
  Administered 2017-06-30 – 2017-07-02 (×4): 0.5 mg via INTRAVENOUS
  Filled 2017-06-30 (×4): qty 1

## 2017-06-30 MED ORDER — ACETAMINOPHEN 325 MG PO TABS: 650.0000 mg | ORAL_TABLET | Freq: Four times a day (QID) | ORAL | Status: DC | PRN

## 2017-06-30 MED ORDER — PANTOPRAZOLE SODIUM 20 MG PO TBEC
20.0000 mg | DELAYED_RELEASE_TABLET | Freq: Every day | ORAL | Status: DC
  Administered 2017-06-30 – 2017-07-02 (×3): 20 mg via ORAL
  Filled 2017-06-30 (×3): qty 1

## 2017-06-30 MED ORDER — LORAZEPAM 1 MG PO TABS
1.0000 mg | ORAL_TABLET | Freq: Every day | ORAL | Status: DC
  Administered 2017-06-30 – 2017-07-02 (×3): 1 mg via ORAL
  Filled 2017-06-30 (×3): qty 1

## 2017-06-30 MED ORDER — ORAL CARE MOUTH RINSE
15.0000 mL | Freq: Two times a day (BID) | OROMUCOSAL | Status: DC
  Administered 2017-06-30 – 2017-07-21 (×40): 15 mL via OROMUCOSAL

## 2017-06-30 MED ORDER — MUPIROCIN 2 % EX OINT
1.0000 "application " | TOPICAL_OINTMENT | Freq: Two times a day (BID) | CUTANEOUS | Status: AC
  Administered 2017-06-30 – 2017-07-04 (×10): 1 via NASAL
  Filled 2017-06-30 (×3): qty 22

## 2017-06-30 MED ORDER — ENOXAPARIN SODIUM 40 MG/0.4ML ~~LOC~~ SOLN
40.0000 mg | SUBCUTANEOUS | Status: DC
  Administered 2017-06-30 – 2017-07-21 (×22): 40 mg via SUBCUTANEOUS
  Filled 2017-06-30 (×22): qty 0.4

## 2017-06-30 MED ORDER — SODIUM CHLORIDE 0.9 % IV SOLN
INTRAVENOUS | Status: DC
  Administered 2017-06-30 – 2017-07-03 (×6): via INTRAVENOUS

## 2017-06-30 NOTE — ED Notes (Signed)
[facility] arrived to transport pt to [facility] 

## 2017-06-30 NOTE — Consult Note (Addendum)
[facility] Nurse wound consult note Reason for Consult: Consult requested for left leg.  Pt has chronic wounds to left anterior and posterior calf related to lipodermatosclerosis.  She is followed by the outpatient wound center and had skin grafts applied in the past.  Pt uses Drawtex dressings to absorb drainage, which are not available in the [place]. She uses Surepress for compression wraps which are also not available. Wound type: Multiple full thickness wounds Measurement: Affected patchy area to anterior leg is 11X7X.2cm.  Posterior location is patchy areas 11X10X.2cm, seperated by narrow skin bridges. Wound bed: yellow and moist, painful to touch Drainage (amount, consistency, odor) Mod amt tan drainage, no odor.  Generalized edema and maceration surrounding the wounds. Dressing procedure/placement/frequency: Bedside nurse to change dressings Q day; instructions have been provided.  Mepitel contact layer to protect wound from adherence of dressing and decrease discomfort with dressing changes. Substitute Aquacel for Drawtex to provide antimicrobial benefits and absorb drainage.  ABD pads and kerlex, then ace wrap for light compression, since Surepress is not available.  Reviewed plan of care with patient and she verbalized understanding.   Please re-consult if further assistance is needed.  Thank-you,  Julien Girt MSN, [place], [place], [place], [place]

## 2017-06-30 NOTE — H&P (Signed)
History and Physical    Jennifer Delgado ZOX:[id number] DOB: 04/26/1950 DOA: 06/29/2017  Referring MD/NP/PA: Dr. Veatrice Kells PCP: Patient, No Pcp Per  Patient coming from: [facility] transfer  Chief Complaint: Nausea and vomiting  I have personally briefly reviewed patient's old medical records in [place]   HPI: Jennifer Delgado is a 67 y.o. female with medical history significant of NSCLC with brain metastasis, ulcerated lipodermatosclerosis of LLE followed by wound care, depression, HTN, chronic pain;  who presented with complaints of intractable nausea and vomiting.  She reports initially the nausea and vomiting symptoms started 5 days ago.  Prior to receiving her CyberKnife treatments for brain metastases on 3/26 and 3/27 at [facility] by Dr. Lyndel Safe she received a liter of normal saline IV fluids, 10 mg of Decadron IV, and 1 mg of Ativan with improvement of nausea and vomiting symptoms.  However, symptoms returned yesterday morning.  She has been unable to keep any significant amount of food or liquids down.  Emesis is nonbloody.  Tried using Compazine without relief of symptoms.  Associated symptoms include headache and mild shortness of breath unchanged.  Denies having any significant fever, dysuria, diarrhea, or complaints of confusion  ED Course: Upon admission into the emergency department patient was vital signs are otherwise noted to be stable and she was noted to be mentating normal Labs revealed WBC 4.2, hemoglobin 10.6, platelets 190, sodium 116, potassium 4, and chloride 86.  Urinalysis revealed trace ketones, moderate hemoglobin, trace leukocytes, negative nitrites, 0-5 RBCs, rare bacteria, and specific gravity of 1.015.  Urine osmolarity was pending.  There was concern for diabetes insipidus vs SIADH.  Patient receives majority of her care at [facility].  Transferred to [facility] was attempted by Dr. Nicholes Stairs with talks with Dr. Harlin Heys, but they are currently on diversion and patient was reportedly  put on waiting list.  Patient was given Zofran, 10 mg of dexamethasone, 1 L bolus of normal saline IV fluids, and then placed on a rate of 636 mL/h.  Patient was accepted to a stepdown bed.     Review of Systems  Constitutional: Positive for malaise/fatigue. Negative for fever.  HENT: Negative for ear discharge.   Eyes: Positive for blurred vision. Negative for photophobia and pain.  Respiratory: Positive for shortness of breath.   Cardiovascular: Positive for leg swelling. Negative for chest pain.  Gastrointestinal: Positive for nausea and vomiting.  Genitourinary: Negative for dysuria and frequency.  Musculoskeletal: Positive for myalgias. Negative for falls.  Neurological: Positive for headaches. Negative for loss of consciousness.  Endo/Heme/Allergies: Negative for polydipsia.  Psychiatric/Behavioral: Negative for substance abuse. The patient is nervous/anxious.     Past Medical History:  Diagnosis Date  . Brain cancer ([place])   . Cancer ([place])    lung  . Cellulitis   . Hypertension   . Liver cancer ([place])   . Renal disorder     Past Surgical History:  Procedure Laterality Date  . BRONCHOSCOPY    . LEG SURGERY    . THORACENTESIS       reports that she has never smoked. She has never used smokeless tobacco. She reports that she drinks alcohol. She reports that she does not use drugs.  Allergies  Allergen Reactions  . Daptomycin Other (See Comments)    Rhyabdomyolosis  . Dilaudid [Hydromorphone] Nausea And Vomiting    No family history on file.  Prior to Admission medications   Medication Sig Start Date End Date Taking? Authorizing Provider  diclofenac sodium (  VOLTAREN) 1 % GEL Apply topically 4 (four) times daily.    [provider]  doxycycline (DORYX) 100 MG EC tablet Take 100 mg by mouth 2 (two) times daily.    [provider]  furosemide (LASIX) 20 MG tablet Take 20 mg by mouth.    [provider]  HYDROcodone-acetaminophen (NORCO)  10-325 MG tablet Take 1 tablet by mouth every 6 (six) hours as needed.    [provider]  Iron Combinations (CHROMAGEN) capsule Take 1 capsule by mouth daily.    [provider]  lisinopril (PRINIVIL,ZESTRIL) 10 MG tablet Take 10 mg by mouth daily.    [provider]  LORazepam (ATIVAN) 0.5 MG tablet Take 0.5 mg by mouth every 8 (eight) hours.    [provider]  potassium chloride SA (K-DUR,KLOR-CON) 20 MEQ tablet Take 20 mEq by mouth 2 (two) times daily.    [provider]  prochlorperazine (COMPAZINE) 10 MG tablet Take 10 mg by mouth every 6 (six) hours as needed for nausea or vomiting.    [provider]  Rivaroxaban 15 & 20 MG TBPK Take as directed on package: Start with one 15mg  tablet by mouth twice a day with food. On Day 22, switch to one 20mg  tablet once a day with food. 11/07/15   Gloriann Loan, PA-C  sertraline (ZOLOFT) 100 MG tablet Take 100 mg by mouth daily.    [provider]    Physical Exam:  Constitutional: Obese female in NAD, calm, comfortable Vitals:   06/30/17 0030 06/30/17 0100 06/30/17 0200 06/30/17 0247  BP: (!) 153/91 (!) 156/89 127/75 140/83  Pulse: 87 91 89 93  Resp: 16 14 15 18   Temp:    98.1 F (36.7 C)  TempSrc:    Oral  SpO2: 96% 95% 95% 98%  Weight:      Height:       Eyes: PERRL, lids and conjunctivae normal ENMT: Mucous membranes are moist. Posterior pharynx clear of any exudate or lesions.  Neck: normal, supple, no masses, no thyromegaly Respiratory: clear to auscultation bilaterally, no wheezing, no crackles. Normal respiratory effort. No accessory muscle use.  Cardiovascular: Regular rate and rhythm, no murmurs / rubs / gallops. No extremity edema. 2+ pedal pulses. No carotid bruits.  Abdomen: no tenderness, no masses palpated. No hepatosplenomegaly. Bowel sounds positive.  Musculoskeletal: no clubbing / cyanosis. No joint deformity upper and lower extremities. Good ROM, no contractures.  Normal muscle tone.  Skin: Left lower extremity wound wrapped. Neurologic: CN 2-12 grossly intact. Sensation intact, DTR normal. Strength 5/5 in all 4.  Mild tremor noted of the right hand. Psychiatric: Normal judgment and insight. Alert and oriented x 3. Normal mood.     Labs on Admission: I have personally reviewed following labs and imaging studies  CBC: Recent Labs  Lab 06/29/17 2320  WBC 4.2  NEUTROABS 3.3  HGB 10.6*  HCT 30.0*  MCV 88.5  PLT 294   Basic Metabolic Panel: Recent Labs  Lab 06/29/17 2320  NA 116*  K 4.0  CL 86*  CO2 19*  GLUCOSE 92  BUN 7  CREATININE 0.58  CALCIUM 8.3*   GFR: Estimated Creatinine Clearance: 84.3 mL/min (by C-G formula based on SCr of 0.58 mg/dL). Liver Function Tests: Recent Labs  Lab 06/29/17 2320  AST 32  ALT 12*  ALKPHOS 106  BILITOT 0.9  PROT 7.4  ALBUMIN 3.5   No results for input(s): LIPASE, AMYLASE in the last 168 hours. No results for  input(s): AMMONIA in the last 168 hours. Coagulation Profile: No results for input(s): INR, PROTIME in the last 168 hours. Cardiac Enzymes: No results for input(s): CKTOTAL, CKMB, CKMBINDEX, TROPONINI in the last 168 hours. BNP (last 3 results) No results for input(s): PROBNP in the last 8760 hours. HbA1C: No results for input(s): HGBA1C in the last 72 hours. CBG: No results for input(s): GLUCAP in the last 168 hours. Lipid Profile: No results for input(s): CHOL, HDL, LDLCALC, TRIG, CHOLHDL, LDLDIRECT in the last 72 hours. Thyroid Function Tests: No results for input(s): TSH, T4TOTAL, FREET4, T3FREE, THYROIDAB in the last 72 hours. Anemia Panel: No results for input(s): VITAMINB12, FOLATE, FERRITIN, TIBC, IRON, RETICCTPCT in the last 72 hours. Urine analysis:    Component Value Date/Time   COLORURINE YELLOW 06/30/2017 0056   APPEARANCEUR CLEAR 06/30/2017 0056   LABSPEC 1.015 06/30/2017 0056   PHURINE 6.0 06/30/2017 0056   GLUCOSEU NEGATIVE 06/30/2017 0056   HGBUR MODERATE  (A) 06/30/2017 0056   BILIRUBINUR NEGATIVE 06/30/2017 0056   KETONESUR 15 (A) 06/30/2017 0056   PROTEINUR NEGATIVE 06/30/2017 0056   NITRITE NEGATIVE 06/30/2017 0056   LEUKOCYTESUR TRACE (A) 06/30/2017 0056   Sepsis Labs: No results found for this or any previous visit (from the past 240 hour(s)).   Radiological Exams on Admission: Ct Head Wo Contrast  Result Date: 06/30/2017 CLINICAL DATA:  67 y/o F; lung cancer with liver and brain metastasis. Post chemotherapy and CyberKnife. Five days of headaches. EXAM: CT HEAD WITHOUT CONTRAST TECHNIQUE: Contiguous axial images were obtained from the base of the skull through the vertex without intravenous contrast. COMPARISON:  None. FINDINGS: Brain: No evidence of acute infarction, hemorrhage, hydrocephalus, extra-axial collection or focal mass effect. Small foci of lucency are present in white matter and in the left frontal cortex without mass effect. Mild diffuse brain parenchymal volume loss. Vascular: Calcific atherosclerosis of carotid siphons. No hyperdense vessel. Skull: Normal. Negative for fracture or focal lesion. Sinuses/Orbits: No acute finding. Other: None. IMPRESSION: 1. No acute intracranial abnormality.  No focal mass effect. 2. Several small areas of lucency in white matter in the left frontal cortex may represent chronic microvascular ischemic changes or possibly treated metastasis. MRI of the brain with and without contrast can better characterize if clinically indicated. 3. Mild brain parenchymal volume loss. Electronically Signed   By: Kristine Garbe M.D.   On: 06/30/2017 02:42    EKG: Independently reviewed.  Sinus rhythm at 83 bpm. QTc documented at 482.  Assessment/Plan Hyponatremia: Acute.  Initial sodium was noted to be as low as 116 on admission.  Patient received approximately 2 L of normal saline IV fluids while at med center prior to transport.  Goal rate of infusion of IV fluids previously calculated at 636 mL/h.  Suspect hypovolemic hyponatremia secondary to nausea and vomiting.   - Admit to stepdown bed - Strict intake and output  - Follow-up urine osmolarity - Will need to continue serial BMPs every 4 hours - Goal increase of sodium no more than 10 mEq in a 24-hour period - Repeat sodium after receiving 2 L normal saline IV fluids noted to be increased by 5 mEq to 121 at 4:33 AM.  Over an approximately 5 hours.  Decreased rate normal saline infusion to 100 mL/h with repeat check at 9 AM. - Adjust IV rate of fluids as needed - Held furosemide and Zoloft  Intractable nausea and vomiting: Acute.  Patient reports having persistent nausea and vomiting unable to keep any significant  amount of food or liquids down.  Patient had initially been given Zofran, 10 mg of Decadron IV, Ativan 0.5 mg IV.  Patient previously noted to have significant QT prolongation with Zofran. - Compazine IV as needed nausea/vomiting - Ativan 0.5 mg x1 dose now  - Continue to monitor QTc - Advance diet as tolerated  Non-small cell lung cancer with metastases: Patient notes metastases to brain, liver, and spine.  Currently, receivin all her other care and treatment at [facility]. - Will need to contact [facility] in a.m.  - continue home pain medication regimen  Normocytic normochromic anemia: Hemoglobin initially noted to be10.6 on admission which appears near her baseline. - Continue to monitor  Ulcerated lipodermatosclerosis of LLE: Patient reports still having a chronic wound for which she is being followed by wound care. - Wound care consult - Continue gabapentin   Depression - Restart Zoloft when medically appropriate  GERD - Restart Protonix when medically appropriate  Will need to complete med reconciliation DVT prophylaxis:  lovenox  Code Status: Full Family Communication: No family present at bedside Disposition Plan: To be determined Consults called: None Admission status: inpatient  Norval Morton MD [facility] Pager [phone]   If 7PM-7AM, please contact night-coverage [email or web address] Password [email or web address]  06/30/2017, 4:11 AM

## 2017-06-30 NOTE — Plan of Care (Signed)
Discussed care with Dr. Randal Buba at [facility]  Ms. Brita Romp is a 67 year old female with pmh of NSCLC with brain metastasis , ulcerated lipodermatosclerosis of LLE, depression, HTN, chronic pain;  who presented with complaints of intractable nausea and vomiting. Patient s/p first treatment CyberKnife for brain metastaseson 3/27 at [facility] by Dr. Lyndel Safe.  Vital signs are otherwise noted to be stable and mentating normal Labs revealed WBC 4.2, hemoglobin 10.6, platelets 190, sodium 116, potassium 4, and chloride 86.  Urinalysis revealed trace ketones, moderate hemoglobin, trace leukocytes, negative nitrites, 0-5 RBCs, rare bacteria, and specific gravity of 1.015.  Urine osmolarity was pending.    Concern for diabetes insipidus vs SIADH.  Patient receives majority of her care at [facility].  Transferred to [facility] was attempted, but they are currently on diversion and recommended trying back later if needed.  Patient normally seen at [facility] patient was given Zofran, 10 mg of dexamethasone, 1 L bolus of normal saline IV fluids, and then placed on a rate of 636 mL/h.

## 2017-06-30 NOTE — Progress Notes (Addendum)
  PROGRESS NOTE  Patient admitted earlier this morning. See H&P. Jennifer Delgado is a 67 y.o. female with medical history significant of NSCLC withbrain metastasis,ulcerated lipodermatosclerosis of LLE followed by wound care, depression, HTN, chronic painwho presented with complaints of intractable nausea and vomiting.  She reports initially the nausea and vomiting symptoms started 5 days ago.  Prior to receiving her CyberKnife treatments for brain metastases on 3/26 and 3/27 at [facility] by Dr. Lyndel Safe, she received a liter of normal saline IV fluids, 10 mg of Decadron IV, and 1 mg of Ativan with improvement of nausea and vomiting symptoms.  However, symptoms returned yesterday morning.  She has been unable to keep any significant amount of food or liquids down. Workup in the emergency department revealed hyponatremia of 116.  She was started on normal saline IV fluids.  EDP discussed with Dr. Krista Blue of oncology at [facility], patient currently on wait list for transfer to [facility] but they are above capacity at this point.  Serum osmol 245  Currently on normal saline IV fluids with improvement of sodium from 116 --> 121.  Continue decreased normal saline rate, trend BMP every 4 hours. Holding furosemide, Zoloft in setting of hyponatremia IV Ativan and IV Compazine as needed for nausea and vomiting.  Dessa Phi, DO [facility] [email or web address] Password [facility] 06/30/2017, 11:41 AM

## 2017-06-30 NOTE — ED Provider Notes (Signed)
[facility] Provider Note   CSN: [id number] Arrival date & time: 06/29/17  2105     History   Chief Complaint Chief Complaint  Patient presents with  . Emesis    HPI Jennifer Delgado is a 67 y.o. female.  The history is provided by the patient.  Emesis   This is a new problem. The current episode started more than 2 days ago. The problem occurs 5 to 10 times per day. The problem has not changed since onset.The emesis has an appearance of stomach contents. There has been no fever. Pertinent negatives include no abdominal pain, no chills, no cough, no fever, no headaches and no URI. Risk factors: chemotherapy and gamma knife on Monday.  Patient started vomiting at least a day prior to her cyber knife on Monday.  She is taking PO chemotherapy.  She has tried her home compazine without relief.  She states her oncologist wants [place] to give her steroids antiemetics and IVF.  She is also on multiple QT prolonging agents and states we must use all medications cautiously.    Past Medical History:  Diagnosis Date  . Brain cancer ([place])   . Cancer ([place])    lung  . Cellulitis   . Hypertension   . Liver cancer ([place])   . Renal disorder     There are no active problems to display for this patient.   Past Surgical History:  Procedure Laterality Date  . BRONCHOSCOPY    . LEG SURGERY    . THORACENTESIS       OB History   None      Home Medications    Prior to Admission medications   Medication Sig Start Date End Date Taking? Authorizing Provider  diclofenac sodium (VOLTAREN) 1 % GEL Apply topically 4 (four) times daily.    [provider]  doxycycline (DORYX) 100 MG EC tablet Take 100 mg by mouth 2 (two) times daily.    [provider]  furosemide (LASIX) 20 MG tablet Take 20 mg by mouth.    [provider]  HYDROcodone-acetaminophen (NORCO) 10-325 MG tablet Take 1 tablet by mouth every 6 (six) hours as needed.    [provider]  Iron Combinations (CHROMAGEN) capsule Take 1 capsule by mouth daily.    [provider]  lisinopril (PRINIVIL,ZESTRIL) 10 MG tablet Take 10 mg by mouth daily.    [provider]  LORazepam (ATIVAN) 0.5 MG tablet Take 0.5 mg by mouth every 8 (eight) hours.    [provider]  potassium chloride SA (K-DUR,KLOR-CON) 20 MEQ tablet Take 20 mEq by mouth 2 (two) times daily.    [provider]  prochlorperazine (COMPAZINE) 10 MG tablet Take 10 mg by mouth every 6 (six) hours as needed for nausea or vomiting.    [provider]  Rivaroxaban 15 & 20 MG TBPK Take as directed on package: Start with one 15mg  tablet by mouth twice a day with food. On Day 22, switch to one 20mg  tablet once a day with food. 11/07/15   Gloriann Loan, PA-C  sertraline (ZOLOFT) 100 MG tablet Take 100 mg by mouth daily.    [provider]    Family History No family history on file.  Social History Social History   Tobacco Use  . Smoking status: Never Smoker  . Smokeless tobacco: Never Used  Substance Use Topics  . Alcohol use: Yes    Comment: occ  . Drug use: No  Allergies   Daptomycin and Dilaudid [hydromorphone]   Review of Systems Review of Systems  Constitutional: Negative for chills and fever.  Respiratory: Negative for cough.   Cardiovascular: Negative for chest pain and leg swelling.  Gastrointestinal: Positive for nausea and vomiting. Negative for abdominal pain and constipation.  Neurological: Positive for tremors. Negative for seizures, facial asymmetry, speech difficulty, weakness and headaches.  All other systems reviewed and are negative.    Physical Exam Updated Vital Signs BP (!) 160/90   Pulse 85   Temp 97.8 F (36.6 C) (Oral)   Resp (!) 22   Ht 5\' 5"  (1.651 m)   Wt 105.1 kg (231 lb 11.3 oz)   SpO2 97%   BMI 38.56 kg/m   Physical Exam  Constitutional: She is oriented to person, place, and time. She appears  well-developed and well-nourished. No distress.  HENT:  Head: Normocephalic and atraumatic.  Mouth/Throat: Oropharynx is clear and moist. No oropharyngeal exudate.  Eyes: Pupils are equal, round, and reactive to light. Conjunctivae and EOM are normal.  Neck: Normal range of motion. Neck supple.  Cardiovascular: Normal rate, regular rhythm, normal heart sounds and intact distal pulses.  Pulmonary/Chest: Effort normal and breath sounds normal. No stridor. She has no wheezes. She has no rales.  Abdominal: Soft. Bowel sounds are normal. She exhibits no mass. There is no tenderness. There is no rebound and no guarding.  Musculoskeletal: Normal range of motion. She exhibits no deformity.  Neurological: She is alert and oriented to person, place, and time. She displays normal reflexes.  Tremor of the right hand  Skin: Skin is warm and dry. Capillary refill takes less than 2 seconds.  Psychiatric: She has a normal mood and affect.     ED Treatments / Results  Labs (all labs ordered are listed, but only abnormal results are displayed) Results for orders placed or performed during the hospital encounter of 06/29/17  CBC with Differential/Platelet  Result Value Ref Range   WBC 4.2 4.0 - 10.5 K/uL   RBC 3.39 (L) 3.87 - 5.11 MIL/uL   Hemoglobin 10.6 (L) 12.0 - 15.0 g/dL   HCT 30.0 (L) 36.0 - 46.0 %   MCV 88.5 78.0 - 100.0 fL   MCH 31.3 26.0 - 34.0 pg   MCHC 35.3 30.0 - 36.0 g/dL   RDW 13.9 11.5 - 15.5 %   Platelets 190 150 - 400 K/uL   Neutrophils Relative % 80 %   Neutro Abs 3.3 1.7 - 7.7 K/uL   Lymphocytes Relative 10 %   Lymphs Abs 0.4 (L) 0.7 - 4.0 K/uL   Monocytes Relative 10 %   Monocytes Absolute 0.4 0.1 - 1.0 K/uL   Eosinophils Relative 0 %   Eosinophils Absolute 0.0 0.0 - 0.7 K/uL   Basophils Relative 0 %   Basophils Absolute 0.0 0.0 - 0.1 K/uL  Comprehensive metabolic panel  Result Value Ref Range   Sodium 116 (LL) 135 - 145 mmol/L   Potassium 4.0 3.5 - 5.1 mmol/L    Chloride 86 (L) 101 - 111 mmol/L   CO2 19 (L) 22 - 32 mmol/L   Glucose, Bld 92 65 - 99 mg/dL   BUN 7 6 - 20 mg/dL   Creatinine, Ser 0.58 0.44 - 1.00 mg/dL   Calcium 8.3 (L) 8.9 - 10.3 mg/dL   Total Protein 7.4 6.5 - 8.1 g/dL   Albumin 3.5 3.5 - 5.0 g/dL   AST 32 15 - 41 U/L   ALT 12 (L)  14 - 54 U/L   Alkaline Phosphatase 106 38 - 126 U/L   Total Bilirubin 0.9 0.3 - 1.2 mg/dL   GFR calc non Af Amer >60 >60 mL/min   GFR calc Af Amer >60 >60 mL/min   Anion gap 11 5 - 15  Urinalysis, Routine w reflex microscopic  Result Value Ref Range   Color, Urine YELLOW YELLOW   APPearance CLEAR CLEAR   Specific Gravity, Urine 1.015 1.005 - 1.030   pH 6.0 5.0 - 8.0   Glucose, UA NEGATIVE NEGATIVE mg/dL   Hgb urine dipstick MODERATE (A) NEGATIVE   Bilirubin Urine NEGATIVE NEGATIVE   Ketones, ur 15 (A) NEGATIVE mg/dL   Protein, ur NEGATIVE NEGATIVE mg/dL   Nitrite NEGATIVE NEGATIVE   Leukocytes, UA TRACE (A) NEGATIVE  Urinalysis, Microscopic (reflex)  Result Value Ref Range   RBC / HPF 0-5 0 - 5 RBC/hpf   WBC, UA 0-5 0 - 5 WBC/hpf   Bacteria, UA RARE (A) NONE SEEN   Squamous Epithelial / LPF 0-5 (A) NONE SEEN   Ct Head Wo Contrast  Result Date: 06/30/2017 CLINICAL DATA:  67 y/o F; lung cancer with liver and brain metastasis. Post chemotherapy and CyberKnife. Five days of headaches. EXAM: CT HEAD WITHOUT CONTRAST TECHNIQUE: Contiguous axial images were obtained from the base of the skull through the vertex without intravenous contrast. COMPARISON:  None. FINDINGS: Brain: No evidence of acute infarction, hemorrhage, hydrocephalus, extra-axial collection or focal mass effect. Small foci of lucency are present in white matter and in the left frontal cortex without mass effect. Mild diffuse brain parenchymal volume loss. Vascular: Calcific atherosclerosis of carotid siphons. No hyperdense vessel. Skull: Normal. Negative for fracture or focal lesion. Sinuses/Orbits: No acute finding. Other: None.  IMPRESSION: 1. No acute intracranial abnormality.  No focal mass effect. 2. Several small areas of lucency in white matter in the left frontal cortex may represent chronic microvascular ischemic changes or possibly treated metastasis. MRI of the brain with and without contrast can better characterize if clinically indicated. 3. Mild brain parenchymal volume loss. Electronically Signed   By: Kristine Garbe M.D.   On: 06/30/2017 02:42    EKG EKG Interpretation  Date/Time:  Thursday June 29 2017 23:26:23 EDT Ventricular Rate:  81 PR Interval:    QRS Duration: 113 QT Interval:  398 QTC Calculation: 462 R Axis:   -24 Text Interpretation:  Sinus rhythm Borderline intraventricular conduction delay Low voltage, precordial leads Confirmed by Randal Buba, Cono Gebhard ([id number]) on 06/29/2017 11:33:07 PM   Radiology No results found.  Procedures Procedures (including critical care time)  Medications Ordered in ED Medications  ondansetron (ZOFRAN) injection 4 mg (4 mg Intravenous Not Given 06/30/17 0008)  0.9 %  sodium chloride infusion ( Intravenous New Bag/Given 06/30/17 0020)  sodium chloride 0.9 % bolus 1,000 mL (0 mLs Intravenous Stopped 06/29/17 2346)  dexamethasone (DECADRON) injection 10 mg (10 mg Intravenous Given 06/29/17 2331)  ondansetron (ZOFRAN) injection 2 mg (2 mg Intravenous Given 06/30/17 0018)    MDM Reviewed: nursing note, previous chart and vitals Reviewed previous: labs Interpretation: labs, ECG and CT scan (severe hyponatremia at 116 correction rate with NSS calculated at 636 cc/hr) Total time providing critical care: 30-74 minutes. This excludes time spent performing separately reportable procedures and services. Consults: admitting MD (oncology at [facility])  [place] Performed by: Carlisle Beers Total critical care time: 60 minutes Critical care time was exclusive of separately billable procedures and treating other patients. Critical care was necessary to  treat  or prevent imminent or life-threatening deterioration. Critical care was time spent personally by me on the following activities: development of treatment plan with patient and/or surrogate as well as nursing, discussions with consultants, evaluation of patient's response to treatment, examination of patient, obtaining history from patient or surrogate, ordering and performing treatments and interventions, ordering and review of laboratory studies, ordering and review of radiographic studies, pulse oximetry and re-evaluation of patient's condition.  1252 Case d/w Dr. Krista Blue of oncology at [facility], they will place on wait list but they are above capacity   Final Clinical Impressions(s) / ED Diagnoses   Considerations: 1. SIADH secondary to non small cell lung CA as patient clinically appears euvolemic 2.  Central diabetes insipidus as patient has brain mets and has had cyber knife.   3. Vomiting alone, I do not favor this as the potassium and BUN are normal.    Will admit for continued sodium correction and further management and treatment of Na.     Aspasia Rude, MD 06/30/17 [phone]

## 2017-07-01 ENCOUNTER — Encounter (HOSPITAL_COMMUNITY): Payer: Self-pay | Admitting: Certified Registered"

## 2017-07-01 LAB — CBC
HCT: 27.3 % — ABNORMAL LOW (ref 36.0–46.0)
HEMOGLOBIN: 9.3 g/dL — AB (ref 12.0–15.0)
MCH: 30.3 pg (ref 26.0–34.0)
MCHC: 34.1 g/dL (ref 30.0–36.0)
MCV: 88.9 fL (ref 78.0–100.0)
PLATELETS: 146 10*3/uL — AB (ref 150–400)
RBC: 3.07 MIL/uL — ABNORMAL LOW (ref 3.87–5.11)
RDW: 14.2 % (ref 11.5–15.5)
WBC: 3.8 10*3/uL — ABNORMAL LOW (ref 4.0–10.5)

## 2017-07-01 LAB — BASIC METABOLIC PANEL
ANION GAP: 8 (ref 5–15)
ANION GAP: 11 (ref 5–15)
Anion gap: 7 (ref 5–15)
Anion gap: 9 (ref 5–15)
BUN: 6 mg/dL (ref 6–20)
BUN: 5 mg/dL — AB (ref 6–20)
BUN: 5 mg/dL — AB (ref 6–20)
BUN: 5 mg/dL — AB (ref 6–20)
CALCIUM: 7.9 mg/dL — AB (ref 8.9–10.3)
CALCIUM: 8.2 mg/dL — AB (ref 8.9–10.3)
CALCIUM: 8.4 mg/dL — AB (ref 8.9–10.3)
CO2: 21 mmol/L — AB (ref 22–32)
CO2: 20 mmol/L — AB (ref 22–32)
CO2: 21 mmol/L — AB (ref 22–32)
CO2: 18 mmol/L — AB (ref 22–32)
Calcium: 8.2 mg/dL — ABNORMAL LOW (ref 8.9–10.3)
Chloride: 94 mmol/L — ABNORMAL LOW (ref 101–111)
Chloride: 94 mmol/L — ABNORMAL LOW (ref 101–111)
Chloride: 96 mmol/L — ABNORMAL LOW (ref 101–111)
Chloride: 94 mmol/L — ABNORMAL LOW (ref 101–111)
Creatinine, Ser: 0.5 mg/dL (ref 0.44–1.00)
Creatinine, Ser: 0.62 mg/dL (ref 0.44–1.00)
Creatinine, Ser: 0.5 mg/dL (ref 0.44–1.00)
Creatinine, Ser: 0.62 mg/dL (ref 0.44–1.00)
GFR calc Af Amer: >60 mL/min (ref >60)
GFR calc Af Amer: >60 mL/min (ref >60)
GFR calc Af Amer: >60 mL/min (ref >60)
GFR calc Af Amer: >60 mL/min (ref >60)
GLUCOSE: 75 mg/dL (ref 65–99)
GLUCOSE: 82 mg/dL (ref 65–99)
GLUCOSE: 88 mg/dL (ref 65–99)
GLUCOSE: 96 mg/dL (ref 65–99)
POTASSIUM: 3.7 mmol/L (ref 3.5–5.1)
Potassium: 3.3 mmol/L — ABNORMAL LOW (ref 3.5–5.1)
Potassium: 3.3 mmol/L — ABNORMAL LOW (ref 3.5–5.1)
Potassium: 4 mmol/L (ref 3.5–5.1)
Sodium: 122 mmol/L — ABNORMAL LOW (ref 135–145)
Sodium: 123 mmol/L — ABNORMAL LOW (ref 135–145)
Sodium: 125 mmol/L — ABNORMAL LOW (ref 135–145)
Sodium: 123 mmol/L — ABNORMAL LOW (ref 135–145)

## 2017-07-01 MED ORDER — POTASSIUM CHLORIDE CRYS ER 20 MEQ PO TBCR
40.0000 meq | EXTENDED_RELEASE_TABLET | Freq: Once | ORAL | Status: AC
Start: 2017-07-01 — End: 2017-07-01
  Administered 2017-07-01: 40 meq via ORAL
  Filled 2017-07-01: qty 2

## 2017-07-01 NOTE — Progress Notes (Signed)
PROGRESS NOTE    Jennifer Delgado  TFT:[id number] DOB: 1951/01/08 DOA: 06/29/2017 PCP: Patient, No Pcp Per     Brief Narrative:  Jennifer Delgado a 67 y.o.femalewith medical history significant of NSCLC withbrain metastasis,ulcerated lipodermatosclerosis of LLE followed by wound care, depression, HTN, chronic painwho presented with complaints of intractable nausea and vomiting. She reports initially the nausea and vomiting symptoms started 5 days ago. Prior to receiving her CyberKnife treatments for brain metastases on 3/26 and 3/27 at [facility] by Dr. Lyndel Safe, she received a liter of normal saline IV fluids, 10 mg of Decadron IV, and 1 mg of Ativan with improvement of nausea and vomiting symptoms. However, symptoms returned yesterday morning. She has been unable to keep any significant amount of food or liquids down. Workup in the emergency department revealed hyponatremia of 116.  She was started on normal saline IV fluids.  EDP discussed with Dr. Krista Blue of oncology at [facility], patient currently on wait list for transfer to [facility] but they are above capacity at this point.  Assessment & Plan:   Principal Problem:   Hyponatremia Active Problems:   Intractable nausea and vomiting   Non-small cell lung cancer with metastasis (HCC)   Lipodermatosclerosis of lower extremity due to varicose veins   Depression   GERD (gastroesophageal reflux disease)   Hyponatremia,  -Serum osmol 245 -Hypovolemic due to intractable nausea, vomiting -Improved with IVF, will increase rate today -Continue trend BMP q6h -Hold lasix, zoloft in setting of hyponatremia  Nausea, vomiting -Improved -Continue IV ativan, IV compazine as needed  -Advance diet   Hypokalemia -Replace, trend   Non-small cell lung cancer with metastases -Follows with [facility] -Pancytopenia -Monitor   CAD -Continue imdur  GERD -Continue PPI   Ulcerated lipodermatosclerosis of LLE -Wound RN consulted -Continue gabapentin   DVT  prophylaxis: Lovenox Code Status: Full Family Communication: Sister at bedside Disposition Plan: Pending improvement in sodium level, discharge home hopefully 3/31   Consultants:   None  Procedures:   None  Antimicrobials:  Anti-infectives (From admission, onward)   None        Subjective: Feeling better today. Sitting in chair. Continues to require IV antiemetic "to stay on top of nausea" but was able to tolerate full liquid diet this morning with no vomiting. Had loose stool this morning.   Objective: Vitals:   07/01/17 0800 07/01/17 0900 07/01/17 1000 07/01/17 1100  BP: 111/68 119/67 138/62   Pulse:      Resp: 14 16 16  (!) 21  Temp: 98.4 F (36.9 C)     TempSrc: Oral     SpO2: 96% 96% 96% 96%  Weight:      Height:        Intake/Output Summary (Last 24 hours) at 07/01/2017 1256 Last data filed at 07/01/2017 1100 Gross per 24 hour  Intake 2550.83 ml  Output -  Net 2550.83 ml   Filed Weights   06/29/17 2359 06/30/17 0400 06/30/17 0427  Weight: 105.1 kg (231 lb 11.3 oz) 105.1 kg (231 lb 11.3 oz) 105.1 kg (231 lb 11.3 oz)    Examination:  General exam: Appears calm and comfortable  Respiratory system: Clear to auscultation. Respiratory effort normal. Cardiovascular system: S1 & S2 heard, RRR. No JVD, murmurs, rubs, gallops or clicks. No pedal edema. Gastrointestinal system: Abdomen is nondistended, soft and nontender. No organomegaly or masses felt. Normal bowel sounds heard. Central nervous system: Alert and oriented. No focal neurological deficits. Extremities: Symmetric 5 x 5 power Psychiatry: Judgement and insight appear  normal. Mood & affect appropriate.   Data Reviewed: I have personally reviewed following labs and imaging studies  CBC: Recent Labs  Lab 06/29/17 2320 06/30/17 0433 07/01/17 0333  WBC 4.2 3.9* 3.8*  NEUTROABS 3.3 3.6  --   HGB 10.6* 9.8* 9.3*  HCT 30.0* 28.1* 27.3*  MCV 88.5 87.3 88.9  PLT 190 161 130*   Basic Metabolic  Panel: Recent Labs  Lab 06/30/17 1223 06/30/17 1604 06/30/17 1947 07/01/17 0400 07/01/17 0953  NA 122* 123* 122* 122* 123*  K 3.5 3.5 3.4* 3.3* 3.3*  CL 92* 92* 94* 94* 94*  CO2 20* 22 18* 21* 20*  GLUCOSE 83 78 96 75 82  BUN 6 7 7 6  5*  CREATININE 0.43* 0.45 0.53 0.50 0.62  CALCIUM 8.0* 8.3* 8.0* 7.9* 8.2*   GFR: Estimated Creatinine Clearance: 84.3 mL/min (by C-G formula based on SCr of 0.62 mg/dL). Liver Function Tests: Recent Labs  Lab 06/29/17 2320  AST 32  ALT 12*  ALKPHOS 106  BILITOT 0.9  PROT 7.4  ALBUMIN 3.5   No results for input(s): LIPASE, AMYLASE in the last 168 hours. No results for input(s): AMMONIA in the last 168 hours. Coagulation Profile: No results for input(s): INR, PROTIME in the last 168 hours. Cardiac Enzymes: No results for input(s): CKTOTAL, CKMB, CKMBINDEX, TROPONINI in the last 168 hours. BNP (last 3 results) No results for input(s): PROBNP in the last 8760 hours. HbA1C: No results for input(s): HGBA1C in the last 72 hours. CBG: No results for input(s): GLUCAP in the last 168 hours. Lipid Profile: No results for input(s): CHOL, HDL, LDLCALC, TRIG, CHOLHDL, LDLDIRECT in the last 72 hours. Thyroid Function Tests: No results for input(s): TSH, T4TOTAL, FREET4, T3FREE, THYROIDAB in the last 72 hours. Anemia Panel: No results for input(s): VITAMINB12, FOLATE, FERRITIN, TIBC, IRON, RETICCTPCT in the last 72 hours. Sepsis Labs: No results for input(s): PROCALCITON, LATICACIDVEN in the last 168 hours.  Recent Results (from the past 240 hour(s))  MRSA PCR Screening     Status: Abnormal   Collection Time: 06/30/17  5:48 AM  Result Value Ref Range Status   MRSA by PCR POSITIVE (A) NEGATIVE Final    Comment:        The GeneXpert MRSA Assay (FDA approved for NASAL specimens only), is one component of a comprehensive MRSA colonization surveillance program. It is not intended to diagnose MRSA infection nor to guide or monitor treatment  for MRSA infections. RESULT CALLED TO, READ BACK BY AND VERIFIED WITH: BUSICK,C RN @0927  ON 3.29.19 BY [facility] Performed at [facility], [place] [address] [place], [place] [address]        Radiology Studies: Ct Head Wo Contrast  Result Date: 06/30/2017 CLINICAL DATA:  67 y/o F; lung cancer with liver and brain metastasis. Post chemotherapy and CyberKnife. Five days of headaches. EXAM: CT HEAD WITHOUT CONTRAST TECHNIQUE: Contiguous axial images were obtained from the base of the skull through the vertex without intravenous contrast. COMPARISON:  None. FINDINGS: Brain: No evidence of acute infarction, hemorrhage, hydrocephalus, extra-axial collection or focal mass effect. Small foci of lucency are present in white matter and in the left frontal cortex without mass effect. Mild diffuse brain parenchymal volume loss. Vascular: Calcific atherosclerosis of carotid siphons. No hyperdense vessel. Skull: Normal. Negative for fracture or focal lesion. Sinuses/Orbits: No acute finding. Other: None. IMPRESSION: 1. No acute intracranial abnormality.  No focal mass effect. 2. Several small areas of lucency in white matter in the  left frontal cortex may represent chronic microvascular ischemic changes or possibly treated metastasis. MRI of the brain with and without contrast can better characterize if clinically indicated. 3. Mild brain parenchymal volume loss. Electronically Signed   By: Kristine Garbe M.D.   On: 06/30/2017 02:42      Scheduled Meds: . chlorhexidine  15 mL Mouth Rinse BID  . Chlorhexidine Gluconate Cloth  6 each Topical Q0600  . enoxaparin (LOVENOX) injection  40 mg Subcutaneous Q24H  . gabapentin  900 mg Oral QHS  . isosorbide mononitrate  30 mg Oral Daily  . loratadine  10 mg Oral Daily  . LORazepam  1 mg Oral QHS  . Lorlatinib  100 mg Oral Daily  . mouth rinse  15 mL Mouth Rinse q12n4p  . mupirocin ointment  1 application Nasal BID  . pantoprazole  20 mg  Oral QHS   Continuous Infusions: . sodium chloride 150 mL/hr at 07/01/17 0959     LOS: 1 day    Time spent: 20 minutes   Dessa Phi, DO [facility] [email or web address] Password [facility] 07/01/2017, 12:56 PM

## 2017-07-02 LAB — BASIC METABOLIC PANEL
ANION GAP: 7 (ref 5–15)
ANION GAP: 8 (ref 5–15)
ANION GAP: 9 (ref 5–15)
BUN: 5 mg/dL — ABNORMAL LOW (ref 6–20)
BUN: <5 mg/dL — ABNORMAL LOW (ref 6–20)
BUN: <5 mg/dL — ABNORMAL LOW (ref 6–20)
CHLORIDE: 98 mmol/L — AB (ref 101–111)
CO2: 19 mmol/L — AB (ref 22–32)
CO2: 22 mmol/L (ref 22–32)
CO2: 21 mmol/L — ABNORMAL LOW (ref 22–32)
CREATININE: 0.45 mg/dL (ref 0.44–1.00)
CREATININE: 0.58 mg/dL (ref 0.44–1.00)
CREATININE: 0.54 mg/dL (ref 0.44–1.00)
Calcium: 8 mg/dL — ABNORMAL LOW (ref 8.9–10.3)
Calcium: 8.6 mg/dL — ABNORMAL LOW (ref 8.9–10.3)
Calcium: 8.4 mg/dL — ABNORMAL LOW (ref 8.9–10.3)
Chloride: 96 mmol/L — ABNORMAL LOW (ref 101–111)
Chloride: 96 mmol/L — ABNORMAL LOW (ref 101–111)
GFR calc Af Amer: >60 mL/min (ref >60)
GFR calc non Af Amer: >60 mL/min (ref >60)
GFR calc non Af Amer: >60 mL/min (ref >60)
GFR calc non Af Amer: >60 mL/min (ref >60)
Glucose, Bld: 94 mg/dL (ref 65–99)
Glucose, Bld: 86 mg/dL (ref 65–99)
Glucose, Bld: 95 mg/dL (ref 65–99)
POTASSIUM: 3.6 mmol/L (ref 3.5–5.1)
POTASSIUM: 3.5 mmol/L (ref 3.5–5.1)
Potassium: 3.4 mmol/L — ABNORMAL LOW (ref 3.5–5.1)
SODIUM: 124 mmol/L — AB (ref 135–145)
SODIUM: 126 mmol/L — AB (ref 135–145)
SODIUM: 126 mmol/L — AB (ref 135–145)

## 2017-07-02 LAB — CBC
HEMATOCRIT: 27.9 % — AB (ref 36.0–46.0)
HEMOGLOBIN: 9.5 g/dL — AB (ref 12.0–15.0)
MCH: 30.6 pg (ref 26.0–34.0)
MCHC: 34.1 g/dL (ref 30.0–36.0)
MCV: 90 fL (ref 78.0–100.0)
Platelets: 142 10*3/uL — ABNORMAL LOW (ref 150–400)
RBC: 3.1 MIL/uL — AB (ref 3.87–5.11)
RDW: 14.3 % (ref 11.5–15.5)
WBC: 5.4 10*3/uL (ref 4.0–10.5)

## 2017-07-02 NOTE — Progress Notes (Signed)
PROGRESS NOTE    Jhordan Kinter  YQM:[id number] DOB: Dec 04, 1950 DOA: 06/29/2017 PCP: Patient, No Pcp Per     Brief Narrative:  Gay Filler a 67 y.o.femalewith medical history significant of NSCLC withbrain metastasis,ulcerated lipodermatosclerosis of LLE followed by wound care, depression, HTN, chronic painwho presented with complaints of intractable nausea and vomiting. She reports initially the nausea and vomiting symptoms started 5 days ago. Prior to receiving her CyberKnife treatments for brain metastases on 3/26 and 3/27 at [facility] by Dr. Lyndel Safe, she received a liter of normal saline IV fluids, 10 mg of Decadron IV, and 1 mg of Ativan with improvement of nausea and vomiting symptoms. However, symptoms returned yesterday morning. She has been unable to keep any significant amount of food or liquids down. Workup in the emergency department revealed hyponatremia of 116.  She was started on normal saline IV fluids.  EDP discussed with Dr. Krista Blue of oncology at [facility], patient currently on wait list for transfer to [facility] but they are above capacity at this point.  Assessment & Plan:   Principal Problem:   Hyponatremia Active Problems:   Intractable nausea and vomiting   Non-small cell lung cancer with metastasis (HCC)   Lipodermatosclerosis of lower extremity due to varicose veins   Depression   GERD (gastroesophageal reflux disease)   Hyponatremia,  -Serum osmol 245 -Hypovolemic due to intractable nausea, vomiting -Plateaued around mid 120s. Continue IVF  -Continue trend BMP q6h -Hold lasix, zoloft in setting of hyponatremia  Nausea, vomiting -Continue IV ativan, IV compazine as needed  -Advanced diet to soft diet -Had more episodes of nausea and abdominal cramping yesterday   Non-small cell lung cancer with metastases -Follows with [facility] -Pancytopenia -Monitor   CAD -Continue imdur  GERD -Continue PPI   Ulcerated lipodermatosclerosis of LLE -Wound RN  consulted -Continue gabapentin   DVT prophylaxis: Lovenox Code Status: Full Family Communication: No family at bedside Disposition Plan: Pending improvement in sodium level, discharge home next 1-2 days    Consultants:   None  Procedures:   None  Antimicrobials:  Anti-infectives (From admission, onward)   None       Subjective: Didn't have a good night; had episodes of nausea, abdominal cramping. Thinks mac-and-cheese last night made things worse.   Objective: Vitals:   07/01/17 1100 07/01/17 1617 07/01/17 2028 07/02/17 0457  BP:  139/79 131/82 122/61  Pulse:  81 82 84  Resp: (!) _0 Temp:  98 F (36.7 C) 98.3 F (36.8 C) 97.9 F (36.6 C)  TempSrc:  Oral Oral Oral  SpO2: 96% 100% 100% 96%  Weight:      Height:       No intake or output data in the 24 hours ending 07/02/17 1307 Filed Weights   06/29/17 2359 06/30/17 0400 06/30/17 0427  Weight: 105.1 kg (231 lb 11.3 oz) 105.1 kg (231 lb 11.3 oz) 105.1 kg (231 lb 11.3 oz)   Examination: General exam: Appears calm and comfortable  Respiratory system: Clear to auscultation. Respiratory effort normal. Cardiovascular system: S1 & S2 heard, RRR. No JVD, murmurs, rubs, gallops or clicks. No pedal edema. Gastrointestinal system: Abdomen is nondistended, soft and nontender. No organomegaly or masses felt. Normal bowel sounds heard. Central nervous system: Alert and oriented. No focal neurological deficits. Extremities: Symmetric 5 x 5 power. Skin: No rashes, lesions or ulcers Psychiatry: Judgement and insight appear normal. Mood & affect appropriate.    Data Reviewed: I have personally reviewed following labs and imaging studies  CBC: Recent Labs  Lab 06/29/17 2320 06/30/17 0433 07/01/17 0333 07/02/17 0402  WBC 4.2 3.9* 3.8* 5.4  NEUTROABS 3.3 3.6  --   --   HGB 10.6* 9.8* 9.3* 9.5*  HCT 30.0* 28.1* 27.3* 27.9*  MCV 88.5 87.3 88.9 90.0  PLT 190 161 146* 182*   Basic Metabolic Panel: Recent Labs   Lab 07/01/17 0400 07/01/17 0953 07/01/17 1607 07/01/17 2126 07/02/17 0402  NA 122* 123* 125* 123* 124*  K 3.3* 3.3* 4.0 3.7 3.6  CL 94* 94* 96* 94* 98*  CO2 21* 20* 21* 18* 19*  GLUCOSE 75 82 88 96 94  BUN 6 5* 5* 5* 5*  CREATININE 0.50 0.62 0.50 0.62 0.45  CALCIUM 7.9* 8.2* 8.4* 8.2* 8.0*   GFR: Estimated Creatinine Clearance: 84.3 mL/min (by C-G formula based on SCr of 0.45 mg/dL). Liver Function Tests: Recent Labs  Lab 06/29/17 2320  AST 32  ALT 12*  ALKPHOS 106  BILITOT 0.9  PROT 7.4  ALBUMIN 3.5   No results for input(s): LIPASE, AMYLASE in the last 168 hours. No results for input(s): AMMONIA in the last 168 hours. Coagulation Profile: No results for input(s): INR, PROTIME in the last 168 hours. Cardiac Enzymes: No results for input(s): CKTOTAL, CKMB, CKMBINDEX, TROPONINI in the last 168 hours. BNP (last 3 results) No results for input(s): PROBNP in the last 8760 hours. HbA1C: No results for input(s): HGBA1C in the last 72 hours. CBG: No results for input(s): GLUCAP in the last 168 hours. Lipid Profile: No results for input(s): CHOL, HDL, LDLCALC, TRIG, CHOLHDL, LDLDIRECT in the last 72 hours. Thyroid Function Tests: No results for input(s): TSH, T4TOTAL, FREET4, T3FREE, THYROIDAB in the last 72 hours. Anemia Panel: No results for input(s): VITAMINB12, FOLATE, FERRITIN, TIBC, IRON, RETICCTPCT in the last 72 hours. Sepsis Labs: No results for input(s): PROCALCITON, LATICACIDVEN in the last 168 hours.  Recent Results (from the past 240 hour(s))  MRSA PCR Screening     Status: Abnormal   Collection Time: 06/30/17  5:48 AM  Result Value Ref Range Status   MRSA by PCR POSITIVE (A) NEGATIVE Final    Comment:        The GeneXpert MRSA Assay (FDA approved for NASAL specimens only), is one component of a comprehensive MRSA colonization surveillance program. It is not intended to diagnose MRSA infection nor to guide or monitor treatment for MRSA  infections. RESULT CALLED TO, READ BACK BY AND VERIFIED WITH: BUSICK,C RN _0  ON 3.29.19 BY [facility] Performed at [facility] [place] [address] [place], [place] [address]        Radiology Studies: No results found.    Scheduled Meds: . chlorhexidine  15 mL Mouth Rinse BID  . Chlorhexidine Gluconate Cloth  6 each Topical Q0600  . enoxaparin (LOVENOX) injection  40 mg Subcutaneous Q24H  . gabapentin  900 mg Oral QHS  . isosorbide mononitrate  30 mg Oral Daily  . loratadine  10 mg Oral Daily  . LORazepam  1 mg Oral QHS  . Lorlatinib  100 mg Oral Daily  . mouth rinse  15 mL Mouth Rinse q12n4p  . mupirocin ointment  1 application Nasal BID  . pantoprazole  20 mg Oral QHS   Continuous Infusions: . sodium chloride 150 mL/hr at 07/02/17 0247     LOS: 2 days    Time spent: 20 minutes   Dessa Phi, DO [facility] [email or web address] Password [email or web address] 07/02/2017, 1:07 PM

## 2017-07-03 ENCOUNTER — Inpatient Hospital Stay (HOSPITAL_COMMUNITY): Payer: BLUE CROSS/BLUE SHIELD | Admitting: Certified Registered Nurse Anesthetist

## 2017-07-03 ENCOUNTER — Inpatient Hospital Stay (HOSPITAL_COMMUNITY): Payer: BLUE CROSS/BLUE SHIELD

## 2017-07-03 ENCOUNTER — Inpatient Hospital Stay (HOSPITAL_COMMUNITY)
Admit: 2017-07-03 | Discharge: 2017-07-03 | Disposition: A | Discharge status: Home / Self Care | Payer: BLUE CROSS/BLUE SHIELD | Attending: Pulmonary Disease | Admitting: Pulmonary Disease

## 2017-07-03 DIAGNOSIS — G931 Anoxic brain damage, not elsewhere classified: Secondary | ICD-10-CM

## 2017-07-03 DIAGNOSIS — I509 Heart failure, unspecified: Secondary | ICD-10-CM

## 2017-07-03 DIAGNOSIS — G92 Toxic encephalopathy: Secondary | ICD-10-CM | POA: Insufficient documentation

## 2017-07-03 DIAGNOSIS — G9340 Encephalopathy, unspecified: Secondary | ICD-10-CM

## 2017-07-03 DIAGNOSIS — I469 Cardiac arrest, cause unspecified: Secondary | ICD-10-CM

## 2017-07-03 DIAGNOSIS — R0602 Shortness of breath: Secondary | ICD-10-CM

## 2017-07-03 DIAGNOSIS — Z452 Encounter for adjustment and management of vascular access device: Secondary | ICD-10-CM

## 2017-07-03 DIAGNOSIS — M7989 Other specified soft tissue disorders: Secondary | ICD-10-CM

## 2017-07-03 DIAGNOSIS — M79609 Pain in unspecified limb: Secondary | ICD-10-CM

## 2017-07-03 DIAGNOSIS — G928 Other toxic encephalopathy: Secondary | ICD-10-CM | POA: Insufficient documentation

## 2017-07-03 DIAGNOSIS — E871 Hypo-osmolality and hyponatremia: Principal | ICD-10-CM

## 2017-07-03 DIAGNOSIS — R609 Edema, unspecified: Secondary | ICD-10-CM

## 2017-07-03 LAB — BLOOD GAS, ARTERIAL
ACID-BASE DEFICIT: 13.4 mmol/L — AB (ref 0.0–2.0)
ACID-BASE DEFICIT: 8 mmol/L — AB (ref 0.0–2.0)
ACID-BASE DEFICIT: 4.7 mmol/L — AB (ref 0.0–2.0)
Acid-base deficit: 1.3 mmol/L (ref 0.0–2.0)
BICARBONATE: 17.1 mmol/L — AB (ref 20.0–28.0)
BICARBONATE: 20.4 mmol/L (ref 20.0–28.0)
BICARBONATE: 22.2 mmol/L (ref 20.0–28.0)
Bicarbonate: 23.6 mmol/L (ref 20.0–28.0)
Drawn by: 308601
Drawn by: 51425
Drawn by: 103701
Drawn by: 11249
FIO2: 100
FIO2: 100
FIO2: 80
FIO2: 70
LHR: 20 {breaths}/min
LHR: 30 {breaths}/min
MECHVT: 460 mL
MECHVT: 460 mL
MECHVT: 460 mL
O2 SAT: 99.1 %
O2 Saturation: 80.8 %
O2 Saturation: 94.9 %
O2 Saturation: 95.3 %
PATIENT TEMPERATURE: 97.6
PATIENT TEMPERATURE: 36.8
PATIENT TEMPERATURE: 38.2
PCO2 ART: 45.2 mmHg (ref 32.0–48.0)
PEEP/CPAP: 12 cmH2O
PEEP/CPAP: 12 cmH2O
PEEP: 10 cmH2O
PEEP: 10 cmH2O
PH ART: 7.073 — AB (ref 7.350–7.450)
PO2 ART: 178 mmHg — AB (ref 83.0–108.0)
Patient temperature: 97.8
RATE: 16 resp/min
RATE: 30 resp/min
VT: 460 mL
pCO2 arterial: 60.9 mmHg — ABNORMAL HIGH (ref 32.0–48.0)
pCO2 arterial: 56.3 mmHg — ABNORMAL HIGH (ref 32.0–48.0)
pCO2 arterial: 52.1 mmHg — ABNORMAL HIGH (ref 32.0–48.0)
pH, Arterial: 7.179 — CL (ref 7.350–7.450)
pH, Arterial: 7.25 — ABNORMAL LOW (ref 7.350–7.450)
pH, Arterial: 7.344 — ABNORMAL LOW (ref 7.350–7.450)
pO2, Arterial: 68.1 mmHg — ABNORMAL LOW (ref 83.0–108.0)
pO2, Arterial: 101 mmHg (ref 83.0–108.0)
pO2, Arterial: 89.6 mmHg (ref 83.0–108.0)

## 2017-07-03 LAB — ECHOCARDIOGRAM COMPLETE
Height: 65 in
WEIGHTICAEL: 3707.26 [oz_av]

## 2017-07-03 LAB — BASIC METABOLIC PANEL
Anion gap: 12 (ref 5–15)
BUN: 10 mg/dL (ref 6–20)
CO2: 21 mmol/L — ABNORMAL LOW (ref 22–32)
CREATININE: 0.95 mg/dL (ref 0.44–1.00)
Calcium: 7.4 mg/dL — ABNORMAL LOW (ref 8.9–10.3)
Chloride: 94 mmol/L — ABNORMAL LOW (ref 101–111)
GFR calc Af Amer: >60 mL/min (ref >60)
Glucose, Bld: 154 mg/dL — ABNORMAL HIGH (ref 65–99)
Potassium: 3 mmol/L — ABNORMAL LOW (ref 3.5–5.1)
SODIUM: 127 mmol/L — AB (ref 135–145)

## 2017-07-03 LAB — GLUCOSE, CAPILLARY
GLUCOSE-CAPILLARY: 66 mg/dL (ref 65–99)
GLUCOSE-CAPILLARY: 218 mg/dL — AB (ref 65–99)
GLUCOSE-CAPILLARY: 217 mg/dL — AB (ref 65–99)
GLUCOSE-CAPILLARY: 124 mg/dL — AB (ref 65–99)
GLUCOSE-CAPILLARY: 121 mg/dL — AB (ref 65–99)
Glucose-Capillary: 292 mg/dL — ABNORMAL HIGH (ref 65–99)
Glucose-Capillary: 266 mg/dL — ABNORMAL HIGH (ref 65–99)
Glucose-Capillary: 172 mg/dL — ABNORMAL HIGH (ref 65–99)
Glucose-Capillary: 127 mg/dL — ABNORMAL HIGH (ref 65–99)

## 2017-07-03 LAB — COMPREHENSIVE METABOLIC PANEL
ALT: 93 U/L — ABNORMAL HIGH (ref 14–54)
AST: 212 U/L — ABNORMAL HIGH (ref 15–41)
Albumin: 2.8 g/dL — ABNORMAL LOW (ref 3.5–5.0)
Alkaline Phosphatase: 172 U/L — ABNORMAL HIGH (ref 38–126)
Anion gap: 12 (ref 5–15)
BUN: 5 mg/dL — ABNORMAL LOW (ref 6–20)
CHLORIDE: 97 mmol/L — AB (ref 101–111)
CO2: 17 mmol/L — ABNORMAL LOW (ref 22–32)
Calcium: 8.1 mg/dL — ABNORMAL LOW (ref 8.9–10.3)
Creatinine, Ser: 0.76 mg/dL (ref 0.44–1.00)
Glucose, Bld: 324 mg/dL — ABNORMAL HIGH (ref 65–99)
POTASSIUM: 3.7 mmol/L (ref 3.5–5.1)
Sodium: 126 mmol/L — ABNORMAL LOW (ref 135–145)
TOTAL PROTEIN: 6.7 g/dL (ref 6.5–8.1)
Total Bilirubin: 0.5 mg/dL (ref 0.3–1.2)

## 2017-07-03 LAB — MAGNESIUM
MAGNESIUM: 1.9 mg/dL (ref 1.7–2.4)
MAGNESIUM: 1.8 mg/dL (ref 1.7–2.4)

## 2017-07-03 LAB — CORTISOL: Cortisol, Plasma: 11.4 ug/dL

## 2017-07-03 LAB — PHOSPHORUS: Phosphorus: 5.4 mg/dL — ABNORMAL HIGH (ref 2.5–4.6)

## 2017-07-03 LAB — CBC
HEMATOCRIT: 32.2 % — AB (ref 36.0–46.0)
HEMOGLOBIN: 10.7 g/dL — AB (ref 12.0–15.0)
MCH: 30.7 pg (ref 26.0–34.0)
MCHC: 33.2 g/dL (ref 30.0–36.0)
MCV: 92.3 fL (ref 78.0–100.0)
Platelets: 226 10*3/uL (ref 150–400)
RBC: 3.49 MIL/uL — ABNORMAL LOW (ref 3.87–5.11)
RDW: 14.6 % (ref 11.5–15.5)
WBC: 19.6 10*3/uL — AB (ref 4.0–10.5)

## 2017-07-03 LAB — TROPONIN I
TROPONIN I: 2.39 ng/mL — AB (ref <0.03)
Troponin I: 2.54 ng/mL (ref <0.03)
Troponin I: 4.86 ng/mL (ref <0.03)

## 2017-07-03 LAB — LACTIC ACID, PLASMA
LACTIC ACID, VENOUS: 2.5 mmol/L — AB (ref 0.5–1.9)
Lactic Acid, Venous: 2 mmol/L (ref 0.5–1.9)

## 2017-07-03 LAB — TRIGLYCERIDES
Triglycerides: 238 mg/dL — ABNORMAL HIGH (ref <150)
Triglycerides: 134 mg/dL (ref <150)

## 2017-07-03 LAB — TSH: TSH: 3.942 u[IU]/mL (ref 0.350–4.500)

## 2017-07-03 MED ORDER — ASPIRIN 81 MG PO CHEW
81.0000 mg | CHEWABLE_TABLET | Freq: Every day | ORAL | Status: DC
  Administered 2017-07-03 – 2017-07-14 (×12): 81 mg
  Filled 2017-07-03 (×13): qty 1

## 2017-07-03 MED ORDER — PROPOFOL 1000 MG/100ML IV EMUL
0.0000 ug/kg/min | INTRAVENOUS | Status: DC
  Administered 2017-07-03: 5 ug/kg/min via INTRAVENOUS
  Administered 2017-07-03: 8 ug/kg/min via INTRAVENOUS
  Administered 2017-07-03: 25 ug/kg/min via INTRAVENOUS
  Administered 2017-07-04: 40 ug/kg/min via INTRAVENOUS
  Administered 2017-07-04: 25 ug/kg/min via INTRAVENOUS
  Administered 2017-07-04 (×3): 30 ug/kg/min via INTRAVENOUS
  Administered 2017-07-05 (×4): 50 ug/kg/min via INTRAVENOUS
  Administered 2017-07-05 (×2): 40 ug/kg/min via INTRAVENOUS
  Administered 2017-07-06: 50 ug/kg/min via INTRAVENOUS
  Filled 2017-07-03 (×16): qty 100

## 2017-07-03 MED ORDER — SODIUM CHLORIDE 0.9% FLUSH
10.0000 mL | INTRAVENOUS | Status: DC | PRN
  Administered 2017-07-20: 10 mL
  Filled 2017-07-03: qty 40

## 2017-07-03 MED ORDER — INSULIN ASPART 100 UNIT/ML ~~LOC~~ SOLN
3.0000 [IU] | SUBCUTANEOUS | Status: DC
  Administered 2017-07-03 – 2017-07-11 (×21): 3 [IU] via SUBCUTANEOUS
  Administered 2017-07-11: 6 [IU] via SUBCUTANEOUS
  Administered 2017-07-11 – 2017-07-16 (×10): 3 [IU] via SUBCUTANEOUS

## 2017-07-03 MED ORDER — FUROSEMIDE 10 MG/ML IJ SOLN
60.0000 mg | Freq: Once | INTRAMUSCULAR | Status: AC
  Administered 2017-07-03: 60 mg via INTRAVENOUS
  Filled 2017-07-03: qty 6

## 2017-07-03 MED ORDER — SODIUM CHLORIDE 0.9 % IV SOLN
100.0000 ug/h | INTRAVENOUS | Status: DC
  Filled 2017-07-03: qty 50

## 2017-07-03 MED ORDER — FENTANYL CITRATE (PF) 100 MCG/2ML IJ SOLN
50.0000 ug | INTRAMUSCULAR | Status: DC | PRN
  Administered 2017-07-04 – 2017-07-06 (×8): 50 ug via INTRAVENOUS
  Filled 2017-07-03 (×9): qty 2

## 2017-07-03 MED ORDER — SODIUM CHLORIDE 0.9 % IV SOLN
10.0000 mg/h | INTRAVENOUS | Status: DC
  Administered 2017-07-03: 2 mg/h via INTRAVENOUS
  Filled 2017-07-03: qty 10

## 2017-07-03 MED ORDER — NOREPINEPHRINE 4 MG/250ML-% IV SOLN
0.0000 ug/min | INTRAVENOUS | Status: DC
  Administered 2017-07-03: 34 ug/min via INTRAVENOUS
  Administered 2017-07-03 (×2): 38 ug/min via INTRAVENOUS
  Filled 2017-07-03 (×3): qty 250

## 2017-07-03 MED ORDER — PROPOFOL 1000 MG/100ML IV EMUL
5.0000 ug/kg/min | INTRAVENOUS | Status: DC
  Filled 2017-07-03: qty 100

## 2017-07-03 MED ORDER — MIDAZOLAM HCL 2 MG/2ML IJ SOLN: 1.0000 mg | INTRAMUSCULAR | Status: DC | PRN

## 2017-07-03 MED ORDER — AMIODARONE IV BOLUS ONLY 150 MG/100ML: 150.0000 mg | Freq: Once | INTRAVENOUS | Status: AC

## 2017-07-03 MED ORDER — DOCUSATE SODIUM 50 MG/5ML PO LIQD: 100.0000 mg | Freq: Two times a day (BID) | ORAL | Status: DC | PRN

## 2017-07-03 MED ORDER — BISACODYL 10 MG RE SUPP: 10.0000 mg | Freq: Every day | RECTAL | Status: DC | PRN

## 2017-07-03 MED ORDER — CHLORHEXIDINE GLUCONATE CLOTH 2 % EX PADS
6.0000 | MEDICATED_PAD | Freq: Every day | CUTANEOUS | Status: DC
  Administered 2017-07-05 – 2017-07-14 (×6): 6 via TOPICAL

## 2017-07-03 MED ORDER — AMIODARONE IV BOLUS ONLY 150 MG/100ML
150.0000 mg | Freq: Once | INTRAVENOUS | Status: AC
  Administered 2017-07-03: 150 mg via INTRAVENOUS
  Filled 2017-07-03 (×2): qty 100

## 2017-07-03 MED ORDER — INSULIN ASPART 100 UNIT/ML ~~LOC~~ SOLN
3.0000 [IU] | SUBCUTANEOUS | Status: DC | PRN
  Administered 2017-07-03: 9 [IU] via SUBCUTANEOUS
  Filled 2017-07-03: qty 0.09

## 2017-07-03 MED ORDER — ASPIRIN 300 MG RE SUPP
150.0000 mg | Freq: Every day | RECTAL | Status: DC
  Administered 2017-07-03: 150 mg via RECTAL
  Filled 2017-07-03: qty 1

## 2017-07-03 MED ORDER — SODIUM BICARBONATE 8.4 % IV SOLN
INTRAVENOUS | Status: AC
  Administered 2017-07-03: 04:00:00
  Filled 2017-07-03: qty 100

## 2017-07-03 MED ORDER — FENTANYL CITRATE (PF) 100 MCG/2ML IJ SOLN
50.0000 ug | Freq: Once | INTRAMUSCULAR | Status: AC
  Administered 2017-07-03: 50 ug via INTRAVENOUS

## 2017-07-03 MED ORDER — DEXTROSE 5 % IV SOLN
0.0000 ug/min | INTRAVENOUS | Status: DC
  Administered 2017-07-03: 2 ug/min via INTRAVENOUS
  Filled 2017-07-03 (×2): qty 4

## 2017-07-03 MED ORDER — ACETAMINOPHEN 325 MG PO TABS
650.0000 mg | ORAL_TABLET | Freq: Four times a day (QID) | ORAL | Status: DC | PRN
  Administered 2017-07-07: 650 mg
  Filled 2017-07-03: qty 2

## 2017-07-03 MED ORDER — ACETAMINOPHEN 650 MG RE SUPP: 650.0000 mg | Freq: Four times a day (QID) | RECTAL | Status: DC | PRN

## 2017-07-03 MED ORDER — SUCCINYLCHOLINE CHLORIDE 200 MG/10ML IV SOSY
PREFILLED_SYRINGE | INTRAVENOUS | Status: AC
  Filled 2017-07-03: qty 10

## 2017-07-03 MED ORDER — STERILE WATER FOR INJECTION IV SOLN
INTRAVENOUS | Status: DC
  Administered 2017-07-03 (×2): via INTRAVENOUS
  Filled 2017-07-03 (×3): qty 850

## 2017-07-03 MED ORDER — PANTOPRAZOLE SODIUM 40 MG IV SOLR: 40.0000 mg | Freq: Every day | INTRAVENOUS | Status: DC

## 2017-07-03 MED ORDER — HYDROCORTISONE NA SUCCINATE PF 100 MG IJ SOLR
50.0000 mg | Freq: Four times a day (QID) | INTRAMUSCULAR | Status: DC
Start: 2017-07-03 — End: 2017-07-06
  Administered 2017-07-03 – 2017-07-06 (×12): 50 mg via INTRAVENOUS
  Filled 2017-07-03 (×12): qty 2

## 2017-07-03 MED ORDER — SODIUM CHLORIDE 0.9 % IV SOLN
INTRAVENOUS | Status: DC
  Administered 2017-07-03: 12:00:00 via INTRAVENOUS
  Administered 2017-07-09: 10 mL/h via INTRAVENOUS

## 2017-07-03 MED ORDER — AMIODARONE HCL IN DEXTROSE 360-4.14 MG/200ML-% IV SOLN
30.0000 mg/h | INTRAVENOUS | Status: DC
  Administered 2017-07-03 – 2017-07-07 (×9): 30 mg/h via INTRAVENOUS
  Filled 2017-07-03 (×9): qty 200

## 2017-07-03 MED ORDER — NOREPINEPHRINE BITARTRATE 1 MG/ML IV SOLN
0.0000 ug/min | INTRAVENOUS | Status: DC
  Administered 2017-07-03: 38 ug/min via INTRAVENOUS
  Administered 2017-07-04: 2 ug/min via INTRAVENOUS
  Administered 2017-07-04: 4 ug/min via INTRAVENOUS
  Filled 2017-07-03 (×2): qty 16

## 2017-07-03 MED ORDER — SODIUM CHLORIDE 0.9 % IV SOLN
25.0000 ug/h | INTRAVENOUS | Status: DC
  Administered 2017-07-03: 25 ug/h via INTRAVENOUS
  Filled 2017-07-03: qty 50

## 2017-07-03 MED ORDER — AMIODARONE HCL IN DEXTROSE 360-4.14 MG/200ML-% IV SOLN
60.0000 mg/h | INTRAVENOUS | Status: AC
  Administered 2017-07-03: 60 mg/h via INTRAVENOUS
  Filled 2017-07-03 (×2): qty 200

## 2017-07-03 MED ORDER — MIDAZOLAM BOLUS VIA INFUSION (WITHDRAWAL LIFE SUSTAINING TX)
5.0000 mg | INTRAVENOUS | Status: DC | PRN
  Filled 2017-07-03: qty 20

## 2017-07-03 MED ORDER — FENTANYL BOLUS VIA INFUSION
50.0000 ug | INTRAVENOUS | Status: DC | PRN
  Filled 2017-07-03: qty 200

## 2017-07-03 MED ORDER — SUCCINYLCHOLINE CHLORIDE 20 MG/ML IJ SOLN
INTRAMUSCULAR | Status: DC | PRN
  Administered 2017-07-03: 120 mg via INTRAVENOUS

## 2017-07-03 MED ORDER — VASOPRESSIN 20 UNIT/ML IV SOLN
0.0300 [IU]/min | INTRAVENOUS | Status: DC
Start: 2017-07-03 — End: 2017-07-06
  Administered 2017-07-03 – 2017-07-04 (×2): 0.03 [IU]/min via INTRAVENOUS
  Filled 2017-07-03 (×2): qty 2

## 2017-07-03 MED ORDER — METOPROLOL TARTRATE 5 MG/5ML IV SOLN: 2.5000 mg | INTRAVENOUS | Status: DC | PRN

## 2017-07-03 MED ORDER — SODIUM CHLORIDE 0.9 % IV SOLN: 250.0000 mL | INTRAVENOUS | Status: DC | PRN

## 2017-07-03 MED ORDER — FENTANYL BOLUS VIA INFUSION
25.0000 ug | INTRAVENOUS | Status: DC | PRN
  Filled 2017-07-03: qty 25

## 2017-07-03 MED ORDER — MIDAZOLAM HCL 2 MG/2ML IJ SOLN
1.0000 mg | INTRAMUSCULAR | Status: DC | PRN
  Administered 2017-07-06: 1 mg via INTRAVENOUS
  Filled 2017-07-03: qty 2

## 2017-07-03 MED ORDER — PANTOPRAZOLE SODIUM 40 MG PO PACK
40.0000 mg | PACK | ORAL | Status: DC
  Administered 2017-07-03 – 2017-07-13 (×11): 40 mg
  Filled 2017-07-03 (×12): qty 20

## 2017-07-03 MED ORDER — PIPERACILLIN-TAZOBACTAM 3.375 G IVPB
3.3750 g | Freq: Three times a day (TID) | INTRAVENOUS | Status: DC
  Administered 2017-07-03 – 2017-07-07 (×12): 3.375 g via INTRAVENOUS
  Filled 2017-07-03 (×12): qty 50

## 2017-07-03 NOTE — ED Notes (Signed)
IV access attempted x 3 in bilateral arms lab work obtained but was unable to retain IV access.

## 2017-07-03 NOTE — Progress Notes (Signed)
  Echocardiogram 2D Echocardiogram has been performed.  Janese Radabaugh L Androw 07/03/2017, 8:38 AM

## 2017-07-03 NOTE — Progress Notes (Signed)
Initial Nutrition Assessment  DOCUMENTATION CODES:   Obesity unspecified  INTERVENTION:  If TF within POC/GOC, recommend Vital High Protein @ 55 mL/hr. This regimen + kcal from current Propofol rate will provide 1452 kcal, 115 grams of protein, and 1103 mL free water.    NUTRITION DIAGNOSIS:   Inadequate oral intake related to inability to eat as evidenced by NPO status.  GOAL:   Provide needs based on ASPEN/SCCM guidelines   MONITOR:   Vent status, Weight trends, Labs  REASON FOR ASSESSMENT:   Ventilator  ASSESSMENT:   67 yr old female with PMHx of NSCLS with mets to the brain, spine and liver s/p Gamma knife at [facility] on 3/26 and 3/27 presented to [facility] with nausea and vomiting accompanied by HA and SOB. Admitted to Hospitalist service for hypovolemic hyponatremia.  No family/visitors present at time of RD visit. Pt coded early this AM with CPR x30 minutes and subsequently intubated and transferred to the ICU. She is currently intubated, sedated, with OGT in place. Reviewed abd xray results report which states that "enteric tube extends into the stomach with tip likely located in the distal stomach."  Pt is DNR and initially plan for comfort care, but per discussion with other members of the care team, plan for Neuro consult, head CT, and EEG before any further decisions are made concerning POC/GOC. TF recommendations outlined above should they be needed.  Patient is currently intubated on ventilator support MV: 8.8 L/min Temp (24hrs), Avg:97.7 F (36.5 C), Min:96.1 F (35.6 C), Max:99.2 F (37.3 C) Propofol: 5 ml/hr (132 kcal) BP: 101/53 and MAP: 67  Medications reviewed; 60 mg IV Lasix x1 today, 50 mg Solu-cortef QID, sliding scale Novolog,  Labs reviewed; CBGs: 66, 292, and 266 mg/dL today, Na: 126 mmol/L, K: 3.4 mmol/L, Cl: 96 mmol/L, BUN: <5 mg/dL, Ca: 8.4 mg/dL.  IVF: NS @ 50 mL/hr + 150 mEq sodium bicarb in sterile water @ 50 mL/hr.  Drips: Propofol @ 8 mcg/kg/min,  Fentanyl @ 150 mcg/hr, Levo @ 38 mcg/min, Amiodarone @ 30 mg/hr.     Diet Order:  Diet NPO time specified  EDUCATION NEEDS:   No education needs have been identified at this time  Skin:  Skin Assessment: Skin Integrity Issues: Skin Integrity Issues:: Other (Comment) Other: non-pressure injury to L leg  Last BM:  3/31  Height:   Ht Readings from Last 1 Encounters:  06/30/17 5\' 5"  (1.651 m)    Weight:   Wt Readings from Last 1 Encounters:  06/30/17 231 lb 11.3 oz (105.1 kg)    Ideal Body Weight:  56.82 kg  BMI:  Body mass index is 38.56 kg/m.  Estimated Nutritional Needs:   Kcal:  9476-5465 (11-14 kcal/kg)  Protein:  >/= 114 grams (2 grams/kg IBW)  Fluid:  >/= 1.5 L/day      Jarome Matin, MS, RD, LDN, [facility] Inpatient Clinical Dietitian Pager # [phone] After hours/weekend pager # [phone]

## 2017-07-03 NOTE — Procedures (Signed)
Arterial Catheter Insertion Procedure Note Jennifer Delgado [id number] 1951/01/11  Procedure: Insertion of Arterial Catheter  Indications: Blood pressure monitoring  Procedure Details Consent: Unable to obtain consent because of emergent medical necessity. Time Out: Verified patient identification, verified procedure, site/side was marked, verified correct patient position, special equipment/implants available, medications/allergies/relevent history reviewed, required imaging and test results available.  Performed  Maximum sterile technique was used including antiseptics, cap, gloves, gown, hand hygiene, mask and sheet. Skin prep: Chlorhexidine; local anesthetic administered 20 gauge catheter was inserted into right radial artery using the Seldinger technique.  Evaluation Blood flow good; BP tracing good. Complications: No apparent complications.   Jennifer Delgado 07/03/2017

## 2017-07-03 NOTE — Progress Notes (Signed)
Rate increased to 20 after ABG results.

## 2017-07-03 NOTE — Progress Notes (Signed)
   07/03/17 1200  Clinical Encounter Type  Visited With Family  Visit Type Initial;Psychological support;Spiritual support;Critical Care  Referral From Nurse  Consult/Referral To Chaplain  Spiritual Encounters  Spiritual Needs Emotional;Other (Comment) (Spiritual Care conversation/Support)  Stress Factors  Patient Stress Factors Not reviewed  Family Stress Factors Health changes;Major life changes   I visited with the patient's family per Spiritual Care Consult and referral from the bedside nurse. The patient's family were in the waiting room. They stated that they were anxiously waiting on the results of some tests. They have good support with one another.  No needs were expressed.   Please, contact Spiritual Care for further assistance.   [place] M.Div. , [facility]

## 2017-07-03 NOTE — Consult Note (Signed)
.. ..  Name: Jennifer Delgado MRN: [id number] DOB: [birth year]    ADMISSION DATE:  06/29/2017 CONSULTATION DATE:  07/03/17  REFERRING MD :  Maylene Roes MD   CHIEF COMPLAINT:  S/p cardiac arrest   BRIEF PATIENT DESCRIPTION: 67 yr old female with PMHx NSCLC with mets to the brain now s/p VFib arrest, CPR for 30 mins, ROSC achieved. Intubated.  SIGNIFICANT EVENTS  S/p cardiac arrest  STUDIES:  CXR post intubation ABG 7.068/62/68   HISTORY OF PRESENT ILLNESS:  67 yr old female with PMHx of NSCLS with mets to the brain, spine and liver s/p Gamma knife at [facility] on 3/26 and 3/27 presented to [facility] with nausea and vomiting accompanied by HA and SOB. Admitted to Hospitalist service for Hypovolemic Hyponatremia and started on NS infusion. Per CVC strip pt went into Vfib around 1:48 AM Pt was coded for a total of 30 mins receiving epi pushes, amiodarone bolus Magnesium was ordered. Transferred to ICU. I evaluated patient in the ICU no family at bedside at that time. Pt was minimally responsive did not withdraw from pain had a positive gag and corneal reflex.  PAST MEDICAL HISTORY :   has a past medical history of Brain cancer ([place]), Cancer ([place]), Cellulitis, Hypertension, Liver cancer ([place]), and Renal disorder.  has a past surgical history that includes Thoracentesis; Bronchoscopy; and Leg Surgery. Prior to Admission medications   Medication Sig Start Date End Date Taking? Authorizing Provider  albuterol (PROAIR HFA) 108 (90 Base) MCG/ACT inhaler Inhale 2 puffs into the lungs every 6 (six) hours as needed for wheezing or shortness of breath.  12/25/15 04/20/18 Yes [provider]  cetirizine (ZYRTEC) 10 MG tablet Take 10 mg by mouth at bedtime.   Yes [provider]  diclofenac sodium (VOLTAREN) 1 % GEL Apply 2 g topically 4 (four) times daily as needed (For pain.).    Yes [provider]  furosemide (LASIX) 20 MG tablet Take 20 mg by mouth daily.    Yes [provider]    gabapentin (NEURONTIN) 300 MG capsule Take 900 mg by mouth at bedtime. 04/27/17  Yes [provider]  HYDROcodone-acetaminophen (NORCO) 10-325 MG tablet Take 1 tablet by mouth every 6 (six) hours as needed (For pain.).    Yes [provider]  ibuprofen (ADVIL,MOTRIN) 200 MG tablet Take 800 mg by mouth every 6 (six) hours as needed for headache.   Yes [provider]  isosorbide mononitrate (IMDUR) 30 MG 24 hr tablet Take 30 mg by mouth daily. 06/14/17  Yes [provider]  lisinopril (PRINIVIL,ZESTRIL) 10 MG tablet Take 10 mg by mouth daily.   Yes [provider]  LORazepam (ATIVAN) 0.5 MG tablet Take 1 mg by mouth at bedtime.    Yes [provider]  Lorlatinib 100 MG TABS Take 100 mg by mouth daily. 05/16/17  Yes [provider]  pantoprazole (PROTONIX) 20 MG tablet Take 20 mg by mouth at bedtime. 04/18/17  Yes [provider]  potassium chloride SA (K-DUR,KLOR-CON) 20 MEQ tablet Take 20 mEq by mouth daily.    Yes [provider]  prochlorperazine (COMPAZINE) 10 MG tablet Take 10 mg by mouth every 6 (six) hours as needed for nausea or vomiting.   Yes [provider]  sertraline (ZOLOFT) 100 MG tablet Take 200 mg by mouth daily.    Yes [provider]  triamcinolone cream (KENALOG) 0.1 % Apply 1 application topically 2 (two) times daily as needed (For rash after chemo.).  05/11/17  Yes [provider]   Allergies  Allergen Reactions  . Daptomycin Other (See Comments)    Rhyabdomyolosis  . Dilaudid [Hydromorphone] Nausea And Vomiting    FAMILY HISTORY:  family history is not on file. SOCIAL HISTORY:  reports that she has never smoked. She has never used smokeless tobacco. She reports that she drinks alcohol. She reports that she does not use drugs.  REVIEW OF SYSTEMS:  (unable to obtain due to encephalopathy s/p cardiac arrest) Constitutional: Negative for fever, chills, weight loss,  malaise/fatigue and diaphoresis.  HENT: Negative for hearing loss, ear pain, nosebleeds, congestion, sore throat, neck pain, tinnitus and ear discharge.   Eyes: Negative for blurred vision, double vision, photophobia, pain, discharge and redness.  Respiratory: Negative for cough, hemoptysis, sputum production, shortness of breath, wheezing and stridor.   Cardiovascular: Negative for chest pain, palpitations, orthopnea, claudication, leg swelling and PND.  Gastrointestinal: Negative for heartburn, nausea, vomiting, abdominal pain, diarrhea, constipation, blood in stool and melena.  Genitourinary: Negative for dysuria, urgency, frequency, hematuria and flank pain.  Musculoskeletal: Negative for myalgias, back pain, joint pain and falls.  Skin: Negative for itching and rash.  Neurological: Negative for dizziness, tingling, tremors, sensory change, speech change, focal weakness, seizures, loss of consciousness, weakness and headaches.  Endo/Heme/Allergies: Negative for environmental allergies and polydipsia. Does not bruise/bleed easily.  SUBJECTIVE:   VITAL SIGNS: Temp:  [97.9 F (36.6 C)-99.2 F (37.3 C)] 99.2 F (37.3 C) (03/31 2146) Pulse Rate:  [84-149] 149 (04/01 0240) Resp:  [14-29] 29 (04/01 0240) BP: (122-176)/(58-97) 176/97 (04/01 0240) SpO2:  [87 %-100 %] 87 % (04/01 0240)  PHYSICAL EXAMINATION: General:  S/p code,  Neuro:  + gag, + corneals, sluggish pupils no nystagmus no tracking no response to painful stimuli HEENT:  ETT in position with lots of frothy secretions, OGT Cardiovascular:  S1 and S2 tachy Lungs:  Coarse breath sounds bilaterally  Abdomen:  Distended abdomen soft decr BS Musculoskeletal:  LLE has venous stasis ulcer ( undressed to examine) Skin:  Ulceration of LLE  Recent Labs  Lab 07/02/17 0402 07/02/17 1333 07/02/17 2121  NA 124* 126* 126*  K 3.6 3.5 3.4*  CL 98* 96* 96*  CO2 19* 22 21*  BUN 5* <5* <5*  CREATININE 0.45 0.58 0.54  GLUCOSE 94 86 95     Recent Labs  Lab 06/30/17 0433 07/01/17 0333 07/02/17 0402  HGB 9.8* 9.3* 9.5*  HCT 28.1* 27.3* 27.9*  WBC 3.9* 3.8* 5.4  PLT 161 146* 142*   No results found.  ASSESSMENT / PLAN: NEURO: Acute metabolic/hypoxic encephalopathy Hyponatremia, Metabolic Acidosis and s/p code blue Vfib arrest + gag and corneals no withdrawal to painful stimuli Coded for 30 mins No witnessed seizures Keep pt normothermic On fentanyl ggt for sedation with intermittent versed Ulcerated lipodermatosclerosis of LLE- on Gabapentin  CARDIAC: S/p VFib arrest - 30 mins - pt currently on Amiodarone Acute Coronary Syndrome   EKG shows possible ST elevations Awaiting Troponin Not on Vasopressors at the start of eval CVL placed in LIJ If needed Levophed to keep MAP>17mmHg.   PULMONARY: Acute Hypoxic and Hypercarbic respiratory failure On PRVC PEEP 12 FiO2 100% RR 29 ( 9 over set vent rate) 4.259/56/38/75 Resp and Metabolic Acidosis MVE was already increased Bicarb push given and ggt to be started. Cardiogenic Pulmonary Edema- will start on gentle diuresis CXR confirms ETT position  ID: MRSA positive ? Opacity on CXR Start on Vancomycin and Cefepime IV Blood cx x2  Ulcerated lipodermatosclerosis of LLE  Endocrine: Glucose elevated 324 Hyperosmolar state Start on Insulin Phase 1 glycemic protocol  GI: Insert OGT Keep NPO Start on Prophylaxis   Heme: No signs of active bleeding  If Hgb<8 transfuse PRBCs Type and screen  Has a DVT in the   RENAL Hypovolemic Hyponatremia  Will stop IVF periodically due to Cardiogenic Pulmonary edema  At Baseline Cr GFR >60 Lab Results  Component Value Date   CREATININE 0.54 07/02/2017   CREATININE 0.58 07/02/2017   CREATININE 0.45 07/02/2017  Electrolyte goal K+ 4 Mg 2 Phos 2 Place indwelling foley catheter for the first 24 hrs monitor UOP    I, Dr Seward Carol have personally reviewed patient's available data, including medical  history, events of note, physical examination and test results as part of my evaluation. I have discussed with resident/NP and other care providers such as pharmacist, RN and Elink. The patient is critically ill with multiple organ systems failure and requires high complexity decision making for assessment and support, frequent evaluation and titration of therapies, application of advanced monitoring technologies and extensive interpretation of multiple databases. Critical Care Time devoted to patient care services described in this note is 40 Minutes. This time reflects time of care of this signee Dr Seward Carol. This critical care time does not reflect procedure time, or teaching time or supervisory time of NP but could involve care discussion time    DISPOSITION: ICU CC TIME: 55 MINS PROGNOSIS: POOR CODE STATUS: DNR FAMILY:Husband is Waunita Schooner attempted to reach by phone--> I spoke to the husband and he would like to continue care until he arrives in 30-40 mins.  He does not want her to suffer due to her prognosis but   Signed Dr Seward Carol Pulmonary [facility]   07/03/2017, 2:45 AM

## 2017-07-03 NOTE — ED Provider Notes (Signed)
2:30 AM I was called to the patient's room in hospital due to cardiac arrest On arrival, patient was without pulses and CPR in progress.  CPR Procedure Note I PERSONALLY DIRECTED ANCILLARY STAFF OR/PERFORMED CPR IN AN EFFORT TO REGAIN RETURN OF SPONTANEOUS CIRCULATION IN AN EFFORT TO MAINTAIN NEURO, CARDIAC AND SYSTEMIC PERFUSION  Patient was intubated by the nurse anesthetist. During CPR, patient was given magnesium, epinephrine, amiodarone.  She was defibrillated multiple times. She currently has pulses.  Her care has been transferred to the internal medicine service Dr. Fuller Plan.  I also spoke to the intensive care team to manage patient   Ripley Fraise, MD 07/03/17 [phone]

## 2017-07-03 NOTE — Progress Notes (Signed)
CRITICAL VALUE ALERT  Critical Value:  Trop 2.54  Date & Time Notied:  9532 07/03/2017  Provider Notified: Dr. Gilford Raid  Orders Received/Actions taken: Aspirin suppository ordered

## 2017-07-03 NOTE — Progress Notes (Signed)
Pharmacy Antibiotic Note  Jennifer Delgado is a 67 y.o. female admitted on 06/29/2017 with aspiration pneumonia.  Pharmacy has been consulted for zosyn dosing.  Plan: Zosyn 3.375g IV q8h (4 hour infusion).  - pharmacy to sign off, will intervene if dose adjustment required  Height: 5\' 5"  (165.1 cm) Weight: 231 lb 11.3 oz (105.1 kg) IBW/kg (Calculated) : 57  Temp (24hrs), Avg:97.9 F (36.6 C), Min:96.1 F (35.6 C), Max:99.2 F (37.3 C)  Recent Labs  Lab 06/29/17 2320 06/30/17 0433  07/01/17 0333  07/01/17 2126 07/02/17 0402 07/02/17 1333 07/02/17 2121 07/03/17 0300 07/03/17 0314 07/03/17 0527  WBC 4.2 3.9*  --  3.8*  --   --  5.4  --   --   --   --  19.6*  CREATININE 0.58 0.46   < >  --    < > 0.62 0.45 0.58 0.54 0.76  --   --   LATICACIDVEN  --   --   --   --   --   --   --   --   --   --  2.5* 2.0*   < > = values in this interval not displayed.    Estimated Creatinine Clearance: 84.3 mL/min (by C-G formula based on SCr of 0.76 mg/dL).    Allergies  Allergen Reactions  . Daptomycin Other (See Comments)    Rhyabdomyolosis  . Dilaudid [Hydromorphone] Nausea And Vomiting     Thank you for allowing pharmacy to be a part of this patient's care.  Doreene Eland, PharmD, BCPS.   Pager: [phone] 07/03/2017 11:05 AM

## 2017-07-03 NOTE — Anesthesia Procedure Notes (Signed)
Procedure Name: Intubation Date/Time: 07/03/2017 2:10 AM Performed by: West Pugh, CRNA Pre-anesthesia Checklist: Patient identified, Emergency Drugs available, Suction available, Patient being monitored and Timeout performed Patient Re-evaluated:Patient Re-evaluated prior to induction Oxygen Delivery Method: Ambu bag Preoxygenation: Pre-oxygenation with 100% oxygen Induction Type: IV induction and Cricoid Pressure applied Ventilation: Mask ventilation without difficulty Laryngoscope Size: Glidescope and 3 Grade View: Grade I Tube type: Subglottic suction tube Tube size: 7.5 mm Number of attempts: 2 Airway Equipment and Method: Stylet and Video-laryngoscopy Placement Confirmation: ETT inserted through vocal cords under direct vision,  CO2 detector and breath sounds checked- equal and bilateral Secured at: 22 cm Tube secured with: Tape Dental Injury: Teeth and Oropharynx as per pre-operative assessment  Difficulty Due To: Difficult Airway- due to large tongue and Difficult Airway- due to limited oral opening Comments: DL x 1 attempt during pause of CPR. Grade IV view. Glidescope utilized and placement of ETT successful. Moderate amount of secretions observed in trachea prior to ETT placement. ETT confirmed with video, positive ETCO2 and Bilateral breath sounds.

## 2017-07-03 NOTE — Progress Notes (Signed)
Face-to-face interaction, Ruby RN requested next bag of neosynephrine be concentrated. I have changed it to QUAD strength.   Romeo Rabon, PharmD. Mobile: [phone]. 07/03/2017,10:38 AM.

## 2017-07-03 NOTE — Progress Notes (Signed)
CRITICAL VALUE ALERT  Critical Value:  2.5  Date & Time Notied:  07/03/2017  Provider Notified: scatliffe  Orders Received/Actions taken: Bicarb gtts

## 2017-07-03 NOTE — Progress Notes (Signed)
Preliminary notes by tech--Bilateral lower extremities venous duplex study completed. Negative for deep and superficial veins thrombosis. Severe edema noted on left calf region. Lymph nodes seen on left groin area. Result notified RN.   Hongying Betsabe Iglesia(RDMS RVT) 07/03/17 12:38 PM

## 2017-07-03 NOTE — Progress Notes (Signed)
PULMONARY / CRITICAL CARE MEDICINE   Name: Jennifer Delgado MRN: [id number] DOB: October 27, 1950    ADMISSION DATE:  06/29/2017 CONSULTATION DATE:  07/03/2017  REFERRING MD:  Dr. Maylene Roes, [facility]  CHIEF COMPLAINT:  Cardiac arrest  HISTORY OF PRESENT ILLNESS:   67 yo female with nausea and vomiting.  She has hx of Stage IV NSCLC with brain metastases, ulcerated lipodermatosclerosis.  Found to have hyponatremia from hypovolemia with sodium of 116.  She developed cardiac arrest from VF 4/01, had ROSC after 30 minutes and transferred to ICU.  SUBJECTIVE:  Remains on pressors, sedation, full vent support.  No report of myoclonic activity after cardiac arrest.  Breathing over set rate on vent.  VITAL SIGNS: BP (!) 176/97 (BP Location: Right Arm)   Pulse 83   Temp 98.4 F (36.9 C)   Resp 14   Ht 5\' 5"  (1.651 m)   Wt 231 lb 11.3 oz (105.1 kg)   SpO2 99%   BMI 38.56 kg/m   HEMODYNAMICS: CVP:  [26 mmHg-30 mmHg] 26 mmHg  VENTILATOR SETTINGS: Vent Mode: PRVC FiO2 (%):  [100 %] 100 % Set Rate:  [16 bmp-20 bmp] 20 bmp Vt Set:  [460 mL] 460 mL PEEP:  [12 cmH20] 12 cmH20 Plateau Pressure:  [33 cmH20] 33 cmH20  INTAKE / OUTPUT: I/O last 3 completed shifts: In: 391.7 [I.V.:391.7] Out: 30 [Urine:30]  PHYSICAL EXAMINATION:  General - on vent Eyes - pupils small but weakly reactive b/l ENT - ETT in place Cardiac - regular, no murmur Chest - Rt > Lt rales Abd - soft, non tender Ext - 1+ edema Skin - Lt lower leg in wrap Neuro - RASS -4   LABS:  BMET Recent Labs  Lab 07/02/17 1333 07/02/17 2121 07/03/17 0300  NA 126* 126* 126*  K 3.5 3.4* 3.7  CL 96* 96* 97*  CO2 22 21* 17*  BUN <5* <5* 5*  CREATININE 0.58 0.54 0.76  GLUCOSE 86 95 324*    Electrolytes Recent Labs  Lab 07/02/17 1333 07/02/17 2121 07/03/17 0300 07/03/17 0314  CALCIUM 8.6* 8.4* 8.1*  --   MG  --   --  1.9 1.8  PHOS  --   --   --  5.4*    CBC Recent Labs  Lab 07/01/17 0333 07/02/17 0402 07/03/17 0527   WBC 3.8* 5.4 19.6*  HGB 9.3* 9.5* 10.7*  HCT 27.3* 27.9* 32.2*  PLT 146* 142* 226    Coag's No results for input(s): APTT, INR in the last 168 hours.  Sepsis Markers Recent Labs  Lab 07/03/17 0314 07/03/17 0527  LATICACIDVEN 2.5* 2.0*    ABG Recent Labs  Lab 07/03/17 0254 07/03/17 0558  PHART 7.073* 7.179*  PCO2ART 60.9* 56.3*  PO2ART 68.1* 101    Liver Enzymes Recent Labs  Lab 06/29/17 2320 07/03/17 0300  AST 32 212*  ALT 12* 93*  ALKPHOS 106 172*  BILITOT 0.9 0.5  ALBUMIN 3.5 2.8*    Cardiac Enzymes Recent Labs  Lab 07/03/17 0314  TROPONINI 2.54*  2.39*    Glucose Recent Labs  Lab 07/03/17 0210 07/03/17 0329 07/03/17 0503  GLUCAP 66 292* 266*    Imaging Dg Chest 1 View  Result Date: 07/03/2017 CLINICAL DATA:  68 year old female with central line placement. EXAM: ABDOMEN - 1 VIEW; CHEST  1 VIEW COMPARISON:  Chest radiograph dated 07/03/2017 FINDINGS: Endotracheal tube above the carina in similar position. Enteric tube extends into the stomach with tip likely located in the distal stomach. There  has been interval placement of a left IJ central venous line with tip over central SVC at the level of the right hilum. No pneumothorax. Cardiomegaly with interstitial edema with more confluent airspace opacity in the right lung most likely representing asymmetric pulmonary edema although pneumonia is not excluded. Clinical correlation is recommended. Overall interval worsening of the airspace disease compared to the prior radiograph. Probable small right pleural effusion. No acute osseous pathology. IMPRESSION: 1. Interval placement of a left IJ central line with tip over central SVC. No pneumothorax. Endotracheal tube above the carina and enteric tube likely in the distal stomach. 2. Cardiomegaly with findings of CHF. Pneumonia is not excluded. Clinical correlation is recommended. Electronically Signed   By: Anner Crete M.D.   On: 07/03/2017 05:26   Dg Abd  1 View  Result Date: 07/03/2017 CLINICAL DATA:  67 year old female with central line placement. EXAM: ABDOMEN - 1 VIEW; CHEST  1 VIEW COMPARISON:  Chest radiograph dated 07/03/2017 FINDINGS: Endotracheal tube above the carina in similar position. Enteric tube extends into the stomach with tip likely located in the distal stomach. There has been interval placement of a left IJ central venous line with tip over central SVC at the level of the right hilum. No pneumothorax. Cardiomegaly with interstitial edema with more confluent airspace opacity in the right lung most likely representing asymmetric pulmonary edema although pneumonia is not excluded. Clinical correlation is recommended. Overall interval worsening of the airspace disease compared to the prior radiograph. Probable small right pleural effusion. No acute osseous pathology. IMPRESSION: 1. Interval placement of a left IJ central line with tip over central SVC. No pneumothorax. Endotracheal tube above the carina and enteric tube likely in the distal stomach. 2. Cardiomegaly with findings of CHF. Pneumonia is not excluded. Clinical correlation is recommended. Electronically Signed   By: Anner Crete M.D.   On: 07/03/2017 05:26   Dg Chest Port 1 View  Result Date: 07/03/2017 CLINICAL DATA:  Endotracheal tube placement. Dyspnea and respiratory failure. EXAM: PORTABLE CHEST 1 VIEW COMPARISON:  None. FINDINGS: Portable AP supine view of the chest. Cardiomegaly with interstitial edema. Slightly more confluent airspace opacities in the right upper and lower lung. Endotracheal tube tip is 4.2 cm above the carina in satisfactory position. External defibrillator paddles project over the cardiac silhouette. No acute osseous abnormality. IMPRESSION: 1. Endotracheal tube tip is satisfactory in position approximately 4.2 cm above the carina. 2. Cardiomegaly with interstitial edema. Slightly more confluent airspace opacity in the right upper and lower. Superimposed  CHF or pneumonia may contribute to this appearance. Electronically Signed   By: Ashley Royalty M.D.   On: 07/03/2017 03:19     STUDIES:  Echo 4/01 >> EF 60 to 65%, mod LVH, grade 1 DD, mild RV dilation, mild/mod RV systolic dysfx, small pericardial effusion  CULTURES: Sputum 4/01 >>  ANTIBIOTICS: Zosyn 4/01 >>   SIGNIFICANT EVENTS: 3/28 Admit 4/01 VF cardiac arrest, neuro consulted  LINES/TUBES: ETT 4/01 >> Rt radial aline 4/01 >>  Lt IJ CVL 4/01 >>   DISCUSSION: 67 yo female with VF arrest after admission for nausea and vomiting with hyponatremia.  She has been tx at [facility] for Stage 4 NSCLC (adenocarcinoma).  Family would like to do additional neuro assessment to determine if she has severe anoxic encephalopathy before deciding about transition to comfort measures.  ASSESSMENT / PLAN:  Acute respiratory failure with hypoxia/hypercapnic. - full vent support - Adjust FiO2 down to 60% as able to keep  SpO2 > 92%, then adjust PEEP down - f/u CXR, ABG - prn BDs  Anoxic encephalopathy. - f/u CT head, EEG - neuro consulted - change sedation to diprivan and prn fentanyl - RASS goal 0 to -1  Aspiration pneumonia. - start zosyn  Shock. - cardiac +/- septic - pressors to keep MAP > 65 - check cortisol, and start solu cortef pending results  VF cardiac arrest. Hx of CAD, HTN.. - check doppler legs - continue ASA - continue amiodarone - defer cardiology assessment until neuro status improved  Hypovolemic hyponatremia. - continue NS IV fluid - f/u BMET q8h  Hypomagnesemia. - replace as needed  Ulcerated lipodermatosclerosis Lt leg. - wound care  Hyperglycemia.  - SSI  Anemia of critical illness and chronic disease. - f/u CBC  Stage IV NSCLC with brain metastasis s/p gamma knife at [facility]. - will try to contact oncology team at [facility] to get better idea about prognosis >> pt's husband understanding is that pt has years to live with appropriate therapy  DVT  prophylaxis - Lovenox SUP - protonix Nutrition - NPO Goals of care - DNR  Had detailed discussion with pt's family at bedside  CC time 55 minutes  Chesley Mires, MD [place] 07/03/2017, 10:47 AM Pager:  [phone] After 3pm call: [phone]

## 2017-07-03 NOTE — Significant Event (Addendum)
   FAMILY DISCUSSION    Spoke to husband via phone with RN as witness ME and the Husband Jennifer Delgado spoke earlier tonight about her clinical condition and prognosis He stressed to me that he did not want her to suffer  She was made DNR but he wanted to continue medical care as long as she was responding in a positive manner I called at 6:20 AM and spoke to him and updated him that despite mechanical ventilation and bicarb ggt her acidosis was only mildly improved Despite Levophed at 34 mcg her BP was not improving. It was an interactive discussion where I addressed his questions and concerns Decision made for comfort care. Confirmed care plan with RN at bedside     Signed Dr Seward Carol Pulmonary Critical Care Locums

## 2017-07-03 NOTE — Progress Notes (Signed)
Pt coded on floor. Brought down to ICU. RN's trying to obtain IV access. Amiodarone bolus given, and gtts started. CCT talked with family. Central line placed. Arterial line placed. Levophed started for low BP. Bicarb gtts started. Lasix given. Foley placed. OGT placed - LIS. ABG x2. Pt starting to decompensate. Levophed titrated to 42mcg. Critical care team called. Dr. Gilford Raid talked with husband. Placed on comfort care. Awaiting family from [place].

## 2017-07-03 NOTE — Procedures (Signed)
Central Venous Catheter Insertion Procedure Note Shulamis Wenberg [id number] 31-Oct-1950  Procedure: Insertion of Central Venous Catheter Indications: Assessment of intravascular volume, Drug and/or fluid administration and Frequent blood sampling  Procedure Details Consent: Risks of procedure as well as the alternatives and risks of each were explained to the (patient/caregiver).  Consent for procedure obtained. Time Out: Verified patient identification, verified procedure, site/side was marked, verified correct patient position, special equipment/implants available, medications/allergies/relevent history reviewed, required imaging and test results available.  Performed  Maximum sterile technique was used including antiseptics, cap, gloves, gown, hand hygiene, mask and sheet. Skin prep: Chlorhexidine; local anesthetic administered A antimicrobial bonded/coated triple lumen catheter was placed in the left internal jugular vein using the Seldinger technique.  Evaluation Blood flow good Complications: No apparent complications Patient did tolerate procedure well. Chest X-ray ordered to verify placement.  CXR: pending.  Rise Paganini Scatliffe 07/03/2017, 4:40 AM

## 2017-07-03 NOTE — Progress Notes (Signed)
eLink Physician-Brief Progress Note Patient Name: Jennifer Delgado DOB: May 27, 1950 MRN: [id number]   Date of Service  07/03/2017  HPI/Events of Note  Hypotension - BP = 93/50. Currently on 34 mcg/min of Norepinephrine.   eICU Interventions  Will order: 1. Increase ceiling on Norepinephrine IV infusion to 60 mcg/min.  2. Await pending ABG.      Intervention Category Major Interventions: Hypotension - evaluation and management  Sommer,Steven Eugene 07/03/2017, 6:09 AM

## 2017-07-03 NOTE — Progress Notes (Signed)
EEG Completed; Results Pending  

## 2017-07-03 NOTE — Progress Notes (Signed)
[place] Progress Note Patient Name: Jennifer Delgado DOB: 1950-10-24 MRN: [id number]   Date of Service  07/03/2017  HPI/Events of Note  ABG on 70%/VC 30/TV 460/P 10 = 7.34/45/178/23.6  eICU Interventions  Continue present ventilator management.      Intervention Category Major Interventions: Acid-Base disturbance - evaluation and management;Respiratory failure - evaluation and management  Lysle Dingwall 07/03/2017, 7:44 PM

## 2017-07-03 NOTE — Procedures (Signed)
HPI:  67 y/o s/p CP arrest.  Pt with hx of brain mets.  TECHNICAL SUMMARY:  A multichannel referential and bipolar montage EEG using the standard international 10-20 system was performed on the patient described as sedated on fentanyl, propofol and Versed.  There is no occipital dominant rhythm.  The background activity consists primarily of a low voltage beta activity with occasional 4-5 Hz activity seen intermixed.  This is very low voltage.   ACTIVATION:  Stepwise photic stimulation and hyperventilation were not performed  EPILEPTIFORM ACTIVITY:  There were no spikes, sharp waves or paroxysmal activity.  SLEEP: No physiologic sleep is noted.  IMPRESSION:  This is an abnormal EEG demonstrating diffuse slowing of electrocerebral activity.  This can be seen in a wide variety of encephalopathic states including those of a toxic, metabolic, or degenerative nature.  This could also be a result of sedating medications.  There was no epileptiform activity or asymmetries on this EEG.  Repeat EEG when off of sedating medications could be of value if seizure is a clinical concern.  Correlate clinically.

## 2017-07-03 NOTE — Progress Notes (Deleted)
Nutrition Brief Note  Patient screened for new vent. Chart reviewed. Pt with hx of NSCLC with brain, liver, and spinal mets. She went into arrest over night and CPR done for 30 minutes. Pt was subsequently intubated and transferred to the ICU. Pt now transitioning to comfort care following Dr. Lucile Crater phone conversation with pt's husband.  No nutrition interventions warranted at this time.     Jarome Matin, MS, RD, LDN, [facility] Inpatient Clinical Dietitian Pager # [phone] After hours/weekend pager # [phone]

## 2017-07-03 NOTE — Consult Note (Addendum)
Neurology Consultation Reason for Consult: Prognosis Referring Physician: Governor Rooks  CC: Unresponsiveness  History is obtained from: Family  HPI: Jennifer Delgado is a 67 y.o. female with a history of metastatic non-small cell cancer with brain metastasis who was in the hospital due to hyponatremia and nausea/vomiting who subsequently had a cardiac arrest.  She had a 30-minute long code and Rosc was obtained.  Due to the fact that she is not waking up, neurology has been asked for comments about prognosis.  ROS:  Unable to obtain due to altered mental status.   Past Medical History:  Diagnosis Date  . Brain cancer ([place])   . Cancer ([place])    lung  . Cellulitis   . Hypertension   . Liver cancer ([place])   . Renal disorder      No family history on file.   Social History:  reports that she has never smoked. She has never used smokeless tobacco. She reports that she drinks alcohol. She reports that she does not use drugs.   Exam: Current vital signs: BP (!) 176/97 (BP Location: Right Arm)   Pulse 81   Temp (!) 100.6 F (38.1 C)   Resp (!) 31   Ht 5\' 5"  (1.651 m)   Wt 105.1 kg (231 lb 11.3 oz)   SpO2 97%   BMI 38.56 kg/m  Vital signs in last 24 hours: Temp:  [95.4 F (35.2 C)-100.6 F (38.1 C)] 100.6 F (38.1 C) (04/01 1600) Pulse Rate:  [79-149] 81 (04/01 1600) Resp:  [14-31] 31 (04/01 1600) BP: (136-176)/(63-97) 176/97 (04/01 0240) SpO2:  [85 %-100 %] 97 % (04/01 1600) Arterial Line BP: (67-177)/(39-83) 135/68 (04/01 1600) FiO2 (%):  [80 %-100 %] 80 % (04/01 1319)   Physical Exam  Constitutional: Appears well-developed and well-nourished.  Psych: Affect appropriate to situation Eyes: No scleral injection HENT: Intubated Head: Normocephalic.  Cardiovascular: Normal rate and regular rhythm.  Respiratory: Ventilated  GI: Soft.  No distension. There is no tenderness.  Skin: WDI  Neuro: Mental Status: She does not open eyes or follow commands but she does grimace  to noxious stimuli. Cranial Nerves: II: She does not blink to threat.  Pupils are equal, round, and reactive to light.   III,IV, VI: Eyes are midline. V: VII: Corneals are intact bilaterally X: Cough present Motor: She does not move to noxious stimuli of any extremity Sensory: She does grimace to noxious stimuli in all 4 extremities Cerebellar: She does not perform   I have reviewed labs in epic and the results pertinent to this consultation are: Sodium 126 Glucose 324  I have reviewed the images obtained: CT head from 3/29- known small metastasis  Impression: 67 year old female with coma status post cardiac arrest.  I think that it is too early to say definitively what her likelihood of recovery is.  At this time, ancillary testing with EEG and CT would be helpful.  If she is not making progress in the next few days, then may need to discuss aggressiveness of care given her underlying medical issues.  Recommendations: 1) CT head 2) EEG 3) neurology will follow  Roland Rack, MD [facility] [phone]  If 7pm- 7am, please page neurology on call as listed in [place].

## 2017-07-04 ENCOUNTER — Inpatient Hospital Stay (HOSPITAL_COMMUNITY): Payer: BLUE CROSS/BLUE SHIELD

## 2017-07-04 LAB — BLOOD GAS, ARTERIAL
ACID-BASE EXCESS: 0.3 mmol/L (ref 0.0–2.0)
Bicarbonate: 23.5 mmol/L (ref 20.0–28.0)
Drawn by: 11249
FIO2: 60
MECHVT: 460 mL
O2 Saturation: 99 %
PCO2 ART: 34.2 mmHg (ref 32.0–48.0)
PEEP: 8 cmH2O
PH ART: 7.452 — AB (ref 7.350–7.450)
PO2 ART: 142 mmHg — AB (ref 83.0–108.0)
Patient temperature: 37.2
RATE: 30 resp/min

## 2017-07-04 LAB — BASIC METABOLIC PANEL
ANION GAP: 9 (ref 5–15)
BUN: 11 mg/dL (ref 6–20)
CALCIUM: 7.3 mg/dL — AB (ref 8.9–10.3)
CO2: 25 mmol/L (ref 22–32)
Chloride: 94 mmol/L — ABNORMAL LOW (ref 101–111)
Creatinine, Ser: 0.85 mg/dL (ref 0.44–1.00)
Glucose, Bld: 116 mg/dL — ABNORMAL HIGH (ref 65–99)
Potassium: 3.2 mmol/L — ABNORMAL LOW (ref 3.5–5.1)
Sodium: 128 mmol/L — ABNORMAL LOW (ref 135–145)

## 2017-07-04 LAB — GLUCOSE, CAPILLARY
GLUCOSE-CAPILLARY: 108 mg/dL — AB (ref 65–99)
GLUCOSE-CAPILLARY: 113 mg/dL — AB (ref 65–99)
GLUCOSE-CAPILLARY: 72 mg/dL (ref 65–99)
Glucose-Capillary: 100 mg/dL — ABNORMAL HIGH (ref 65–99)
Glucose-Capillary: 120 mg/dL — ABNORMAL HIGH (ref 65–99)
Glucose-Capillary: 73 mg/dL (ref 65–99)

## 2017-07-04 LAB — HEPATIC FUNCTION PANEL
ALBUMIN: 2.2 g/dL — AB (ref 3.5–5.0)
ALK PHOS: 104 U/L (ref 38–126)
ALT: 45 U/L (ref 14–54)
AST: 50 U/L — AB (ref 15–41)
BILIRUBIN TOTAL: 0.4 mg/dL (ref 0.3–1.2)
Bilirubin, Direct: 0.1 mg/dL (ref 0.1–0.5)
Indirect Bilirubin: 0.3 mg/dL (ref 0.3–0.9)
Total Protein: 5.6 g/dL — ABNORMAL LOW (ref 6.5–8.1)

## 2017-07-04 LAB — MAGNESIUM
MAGNESIUM: 2 mg/dL (ref 1.7–2.4)
Magnesium: 1.6 mg/dL — ABNORMAL LOW (ref 1.7–2.4)

## 2017-07-04 LAB — CBC
HCT: 27.1 % — ABNORMAL LOW (ref 36.0–46.0)
Hemoglobin: 9.3 g/dL — ABNORMAL LOW (ref 12.0–15.0)
MCH: 30.3 pg (ref 26.0–34.0)
MCHC: 34.3 g/dL (ref 30.0–36.0)
MCV: 88.3 fL (ref 78.0–100.0)
PLATELETS: 136 10*3/uL — AB (ref 150–400)
RBC: 3.07 MIL/uL — AB (ref 3.87–5.11)
RDW: 14.7 % (ref 11.5–15.5)
WBC: 15.2 10*3/uL — ABNORMAL HIGH (ref 4.0–10.5)

## 2017-07-04 LAB — PHOSPHORUS
PHOSPHORUS: 3.6 mg/dL (ref 2.5–4.6)
PHOSPHORUS: 3.8 mg/dL (ref 2.5–4.6)

## 2017-07-04 LAB — LACTIC ACID, PLASMA: Lactic Acid, Venous: 0.9 mmol/L (ref 0.5–1.9)

## 2017-07-04 MED ORDER — VITAL HIGH PROTEIN PO LIQD
1000.0000 mL | ORAL | Status: DC
  Filled 2017-07-04: qty 1000

## 2017-07-04 MED ORDER — VITAL HIGH PROTEIN PO LIQD
1000.0000 mL | ORAL | Status: DC
  Administered 2017-07-04 – 2017-07-06 (×3): 1000 mL
  Filled 2017-07-04 (×4): qty 1000

## 2017-07-04 MED ORDER — VITAL HIGH PROTEIN PO LIQD: 1000.0000 mL | ORAL | Status: DC

## 2017-07-04 MED ORDER — PRO-STAT SUGAR FREE PO LIQD
60.0000 mL | Freq: Two times a day (BID) | ORAL | Status: DC
  Administered 2017-07-04 – 2017-07-07 (×6): 60 mL
  Filled 2017-07-04 (×7): qty 60

## 2017-07-04 MED ORDER — FREE WATER
100.0000 mL | Freq: Four times a day (QID) | Status: DC
  Administered 2017-07-04 (×2): 100 mL

## 2017-07-04 MED ORDER — PRO-STAT SUGAR FREE PO LIQD: 30.0000 mL | Freq: Two times a day (BID) | ORAL | Status: DC

## 2017-07-04 MED ORDER — ADULT MULTIVITAMIN LIQUID CH
15.0000 mL | Freq: Every day | ORAL | Status: DC
  Administered 2017-07-05 – 2017-07-14 (×10): 15 mL
  Filled 2017-07-04 (×14): qty 15

## 2017-07-04 MED ORDER — MAGNESIUM SULFATE 2 GM/50ML IV SOLN
2.0000 g | Freq: Once | INTRAVENOUS | Status: AC
  Administered 2017-07-04: 2 g via INTRAVENOUS
  Filled 2017-07-04: qty 50

## 2017-07-04 MED ORDER — POTASSIUM CHLORIDE 10 MEQ/50ML IV SOLN
10.0000 meq | INTRAVENOUS | Status: AC
  Administered 2017-07-04 (×4): 10 meq via INTRAVENOUS
  Filled 2017-07-04 (×4): qty 50

## 2017-07-04 NOTE — Progress Notes (Signed)
Subjective: currently on propofol and intubated breathing over the vent.   Exam: Vitals:   07/04/17 0700 07/04/17 0855  BP:    Pulse: 71 69  Resp: (!) 30 (!) 26  Temp: 99 F (37.2 C)   SpO2: 100% 98%    Physical Exam   HEENT-  Normocephalic, no lesions, without obvious abnormality.  Normal external eye and conjunctiva.   Cardiovascular- S1-S2 audible, pulses palpable throughout   Lungs-no rhonchi or wheezing noted, no excessive working breathing.  Saturations within normal limits Abdomen- All 4 quadrants palpated and nontender Extremities- Warm, dry and intact Musculoskeletal-no joint tenderness, deformity or swelling Skin-warm and dry, no hyperpigmentation, vitiligo, or suspicious lesions    Neuro:  Mental Status: Able to attempt to follow commands --when asked to open eyes wrinkles forehead in attempt to open eyes, able to give thumbs up, able to attempt to raise arms but not off bed, wiggle toes to command.  Cranial Nerves: II: cannot open eyes and but does blink to threat if eyes manually opened  III,IV, VI:  Eyes midline, pupils equal, round, reactive to light and accommodation V,VII: face symmetric, facial light touch sensation normal bilaterally VIII: hearing normal bilaterally  Motor: As noted above in mental status Sensory: winces to pinprick throught Deep Tendon Reflexes: 2+ and symmetric throughout LE and 1+ right BR and Bicep, 2+ left BR and Bicep Plantars: Right: downgoing   Left: downgoing   Medications:  Scheduled: . aspirin  81 mg Per Tube Daily  . chlorhexidine  15 mL Mouth Rinse BID  . [START ON 07/05/2017] Chlorhexidine Gluconate Cloth  6 each Topical Daily  . enoxaparin (LOVENOX) injection  40 mg Subcutaneous Q24H  . feeding supplement (PRO-STAT SUGAR FREE 64)  30 mL Per Tube BID  . feeding supplement (VITAL HIGH PROTEIN)  1,000 mL Per Tube Q24H  . hydrocortisone sod succinate (SOLU-CORTEF) inj  50 mg Intravenous Q6H  . insulin aspart  3-9 Units  Subcutaneous Q4H  . mouth rinse  15 mL Mouth Rinse q12n4p  . mupirocin ointment  1 application Nasal BID  . pantoprazole sodium  40 mg Per Tube Q24H   Continuous: . sodium chloride 50 mL/hr at 07/03/17 1134  . amiodarone 30 mg/hr (07/04/17 0103)  . magnesium sulfate 1 - 4 g bolus IVPB    . norepinephrine (LEVOPHED) Adult infusion 8 mcg/min (07/04/17 0500)  . piperacillin-tazobactam (ZOSYN)  IV 3.375 g (07/04/17 9629)  . propofol (DIPRIVAN) infusion 30 mcg/kg/min (07/04/17 0618)  . vasopressin (PITRESSIN) infusion - *FOR SHOCK* 0.03 Units/min (07/03/17 2000)   BMW:[id number] **OR** acetaminophen, bisacodyl, diclofenac sodium, docusate, fentaNYL (SUBLIMAZE) injection, ipratropium-albuterol, midazolam, sodium chloride flush  Pertinent Labs/Diagnostics: IMPRESSION: EEG This is an abnormal EEG demonstrating diffuse slowing of electrocerebral activity. This can be seen in a wide variety of encephalopathic states including those of a toxic, metabolic, or degenerative nature. This could also be a result of sedating medications. There was no epileptiform activity or asymmetries on this EEG. Repeat EEG when off of sedating medications could be of value if seizure is a clinical concern. Correlate clinically.     Dg Abd 1 View  Result Date: 07/03/2017 . IMPRESSION: 1. Interval placement of a left IJ central line with tip over central SVC. No pneumothorax. Endotracheal tube above the carina and enteric tube likely in the distal stomach. 2. Cardiomegaly with findings of CHF. Pneumonia is not excluded. Clinical correlation is recommended. Electronically Signed   By: Anner Crete M.D.   On: 07/03/2017 05:26  Ct Head Wo Contrast  Result Date: 07/03/2017 IMPRESSION: 1. Several new small areas of hypoattenuation are present in right frontal, right parietal, right occipital, left posterior temporal lobes as well as bilateral cerebellar hemispheres, probably representing interval infarctions. Multiple  vascular territories suggests embolic phenomenon. 2. Stable lucency in left frontal lobe probably representing chronic microvascular ischemic change, possibly treated metastasis. These results will be called to the ordering clinician or representative by the Radiologist Assistant, and communication documented in the PACS or zVision Dashboard. Electronically Signed   By: Kristine Garbe M.D.   On: 07/03/2017 18:00   Ct Chest Wo Contrast  Result Date: 07/03/2017  IMPRESSION: Extensive BILATERAL pulmonary infiltrates which could be related to infection or aspiration. Small to moderate low-attenuation RIGHT pleural effusion. BILATERAL rib and sternal fractures question related to CPR/resuscitation. Small pericardial effusion. Electronically Signed   By: Lavonia Dana M.D.   On: 07/03/2017 18:01         Impression: 67 year old female with coma status post cardiac arrest. Improved today in following commands. EEG shows slowing but no epileptiform activity. CT as above --embolic strokes likely secondary to cardiac arrest/CPR. Currently on ASA.  Once improved enough clearly will need a echocardiogram.  When stable would recommend MRI brain.  Recommend LDL and A1c.   Today patient is showing improvement as noted above.  Given her improvement do not feel that prognosis is poor.  At this time neurology will sign off.  All with any questions.    Etta Quill PA-C [facility] [phone]      07/04/2017, 10:36 AM

## 2017-07-04 NOTE — Progress Notes (Deleted)
Subjective: currently on propofol and intubated breathing over the vent.   Exam: Vitals:   07/04/17 0600 07/04/17 0700  BP:    Pulse: 66 71  Resp: (!) 30 (!) 30  Temp: 99 F (37.2 C) 99 F (37.2 C)  SpO2: 100% 100%    Physical Exam   HEENT-  Normocephalic, no lesions, without obvious abnormality.  Normal external eye and conjunctiva.   Cardiovascular- S1-S2 audible, pulses palpable throughout   Lungs-no rhonchi or wheezing noted, no excessive working breathing.  Saturations within normal limits Abdomen- All 4 quadrants palpated and nontender Extremities- Warm, dry and intact Musculoskeletal-no joint tenderness, deformity or swelling Skin-warm and dry, no hyperpigmentation, vitiligo, or suspicious lesions    Neuro:  Mental Status: Able to attempt to follow commands --when asked to open eyes wrinkles forehead in attempt to open eyes, able to give thumbs up, able to attempt to raise arms but not off bed, wiggle toes to command.  Cranial Nerves: II: cannot open eyes and but does blink to threat if eyes manually opened  III,IV, VI:  Eyes midline, pupils equal, round, reactive to light and accommodation V,VII: face symmetric, facial light touch sensation normal bilaterally VIII: hearing normal bilaterally  Motor: As noted above in mental status Sensory: winces to pinprick throught Deep Tendon Reflexes: 2+ and symmetric throughout LE and 1+ right BR and Bicep, 2+ left BR and Bicep Plantars: Right: downgoing   Left: downgoing   Medications:  Scheduled: . aspirin  81 mg Per Tube Daily  . chlorhexidine  15 mL Mouth Rinse BID  . [START ON 07/05/2017] Chlorhexidine Gluconate Cloth  6 each Topical Daily  . enoxaparin (LOVENOX) injection  40 mg Subcutaneous Q24H  . feeding supplement (PRO-STAT SUGAR FREE 64)  30 mL Per Tube BID  . feeding supplement (VITAL HIGH PROTEIN)  1,000 mL Per Tube Q24H  . hydrocortisone sod succinate (SOLU-CORTEF) inj  50 mg Intravenous Q6H  . insulin  aspart  3-9 Units Subcutaneous Q4H  . mouth rinse  15 mL Mouth Rinse q12n4p  . mupirocin ointment  1 application Nasal BID  . pantoprazole sodium  40 mg Per Tube Q24H   Continuous: . sodium chloride 50 mL/hr at 07/03/17 1134  . amiodarone 30 mg/hr (07/04/17 0103)  . magnesium sulfate 1 - 4 g bolus IVPB    . norepinephrine (LEVOPHED) Adult infusion 8 mcg/min (07/04/17 0500)  . piperacillin-tazobactam (ZOSYN)  IV 3.375 g (07/04/17 4665)  . propofol (DIPRIVAN) infusion 30 mcg/kg/min (07/04/17 0618)  . vasopressin (PITRESSIN) infusion - *FOR SHOCK* 0.03 Units/min (07/03/17 2000)   LDJ:[id number] **OR** acetaminophen, bisacodyl, diclofenac sodium, docusate, fentaNYL (SUBLIMAZE) injection, ipratropium-albuterol, midazolam, sodium chloride flush  Pertinent Labs/Diagnostics: IMPRESSION: EEG This is an abnormal EEG demonstrating diffuse slowing of electrocerebral activity. This can be seen in a wide variety of encephalopathic states including those of a toxic, metabolic, or degenerative nature. This could also be a result of sedating medications. There was no epileptiform activity or asymmetries on this EEG. Repeat EEG when off of sedating medications could be of value if seizure is a clinical concern. Correlate clinically.     Dg Abd 1 View  Result Date: 07/03/2017 . IMPRESSION: 1. Interval placement of a left IJ central line with tip over central SVC. No pneumothorax. Endotracheal tube above the carina and enteric tube likely in the distal stomach. 2. Cardiomegaly with findings of CHF. Pneumonia is not excluded. Clinical correlation is recommended. Electronically Signed   By: Laren Everts.D.  On: 07/03/2017 05:26   Ct Head Wo Contrast  Result Date: 07/03/2017 IMPRESSION: 1. Several new small areas of hypoattenuation are present in right frontal, right parietal, right occipital, left posterior temporal lobes as well as bilateral cerebellar hemispheres, probably representing interval  infarctions. Multiple vascular territories suggests embolic phenomenon. 2. Stable lucency in left frontal lobe probably representing chronic microvascular ischemic change, possibly treated metastasis. These results will be called to the ordering clinician or representative by the Radiologist Assistant, and communication documented in the PACS or zVision Dashboard. Electronically Signed   By: Kristine Garbe M.D.   On: 07/03/2017 18:00   Ct Chest Wo Contrast  Result Date: 07/03/2017  IMPRESSION: Extensive BILATERAL pulmonary infiltrates which could be related to infection or aspiration. Small to moderate low-attenuation RIGHT pleural effusion. BILATERAL rib and sternal fractures question related to CPR/resuscitation. Small pericardial effusion. Electronically Signed   By: Lavonia Dana M.D.   On: 07/03/2017 18:01         Impression: 67 year old female with coma status post cardiac arrest. Improved today in following commands. EEG shows slowing but no epileptiform activity. CT as above --embolic strokes likely secondary to cardiac arrest/CPR. Currently on ASA.  Once improved enough clearly will need a echocardiogram.  When stable would recommend MRI brain.  Recommend LDL and A1c.   Today patient is showing improvement as noted above.  Given her improvement do not feel that prognosis is poor.  At this time neurology will sign off.  All with any questions.    Etta Quill PA-C [facility] [phone]      07/04/2017, 8:28 AM

## 2017-07-04 NOTE — Progress Notes (Signed)
Nutrition Follow-up  DOCUMENTATION CODES:   Obesity unspecified  INTERVENTION:  - Will order 60 mL Prostat BID with Vital High Protein @ 10 mL/hr to advance by 10 mL every 8 hours to reach goal rate of Vital High Protein @ 30 mL/hr.  - At goal rate, this regimen + kcal from current Propofol rate will provide 1453 kcal, 123 grams of protein, and 602 mL free water. - Will order daily multivitamin with minerals and 100 mL free water QID.  Monitor magnesium, potassium, and phosphorus daily for at least 3 days, MD to replete as needed, as pt is at risk for refeeding syndrome given current hypokalemia and hypomagnesemia.    NUTRITION DIAGNOSIS:   Inadequate oral intake related to inability to eat as evidenced by NPO status. -ongoing  GOAL:   Provide needs based on ASPEN/SCCM guidelines -unmet at this time  MONITOR:   TF tolerance, Vent status, Weight trends, Labs  REASON FOR ASSESSMENT:   Consult Enteral/tube feeding initiation and management  ASSESSMENT:   67 yr old female with PMHx of NSCLS with mets to the brain, spine and liver s/p Gamma knife at [facility] on 3/26 and 3/27 presented to [facility] with nausea and vomiting accompanied by HA and SOB. Admitted to Hospitalist service for hypovolemic hyponatremia.  Significant Events: 3/28 - admission 4/1 - VF cardiac arrest with 30 minutes of CPR, intubated, OGT placed 4/1 - EEG completed, abnormal 4/2 - TF initiated  Consult received. Weight has been stable throughout admission. Two family members at bedside at this time; very pleasant but not interested in discussion about nutrition-related questions this AM. Pt is intubated, sedated, with OGT in place. TF order outlined above.   Per Dr. Juanetta Gosling note this AM: anoxic encephalopathy, aspiration PNA, MAP goal >65, adrenal insufficiency, hypovolemic hyponatremia, metabolic, stage 4 SNCLC with mets s/p gamma knife at [facility].   Patient is currently intubated on ventilator support MV: 11.4  L/min Temp (24hrs), Avg:99.1 F (37.3 C), Min:95.4 F (35.2 C), Max:100.9 F (38.3 C) Propofol: 12.6 ml/hr (333) BP: 128/63 and MAP: 85  Medications reviewed; 50 mg Solu-cortef QID, sliding scale Novolog, 2 g IV Mg sulfate x1 run today, 10 mEq IV KCl x4 runs today. Labs reviewed; CBG: 100 mg/dL, Na: 127 mmol/L, K: 3 mmol/L, Cl: 94 mmol/L, Mg: 1.6 mg/dL, AST elevated.  IVF: NS @ 50 mL/hr. Drips: Propofol @ 20 mcg/kg/min, Amiodarone @ 30.06 mg/hr, Vaso @ 0.03 units/min, Levo @ 6 mcg/min.    Diet Order:  Diet NPO time specified  EDUCATION NEEDS:   No education needs have been identified at this time  Skin:  Skin Assessment: Skin Integrity Issues: Skin Integrity Issues:: Other (Comment) Other: non-pressure injury to L leg  Last BM:  3/31  Height:   Ht Readings from Last 1 Encounters:  06/30/17 5\' 5"  (1.651 m)    Weight:   Wt Readings from Last 1 Encounters:  06/30/17 231 lb 11.3 oz (105.1 kg)    Ideal Body Weight:  56.82 kg  BMI:  Body mass index is 38.56 kg/m.  Estimated Nutritional Needs:   Kcal:  6226-3335 (11-14 kcal/kg)  Protein:  >/= 114 grams (2 grams/kg IBW)  Fluid:  >/= 1.5 L/day      Jarome Matin, MS, RD, LDN, [facility] Inpatient Clinical Dietitian Pager # [phone] After hours/weekend pager # [phone]

## 2017-07-04 NOTE — Progress Notes (Signed)
Wasted 15cc of versed drip( 1 mg per ml)  Wasted 95 cc Fentanly Drip ( 10 mcg/ml)  Witnessed by [employer] RN

## 2017-07-04 NOTE — Progress Notes (Signed)
PULMONARY / CRITICAL CARE MEDICINE   Name: Kestrel Mis MRN: [id number] DOB: 1950/04/05    ADMISSION DATE:  06/29/2017 CONSULTATION DATE:  07/03/2017  REFERRING MD:  Dr. Maylene Roes, [facility]  CHIEF COMPLAINT:  Cardiac arrest  HISTORY OF PRESENT ILLNESS:   67 yo female with nausea and vomiting.  She has hx of Stage IV NSCLC with brain metastases, ulcerated lipodermatosclerosis.  Found to have hyponatremia from hypovolemia with sodium of 116.  She developed cardiac arrest from VF 4/01, had ROSC after 30 minutes and transferred to ICU.  Found to have multiple infarcts on CT head, NSTEMI, aspiration pneumonia.  SUBJECTIVE:  Reported to open eyes spontaneously and squeeze hands overnight.  Remains on pressors and full vent support.  VITAL SIGNS: BP (!) 176/97 (BP Location: Right Arm)   Pulse 71   Temp 99 F (37.2 C)   Resp (!) 30   Ht 5\' 5"  (1.651 m)   Wt 231 lb 11.3 oz (105.1 kg)   SpO2 100%   BMI 38.56 kg/m   HEMODYNAMICS: CVP:  [15 mmHg-18 mmHg] 15 mmHg  VENTILATOR SETTINGS: Vent Mode: AC FiO2 (%):  [60 %-80 %] 60 % Set Rate:  [30 bmp] 30 bmp Vt Set:  [460 mL] 460 mL PEEP:  [8 cmH20-10 cmH20] 8 cmH20 Plateau Pressure:  [21 cmH20-22 cmH20] 22 cmH20  INTAKE / OUTPUT: I/O last 3 completed shifts: In: 3972.7 [I.V.:3922.7; IV Piggyback:50] Out: 6644 [Urine:1075; Emesis/NG output:100]  PHYSICAL EXAMINATION:  General - sedated Eyes - pupils pinpoint and reactive ENT - ETT in place Cardiac - regular, no murmur Chest - better air movement, no wheeze Abd - soft, non tender Ext - 1+ edema Skin - Lt lower leg in wrap Neuro - RASS -3  LABS:  BMET Recent Labs  Lab 07/02/17 2121 07/03/17 0300 07/03/17 2015  NA 126* 126* 127*  K 3.4* 3.7 3.0*  CL 96* 97* 94*  CO2 21* 17* 21*  BUN <5* 5* 10  CREATININE 0.54 0.76 0.95  GLUCOSE 95 324* 154*    Electrolytes Recent Labs  Lab 07/02/17 2121 07/03/17 0300 07/03/17 0314 07/03/17 2015 07/04/17 0630  CALCIUM 8.4* 8.1*  --   7.4*  --   MG  --  1.9 1.8  --  1.6*  PHOS  --   --  5.4*  --  3.6    CBC Recent Labs  Lab 07/02/17 0402 07/03/17 0527 07/04/17 0630  WBC 5.4 19.6* 15.2*  HGB 9.5* 10.7* 9.3*  HCT 27.9* 32.2* 27.1*  PLT 142* 226 136*    Coag's No results for input(s): APTT, INR in the last 168 hours.  Sepsis Markers Recent Labs  Lab 07/03/17 0314 07/03/17 0527 07/04/17 0630  LATICACIDVEN 2.5* 2.0* 0.9    ABG Recent Labs  Lab 07/03/17 1320 07/03/17 1927 07/04/17 0451  PHART 7.250* 7.344* 7.452*  PCO2ART 52.1* 45.2 34.2  PO2ART 89.6 178* 142*    Liver Enzymes Recent Labs  Lab 06/29/17 2320 07/03/17 0300 07/04/17 0630  AST 32 212* 50*  ALT 12* 93* 45  ALKPHOS 106 172* 104  BILITOT 0.9 0.5 0.4  ALBUMIN 3.5 2.8* 2.2*    Cardiac Enzymes Recent Labs  Lab 07/03/17 0314 07/03/17 1111  TROPONINI 2.54*  2.39* 4.86*    Glucose Recent Labs  Lab 07/03/17 0748 07/03/17 1136 07/03/17 1559 07/03/17 1945 07/03/17 2302 07/04/17 0302  GLUCAP 217* 172* 127* 124* 121* 100*    Imaging Ct Head Wo Contrast  Result Date: 07/03/2017 CLINICAL DATA:  67  y/o F; history of metastatic non-small cell cancer with brain metastasis. Cardiac arrest. EXAM: CT HEAD WITHOUT CONTRAST TECHNIQUE: Contiguous axial images were obtained from the base of the skull through the vertex without intravenous contrast. COMPARISON:  12/31/2017 CT of the head. FINDINGS: Brain: New small areas of cortical hypoattenuation in the right parietal lobe (series 2: Image 21), right occipital lobe 2:15, left posterior temporal lobe 2:14, right frontal lobe 2:13, and bilateral cerebellar hemispheres 2:9 and 8. Cortical and basal ganglia gray-white differentiation is maintained at this time. No hemorrhage or focal mass effect. Stable small lucencies in the left frontal lobe. No extra-axial collection, hydrocephalus, or effacement of basilar cisterns. Stable mild brain parenchymal volume loss. Vascular: Calcific  atherosclerosis of carotid siphons. No hyperdense vessel. Skull: Normal. Negative for fracture or focal lesion. Sinuses/Orbits: No acute finding. Other: None. IMPRESSION: 1. Several new small areas of hypoattenuation are present in right frontal, right parietal, right occipital, left posterior temporal lobes as well as bilateral cerebellar hemispheres, probably representing interval infarctions. Multiple vascular territories suggests embolic phenomenon. 2. Stable lucency in left frontal lobe probably representing chronic microvascular ischemic change, possibly treated metastasis. These results will be called to the ordering clinician or representative by the Radiologist Assistant, and communication documented in the PACS or zVision Dashboard. Electronically Signed   By: Kristine Garbe M.D.   On: 07/03/2017 18:00   Ct Chest Wo Contrast  Result Date: 07/03/2017 CLINICAL DATA:  Metastatic non-small cell lung cancer with metastasis to brain, hospitalized with hyponatremia, nausea, and vomiting, underwent cardiac arrest with CPR EXAM: CT CHEST WITHOUT CONTRAST TECHNIQUE: Multidetector CT imaging of the chest was performed following the standard protocol without IV contrast. Sagittal and coronal MPR images reconstructed from axial data set. Extensive beam hardening artifacts from inclusion of patient's arms in imaged field. COMPARISON:  None FINDINGS: Cardiovascular: Aorta normal caliber. Minimal pericardial effusion. Heart otherwise unremarkable. LEFT jugular line with tip in SVC. Mediastinum/Nodes: Tip of endotracheal tube above carina. Nasogastric tube coiled in stomach. Esophagus unremarkable. No definite thoracic adenopathy though hilar assessment is limited by the lack of IV contrast. Base of cervical region grossly unremarkable. Lungs/Pleura: Extensive BILATERAL pulmonary infiltrates greatest in LEFT lower lobe. Small to moderate RIGHT pleural effusion. Unable to exclude underlying mass lesions due to  the severity of observed infiltrates. Upper Abdomen: Visualized upper abdomen unremarkable Musculoskeletal: BILATERAL anterior rib fractures which may related to CPR. Bones demineralized. Mid sternal fracture. IMPRESSION: Extensive BILATERAL pulmonary infiltrates which could be related to infection or aspiration. Small to moderate low-attenuation RIGHT pleural effusion. BILATERAL rib and sternal fractures question related to CPR/resuscitation. Small pericardial effusion. Electronically Signed   By: Lavonia Dana M.D.   On: 07/03/2017 18:01   Dg Chest Port 1 View  Result Date: 07/04/2017 CLINICAL DATA:  resp failure EXAM: PORTABLE CHEST 1 VIEW COMPARISON:  Chest radiograph and CT 07/03/2017. FINDINGS: ET tube 4.5 cm above carina. BILATERAL pulmonary opacities are improved. Significant residual lower lobe opacities remain. Orogastric tube tip not visualized. No pneumothorax. Rib fractures better demonstrated on CT. IMPRESSION: Improved aeration. Electronically Signed   By: Staci Righter M.D.   On: 07/04/2017 07:03     STUDIES:  Echo 4/01 >> EF 60 to 65%, mod LVH, grade 1 DD, mild RV dilation, mild/mod RV systolic dysfx, small pericardial effusion EEG 4/01 >> diffuse slowing CT head 4/01 >> several small areas of hypoattenuation Rt frontal, Rt parietal, Rt occipital, Lt posterior temporal lobes, b/l cerebellar hemispheres CT chest 4/01 >> extensive  b/l infiltrates, small Rt effusion, b/l anterior rib fxs, mid sternal fx Doppler legs 4/01 >> no DVT  CULTURES: Sputum 4/01 >>  ANTIBIOTICS: Zosyn 4/01 >>   SIGNIFICANT EVENTS: 3/28 Admit 4/01 VF cardiac arrest, neuro consulted  LINES/TUBES: ETT 4/01 >> Rt radial aline 4/01 >>  Lt IJ CVL 4/01 >>   DISCUSSION: Reported to have some improvement in neuro status overnight.  O2 needs and pressor needs improved some.  ASSESSMENT / PLAN:  Acute respiratory failure with hypoxia/hypercapnic. - full vent support - adjust PEEP/FiO2 to keep SpO2 >  92% - f/u CXR - prn BDs  Anoxic encephalopathy. - noted to have several areas of infarct on CT head ?from hypoxia - RASS goal 0 to -1 - f/u with neurology  Aspiration pneumonia. - day 2 of zosyn  Shock. - cardiac after VF arrest and septic after aspiration pneumonia - pressors to keep MAP > 65  Relative adrenal insufficiency. - cortisol 11.4 from 4/01 - continue solu cortef while on pressors  VF cardiac arrest. Hx of CAD, HTN. - normal EF on Echo - continue ASA - continue amiodarone for now - defer cardiology assessment until neuro status improved  Hypovolemic hyponatremia. - continue NS IV fluid - f/u BMET q8h  Hypokalemia, hypomagnesemia. - replace as needed  Metabolic acidosis. - improved - d/c HCO3 from IV fluid  Moderate protein calorie malnutrition. - start tube feeds  Ulcerated lipodermatosclerosis Lt leg. - wound care  Hyperglycemia.  - SSI  Anemia of critical illness and chronic disease. Mild thrombocytopenia. - f/u CBC  Stage IV NSCLC with brain metastasis s/p gamma knife at [facility]. - followed at [facility]  DVT prophylaxis - Lovenox SUP - protonix Nutrition - tube feeds Goals of care - DNR  Updated pt's husband at bedside  CC time 26 minutes  Chesley Mires, MD [place] 07/04/2017, 7:40 AM Pager:  [phone] After 3pm call: [phone]

## 2017-07-04 NOTE — Progress Notes (Signed)
[place] Progress Note Patient Name: Jennifer Delgado DOB: 04/03/1951 MRN: [id number]   Date of Service  07/04/2017  HPI/Events of Note  K+ = 3.0 and Creatinine = 0.95.  eICU Interventions  Will replace K+.      Intervention Category Major Interventions: Electrolyte abnormality - evaluation and management  Sommer,Steven Eugene 07/04/2017, 1:06 AM

## 2017-07-05 ENCOUNTER — Inpatient Hospital Stay (HOSPITAL_COMMUNITY): Payer: BLUE CROSS/BLUE SHIELD

## 2017-07-05 ENCOUNTER — Encounter (HOSPITAL_COMMUNITY): Payer: Self-pay | Admitting: Cardiology

## 2017-07-05 DIAGNOSIS — I4901 Ventricular fibrillation: Secondary | ICD-10-CM

## 2017-07-05 DIAGNOSIS — I2119 ST elevation (STEMI) myocardial infarction involving other coronary artery of inferior wall: Secondary | ICD-10-CM

## 2017-07-05 LAB — GLUCOSE, CAPILLARY
GLUCOSE-CAPILLARY: 106 mg/dL — AB (ref 65–99)
GLUCOSE-CAPILLARY: 112 mg/dL — AB (ref 65–99)
GLUCOSE-CAPILLARY: 107 mg/dL — AB (ref 65–99)
GLUCOSE-CAPILLARY: 99 mg/dL (ref 65–99)
Glucose-Capillary: 101 mg/dL — ABNORMAL HIGH (ref 65–99)
Glucose-Capillary: 111 mg/dL — ABNORMAL HIGH (ref 65–99)

## 2017-07-05 LAB — BASIC METABOLIC PANEL
ANION GAP: 10 (ref 5–15)
Anion gap: 10 (ref 5–15)
Anion gap: 10 (ref 5–15)
BUN: 12 mg/dL (ref 6–20)
BUN: 13 mg/dL (ref 6–20)
BUN: 17 mg/dL (ref 6–20)
CALCIUM: 7.2 mg/dL — AB (ref 8.9–10.3)
CHLORIDE: 96 mmol/L — AB (ref 101–111)
CO2: 24 mmol/L (ref 22–32)
CO2: 25 mmol/L (ref 22–32)
CO2: 26 mmol/L (ref 22–32)
CREATININE: 0.83 mg/dL (ref 0.44–1.00)
Calcium: 7.6 mg/dL — ABNORMAL LOW (ref 8.9–10.3)
Calcium: 7.8 mg/dL — ABNORMAL LOW (ref 8.9–10.3)
Chloride: 94 mmol/L — ABNORMAL LOW (ref 101–111)
Chloride: 94 mmol/L — ABNORMAL LOW (ref 101–111)
Creatinine, Ser: 0.8 mg/dL (ref 0.44–1.00)
Creatinine, Ser: 0.82 mg/dL (ref 0.44–1.00)
GFR calc Af Amer: >60 mL/min (ref >60)
GFR calc Af Amer: >60 mL/min (ref >60)
GFR calc non Af Amer: >60 mL/min (ref >60)
GLUCOSE: 133 mg/dL — AB (ref 65–99)
Glucose, Bld: 106 mg/dL — ABNORMAL HIGH (ref 65–99)
Glucose, Bld: 123 mg/dL — ABNORMAL HIGH (ref 65–99)
POTASSIUM: 3 mmol/L — AB (ref 3.5–5.1)
POTASSIUM: 2.9 mmol/L — AB (ref 3.5–5.1)
POTASSIUM: 4 mmol/L (ref 3.5–5.1)
SODIUM: 129 mmol/L — AB (ref 135–145)
Sodium: 128 mmol/L — ABNORMAL LOW (ref 135–145)
Sodium: 132 mmol/L — ABNORMAL LOW (ref 135–145)

## 2017-07-05 LAB — BLOOD GAS, ARTERIAL
ACID-BASE EXCESS: 2.8 mmol/L — AB (ref 0.0–2.0)
BICARBONATE: 25.8 mmol/L (ref 20.0–28.0)
Drawn by: 225631
FIO2: 40
O2 Saturation: 99.1 %
PEEP/CPAP: 8 cmH2O
PH ART: 7.485 — AB (ref 7.350–7.450)
Patient temperature: 99.1
RATE: 24 resp/min
VT: 460 mL
pCO2 arterial: 34.8 mmHg (ref 32.0–48.0)
pO2, Arterial: 148 mmHg — ABNORMAL HIGH (ref 83.0–108.0)

## 2017-07-05 LAB — MAGNESIUM
Magnesium: 2 mg/dL (ref 1.7–2.4)
Magnesium: 1.8 mg/dL (ref 1.7–2.4)

## 2017-07-05 LAB — CULTURE, RESPIRATORY W GRAM STAIN

## 2017-07-05 LAB — PHOSPHORUS
PHOSPHORUS: 3 mg/dL (ref 2.5–4.6)
Phosphorus: 3.3 mg/dL (ref 2.5–4.6)

## 2017-07-05 LAB — CBC
HCT: 23 % — ABNORMAL LOW (ref 36.0–46.0)
Hemoglobin: 7.9 g/dL — ABNORMAL LOW (ref 12.0–15.0)
MCH: 31 pg (ref 26.0–34.0)
MCHC: 34.3 g/dL (ref 30.0–36.0)
MCV: 90.2 fL (ref 78.0–100.0)
PLATELETS: 128 10*3/uL — AB (ref 150–400)
RBC: 2.55 MIL/uL — ABNORMAL LOW (ref 3.87–5.11)
RDW: 15.2 % (ref 11.5–15.5)
WBC: 10.3 10*3/uL (ref 4.0–10.5)

## 2017-07-05 LAB — CULTURE, RESPIRATORY

## 2017-07-05 MED ORDER — POTASSIUM CHLORIDE 20 MEQ/15ML (10%) PO SOLN
40.0000 meq | ORAL | Status: AC
  Administered 2017-07-05 (×3): 40 meq
  Filled 2017-07-05 (×3): qty 30

## 2017-07-05 MED ORDER — FUROSEMIDE 10 MG/ML IJ SOLN
40.0000 mg | Freq: Once | INTRAMUSCULAR | Status: AC
  Administered 2017-07-05: 40 mg via INTRAVENOUS
  Filled 2017-07-05: qty 4

## 2017-07-05 NOTE — Consult Note (Addendum)
Cardiology Consultation:   Patient ID: Jennifer Delgado; [id number]; February 04, 1951   Admit date: 06/29/2017 Date of Consult: 07/05/2017  Primary Care Provider: Patient, No Pcp Per Primary Cardiologist: No primary care provider on file. DR Ishmael Holter at [facility]  Primary Electrophysiologist:  NA   Patient Profile:   Jennifer Delgado is a 67 y.o. female with a hx of pulmonary edema after second chemo, with normal Echo, Metastatic lung cancer-stage IV-metatases to brain, sternum and spine,  ulcerated lipodermatosclerosis of LLE, HTN who is being seen today for the evaluation of cardiac cause of v. fib at the request of Dr. Halford Chessman.  History of Present Illness:   Jennifer Delgado has a hx of pulmonary edema after second chemo, with normal Echo, Metastatic lung cancer-stage IV-metatases to sternum and spine, HTN.   Had been placed on Imdur for SOB.  Jennifer Delgado was admitted 06/29/17 for N & V which was new.  No fever, no chest pain.  Had chemo and cyber knife.  Her Na was 116 K+ 4.  Jennifer Delgado also had lower ext edema.  (attempt to transfer to [facility] but they were on diversion and could not accept)    Pt's Na did come up to 120 but on 07/03/17 pt had cardiac arrest CPR in progress.  Jennifer Delgado was given Mg+, epinephrine, and amiodarone, defibrillated multiple times.  Jennifer Delgado did have pulse at end of CODE.  CPR for 30 min.  Pt intubated.  Labs post code Na 126, K+ 3.7, Cr. 0.76, Mg+ 1.9, AST 212, ALT 93  EKGs personally reviewed  EKG ST with mild ST elevation in inf. Leads. EKG today SR slower rate with small Q waves inf. And T wave inversions I, AVL, V4-6 new , inf ST elevation improved. Troponin 2.39; 4.86  TSH 3.9 Cortisol 11.4  Hgb 10.7  WBC 19.6  CXR with cardiomegaly and interstitial edema.   K+ 2.9 today and Na 129, Cr 0.83  Hgb 7.9 Plts 128 WBC 10.3   Neuro has seen --EEG with slowing but no epileptiform activity.  CT head with embolic strokes.  Currently continues on Vent , imporving anoxic encephalopathy, aspiration PNA, shock, adrenal  insufficiency.     Past Medical History:  Diagnosis Date  . Brain cancer ([place])   . Cancer ([place])    lung  . Cellulitis   . Hypertension   . Liver cancer ([place])   . Renal disorder     Past Surgical History:  Procedure Laterality Date  . BRONCHOSCOPY    . LEG SURGERY    . THORACENTESIS       Home Medications:  Prior to Admission medications   Medication Sig Start Date End Date Taking? Authorizing Provider  albuterol (PROAIR HFA) 108 (90 Base) MCG/ACT inhaler Inhale 2 puffs into the lungs every 6 (six) hours as needed for wheezing or shortness of breath.  12/25/15 04/20/18 Yes [provider]  cetirizine (ZYRTEC) 10 MG tablet Take 10 mg by mouth at bedtime.   Yes [provider]  diclofenac sodium (VOLTAREN) 1 % GEL Apply 2 g topically 4 (four) times daily as needed (For pain.).    Yes [provider]  furosemide (LASIX) 20 MG tablet Take 20 mg by mouth daily.    Yes [provider]  gabapentin (NEURONTIN) 300 MG capsule Take 900 mg by mouth at bedtime. 04/27/17  Yes [provider]  HYDROcodone-acetaminophen (NORCO) 10-325 MG tablet Take 1 tablet by mouth every 6 (six) hours as needed (For pain.).    Yes [provider]  ibuprofen (ADVIL,MOTRIN) 200 MG tablet Take 800 mg by mouth every 6 (six) hours as needed for headache.   Yes [provider]  isosorbide mononitrate (IMDUR) 30 MG 24 hr tablet Take 30 mg by mouth daily. 06/14/17  Yes [provider]  lisinopril (PRINIVIL,ZESTRIL) 10 MG tablet Take 10 mg by mouth daily.   Yes [provider]  LORazepam (ATIVAN) 0.5 MG tablet Take 1 mg by mouth at bedtime.    Yes [provider]  Lorlatinib 100 MG TABS Take 100 mg by mouth daily. 05/16/17  Yes [provider]  pantoprazole (PROTONIX) 20 MG tablet Take 20 mg by mouth at bedtime. 04/18/17  Yes [provider]  potassium chloride SA (K-DUR,KLOR-CON) 20 MEQ tablet Take 20 mEq by mouth  daily.    Yes [provider]  prochlorperazine (COMPAZINE) 10 MG tablet Take 10 mg by mouth every 6 (six) hours as needed for nausea or vomiting.   Yes [provider]  sertraline (ZOLOFT) 100 MG tablet Take 200 mg by mouth daily.    Yes [provider]  triamcinolone cream (KENALOG) 0.1 % Apply 1 application topically 2 (two) times daily as needed (For rash after chemo.).  05/11/17  Yes [provider]    Inpatient Medications: Scheduled Meds: . aspirin  81 mg Per Tube Daily  . chlorhexidine  15 mL Mouth Rinse BID  . Chlorhexidine Gluconate Cloth  6 each Topical Daily  . enoxaparin (LOVENOX) injection  40 mg Subcutaneous Q24H  . feeding supplement (PRO-STAT SUGAR FREE 64)  60 mL Per Tube BID  . feeding supplement (VITAL HIGH PROTEIN)  1,000 mL Per Tube Q24H  . hydrocortisone sod succinate (SOLU-CORTEF) inj  50 mg Intravenous Q6H  . insulin aspart  3-9 Units Subcutaneous Q4H  . mouth rinse  15 mL Mouth Rinse q12n4p  . multivitamin  15 mL Per Tube Daily  . pantoprazole sodium  40 mg Per Tube Q24H  . potassium chloride  40 mEq Per Tube Q4H   Continuous Infusions: . sodium chloride 10 mL/hr at 07/05/17 0809  . amiodarone 30 mg/hr (07/05/17 1237)  . norepinephrine (LEVOPHED) Adult infusion Stopped (07/05/17 0949)  . piperacillin-tazobactam (ZOSYN)  IV Stopped (07/05/17 0955)  . propofol (DIPRIVAN) infusion 50 mcg/kg/min (07/05/17 1316)  . vasopressin (PITRESSIN) infusion - *FOR SHOCK* Stopped (07/04/17 1850)   PRN Meds: acetaminophen **OR** acetaminophen, bisacodyl, diclofenac sodium, docusate, fentaNYL (SUBLIMAZE) injection, ipratropium-albuterol, midazolam, sodium chloride flush  Allergies:    Allergies  Allergen Reactions  . Daptomycin Other (See Comments)    Rhyabdomyolosis  . Dilaudid [Hydromorphone] Nausea And Vomiting    Social History:   Social History   Socioeconomic History  . Marital status: Married    Spouse name: Not on file  .  Number of children: Not on file  . Years of education: Not on file  . Highest education level: Not on file  Occupational History  . Not on file  Social Needs  . Financial resource strain: Not on file  . Food insecurity:    Worry: Not on file    Inability: Not on file  . Transportation needs:    Medical: Not on file    Non-medical: Not on file  Tobacco Use  . Smoking status: Never Smoker  . Smokeless tobacco: Never Used  Substance and Sexual Activity  . Alcohol use: Yes    Comment: occ  . Drug use: No  . Sexual activity: Not on file  Lifestyle  .  Physical activity:    Days per week: Not on file    Minutes per session: Not on file  . Stress: Not on file  Relationships  . Social connections:    Talks on phone: Not on file    Gets together: Not on file    Attends religious service: Not on file    Active member of club or organization: Not on file    Attends meetings of clubs or organizations: Not on file    Relationship status: Not on file  . Intimate partner violence:    Fear of current or ex partner: Not on file    Emotionally abused: Not on file    Physically abused: Not on file    Forced sexual activity: Not on file  Other Topics Concern  . Not on file  Social History Narrative  . Not on file    Family History:   Pt is sedated and cannot give family history.  But from record, + breast cancer mother, multiple myeloma sister, brother with leukemia, + hear disease with father and earl death.    ROS:  Please see the history of present illness. ROS from previous notes pt is sedated, on vent and cannot answer questions. General:no colds or fevers, no weight changes Skin:no rashes or ulcers HEENT:no blurred vision, no congestion CV:see HPI--no prior cardiovascular history.  PUL:see HPI GI:no diarrhea constipation or melena, no indigestion, + liver cancer GU:no hematuria, no dysuria MS:no joint pain, no claudication Neuro:no syncope, no lightheadedness Endo:no  diabetes, no thyroid disease  All other ROS reviewed and negative.     Physical Exam/Data:   Vitals:   07/05/17 1200 07/05/17 1239 07/05/17 1300 07/05/17 1400  BP:      Pulse: 88   79  Resp: (!) 25  (!) 25 15  Temp: 98.4 F (36.9 C)  98.4 F (36.9 C) 98.1 F (36.7 C)  TempSrc:      SpO2: 97% 95%  97%  Weight:      Height:        Intake/Output Summary (Last 24 hours) at 07/05/2017 1447 Last data filed at 07/05/2017 0600 Gross per 24 hour  Intake 2228.03 ml  Output 785 ml  Net 1443.03 ml   Filed Weights   06/29/17 2359 06/30/17 0400 06/30/17 0427  Weight: 231 lb 11.3 oz (105.1 kg) 231 lb 11.3 oz (105.1 kg) 231 lb 11.3 oz (105.1 kg)   Body mass index is 38.56 kg/m.  General:  Well nourished, well developed, in no acute distress on sedation and Vent  HEENT: normal Lymph: no adenopathy Neck: no JVD Endocrine:  No thryomegaly Vascular: No carotid bruits; 2+ pedal pulses   Cardiac:  normal S1, S2; RRR; no murmur, gallup rub or click  Lungs:  Bilateral breath sounds to auscultation bilaterally, no wheezing, +  rhonchi no rales  Abd: soft, nontender, no hepatomegaly  Ext: no edema,  Musculoskeletal:  No deformities,pt unable to follow directions due to sedation.--Lt ankle being dressed with yellow dried drainage on dressing.  Chronic ulcer.  Skin: warm and dry  Neuro:  Sedated per RN, Jennifer Delgado has attempted to open eyes but is very weak Psych:  sedated   Relevant CV Studies: Echo 07/03/17 Study Conclusions  - Left ventricle: The cavity size was normal. Wall thickness was   increased in a pattern of moderate LVH. Systolic function was   normal. The estimated ejection fraction was in the range of 60%   to 65%. Wall motion was normal;  there were no regional wall   motion abnormalities. Doppler parameters are consistent with   abnormal left ventricular relaxation (grade 1 diastolic   dysfunction). - Right ventricle: The cavity size was mildly dilated. Systolic   function was  mildly to moderately reduced. - Pericardium, extracardiac: A small pericardial effusion was   identified. There was no evidence of hemodynamic compromise.   Features were not consistent with tamponade physiology.  Echo 02/2017  Technically difficult study due to chest wall/lung interference  Normal left ventricular systolic function, ejection fraction 60 to 65%  Dilated left atrium - mild  Normal right ventricular systolic function  Echo 83/38/25  Summary Mild concentric left ventricular hypertrophy Normal left ventricular systolic function. Ejection fraction is visually estimated at 60-65  Laboratory Data:  Chemistry Recent Labs  Lab 07/04/17 1421 07/04/17 2100 07/05/17 0600  NA 128* 128* 129*  K 3.2* 3.0* 2.9*  CL 94* 94* 94*  CO2 25 24 25   GLUCOSE 116* 106* 123*  BUN 11 12 13   CREATININE 0.85 0.80 0.83  CALCIUM 7.3* 7.2* 7.6*  GFRNONAA >60 >60 >60  GFRAA >60 >60 >60  ANIONGAP 9 10 10     Recent Labs  Lab 06/29/17 2320 07/03/17 0300 07/04/17 0630  PROT 7.4 6.7 5.6*  ALBUMIN 3.5 2.8* 2.2*  AST 32 212* 50*  ALT 12* 93* 45  ALKPHOS 106 172* 104  BILITOT 0.9 0.5 0.4   Hematology Recent Labs  Lab 07/03/17 0527 07/04/17 0630 07/05/17 0600  WBC 19.6* 15.2* 10.3  RBC 3.49* 3.07* 2.55*  HGB 10.7* 9.3* 7.9*  HCT 32.2* 27.1* 23.0*  MCV 92.3 88.3 90.2  MCH 30.7 30.3 31.0  MCHC 33.2 34.3 34.3  RDW 14.6 14.7 15.2  PLT 226 136* 128*   Cardiac Enzymes Recent Labs  Lab 07/03/17 0314 07/03/17 1111  TROPONINI 2.54*  2.39* 4.86*   No results for input(s): TROPIPOC in the last 168 hours.  BNPNo results for input(s): BNP, PROBNP in the last 168 hours.  DDimer No results for input(s): DDIMER in the last 168 hours.  Radiology/Studies:  Dg Chest 1 View  Result Date: 07/03/2017 CLINICAL DATA:  67 year old female with central line placement. EXAM: ABDOMEN - 1 VIEW; CHEST  1 VIEW COMPARISON:  Chest radiograph dated 07/03/2017 FINDINGS: Endotracheal tube above the  carina in similar position. Enteric tube extends into the stomach with tip likely located in the distal stomach. There has been interval placement of a left IJ central venous line with tip over central SVC at the level of the right hilum. No pneumothorax. Cardiomegaly with interstitial edema with more confluent airspace opacity in the right lung most likely representing asymmetric pulmonary edema although pneumonia is not excluded. Clinical correlation is recommended. Overall interval worsening of the airspace disease compared to the prior radiograph. Probable small right pleural effusion. No acute osseous pathology. IMPRESSION: 1. Interval placement of a left IJ central line with tip over central SVC. No pneumothorax. Endotracheal tube above the carina and enteric tube likely in the distal stomach. 2. Cardiomegaly with findings of CHF. Pneumonia is not excluded. Clinical correlation is recommended. Electronically Signed   By: Anner Crete M.D.   On: 07/03/2017 05:26   Dg Abd 1 View  Result Date: 07/03/2017 CLINICAL DATA:  68 year old female with central line placement. EXAM: ABDOMEN - 1 VIEW; CHEST  1 VIEW COMPARISON:  Chest radiograph dated 07/03/2017 FINDINGS: Endotracheal tube above the carina in similar position. Enteric tube extends into the stomach with tip likely located in  the distal stomach. There has been interval placement of a left IJ central venous line with tip over central SVC at the level of the right hilum. No pneumothorax. Cardiomegaly with interstitial edema with more confluent airspace opacity in the right lung most likely representing asymmetric pulmonary edema although pneumonia is not excluded. Clinical correlation is recommended. Overall interval worsening of the airspace disease compared to the prior radiograph. Probable small right pleural effusion. No acute osseous pathology. IMPRESSION: 1. Interval placement of a left IJ central line with tip over central SVC. No pneumothorax.  Endotracheal tube above the carina and enteric tube likely in the distal stomach. 2. Cardiomegaly with findings of CHF. Pneumonia is not excluded. Clinical correlation is recommended. Electronically Signed   By: Anner Crete M.D.   On: 07/03/2017 05:26   Ct Head Wo Contrast  Result Date: 07/03/2017 CLINICAL DATA:  67 y/o F; history of metastatic non-small cell cancer with brain metastasis. Cardiac arrest. EXAM: CT HEAD WITHOUT CONTRAST TECHNIQUE: Contiguous axial images were obtained from the base of the skull through the vertex without intravenous contrast. COMPARISON:  12/31/2017 CT of the head. FINDINGS: Brain: New small areas of cortical hypoattenuation in the right parietal lobe (series 2: Image 21), right occipital lobe 2:15, left posterior temporal lobe 2:14, right frontal lobe 2:13, and bilateral cerebellar hemispheres 2:9 and 8. Cortical and basal ganglia gray-white differentiation is maintained at this time. No hemorrhage or focal mass effect. Stable small lucencies in the left frontal lobe. No extra-axial collection, hydrocephalus, or effacement of basilar cisterns. Stable mild brain parenchymal volume loss. Vascular: Calcific atherosclerosis of carotid siphons. No hyperdense vessel. Skull: Normal. Negative for fracture or focal lesion. Sinuses/Orbits: No acute finding. Other: None. IMPRESSION: 1. Several new small areas of hypoattenuation are present in right frontal, right parietal, right occipital, left posterior temporal lobes as well as bilateral cerebellar hemispheres, probably representing interval infarctions. Multiple vascular territories suggests embolic phenomenon. 2. Stable lucency in left frontal lobe probably representing chronic microvascular ischemic change, possibly treated metastasis. These results will be called to the ordering clinician or representative by the Radiologist Assistant, and communication documented in the PACS or zVision Dashboard. Electronically Signed   By: Kristine Garbe M.D.   On: 07/03/2017 18:00   Ct Chest Wo Contrast  Result Date: 07/03/2017 CLINICAL DATA:  Metastatic non-small cell lung cancer with metastasis to brain, hospitalized with hyponatremia, nausea, and vomiting, underwent cardiac arrest with CPR EXAM: CT CHEST WITHOUT CONTRAST TECHNIQUE: Multidetector CT imaging of the chest was performed following the standard protocol without IV contrast. Sagittal and coronal MPR images reconstructed from axial data set. Extensive beam hardening artifacts from inclusion of patient's arms in imaged field. COMPARISON:  None FINDINGS: Cardiovascular: Aorta normal caliber. Minimal pericardial effusion. Heart otherwise unremarkable. LEFT jugular line with tip in SVC. Mediastinum/Nodes: Tip of endotracheal tube above carina. Nasogastric tube coiled in stomach. Esophagus unremarkable. No definite thoracic adenopathy though hilar assessment is limited by the lack of IV contrast. Base of cervical region grossly unremarkable. Lungs/Pleura: Extensive BILATERAL pulmonary infiltrates greatest in LEFT lower lobe. Small to moderate RIGHT pleural effusion. Unable to exclude underlying mass lesions due to the severity of observed infiltrates. Upper Abdomen: Visualized upper abdomen unremarkable Musculoskeletal: BILATERAL anterior rib fractures which may related to CPR. Bones demineralized. Mid sternal fracture. IMPRESSION: Extensive BILATERAL pulmonary infiltrates which could be related to infection or aspiration. Small to moderate low-attenuation RIGHT pleural effusion. BILATERAL rib and sternal fractures question related to CPR/resuscitation. Small pericardial effusion.  Electronically Signed   By: Lavonia Dana M.D.   On: 07/03/2017 18:01   Dg Chest Port 1 View  Result Date: 07/05/2017 CLINICAL DATA:  Respiratory failure. EXAM: PORTABLE CHEST 1 VIEW COMPARISON:  One-view chest x-ray 07/04/2017 FINDINGS: Heart is enlarged. Endotracheal tube left IJ line and NG tube are  stable. Interstitial edema and bilateral effusions are unchanged. Bibasilar airspace disease likely reflects atelectasis. IMPRESSION: 1. Stable appearance of congestive heart failure. 2. Support apparatus is stable. Electronically Signed   By: San Morelle M.D.   On: 07/05/2017 08:58   Dg Chest Port 1 View  Result Date: 07/04/2017 CLINICAL DATA:  resp failure EXAM: PORTABLE CHEST 1 VIEW COMPARISON:  Chest radiograph and CT 07/03/2017. FINDINGS: ET tube 4.5 cm above carina. BILATERAL pulmonary opacities are improved. Significant residual lower lobe opacities remain. Orogastric tube tip not visualized. No pneumothorax. Rib fractures better demonstrated on CT. IMPRESSION: Improved aeration. Electronically Signed   By: Staci Righter M.D.   On: 07/04/2017 07:03   Dg Chest Port 1 View  Result Date: 07/03/2017 CLINICAL DATA:  Endotracheal tube placement. Dyspnea and respiratory failure. EXAM: PORTABLE CHEST 1 VIEW COMPARISON:  None. FINDINGS: Portable AP supine view of the chest. Cardiomegaly with interstitial edema. Slightly more confluent airspace opacities in the right upper and lower lung. Endotracheal tube tip is 4.2 cm above the carina in satisfactory position. External defibrillator paddles project over the cardiac silhouette. No acute osseous abnormality. IMPRESSION: 1. Endotracheal tube tip is satisfactory in position approximately 4.2 cm above the carina. 2. Cardiomegaly with interstitial edema. Slightly more confluent airspace opacity in the right upper and lower. Superimposed CHF or pneumonia may contribute to this appearance. Electronically Signed   By: Ashley Royalty M.D.   On: 07/03/2017 03:19    Assessment and Plan:   1. V fib arrest with elevated troponin and EKG changes. EF remains stable.  Dr. Sallyanne Kuster to see. Pt is DNR. Possibly due to CPR --on amiodarone, at 0.5 mg, continue.   2.         Cerebral infarctions on CT head suggests embolic source.  May need TEE   3.         Hx of SOB  and seen by cards Jan 2019, imdur was added though no specific reason for SOB found, EF was normal.  4.          Stage IV NSCLC with brain metastases  5.          hyponatremia at 129 up from 116 per CCM  6.          Hypokalemia per CCM replacing  7.          Anemia with Hgb to 7.9 and thrombocytopenia  8.          Ulcerated lipodermatosclerosis lt leg followed by wound care   9.           Shock after arrest on norepinephrine per CCM   For questions or updates, please contact [place] Please consult [email or web address] for contact info under Cardiology/STEMI.   Signed, Cecilie Kicks, NP  07/05/2017 2:47 PM   I have seen and examined the patient along with Cecilie Kicks, NP .  I have reviewed the chart, notes and new data.  I agree with PA/NP's note.  Key new complaints: Intubated/sedated with propofol.  History obtained from chart and from patient's husband and son.  They tell me that Jennifer Delgado's oncologist in [facility], Dr. Truman Hayward has told him more  than once that Jennifer Delgado's particular type of lung cancer mutation makes it highly responsive to treatment.  While not curable, they expect long-term stable disease. Key examination changes: Sedated, so exam is limited, no abnormalities on cardiovascular examination, chronic skin changes of the lower extremities.  Hemodynamically stable. Key new findings / data: No arrhythmia seen in the last 24 hours.  PLAN: The exact sequence of events is puzzling.   The presence of inferior wall Q waves and ST segment elevation would suggest that Jennifer Delgado had an inferior STEMI with reperfusion, followed by malignant ventricular arrhythmia. However, the total increase in cardiac enzymes was minimal (troponin IV can simply be explained by the code and shocks), there is no evidence of any residual wall motion abnormality on the echocardiogram and overall LVEF is preserved.  It would be very surprising for such a small infarction to cause such severe arrhythmia.  In addition,  I do not see any trace of atherosclerotic calcification in the patient's coronary arteries or any of her thoracic vessels on chest CT, making coronary artery disease less likely. Another potential explanation for her arrhythmia would be metastatic infiltration of the myocardium, which can be easily missed on echocardiography.  Cardiac MRI would be more definitive. So far very stable hemodynamically and electrically on intravenous amiodarone.  We will plan to continue IV amiodarone at low dose as long as Jennifer Delgado is intubated. If Jennifer Delgado has good neurological recovery, switch amiodarone to oral agent and consider further workup with coronary angiography.  If there is no evidence of any significant coronary stenosis, consider cardiac MRI. My limited knowledge of oncology and lung cancer would suggest that her oncological prognosis is poor, but I would rely heavily on her oncologist's advice as to her prognosis. If we are to proceed to cardiac catheterization (with ensuing possible angioplasty and stent), I would want to know before the procedure whether or not her oncologist and/or neurology specialist think that dual antiplatelet can be safely given to this patient with brain metastases and recent gamma knife procedure. Discussed all these considerations in a lot of detail with the patient's husband and son.  Sanda Klein, MD, [place] [phone] 07/05/2017, 5:13 PM

## 2017-07-05 NOTE — Progress Notes (Signed)
PULMONARY / CRITICAL CARE MEDICINE   Name: Jennifer Delgado MRN: [id number] DOB: [birth year]    ADMISSION DATE:  06/29/2017 CONSULTATION DATE:  07/03/2017  REFERRING MD:  Dr. Maylene Roes, [facility]  CHIEF COMPLAINT:  Cardiac arrest  HISTORY OF PRESENT ILLNESS:   67 yo female with nausea and vomiting.  She has hx of Stage IV NSCLC with brain metastases, ulcerated lipodermatosclerosis.  Found to have hyponatremia from hypovolemia with sodium of 116.  She developed cardiac arrest from VF 4/01, had ROSC after 30 minutes and transferred to ICU.  Found to have multiple infarcts on CT head, NSTEMI, aspiration pneumonia.  SUBJECTIVE:  Decreased PEEP/FiO2.  Still on pressors.    VITAL SIGNS: BP 135/60   Pulse 67   Temp 98.8 F (37.1 C) (Bladder)   Resp (!) 24   Ht 5\' 5"  (1.651 m)   Wt 231 lb 11.3 oz (105.1 kg)   SpO2 99%   BMI 38.56 kg/m   HEMODYNAMICS: CVP:  [9 mmHg] 9 mmHg  VENTILATOR SETTINGS: Vent Mode: Other (Comment) FiO2 (%):  [40 %-50 %] 40 % Set Rate:  [24 bmp] 24 bmp Vt Set:  [460 mL] 460 mL PEEP:  [5 cmH20-8 cmH20] 8 cmH20 Plateau Pressure:  [17 cmH20-22 cmH20] 17 cmH20  INTAKE / OUTPUT: I/O last 3 completed shifts: In: 4763.4 [I.V.:4048.4; NG/GT:365; IV Piggyback:350] Out: 8099 [Urine:1430; Emesis/NG output:100]  PHYSICAL EXAMINATION:  General - sedated Eyes - pupils reactive ENT - ETT in place Cardiac - regular, no murmur Chest - no wheeze, rales Abd - soft, non tender Ext - 1+ edema Skin - Lt leg in wrap Neuro - has purposeful movements  LABS:  BMET Recent Labs  Lab 07/04/17 1421 07/04/17 2100 07/05/17 0600  NA 128* 128* 129*  K 3.2* 3.0* 2.9*  CL 94* 94* 94*  CO2 25 24 25   BUN 11 12 13   CREATININE 0.85 0.80 0.83  GLUCOSE 116* 106* 123*    Electrolytes Recent Labs  Lab 07/04/17 0630 07/04/17 1421 07/04/17 1827 07/04/17 2100 07/05/17 0600  CALCIUM  --  7.3*  --  7.2* 7.6*  MG 1.6*  --  2.0  --  2.0  PHOS 3.6  --  3.8  --  3.3    CBC Recent  Labs  Lab 07/03/17 0527 07/04/17 0630 07/05/17 0600  WBC 19.6* 15.2* 10.3  HGB 10.7* 9.3* 7.9*  HCT 32.2* 27.1* 23.0*  PLT 226 136* 128*    Coag's No results for input(s): APTT, INR in the last 168 hours.  Sepsis Markers Recent Labs  Lab 07/03/17 0314 07/03/17 0527 07/04/17 0630  LATICACIDVEN 2.5* 2.0* 0.9    ABG Recent Labs  Lab 07/03/17 1927 07/04/17 0451 07/05/17 0400  PHART 7.344* 7.452* 7.485*  PCO2ART 45.2 34.2 34.8  PO2ART 178* 142* 148*    Liver Enzymes Recent Labs  Lab 06/29/17 2320 07/03/17 0300 07/04/17 0630  AST 32 212* 50*  ALT 12* 93* 45  ALKPHOS 106 172* 104  BILITOT 0.9 0.5 0.4  ALBUMIN 3.5 2.8* 2.2*    Cardiac Enzymes Recent Labs  Lab 07/03/17 0314 07/03/17 1111  TROPONINI 2.54*  2.39* 4.86*    Glucose Recent Labs  Lab 07/04/17 1144 07/04/17 1525 07/04/17 2025 07/04/17 2348 07/05/17 0323 07/05/17 0725  GLUCAP 108* 113* 73 72 106* 101*    Imaging No results found.   STUDIES:  Echo 4/01 >> EF 60 to 65%, mod LVH, grade 1 DD, mild RV dilation, mild/mod RV systolic dysfx, small pericardial effusion EEG  4/01 >> diffuse slowing CT head 4/01 >> several small areas of hypoattenuation Rt frontal, Rt parietal, Rt occipital, Lt posterior temporal lobes, b/l cerebellar hemispheres CT chest 4/01 >> extensive b/l infiltrates, small Rt effusion, b/l anterior rib fxs, mid sternal fx Doppler legs 4/01 >> no DVT  CULTURES: Sputum 4/01 >>  ANTIBIOTICS: Zosyn 4/01 >>   SIGNIFICANT EVENTS: 3/28 Admit 4/01 VF cardiac arrest, neuro consulted 4/02 neuro s/o  LINES/TUBES: ETT 4/01 >> Rt radial aline 4/01 >>  Lt IJ CVL 4/01 >>   DISCUSSION: Oxygenation and hemodynamics continue to improve.  Mental status improving also.  Not ready for vent weaning yet, but might be getting closer.  ASSESSMENT / PLAN:  Acute respiratory failure with hypoxia/hypercapnic. - full vent support - adjust PEEP/FiO2 to keep SpO2 > 92% - f/u  CXR  Anoxic encephalopathy. - improving - RASS goal 0 to -1  Aspiration pneumonia. - day 3 of zosyn  Shock. - cardiac after VF arrest and septic after aspiration pneumonia - pressors to keep MAP > 65  Relative adrenal insufficiency. - cortisol 11.4 from 4/01 - continue solu cortef while on pressors  VF cardiac arrest. Hx of CAD, HTN. - normal EF on Echo - continue ASA, amiodarone - will ask cardiology to assess now that she is showing improvement in mental status  Hyponatremia. - lasix 40 mg IV x one on 4/03 - f/u BMET - KVO IV fluids  Hypokalemia, hypomagnesemia. - replace as needed  Metabolic acidosis. - HCO3 d/c'ed from IV fluids 4/02  Moderate protein calorie malnutrition. - tube feeds  Ulcerated lipodermatosclerosis Lt leg. - wound care  Hyperglycemia.  - SSI  Anemia of critical illness and chronic disease. Mild thrombocytopenia. - f/u CBC  Stage IV NSCLC with brain metastasis s/p gamma knife at [facility]. - followed at [facility]  DVT prophylaxis - Lovenox SUP - protonix Nutrition - tube feeds Goals of care - DNR  Updated husband at bedside  CC time 22 minutes  Chesley Mires, MD [place] 07/05/2017, 7:56 AM Pager:  [phone] After 3pm call: [phone]

## 2017-07-06 ENCOUNTER — Inpatient Hospital Stay (HOSPITAL_COMMUNITY): Payer: BLUE CROSS/BLUE SHIELD

## 2017-07-06 LAB — GLUCOSE, CAPILLARY
GLUCOSE-CAPILLARY: 114 mg/dL — AB (ref 65–99)
Glucose-Capillary: 103 mg/dL — ABNORMAL HIGH (ref 65–99)
Glucose-Capillary: 114 mg/dL — ABNORMAL HIGH (ref 65–99)
Glucose-Capillary: 104 mg/dL — ABNORMAL HIGH (ref 65–99)
Glucose-Capillary: 91 mg/dL (ref 65–99)
Glucose-Capillary: 112 mg/dL — ABNORMAL HIGH (ref 65–99)

## 2017-07-06 LAB — BASIC METABOLIC PANEL
ANION GAP: 7 (ref 5–15)
BUN: 19 mg/dL (ref 6–20)
CHLORIDE: 100 mmol/L — AB (ref 101–111)
CO2: 31 mmol/L (ref 22–32)
Calcium: 8.1 mg/dL — ABNORMAL LOW (ref 8.9–10.3)
Creatinine, Ser: 0.77 mg/dL (ref 0.44–1.00)
GFR calc non Af Amer: >60 mL/min (ref >60)
Glucose, Bld: 131 mg/dL — ABNORMAL HIGH (ref 65–99)
POTASSIUM: 3.4 mmol/L — AB (ref 3.5–5.1)
Sodium: 138 mmol/L (ref 135–145)

## 2017-07-06 LAB — CBC
HEMATOCRIT: 23.3 % — AB (ref 36.0–46.0)
Hemoglobin: 7.4 g/dL — ABNORMAL LOW (ref 12.0–15.0)
MCH: 29.6 pg (ref 26.0–34.0)
MCHC: 31.8 g/dL (ref 30.0–36.0)
MCV: 93.2 fL (ref 78.0–100.0)
Platelets: 115 10*3/uL — ABNORMAL LOW (ref 150–400)
RBC: 2.5 MIL/uL — AB (ref 3.87–5.11)
RDW: 15.1 % (ref 11.5–15.5)
WBC: 6.7 10*3/uL (ref 4.0–10.5)

## 2017-07-06 MED ORDER — MAGNESIUM SULFATE 2 GM/50ML IV SOLN
2.0000 g | Freq: Once | INTRAVENOUS | Status: AC
  Administered 2017-07-06: 2 g via INTRAVENOUS
  Filled 2017-07-06: qty 50

## 2017-07-06 MED ORDER — FENTANYL CITRATE (PF) 100 MCG/2ML IJ SOLN
50.0000 ug | INTRAMUSCULAR | Status: DC | PRN
  Administered 2017-07-06 – 2017-07-12 (×4): 50 ug via INTRAVENOUS
  Administered 2017-07-14: 25 ug via INTRAVENOUS
  Administered 2017-07-15 – 2017-07-16 (×2): 50 ug via INTRAVENOUS
  Filled 2017-07-06 (×4): qty 2

## 2017-07-06 MED ORDER — FENTANYL BOLUS VIA INFUSION
25.0000 ug | INTRAVENOUS | Status: DC | PRN
  Administered 2017-07-06 – 2017-07-12 (×16): 25 ug via INTRAVENOUS
  Filled 2017-07-06: qty 25

## 2017-07-06 MED ORDER — FUROSEMIDE 10 MG/ML IJ SOLN
40.0000 mg | Freq: Four times a day (QID) | INTRAMUSCULAR | Status: AC
  Administered 2017-07-06 (×3): 40 mg via INTRAVENOUS
  Filled 2017-07-06 (×3): qty 4

## 2017-07-06 MED ORDER — SERTRALINE HCL 100 MG PO TABS
100.0000 mg | ORAL_TABLET | Freq: Every day | ORAL | Status: DC
  Administered 2017-07-06 – 2017-07-15 (×9): 100 mg
  Filled 2017-07-06 (×11): qty 1

## 2017-07-06 MED ORDER — SENNOSIDES 8.8 MG/5ML PO SYRP
15.0000 mL | ORAL_SOLUTION | Freq: Every day | ORAL | Status: DC
  Filled 2017-07-06 (×3): qty 15

## 2017-07-06 MED ORDER — SODIUM CHLORIDE 0.9 % IV SOLN: 1.2500 ng/kg/min | INTRAVENOUS | Status: DC

## 2017-07-06 MED ORDER — SODIUM CHLORIDE 0.9 % IV SOLN
25.0000 ug/h | INTRAVENOUS | Status: DC
  Filled 2017-07-06: qty 50

## 2017-07-06 MED ORDER — POTASSIUM CHLORIDE 20 MEQ/15ML (10%) PO SOLN
40.0000 meq | ORAL | Status: AC
  Administered 2017-07-06 (×3): 40 meq
  Filled 2017-07-06 (×3): qty 30

## 2017-07-06 MED ORDER — FENTANYL 2500MCG IN NS 250ML (10MCG/ML) PREMIX INFUSION
25.0000 ug/h | INTRAVENOUS | Status: DC
  Administered 2017-07-06: 50 ug/h via INTRAVENOUS
  Administered 2017-07-07: 150 ug/h via INTRAVENOUS
  Administered 2017-07-07: 250 ug/h via INTRAVENOUS
  Administered 2017-07-08: 300 ug/h via INTRAVENOUS
  Administered 2017-07-08: 200 ug/h via INTRAVENOUS
  Administered 2017-07-09 (×2): 250 ug/h via INTRAVENOUS
  Administered 2017-07-09: 25 ug/h via INTRAVENOUS
  Administered 2017-07-10 (×2): 250 ug/h via INTRAVENOUS
  Administered 2017-07-10: 275 ug/h via INTRAVENOUS
  Administered 2017-07-11 – 2017-07-12 (×3): 300 ug/h via INTRAVENOUS
  Administered 2017-07-12: 125 ug/h via INTRAVENOUS
  Filled 2017-07-06 (×17): qty 250

## 2017-07-06 MED ORDER — FENTANYL CITRATE (PF) 100 MCG/2ML IJ SOLN
50.0000 ug | Freq: Once | INTRAMUSCULAR | Status: AC
  Administered 2017-07-06: 50 ug via INTRAVENOUS
  Filled 2017-07-06: qty 2

## 2017-07-06 MED ORDER — DEXMEDETOMIDINE HCL IN NACL 200 MCG/50ML IV SOLN
0.0000 ug/kg/h | INTRAVENOUS | Status: DC
  Administered 2017-07-06: 0.8 ug/kg/h via INTRAVENOUS
  Administered 2017-07-06: 0.4 ug/kg/h via INTRAVENOUS
  Filled 2017-07-06 (×2): qty 50

## 2017-07-06 MED ORDER — DEXMEDETOMIDINE HCL IN NACL 400 MCG/100ML IV SOLN
0.4000 ug/kg/h | INTRAVENOUS | Status: AC
  Administered 2017-07-06 (×2): 0.9 ug/kg/h via INTRAVENOUS
  Administered 2017-07-06 – 2017-07-07 (×3): 1.2 ug/kg/h via INTRAVENOUS
  Administered 2017-07-07: 0.876 ug/kg/h via INTRAVENOUS
  Administered 2017-07-07: 0.9 ug/kg/h via INTRAVENOUS
  Administered 2017-07-07: 1.2 ug/kg/h via INTRAVENOUS
  Administered 2017-07-07: 0.876 ug/kg/h via INTRAVENOUS
  Administered 2017-07-08 (×4): 1.2 ug/kg/h via INTRAVENOUS
  Administered 2017-07-08: 1 ug/kg/h via INTRAVENOUS
  Administered 2017-07-08 – 2017-07-09 (×5): 1.2 ug/kg/h via INTRAVENOUS
  Filled 2017-07-06 (×19): qty 100

## 2017-07-06 MED ORDER — GABAPENTIN 250 MG/5ML PO SOLN
450.0000 mg | Freq: Every day | ORAL | Status: DC
  Administered 2017-07-06 – 2017-07-15 (×9): 450 mg
  Filled 2017-07-06 (×15): qty 10

## 2017-07-06 MED ORDER — POTASSIUM CHLORIDE 20 MEQ/15ML (10%) PO SOLN: 40.0000 meq | Freq: Once | ORAL | Status: DC

## 2017-07-06 MED ORDER — PANTOPRAZOLE 20 MG TABLET,DELAYED RELEASE
ORAL_TABLET | 0 refills | 0 days | Status: CP
Start: 2017-07-06 — End: ?

## 2017-07-06 NOTE — Progress Notes (Addendum)
PULMONARY / CRITICAL CARE MEDICINE   Name: Jennifer Delgado MRN: [id number] DOB: 02/09/1951    ADMISSION DATE:  06/29/2017 CONSULTATION DATE:  07/03/2017  REFERRING MD:  Dr. Maylene Roes, [facility]  CHIEF COMPLAINT:  Cardiac arrest  HISTORY OF PRESENT ILLNESS:   67 yo female with nausea and vomiting.  She has hx of Stage IV NSCLC with brain metastases, ulcerated lipodermatosclerosis.  Found to have hyponatremia from hypovolemia with sodium of 116.  She developed cardiac arrest from VF 4/01, had ROSC after 30 minutes and transferred to ICU.  Found to have multiple infarcts on CT head, NSTEMI, aspiration pneumonia.  SUBJECTIVE:  More awake   VITAL SIGNS: Blood Pressure (Abnormal) 122/51   Pulse (Abnormal) 59   Temperature (Abnormal) 97.5 F (36.4 C)   Respiration 13   Height 5\' 5"  (1.651 m)   Weight 251 lb 12.3 oz (114.2 kg)   Oxygen Saturation 93%   Body Mass Index 41.90 kg/m   HEMODYNAMICS:    VENTILATOR SETTINGS: Vent Mode: PRVC FiO2 (%):  [30 %-40 %] 30 % Set Rate:  [16 bmp] 16 bmp Vt Set:  [460 mL] 460 mL PEEP:  [5 cmH20] 5 cmH20 Plateau Pressure:  [10 cmH20-34 cmH20] 13 cmH20  INTAKE / OUTPUT:  Intake/Output Summary (Last 24 hours) at 07/06/2017 0836 Last data filed at 07/06/2017 0700 Gross per 24 hour  Intake 2945.09 ml  Output 1950 ml  Net 995.09 ml     PHYSICAL EXAMINATION:  General - more awake. Nods appropriately but very weak Eyes - equal and reactive non-injected ENT - MMM orally intubated Cardiac - RRR w/out MRG Chest - equal chest rise. Clear  Abd - soft not tender + bowel sounds Ext - diffuse anasarca Skin -LL wrapped scattered areas of ecchymosis  Neuro - f/c tries to nod appropriately  LABS:  BMET Recent Labs  Lab 07/05/17 0600 07/05/17 1800 07/06/17 0403  NA 129* 132* 138  K 2.9* 4.0 3.4*  CL 94* 96* 100*  CO2 25 26 31   BUN 13 17 19   CREATININE 0.83 0.82 0.77  GLUCOSE 123* 133* 131*    Electrolytes Recent Labs  Lab 07/04/17 1827   07/05/17 0600 07/05/17 1800 07/06/17 0403  CALCIUM  --    < > 7.6* 7.8* 8.1*  MG 2.0  --  2.0 1.8  --   PHOS 3.8  --  3.3 3.0  --    < > = values in this interval not displayed.    CBC Recent Labs  Lab 07/04/17 0630 07/05/17 0600 07/06/17 0403  WBC 15.2* 10.3 6.7  HGB 9.3* 7.9* 7.4*  HCT 27.1* 23.0* 23.3*  PLT 136* 128* 115*    Coag's No results for input(s): APTT, INR in the last 168 hours.  Sepsis Markers Recent Labs  Lab 07/03/17 0314 07/03/17 0527 07/04/17 0630  LATICACIDVEN 2.5* 2.0* 0.9    ABG Recent Labs  Lab 07/03/17 1927 07/04/17 0451 07/05/17 0400  PHART 7.344* 7.452* 7.485*  PCO2ART 45.2 34.2 34.8  PO2ART 178* 142* 148*    Liver Enzymes Recent Labs  Lab 06/29/17 2320 07/03/17 0300 07/04/17 0630  AST 32 212* 50*  ALT 12* 93* 45  ALKPHOS 106 172* 104  BILITOT 0.9 0.5 0.4  ALBUMIN 3.5 2.8* 2.2*    Cardiac Enzymes Recent Labs  Lab 07/03/17 0314 07/03/17 1111  TROPONINI 2.54*  2.39* 4.86*    Glucose Recent Labs  Lab 07/05/17 1136 07/05/17 1721 07/05/17 1934 07/05/17 2328 07/06/17 0310 07/06/17 0752  GLUCAP  111* 112* 107* 99 103* 114*    Imaging Dg Chest Port 1 View  Result Date: 07/06/2017 CLINICAL DATA:  Respiratory failure EXAM: PORTABLE CHEST 1 VIEW COMPARISON:  07/05/2017 FINDINGS: Endotracheal tube in good position. Left jugular catheter tip in the SVC unchanged. NG in the stomach. Diffuse bilateral airspace disease unchanged. Progression of right lower lobe atelectasis/infiltrate. No change in left lower lobe atelectasis. IMPRESSION: Diffuse bilateral airspace disease unchanged most likely edema Bibasilar atelectasis. Progression of right lower lobe atelectasis and right effusion. Electronically Signed   By: Franchot Gallo M.D.   On: 07/06/2017 07:47     STUDIES:  Echo 4/01 >> EF 60 to 65%, mod LVH, grade 1 DD, mild RV dilation, mild/mod RV systolic dysfx, small pericardial effusion EEG 4/01 >> diffuse slowing CT head  4/01 >> several small areas of hypoattenuation Rt frontal, Rt parietal, Rt occipital, Lt posterior temporal lobes, b/l cerebellar hemispheres CT chest 4/01 >> extensive b/l infiltrates, small Rt effusion, b/l anterior rib fxs, mid sternal fx Doppler legs 4/01 >> no DVT  CULTURES: Sputum 4/01 >>  ANTIBIOTICS: Zosyn 4/01 >>   SIGNIFICANT EVENTS: 3/28 Admit 4/01 VF cardiac arrest, neuro consulted 4/02 neuro s/o  LINES/TUBES: ETT 4/01 >> Rt radial aline 4/01 >>  Lt IJ CVL 4/01 >>   DISCUSSION: Oxygenation and hemodynamics continue to improve.  Mental status improving also.  Not ready for vent weaning yet, but might be getting closer.  ASSESSMENT / PLAN: Resolved problems Metabolic acidosis Cardiogenic and septic shock  Active problems  Acute respiratory failure with hypoxia/hypercapnic.  Aeration a little worse w/ increased Right sided volume loss (mix of atx and effusion) also patchy changes on left appear a little worse. Has had persistent right HD elevation  -cont full vent support -lasix aiming for negative volume balance -wean PEEP/FIO2 -will eval right chest w/ [place] re: effusion either today or tomorrow Get her OOB  Aspiration pneumonia. Growing mod Grp b strep -day 4 zosyn   VF cardiac arrest. NSTEMI Hx of CAD, HTN. normal EF on Echo Seen by cards: raising question of malignant infiltration of myocardium  -cont asa -cont IV amio for now -consider cardiac MRI at some point -cont medical rx. Defer cath cath at this point   Relative adrenal insufficiency. cortisol 11.4 from 4/01 -dc stress dose steroids   Anoxic encephalopathy. Looking better -dc diprivan  -RAS goal 0 to -1 -stop versed   Fluid and electrolyte imbalance: Hyponatremia, Hypokalemia, hypomagnesemia. Lasix 40 mg IV x one on 4/03 She is > 12 liters + Na stable -lasix today & push this as long as cr can tolerate -replace K and Mg -am chemistry  -[facility]  Moderate protein calorie  malnutrition -cont tubefeeds  Ulcerated lipodermatosclerosis Lt leg. -cont wound care  Hyperglycemia.  -ssi   Anemia of critical illness and chronic disease. Mild thrombocytopenia. -trend cbc -cont LMWH  Stage IV NSCLC with brain metastasis s/p gamma knife at [facility]. Known mets to bilateral bilateral supraclavicular, mediastinal, and R hilar node, T7 and T10, and right pleural fluid and liver -review of her last notes suggest progression of disease; on Lorlatinib since feb 21 -f/u [facility]    DVT prophylaxis - Lovenox SUP - protonix Nutrition - tube feeds Goals of care - DNR   Erick Colace ACNP-BC [place] Pager # [phone] OR # [phone] if no answer

## 2017-07-06 NOTE — Progress Notes (Addendum)
Progress Note  Patient Name: Jennifer Delgado Date of Encounter: 07/06/2017  Primary Cardiologist: Sanda Klein, MD   Subjective   Sedated, using more sedation.  Rare PVC  Inpatient Medications    Scheduled Meds: . aspirin  81 mg Per Tube Daily  . chlorhexidine  15 mL Mouth Rinse BID  . Chlorhexidine Gluconate Cloth  6 each Topical Daily  . enoxaparin (LOVENOX) injection  40 mg Subcutaneous Q24H  . feeding supplement (PRO-STAT SUGAR FREE 64)  60 mL Per Tube BID  . feeding supplement (VITAL HIGH PROTEIN)  1,000 mL Per Tube Q24H  . furosemide  40 mg Intravenous Q6H  . gabapentin  450 mg Per Tube QHS  . insulin aspart  3-9 Units Subcutaneous Q4H  . mouth rinse  15 mL Mouth Rinse q12n4p  . multivitamin  15 mL Per Tube Daily  . pantoprazole sodium  40 mg Per Tube Q24H  . potassium chloride  40 mEq Per Tube Q4H  . sennosides  15 mL Per Tube QHS  . sertraline  100 mg Per Tube QHS   Continuous Infusions: . sodium chloride 10 mL/hr at 07/05/17 0809  . amiodarone 30 mg/hr (07/06/17 1149)  . dexmedetomidine (PRECEDEX) IV infusion 1.2 mcg/kg/hr (07/06/17 1337)  . fentaNYL infusion INTRAVENOUS 50 mcg/hr (07/06/17 1327)  . piperacillin-tazobactam (ZOSYN)  IV 3.375 g (07/06/17 1331)   PRN Meds: acetaminophen **OR** acetaminophen, bisacodyl, diclofenac sodium, docusate, fentaNYL, fentaNYL (SUBLIMAZE) injection, ipratropium-albuterol, sodium chloride flush   Vital Signs    Vitals:   07/06/17 1147 07/06/17 1150 07/06/17 1200 07/06/17 1300  BP: (!) 161/64 (!) 161/64  (!) 131/49  Pulse: 74 72 79 67  Resp: (!) 25 (!) 25 (!) 34 19  Temp: 98.6 F (37 C)  98.8 F (37.1 C) 99 F (37.2 C)  TempSrc:      SpO2: 94% 96% (!) 88% 94%  Weight:      Height:        Intake/Output Summary (Last 24 hours) at 07/06/2017 1523 Last data filed at 07/06/2017 1500 Gross per 24 hour  Intake 3814.25 ml  Output 3450 ml  Net 364.25 ml   Filed Weights   06/30/17 0400 06/30/17 0427 07/05/17 1500    Weight: 231 lb 11.3 oz (105.1 kg) 231 lb 11.3 oz (105.1 kg) 251 lb 12.3 oz (114.2 kg)    Telemetry    SR with 1 PVC - Personally Reviewed  ECG    No new- Personally Reviewed  Physical Exam  Per Dr. Sallyanne Kuster GEN: No acute distress.  Sedated and intubated Neck: No JVD Cardiac: RRR, no murmurs, rubs, or gallops.  Respiratory: Clear to auscultation bilaterally. GI: Soft, nontender, non-distended - feeding tube  MS: No edema; No deformity. Neuro: sedated, unable to assess  Psych: sedated   Labs    Chemistry Recent Labs  Lab 06/29/17 2320  07/03/17 0300  07/04/17 0630  07/05/17 0600 07/05/17 1800 07/06/17 0403  NA 116*   < > 126*   < >  --    < > 129* 132* 138  K 4.0   < > 3.7   < >  --    < > 2.9* 4.0 3.4*  CL 86*   < > 97*   < >  --    < > 94* 96* 100*  CO2 19*   < > 17*   < >  --    < > 25 26 31   GLUCOSE 92   < > 324*   < >  --    < >  123* 133* 131*  BUN 7   < > 5*   < >  --    < > 13 17 19   CREATININE 0.58   < > 0.76   < >  --    < > 0.83 0.82 0.77  CALCIUM 8.3*   < > 8.1*   < >  --    < > 7.6* 7.8* 8.1*  PROT 7.4  --  6.7  --  5.6*  --   --   --   --   ALBUMIN 3.5  --  2.8*  --  2.2*  --   --   --   --   AST 32  --  212*  --  50*  --   --   --   --   ALT 12*  --  93*  --  45  --   --   --   --   ALKPHOS 106  --  172*  --  104  --   --   --   --   BILITOT 0.9  --  0.5  --  0.4  --   --   --   --   GFRNONAA >60   < > >60   < >  --    < > >60 >60 >60  GFRAA >60   < > >60   < >  --    < > >60 >60 >60  ANIONGAP 11   < > 12   < >  --    < > 10 10 7    < > = values in this interval not displayed.     Hematology Recent Labs  Lab 07/04/17 0630 07/05/17 0600 07/06/17 0403  WBC 15.2* 10.3 6.7  RBC 3.07* 2.55* 2.50*  HGB 9.3* 7.9* 7.4*  HCT 27.1* 23.0* 23.3*  MCV 88.3 90.2 93.2  MCH 30.3 31.0 29.6  MCHC 34.3 34.3 31.8  RDW 14.7 15.2 15.1  PLT 136* 128* 115*    Cardiac Enzymes Recent Labs  Lab 07/03/17 0314 07/03/17 1111  TROPONINI 2.54*  2.39* 4.86*    No results for input(s): TROPIPOC in the last 168 hours.   BNPNo results for input(s): BNP, PROBNP in the last 168 hours.   DDimer No results for input(s): DDIMER in the last 168 hours.   Radiology    Dg Abd 1 View  Result Date: 07/06/2017 CLINICAL DATA:  67 year old female status post OG tube placement. EXAM: ABDOMEN - 1 VIEW COMPARISON:  07/03/2017.  Portable chest 0456 hours today. FINDINGS: Portable AP supine view at 1110 hours. Enteric tube courses to the left upper quadrant, tip at the level of the mid stomach, and the side hole projects at the level of the proximal gastric body. Paucity of bowel gas, nonobstructed gas pattern. Stable lung bases. No acute osseous abnormality identified. IMPRESSION: Enteric tube within the stomach, side hole at the level of the proximal gastric body. Electronically Signed   By: Genevie Ann M.D.   On: 07/06/2017 11:58   Dg Chest Port 1 View  Result Date: 07/06/2017 CLINICAL DATA:  Respiratory failure EXAM: PORTABLE CHEST 1 VIEW COMPARISON:  07/05/2017 FINDINGS: Endotracheal tube in good position. Left jugular catheter tip in the SVC unchanged. NG in the stomach. Diffuse bilateral airspace disease unchanged. Progression of right lower lobe atelectasis/infiltrate. No change in left lower lobe atelectasis. IMPRESSION: Diffuse bilateral airspace disease unchanged most likely edema Bibasilar atelectasis. Progression of right lower lobe atelectasis and  right effusion. Electronically Signed   By: Franchot Gallo M.D.   On: 07/06/2017 07:47   Dg Chest Port 1 View  Result Date: 07/05/2017 CLINICAL DATA:  Respiratory failure. EXAM: PORTABLE CHEST 1 VIEW COMPARISON:  One-view chest x-ray 07/04/2017 FINDINGS: Heart is enlarged. Endotracheal tube left IJ line and NG tube are stable. Interstitial edema and bilateral effusions are unchanged. Bibasilar airspace disease likely reflects atelectasis. IMPRESSION: 1. Stable appearance of congestive heart failure. 2. Support apparatus is  stable. Electronically Signed   By: San Morelle M.D.   On: 07/05/2017 08:58    Cardiac Studies   Study Conclusions  - Left ventricle: The cavity size was normal. Wall thickness was   increased in a pattern of moderate LVH. Systolic function was   normal. The estimated ejection fraction was in the range of 60%   to 65%. Wall motion was normal; there were no regional wall   motion abnormalities. Doppler parameters are consistent with   abnormal left ventricular relaxation (grade 1 diastolic   dysfunction). - Right ventricle: The cavity size was mildly dilated. Systolic   function was mildly to moderately reduced. - Pericardium, extracardiac: A small pericardial effusion was   identified. There was no evidence of hemodynamic compromise.   Features were not consistent with tamponade physiology.   Patient Profile     67 y.o. female with a hx of pulmonary edema after second chemo, with normal Echo, Metastatic lung cancer-stage IV-metatases to brain, sternum and spine,  ulcerated lipodermatosclerosis of LLE, HTN and now admitted for N&V, and na of 116.  4 days post admit with v. Fib arrest.  07/03/17, intubated and sedated.  EKG with inf wall ST elevation pk troponin of 4.86.   Assessment & Plan    Elevated troponin post v fib arrest with EKG changes.  No CAD noted on chest CTAs.  EF is normal.  Per Dr. Sallyanne Kuster "If we are to proceed to cardiac catheterization (with ensuing possible angioplasty and stent), I would want to know before the procedure whether or not her oncologist and/or neurology specialist think that dual antiplatelet can be safely given to this patient with brain metastases and recent gamma knife procedure."  V. Fib arrest now on amiodarone drip and 1 PVC noted. Continue amiodarone IV.   Cerebral infarctions on CT head suggests embolic source,  Neuro following.   Hx of SOB and seen by cards Jan 2019--imdur added   Stage IV NSCLC with brain metastases followed by  oncology at [facility]   Hypokalemia replacing by CCM  Anemia with hgb 7.4 today per CCM  Aspiration PNA , respiratory failure intubated and on vent since V fib arrest, on sedation with  Dexmedetomidine- increased dose, husband believes she is waking.  Per CCM   For questions or updates, please contact [facility] Please consult [email or web address] for contact info under Cardiology/STEMI.      Signed, Cecilie Kicks, NP  07/06/2017, 3:23 PM    I have seen and examined the patient along with Cecilie Kicks, NP .  I have reviewed the chart, notes and new data.  I agree with NP's note.  Key new complaints: She remains sedated and intubated.  Her husband reports that she has had some purposeful movements of her head and upper extremities. Key examination changes: Intubated, sedated, no abnormalities on cardiovascular exam Key new findings / data: Overnight telemetry shows extremely rare PVCs, no sustained ventricular arrhythmia.  Potassium 3.4, creatinine 0.77, hemoglobin 7.4.  PLAN: Until we get a  clear picture of her neurological recovery, plans for further cardiovascular evaluation are on hold.  If she does wake up a successfully extubated and has good neurological function, with plan for coronary angiography sometime next week. According to her husband, her oncologist has reported that her oncological prognosis with her particular lung cancer mutation is very good.  They think that that the disease can be kept at [place] for a long time.  Would also like the oncologist's opinion regarding the safety of dual antiplatelet therapy should percutaneous coronary revascularization and stenting become an option.  There is a plan for a brain MRI and I am sure the findings on that study would inform that decision to some degree.  Sanda Klein, MD, [place] [phone] 07/06/2017, 3:59 PM

## 2017-07-07 ENCOUNTER — Inpatient Hospital Stay (HOSPITAL_COMMUNITY): Payer: BLUE CROSS/BLUE SHIELD

## 2017-07-07 LAB — GLUCOSE, CAPILLARY
GLUCOSE-CAPILLARY: 102 mg/dL — AB (ref 65–99)
GLUCOSE-CAPILLARY: 93 mg/dL (ref 65–99)
GLUCOSE-CAPILLARY: 104 mg/dL — AB (ref 65–99)
GLUCOSE-CAPILLARY: 112 mg/dL — AB (ref 65–99)
GLUCOSE-CAPILLARY: 119 mg/dL — AB (ref 65–99)

## 2017-07-07 LAB — CBC
HEMATOCRIT: 25.3 % — AB (ref 36.0–46.0)
HEMOGLOBIN: 8.1 g/dL — AB (ref 12.0–15.0)
MCH: 30.5 pg (ref 26.0–34.0)
MCHC: 32 g/dL (ref 30.0–36.0)
MCV: 95.1 fL (ref 78.0–100.0)
Platelets: 148 10*3/uL — ABNORMAL LOW (ref 150–400)
RBC: 2.66 MIL/uL — ABNORMAL LOW (ref 3.87–5.11)
RDW: 15.3 % (ref 11.5–15.5)
WBC: 6.2 10*3/uL (ref 4.0–10.5)

## 2017-07-07 LAB — COMPREHENSIVE METABOLIC PANEL
ALK PHOS: 71 U/L (ref 38–126)
ALT: 17 U/L (ref 14–54)
ANION GAP: 9 (ref 5–15)
AST: 12 U/L — ABNORMAL LOW (ref 15–41)
Albumin: 2.3 g/dL — ABNORMAL LOW (ref 3.5–5.0)
BILIRUBIN TOTAL: 0.5 mg/dL (ref 0.3–1.2)
BUN: 22 mg/dL — ABNORMAL HIGH (ref 6–20)
CALCIUM: 8.1 mg/dL — AB (ref 8.9–10.3)
CO2: 34 mmol/L — ABNORMAL HIGH (ref 22–32)
Chloride: 97 mmol/L — ABNORMAL LOW (ref 101–111)
Creatinine, Ser: 0.83 mg/dL (ref 0.44–1.00)
Glucose, Bld: 115 mg/dL — ABNORMAL HIGH (ref 65–99)
POTASSIUM: 3.2 mmol/L — AB (ref 3.5–5.1)
Sodium: 140 mmol/L (ref 135–145)
TOTAL PROTEIN: 6.2 g/dL — AB (ref 6.5–8.1)

## 2017-07-07 LAB — MAGNESIUM: Magnesium: 1.8 mg/dL (ref 1.7–2.4)

## 2017-07-07 LAB — PHOSPHORUS: Phosphorus: 3 mg/dL (ref 2.5–4.6)

## 2017-07-07 MED ORDER — PHENYLEPHRINE HCL-NACL 10-0.9 MG/250ML-% IV SOLN
0.0000 ug/min | INTRAVENOUS | Status: DC
  Administered 2017-07-07: 20 ug/min via INTRAVENOUS
  Administered 2017-07-08: 6.667 ug/min via INTRAVENOUS
  Filled 2017-07-07 (×3): qty 250

## 2017-07-07 MED ORDER — MAGNESIUM SULFATE 2 GM/50ML IV SOLN
2.0000 g | Freq: Once | INTRAVENOUS | Status: AC
  Administered 2017-07-07: 2 g via INTRAVENOUS
  Filled 2017-07-07: qty 50

## 2017-07-07 MED ORDER — POTASSIUM CHLORIDE 20 MEQ/15ML (10%) PO SOLN
30.0000 meq | ORAL | Status: AC
  Administered 2017-07-07 (×2): 30 meq
  Filled 2017-07-07 (×2): qty 30

## 2017-07-07 MED ORDER — IPRATROPIUM-ALBUTEROL 0.5-2.5 (3) MG/3ML IN SOLN
3.0000 mL | RESPIRATORY_TRACT | Status: DC
  Administered 2017-07-07 – 2017-07-08 (×6): 3 mL via RESPIRATORY_TRACT
  Filled 2017-07-07 (×6): qty 3

## 2017-07-07 MED ORDER — SODIUM CHLORIDE 0.9 % IV SOLN
2.0000 g | INTRAVENOUS | Status: AC
  Administered 2017-07-07 – 2017-07-12 (×6): 2 g via INTRAVENOUS
  Filled 2017-07-07 (×6): qty 2

## 2017-07-07 MED ORDER — METOPROLOL TARTRATE 25 MG/10 ML ORAL SUSPENSION
12.5000 mg | Freq: Two times a day (BID) | ORAL | Status: DC
  Administered 2017-07-07 – 2017-07-09 (×4): 12.5 mg
  Filled 2017-07-07 (×5): qty 5

## 2017-07-07 MED ORDER — SODIUM CHLORIDE 0.9 % IV SOLN
25.0000 mg | Freq: Once | INTRAVENOUS | Status: AC
  Administered 2017-07-07: 25 mg via INTRAVENOUS
  Filled 2017-07-07: qty 1

## 2017-07-07 MED ORDER — METHYLPREDNISOLONE SODIUM SUCC 40 MG IJ SOLR
40.0000 mg | Freq: Two times a day (BID) | INTRAMUSCULAR | Status: DC
  Administered 2017-07-07 – 2017-07-09 (×5): 40 mg via INTRAVENOUS
  Filled 2017-07-07 (×5): qty 1

## 2017-07-07 MED ORDER — SODIUM CHLORIDE 0.9 % IV SOLN
0.0000 ug/min | INTRAVENOUS | Status: DC
  Filled 2017-07-07: qty 1

## 2017-07-07 MED ORDER — AMIODARONE HCL 200 MG PO TABS
400.0000 mg | ORAL_TABLET | Freq: Every day | ORAL | Status: DC
  Administered 2017-07-07 – 2017-07-15 (×9): 400 mg via NASOGASTRIC
  Filled 2017-07-07 (×10): qty 2

## 2017-07-07 MED ORDER — VITAL HIGH PROTEIN PO LIQD
1000.0000 mL | ORAL | Status: DC
  Administered 2017-07-07 – 2017-07-14 (×8): 1000 mL
  Filled 2017-07-07 (×12): qty 1000

## 2017-07-07 MED ORDER — FUROSEMIDE 10 MG/ML IJ SOLN
40.0000 mg | Freq: Four times a day (QID) | INTRAMUSCULAR | Status: AC
  Administered 2017-07-07 (×3): 40 mg via INTRAVENOUS
  Filled 2017-07-07 (×4): qty 4

## 2017-07-07 NOTE — Progress Notes (Signed)
[facility] ADULT ICU REPLACEMENT PROTOCOL FOR AM LAB REPLACEMENT ONLY  The patient does apply for the [facility] Adult ICU Electrolyte Replacment Protocol based on the criteria listed below:   1. Is GFR >/= 40 ml/min? Yes.    Patient's GFR today is >60 2. Is urine output >/= 0.5 ml/kg/hr for the last 6 hours? Yes.   Patient's UOP is 1.56 ml/kg/hr 3. Is BUN < 60 mg/dL? Yes.    Patient's BUN today is 22 4. Abnormal electrolyte(s): Potassium 3.2 5. Ordered repletion with: Potassium per protocol 6. If a panic level lab has been reported, has the CCM MD in charge been notified? No..   Physician:    Conley Canal P 07/07/2017 5:05 AM

## 2017-07-07 NOTE — Progress Notes (Addendum)
PULMONARY / CRITICAL CARE MEDICINE   Name: Jennifer Delgado MRN: [id number] DOB: 1950/09/30    ADMISSION DATE:  06/29/2017 CONSULTATION DATE:  07/03/2017  REFERRING MD:  Dr. Maylene Roes, [facility]  CHIEF COMPLAINT:  Cardiac arrest  HISTORY OF PRESENT ILLNESS:   67 yo female with nausea and vomiting.  She has hx of Stage IV NSCLC with brain metastases, ulcerated lipodermatosclerosis.  Found to have hyponatremia from hypovolemia with sodium of 116.  She developed cardiac arrest from VF 4/01, had ROSC after 30 minutes and transferred to ICU.  Found to have multiple infarcts on CT head, NSTEMI, aspiration pneumonia.  SUBJECTIVE:  Sedated  Developed acute hiccups when sedation let up   VITAL SIGNS: Blood Pressure 128/87 (BP Location: Right Arm)   Pulse 70   Temperature 99.5 F (37.5 C) (Core)   Respiration 19   Height 5\' 5"  (1.651 m)   Weight 246 lb 14.6 oz (112 kg)   Oxygen Saturation 97%   Body Mass Index 41.09 kg/m    HEMODYNAMICS:    VENTILATOR SETTINGS: Vent Mode: PRVC FiO2 (%):  [30 %-40 %] 40 % Set Rate:  [16 bmp] 16 bmp Vt Set:  [460 mL] 460 mL PEEP:  [5 cmH20] 5 cmH20 Plateau Pressure:  [16 cmH20-20 cmH20] 19 cmH20  INTAKE / OUTPUT:  Intake/Output Summary (Last 24 hours) at 07/07/2017 0933 Last data filed at 07/07/2017 8938 Gross per 24 hour  Intake 2613.3 ml  Output 6100 ml  Net -3486.7 ml     PHYSICAL EXAMINATION: General: 67 year old white female currently heavily sedated on the ventilator HEENT: Orally intubated, mucous membranes moist, no jugular venous distention Pulmonary: Diffuse rhonchi, currently with diaphragmatic spasm/hiccups triggering ventilator breaths appears uncomfortable  cardiac: Regular rate and rhythm no murmur rub or gallop Extremities: Warm, brisk cap refill, diffuse anasarca, strong pulses Abdomen: Soft nontender as noted above diaphragmatic spasms GU: Clear yellow Neuro: Currently heavily sedated, would furl her brow to  discomfort LABS:  BMET Recent Labs  Lab 07/05/17 1800 07/06/17 0403 07/07/17 0412  NA 132* 138 140  K 4.0 3.4* 3.2*  CL 96* 100* 97*  CO2 26 31 34*  BUN 17 19 22*  CREATININE 0.82 0.77 0.83  GLUCOSE 133* 131* 115*    Electrolytes Recent Labs  Lab 07/05/17 0600 07/05/17 1800 07/06/17 0403 07/07/17 0412  CALCIUM 7.6* 7.8* 8.1* 8.1*  MG 2.0 1.8  --  1.8  PHOS 3.3 3.0  --  3.0    CBC Recent Labs  Lab 07/05/17 0600 07/06/17 0403 07/07/17 0412  WBC 10.3 6.7 6.2  HGB 7.9* 7.4* 8.1*  HCT 23.0* 23.3* 25.3*  PLT 128* 115* 148*    Coag's No results for input(s): APTT, INR in the last 168 hours.  Sepsis Markers Recent Labs  Lab 07/03/17 0314 07/03/17 0527 07/04/17 0630  LATICACIDVEN 2.5* 2.0* 0.9    ABG Recent Labs  Lab 07/03/17 1927 07/04/17 0451 07/05/17 0400  PHART 7.344* 7.452* 7.485*  PCO2ART 45.2 34.2 34.8  PO2ART 178* 142* 148*    Liver Enzymes Recent Labs  Lab 07/03/17 0300 07/04/17 0630 07/07/17 0412  AST 212* 50* 12*  ALT 93* 45 17  ALKPHOS 172* 104 71  BILITOT 0.5 0.4 0.5  ALBUMIN 2.8* 2.2* 2.3*    Cardiac Enzymes Recent Labs  Lab 07/03/17 0314 07/03/17 1111  TROPONINI 2.54*  2.39* 4.86*    Glucose Recent Labs  Lab 07/06/17 1246 07/06/17 1654 07/06/17 2003 07/06/17 2324 07/07/17 0304 07/07/17 0844  GLUCAP 114* 104*  91 112* 102* 93    Imaging Dg Abd 1 View  Result Date: 07/06/2017 CLINICAL DATA:  67 year old female status post OG tube placement. EXAM: ABDOMEN - 1 VIEW COMPARISON:  07/03/2017.  Portable chest 0456 hours today. FINDINGS: Portable AP supine view at 1110 hours. Enteric tube courses to the left upper quadrant, tip at the level of the mid stomach, and the side hole projects at the level of the proximal gastric body. Paucity of bowel gas, nonobstructed gas pattern. Stable lung bases. No acute osseous abnormality identified. IMPRESSION: Enteric tube within the stomach, side hole at the level of the proximal  gastric body. Electronically Signed   By: Genevie Ann M.D.   On: 07/06/2017 11:58   Dg Chest Port 1 View  Result Date: 07/07/2017 CLINICAL DATA:  Hypoxia EXAM: PORTABLE CHEST 1 VIEW COMPARISON:  July 06, 2017 FINDINGS: Endotracheal tube tip is 3.9 cm above the carina. Left jugular catheter tip is at the junction of the left innominate vein and superior vena cava. Nasogastric tube tip and side port below the diaphragm. No pneumothorax. There are pleural effusions bilaterally, larger on the right than on the left. There is moderate interstitial and patchy alveolar edema, stable. There is cardiomegaly with pulmonary venous hypertension. No evident adenopathy. No bone lesions. IMPRESSION: Tube and catheter positions as described without pneumothorax. Pulmonary vascular congestion with pleural effusions bilaterally and diffuse pulmonary edema. Suspect underlying congestive heart failure. Cannot exclude superimposed pneumonia in areas of alveolar opacity. There may well be pneumonia and congestive heart failure present concurrently. Electronically Signed   By: Lowella Grip III M.D.   On: 07/07/2017 07:32     STUDIES:  Echo 4/01 >> EF 60 to 65%, mod LVH, grade 1 DD, mild RV dilation, mild/mod RV systolic dysfx, small pericardial effusion EEG 4/01 >> diffuse slowing CT head 4/01 >> several small areas of hypoattenuation Rt frontal, Rt parietal, Rt occipital, Lt posterior temporal lobes, b/l cerebellar hemispheres CT chest 4/01 >> extensive b/l infiltrates, small Rt effusion, b/l anterior rib fxs, mid sternal fx Doppler legs 4/01 >> no DVT  CULTURES: Sputum 4/01 >>mod group B strep   ANTIBIOTICS: Zosyn 4/01 >>   SIGNIFICANT EVENTS: 3/28 Admit 4/01 VF cardiac arrest, neuro consulted 4/02 neuro s/o  LINES/TUBES: ETT 4/01 >> Rt radial aline 4/01 >>  Lt IJ CVL 4/01 >>   DISCUSSION: Oxygenation and hemodynamics continue to improve.  Mental status improving also.  Not ready for vent weaning yet, but  might be getting closer.  ASSESSMENT / PLAN: Resolved problems Metabolic acidosis Cardiogenic and septic shock Relative adrenal insufficiency  Active problems  Acute respiratory failure with hypoxia/hypercapnic.  Portable chest x-ray personally reviewed: This demonstrates The endotracheal tube as well as central venous catheter in satisfactory position.  She has unchanged right greater than left airspace disease with significant component of right effusion -rx hiccups -Continuing full ventilator support -Continue aggressive diuresis; over 9 L positive. -hold off on thora for now; see if we can get more off w/ diuresis  -Daily assessment for readiness to wean; apparently agitation has been a significant factor; RASS goal is 0--1  Aspiration pneumonia. Growing mod Grp b strep -Day #5 of 7 Zosyn; change to rocephin   VF cardiac arrest. NSTEMI Hx of CAD, HTN. normal EF on Echo Seen by cards: raising question of malignant infiltration of myocardium  Think acute diastolic dysfunction was likely contributing factor to above -Continue aspirin -Continuing IV amiodarone -Consider cardiac MRI at some point -Cardiology  deferring catheterization at this point   Anoxic encephalopathy. Looking better; agitated at times. -PAD protocol -RASS goal 0--1 -PRN analgesia for chronic and acute pain   Fluid and electrolyte imbalance: Hyponatremia, Hypokalemia, hypomagnesemia. Sodium remains stable, -Continue to push Lasix -Replace potassium and magnesium -Follow-up chemistry in a.m.  Moderate protein calorie malnutrition -Continue tube feeds  Ulcerated lipodermatosclerosis Lt leg. -Continue wound care  Hyperglycemia.  -Sliding scale insulin  Anemia of critical illness and chronic disease. Mild thrombocytopenia. -Trend CBC -Continue low molecular weight heparin   Stage IV NSCLC with brain metastasis s/p gamma knife at [facility]. Known mets to bilateral bilateral supraclavicular,  mediastinal, and R hilar node, T7 and T10, and right pleural fluid and liver.  -review of her last notes suggest progression of disease; on Lorlatinib since feb 21 -speaking to [facility] oncology... They remain very optimistic about her cancer prognosis and approach this as a chronic disease.  -Follow-up at [facility]   DVT prophylaxis - Lovenox SUP - protonix Nutrition - tube feeds Goals of care - DNR   Erick Colace ACNP-BC [place] Pager # [phone] OR # [phone] if no answer

## 2017-07-07 NOTE — Progress Notes (Signed)
Progress Note  Patient Name: Jennifer Delgado Date of Encounter: 07/07/2017  Primary Cardiologist: Sanda Klein, MD   Subjective   Currently she is again sedated, but earlier today she was responsive and following commands.  It seems that she will have a good neurological recovery. Unfortunately, she is also now suffering from pain from what appears to be sternal fractures following her chest compressions during resuscitation. Rib fractures and potential aspiration pneumonia are expected to delay her extubation.  He does not have trouble oxygenating. No further rhythm abnormalities have been detected.  She has been receiving tube feeds now for roughly 48 hours.  Inpatient Medications    Scheduled Meds: . aspirin  81 mg Per Tube Daily  . chlorhexidine  15 mL Mouth Rinse BID  . Chlorhexidine Gluconate Cloth  6 each Topical Daily  . enoxaparin (LOVENOX) injection  40 mg Subcutaneous Q24H  . furosemide  40 mg Intravenous Q6H  . gabapentin  450 mg Per Tube QHS  . insulin aspart  3-9 Units Subcutaneous Q4H  . mouth rinse  15 mL Mouth Rinse q12n4p  . multivitamin  15 mL Per Tube Daily  . pantoprazole sodium  40 mg Per Tube Q24H  . potassium chloride  30 mEq Per Tube Q4H  . sennosides  15 mL Per Tube QHS  . sertraline  100 mg Per Tube QHS   Continuous Infusions: . sodium chloride 10 mL/hr at 07/07/17 1200  . amiodarone 30 mg/hr (07/07/17 1200)  . cefTRIAXone (ROCEPHIN)  IV 2 g (07/07/17 1345)  . dexmedetomidine (PRECEDEX) IV infusion 0.876 mcg/kg/hr (07/07/17 1200)  . feeding supplement (VITAL HIGH PROTEIN)    . fentaNYL infusion INTRAVENOUS 250 mcg/hr (07/07/17 1200)  . phenylephrine (NEO-SYNEPHRINE) Adult infusion     PRN Meds: acetaminophen **OR** acetaminophen, bisacodyl, diclofenac sodium, docusate, fentaNYL, fentaNYL (SUBLIMAZE) injection, ipratropium-albuterol, sodium chloride flush   Vital Signs    Vitals:   07/07/17 1200 07/07/17 1300 07/07/17 1335 07/07/17 1340  BP:  (!) 82/42 (!) 88/43 (!) 98/45 (!) 78/37  Pulse: 63 64 67 66  Resp: 19 19 19  (!) 21  Temp: 100 F (37.8 C) 99.9 F (37.7 C) 99.9 F (37.7 C) 99.9 F (37.7 C)  TempSrc: Core Core    SpO2: 93% 92% 92% 92%  Weight:      Height:        Intake/Output Summary (Last 24 hours) at 07/07/2017 1348 Last data filed at 07/07/2017 1200 Gross per 24 hour  Intake 2625.91 ml  Output 3750 ml  Net -1124.09 ml   Filed Weights   07/05/17 1500 07/06/17 2000 07/07/17 0349  Weight: 251 lb 12.3 oz (114.2 kg) 248 lb 7.3 oz (112.7 kg) 246 lb 14.6 oz (112 kg)    Telemetry    Sinus rhythm- Personally Reviewed  ECG    SR with Q waves and residual ST segment elevation in the inferior leads, prominent reciprocal ST segment depression and T wave inversion in the precordial leads with prolonged QTC around 500 ms- Personally Reviewed  Physical Exam  Currently sedated deeply.  She has been in a lot of pain.  Intubated/mechanically ventilated GEN: No acute distress.   Neck: No JVD Cardiac: RRR, no murmurs, rubs, or gallops.  Respiratory: Clear to auscultation bilaterally. GI: Soft, nontender, non-distended  MS: No edema; No deformity. Neuro:  Nonfocal  Psych: Normal affect   Labs    Chemistry Recent Labs  Lab 07/03/17 0300  07/04/17 0630  07/05/17 1800 07/06/17 0403 07/07/17 1610  NA 126*   < >  --    < > 132* 138 140  K 3.7   < >  --    < > 4.0 3.4* 3.2*  CL 97*   < >  --    < > 96* 100* 97*  CO2 17*   < >  --    < > 26 31 34*  GLUCOSE 324*   < >  --    < > 133* 131* 115*  BUN 5*   < >  --    < > 17 19 22*  CREATININE 0.76   < >  --    < > 0.82 0.77 0.83  CALCIUM 8.1*   < >  --    < > 7.8* 8.1* 8.1*  PROT 6.7  --  5.6*  --   --   --  6.2*  ALBUMIN 2.8*  --  2.2*  --   --   --  2.3*  AST 212*  --  50*  --   --   --  12*  ALT 93*  --  45  --   --   --  17  ALKPHOS 172*  --  104  --   --   --  71  BILITOT 0.5  --  0.4  --   --   --  0.5  GFRNONAA >60   < >  --    < > >60 >60 >60  GFRAA >60    < >  --    < > >60 >60 >60  ANIONGAP 12   < >  --    < > 10 7 9    < > = values in this interval not displayed.     Hematology Recent Labs  Lab 07/05/17 0600 07/06/17 0403 07/07/17 0412  WBC 10.3 6.7 6.2  RBC 2.55* 2.50* 2.66*  HGB 7.9* 7.4* 8.1*  HCT 23.0* 23.3* 25.3*  MCV 90.2 93.2 95.1  MCH 31.0 29.6 30.5  MCHC 34.3 31.8 32.0  RDW 15.2 15.1 15.3  PLT 128* 115* 148*    Cardiac Enzymes Recent Labs  Lab 07/03/17 0314 07/03/17 1111  TROPONINI 2.54*  2.39* 4.86*   No results for input(s): TROPIPOC in the last 168 hours.   BNPNo results for input(s): BNP, PROBNP in the last 168 hours.   DDimer No results for input(s): DDIMER in the last 168 hours.   Radiology    Dg Abd 1 View  Result Date: 07/06/2017 CLINICAL DATA:  67 year old female status post OG tube placement. EXAM: ABDOMEN - 1 VIEW COMPARISON:  07/03/2017.  Portable chest 0456 hours today. FINDINGS: Portable AP supine view at 1110 hours. Enteric tube courses to the left upper quadrant, tip at the level of the mid stomach, and the side hole projects at the level of the proximal gastric body. Paucity of bowel gas, nonobstructed gas pattern. Stable lung bases. No acute osseous abnormality identified. IMPRESSION: Enteric tube within the stomach, side hole at the level of the proximal gastric body. Electronically Signed   By: Genevie Ann M.D.   On: 07/06/2017 11:58   Dg Chest Port 1 View  Result Date: 07/07/2017 CLINICAL DATA:  Hypoxia EXAM: PORTABLE CHEST 1 VIEW COMPARISON:  July 06, 2017 FINDINGS: Endotracheal tube tip is 3.9 cm above the carina. Left jugular catheter tip is at the junction of the left innominate vein and superior vena cava. Nasogastric tube tip and side port below the diaphragm. No pneumothorax.  There are pleural effusions bilaterally, larger on the right than on the left. There is moderate interstitial and patchy alveolar edema, stable. There is cardiomegaly with pulmonary venous hypertension. No evident  adenopathy. No bone lesions. IMPRESSION: Tube and catheter positions as described without pneumothorax. Pulmonary vascular congestion with pleural effusions bilaterally and diffuse pulmonary edema. Suspect underlying congestive heart failure. Cannot exclude superimposed pneumonia in areas of alveolar opacity. There may well be pneumonia and congestive heart failure present concurrently. Electronically Signed   By: Lowella Grip III M.D.   On: 07/07/2017 07:32   Dg Chest Port 1 View  Result Date: 07/06/2017 CLINICAL DATA:  Respiratory failure EXAM: PORTABLE CHEST 1 VIEW COMPARISON:  07/05/2017 FINDINGS: Endotracheal tube in good position. Left jugular catheter tip in the SVC unchanged. NG in the stomach. Diffuse bilateral airspace disease unchanged. Progression of right lower lobe atelectasis/infiltrate. No change in left lower lobe atelectasis. IMPRESSION: Diffuse bilateral airspace disease unchanged most likely edema Bibasilar atelectasis. Progression of right lower lobe atelectasis and right effusion. Electronically Signed   By: Franchot Gallo M.D.   On: 07/06/2017 07:47    Cardiac Studies   07/04/17  Echo Study Conclusions  - Left ventricle: The cavity size was normal. Wall thickness was   increased in a pattern of moderate LVH. Systolic function was   normal. The estimated ejection fraction was in the range of 60%   to 65%. Wall motion was normal; there were no regional wall   motion abnormalities. Doppler parameters are consistent with   abnormal left ventricular relaxation (grade 1 diastolic   dysfunction). - Right ventricle: The cavity size was mildly dilated. Systolic   function was mildly to moderately reduced. - Pericardium, extracardiac: A small pericardial effusion was   identified. There was no evidence of hemodynamic compromise.   Features were not consistent with tamponade physiology.   Patient Profile     67 y.o. female with a hx of pulmonary edema after second chemo,  with normal Echo, Metastatic lung cancer-stage IV-metatases to brain, sternum and spine,ulcerated lipodermatosclerosis of LLE, HTNand now admitted for N&V, and na of 116.  4 days post admit with v. Fib arrest.  07/03/17, intubated and sedated.  EKG with inf wall ST elevation pk troponin of 4.86.   Assessment & Plan    STEMI - post V fib arrest    No CAD noted on chest CTAs.  EF is normal.  Per Dr. Sallyanne Kuster "If we are to proceed to cardiac catheterization (with ensuing possible angioplasty and stent), I would want to know before the procedure whether or not her oncologist and/or neurology specialist think that dual antiplatelet can be safely given to this patient with brain metastases and recent gamma knife procedure." Note what appears to be synchronous embolic strokes involving bilateral anterior and posterior cerebral circulation.  This raises the possibility that her infarction may also have been embolic, although the source of embolism would be very unclear.  She does not have a history of atrial fibrillation and does not have a left ventricular thrombus or depressed LVEF to have atypical source of cardiac embolism. The family would like to proceed with further evaluation as to the cause of her infarction and ventricular fibrillation.  This will involve coronary angiography as a first step.  I do not think we will plan to do this procedure until the patient is clearly alert and preferably extubated so that we can discuss the pros and cons of this procedure with her.  V fib  arrest on amiodarone drip ; appears to have reliable enteral nutrition, will switch to amiodarone via NG tube.  Cerebral infarctions on CT head suggests embolic source,  neuro has seen.  Source of embolic events is uncertain.  Consider transesophageal echocardiography while still intubated. Stage IV NSCLC with brain metastases followed by oncology at [facility]   Per CCM "- d/w Dr. Elmon Kirschner at [facility] - pt has experienced  good response to therapy, and Dr. Truman Hayward felt she had good prognosis for controlling her cancer - Dr. Truman Hayward did not think that pt's hx of lung cancer should preclude her from continuing current interventions, including additional cardiac w/u - will need to resume her lorlatinib after further clinical improvement" Try to call Dr. Truman Hayward today and discuss the safety of dual antiplatelet therapy, should we go down the route of cardiac catheterization.  Anemia with hgb 7.4 today per CCM  Aspiration PNA , respiratory failure intubated and on vent since V fib arrest, on sedation with  Dexmedetomidine- increased dose, husband believes she is waking.  Per CCM   Sternal fractures after CPR    For questions or updates, please contact [place] Please consult [email or web address] for contact info under Cardiology/STEMI.   Sanda Klein, MD, [facility] [phone] office [phone] pager

## 2017-07-07 NOTE — Progress Notes (Signed)
Nutrition Follow-up  DOCUMENTATION CODES:   Obesity unspecified  INTERVENTION:  - Will adjust TF regimen: Vital High Protein @ 55 mL/hr. This regimen will provide 1320 kcal, 115 grams of protein, and and 1103 mL free water. - PEPuP protocol.   NUTRITION DIAGNOSIS:   Inadequate oral intake related to inability to eat as evidenced by NPO status.  GOAL:   Provide needs based on ASPEN/SCCM guidelines -met with TF regimen.  MONITOR:   TF tolerance, Vent status, Weight trends, Labs, Skin  ASSESSMENT:   67 yr old female with PMHx of NSCLS with mets to the brain, spine and liver s/p Gamma knife at [facility] on 3/26 and 3/27 presented to [facility] with nausea and vomiting accompanied by HA and SOB. Admitted to Hospitalist service for hypovolemic hyponatremia.  Weight trending up since admission with current weight being +14 lbs/6.4 kg compared to admission weight. Pt remains intubated, sedated, with OGT in place. She is currently receiving Vital High Protein @ 30 mL/hr with 60 mL Prostat BID. This regimen is providing 1120 kcal, 123 grams of protein, and 602 mL free water.   Per Pete's note this AM: continue full vent support at this time, failing SBTs, attempt for additional fluid removal with diuresis and hold on thoracentesis at this time, aspiration PNA on day 5 of antibiotics, s/p VF cardiac arrest with NSTEMI, anoxic encephalopathy, fluid and electrolyte imbalance, moderate protein calorie malnutrition (RD unable to identify malnutrition based on ASPEN guidelines), ulceration to L leg, stage 4 NSCLC with brain, bone, and liver mets who is followed at [facility].   Patient is currently intubated on ventilator support MV: 7.8 L/min Temp (24hrs), Avg:99.1 F (37.3 C), Min:98.1 F (36.7 C), Max:100 F (37.8 C) Propofol: none BP: 82/42 and MAP: 45  Medications reviewed; 40 mg IV Lasix QID, sliding scale Novolog, 2 g IV Mg sulfate x1 run today, 15 mL liquid multivitamin per OGT/day, 40 mg Protonix per  OGT/day, 30 mEq KCl per OGT x2 today, 40 mEq KCl per OGT x3 doses yesterday, 15 mL Senokot per OGT/day. Labs reviewed; CBGs: 102 and 93 mg/dL this AM, K: 3.2 mmol/L, Cl: 97 mmol/L, Ca: 8.1 mg/dL.  Drips: Amiodarone @ 30 mg/hr, Precedex @ 0.95 mcg/kg/hr, Fentanyl @ 250 mcg/hr.   Diet Order:  Diet NPO time specified  EDUCATION NEEDS:   No education needs have been identified at this time  Skin:  Skin Assessment: Skin Integrity Issues: Skin Integrity Issues:: Other (Comment) Other: non-pressure injury to L leg  Last BM:  4/5  Height:   Ht Readings from Last 1 Encounters:  06/30/17 5' 5"  (1.651 m)    Weight:   Wt Readings from Last 1 Encounters:  07/07/17 246 lb 14.6 oz (112 kg)    Ideal Body Weight:  56.82 kg  BMI:  Body mass index is 41.09 kg/m.  Estimated Nutritional Needs:   Kcal:  0034-9179 (11-14 kcal/kg)  Protein:  >/= 114 grams (2 grams/kg IBW)  Fluid:  >/= 1.5 L/day      Jarome Matin, MS, RD, LDN, [facility] Inpatient Clinical Dietitian Pager # [phone] After hours/weekend pager # [phone]

## 2017-07-07 NOTE — Progress Notes (Signed)
Pt's b/p dropping to 70's/30's, Marni Griffon NP notified and neosynephrine started

## 2017-07-07 NOTE — Consult Note (Signed)
[place] Nurse wound consult note Reason for Consult: DTI inner lower lip from ETT Wound type: Device related DTI from ETT Pressure Injury POA: No Measurement: approximately 1 cm x 0.6 cm Wound bed: Dark purple Drainage (amount, consistency, odor) None Periwound: intact, normal color and consistency Dressing procedure/placement/frequency: Dressing is not required.  No local treatment recommended at this time other than continued assessment and ensure RT knows and can participate in rotating tubes. Thank you for the consult.  Discussed plan of care with the patient and bedside nurse.  [place] nurse will not follow at this time.  Please re-consult the [place] team if needed.  Val Riles, RN, MSN, CWOCN, CNS-BC, pager [phone]

## 2017-07-08 ENCOUNTER — Inpatient Hospital Stay (HOSPITAL_COMMUNITY): Payer: BLUE CROSS/BLUE SHIELD

## 2017-07-08 DIAGNOSIS — J9601 Acute respiratory failure with hypoxia: Secondary | ICD-10-CM

## 2017-07-08 LAB — GLUCOSE, CAPILLARY
GLUCOSE-CAPILLARY: 130 mg/dL — AB (ref 65–99)
GLUCOSE-CAPILLARY: 144 mg/dL — AB (ref 65–99)
GLUCOSE-CAPILLARY: 125 mg/dL — AB (ref 65–99)
Glucose-Capillary: 121 mg/dL — ABNORMAL HIGH (ref 65–99)
Glucose-Capillary: 123 mg/dL — ABNORMAL HIGH (ref 65–99)
Glucose-Capillary: 126 mg/dL — ABNORMAL HIGH (ref 65–99)

## 2017-07-08 LAB — COMPREHENSIVE METABOLIC PANEL
ALK PHOS: 63 U/L (ref 38–126)
ALT: 15 U/L (ref 14–54)
AST: 14 U/L — AB (ref 15–41)
Albumin: 2.3 g/dL — ABNORMAL LOW (ref 3.5–5.0)
Anion gap: 9 (ref 5–15)
BILIRUBIN TOTAL: 0.4 mg/dL (ref 0.3–1.2)
BUN: 30 mg/dL — AB (ref 6–20)
CALCIUM: 7.4 mg/dL — AB (ref 8.9–10.3)
CO2: 29 mmol/L (ref 22–32)
CREATININE: 0.83 mg/dL (ref 0.44–1.00)
Chloride: 103 mmol/L (ref 101–111)
GFR calc Af Amer: >60 mL/min (ref >60)
Glucose, Bld: 135 mg/dL — ABNORMAL HIGH (ref 65–99)
Potassium: 4.3 mmol/L (ref 3.5–5.1)
Sodium: 141 mmol/L (ref 135–145)
Total Protein: 6.4 g/dL — ABNORMAL LOW (ref 6.5–8.1)

## 2017-07-08 LAB — CBC
HCT: 24.5 % — ABNORMAL LOW (ref 36.0–46.0)
Hemoglobin: 7.5 g/dL — ABNORMAL LOW (ref 12.0–15.0)
MCH: 30.2 pg (ref 26.0–34.0)
MCHC: 30.6 g/dL (ref 30.0–36.0)
MCV: 98.8 fL (ref 78.0–100.0)
PLATELETS: 188 10*3/uL (ref 150–400)
RBC: 2.48 MIL/uL — ABNORMAL LOW (ref 3.87–5.11)
RDW: 16 % — AB (ref 11.5–15.5)
WBC: 8.8 10*3/uL (ref 4.0–10.5)

## 2017-07-08 LAB — MAGNESIUM: Magnesium: 1.9 mg/dL (ref 1.7–2.4)

## 2017-07-08 MED ORDER — GERHARDT'S BUTT CREAM
TOPICAL_CREAM | Freq: Two times a day (BID) | CUTANEOUS | Status: AC
  Administered 2017-07-08: 22:00:00 via TOPICAL
  Administered 2017-07-08 – 2017-07-09 (×2): 1 via TOPICAL
  Administered 2017-07-09 – 2017-07-10 (×2): via TOPICAL
  Administered 2017-07-10: 1 via TOPICAL
  Administered 2017-07-11 – 2017-07-12 (×4): via TOPICAL
  Filled 2017-07-08: qty 1

## 2017-07-08 MED ORDER — IPRATROPIUM-ALBUTEROL 0.5-2.5 (3) MG/3ML IN SOLN
3.0000 mL | Freq: Four times a day (QID) | RESPIRATORY_TRACT | Status: DC
  Administered 2017-07-08 – 2017-07-09 (×3): 3 mL via RESPIRATORY_TRACT
  Filled 2017-07-08 (×3): qty 3

## 2017-07-08 NOTE — Plan of Care (Signed)
Pt has foley catheter in place at this time. Pt having daily bowel movements

## 2017-07-08 NOTE — Progress Notes (Signed)
PULMONARY / CRITICAL CARE MEDICINE   Name: Jennifer Delgado MRN: [id number] DOB: 08/09/1950    ADMISSION DATE:  06/29/2017 CONSULTATION DATE:  07/03/2017  REFERRING MD:  Dr. Maylene Roes, [facility]  CHIEF COMPLAINT:  Cardiac arrest  HISTORY OF PRESENT ILLNESS:   67 yo female with nausea and vomiting.  She has hx of Stage IV NSCLC with brain metastases, ulcerated lipodermatosclerosis.  Found to have hyponatremia from hypovolemia with sodium of 116.  She developed cardiac arrest from VF 4/01, had ROSC after 30 minutes and transferred to ICU.  Found to have multiple infarcts on CT head, NSTEMI, aspiration pneumonia.  SUBJECTIVE:  Paradoxical breathing with SBT.  VITAL SIGNS: BP (!) 149/64 (BP Location: Left Arm)   Pulse (!) 103   Temp 99.3 F (37.4 C) (Core)   Resp 16   Ht 5\' 5"  (1.651 m)   Wt 248 lb 14.4 oz (112.9 kg)   SpO2 94%   BMI 41.42 kg/m   VENTILATOR SETTINGS: Vent Mode: PCV FiO2 (%):  [40 %] 40 % Set Rate:  [14 bmp] 14 bmp PEEP:  [5 cmH20] 5 cmH20 Plateau Pressure:  [9 YTK16-01 cmH20] 21 cmH20  INTAKE / OUTPUT: I/O last 3 completed shifts: In: 4072.9 [I.V.:2267.1; NG/GT:1530.8; IV Piggyback:275] Out: 4700 [Urine:4700]  PHYSICAL EXAMINATION:  General - sedated Eyes - pupils reactive ENT - ETT in place Cardiac - regular, no murmur Chest - b/l crackles Lt > Rt Abd - soft, non tender Ext - 1+ edema Skin - multiple areas of ecchymoses Neuro - follows simple commands  LABS:  BMET Recent Labs  Lab 07/06/17 0403 07/07/17 0412 07/08/17 0440  NA 138 140 141  K 3.4* 3.2* 4.3  CL 100* 97* 103  CO2 31 34* 29  BUN 19 22* 30*  CREATININE 0.77 0.83 0.83  GLUCOSE 131* 115* 135*    Electrolytes Recent Labs  Lab 07/05/17 0600 07/05/17 1800 07/06/17 0403 07/07/17 0412 07/08/17 0440  CALCIUM 7.6* 7.8* 8.1* 8.1* 7.4*  MG 2.0 1.8  --  1.8 1.9  PHOS 3.3 3.0  --  3.0  --     CBC Recent Labs  Lab 07/06/17 0403 07/07/17 0412 07/08/17 0440  WBC 6.7 6.2 8.8  HGB  7.4* 8.1* 7.5*  HCT 23.3* 25.3* 24.5*  PLT 115* 148* 188    Coag's No results for input(s): APTT, INR in the last 168 hours.  Sepsis Markers Recent Labs  Lab 07/03/17 0314 07/03/17 0527 07/04/17 0630  LATICACIDVEN 2.5* 2.0* 0.9    ABG Recent Labs  Lab 07/03/17 1927 07/04/17 0451 07/05/17 0400  PHART 7.344* 7.452* 7.485*  PCO2ART 45.2 34.2 34.8  PO2ART 178* 142* 148*    Liver Enzymes Recent Labs  Lab 07/04/17 0630 07/07/17 0412 07/08/17 0440  AST 50* 12* 14*  ALT 45 17 15  ALKPHOS 104 71 63  BILITOT 0.4 0.5 0.4  ALBUMIN 2.2* 2.3* 2.3*    Cardiac Enzymes Recent Labs  Lab 07/03/17 0314 07/03/17 1111  TROPONINI 2.54*  2.39* 4.86*    Glucose Recent Labs  Lab 07/07/17 1226 07/07/17 1640 07/07/17 1904 07/08/17 0043 07/08/17 0351 07/08/17 0808  GLUCAP 104* 112* 119* 123* 121* 126*    Imaging Dg Chest Port 1 View  Result Date: 07/08/2017 CLINICAL DATA:  Pneumonia EXAM: PORTABLE CHEST 1 VIEW COMPARISON:  07/07/2017 FINDINGS: Endotracheal tube in good position. Left jugular central venous catheter tip in the SVC unchanged in position. NG tube enters the stomach. Severe diffuse bilateral airspace disease with progression. Mild to  moderate right pleural effusion unchanged. Bibasilar volume loss right greater than left unchanged IMPRESSION: Support lines remain in satisfactory position Severe bilateral airspace disease with progression. Electronically Signed   By: Franchot Gallo M.D.   On: 07/08/2017 07:13     STUDIES:  Echo 4/01 >> EF 60 to 65%, mod LVH, grade 1 DD, mild RV dilation, mild/mod RV systolic dysfx, small pericardial effusion EEG 4/01 >> diffuse slowing CT head 4/01 >> several small areas of hypoattenuation Rt frontal, Rt parietal, Rt occipital, Lt posterior temporal lobes, b/l cerebellar hemispheres CT chest 4/01 >> extensive b/l infiltrates, small Rt effusion, b/l anterior rib fxs, mid sternal fx Doppler legs 4/01 >> no  DVT  CULTURES: Sputum 4/01 >>mod group B strep   ANTIBIOTICS: Zosyn 4/01 >> 4/05 Rocephin 4/05 >>   SIGNIFICANT EVENTS: 3/28 Admit 4/01 VF cardiac arrest, neuro consulted 4/02 neuro s/o 4/05 started solumedrol 4/06 off pressors  LINES/TUBES: ETT 4/01 >> Rt radial aline 4/01 >> 4/05 Lt IJ CVL 4/01 >>   DISCUSSION: Not making progress with vent weaning.  Main concern is from PNA with increased secretions, rib/sternal fx with paradoxical breathing, and deconditioning.  ASSESSMENT / PLAN:  Acute hypoxic/hypercapnic respiratory failure from aspiration pneumonia, pulmonary edema, and possible pneumonitis. - full vent support - f/u CXR - continue solumedrol - duoneb q6h - if she remains off pressors, then consider restarting lasix 4/07  Aspiration pneumonia with Group B strep in sputum. - day 6 of Abx  VF cardiac arrest. NSTEMI Hx of CAD, HTN. - normal EF on Echo - continue ASA, amiodarone, lopressor - cardiology following  - consider cardiac cath and cardiac MRI once respiratory status improved  Acute metabolic encephalopathy. Chronic pain. - RASS goal 0 to -1 - neurontin, zoloft  Moderate protein calorie malnutrition. - tube feeds  Ulcerated lipodermatosclerosis Lt leg. - wound care  Hyperglycemia.  - SSI  Anemia of critical illness and chronic disease. Mild thrombocytopenia. - f/u CBC  Stage IV NSCLC with brain metastasis s/p gamma knife at [facility]. - spoke with Dr. Elmon Kirschner at [facility] >> advised to continue medical therapy since pt had good response to immunotherapy - consider resuming lorlatinib when more stable  Resolved problems  >> Metabolic acidosis, Cardiogenic and septic shock, Relative adrenal insufficiency  DVT prophylaxis - Lovenox SUP - protonix Nutrition - tube feeds Goals of care - DNR  Updated pt's husband at bedside.  Discussed that concern is lack of improvement with respiratory status, and that she might need trach and longer term vent  support.  CC time 34 minutes  Chesley Mires, MD [place] 07/08/2017, 11:49 AM

## 2017-07-08 NOTE — Progress Notes (Signed)
Progress Note   Subjective   Pt is intubated,  Critically ill.  Hypotensive on pressors.  Inpatient Medications    Scheduled Meds: . amiodarone  400 mg Per NG tube Daily  . aspirin  81 mg Per Tube Daily  . chlorhexidine  15 mL Mouth Rinse BID  . Chlorhexidine Gluconate Cloth  6 each Topical Daily  . enoxaparin (LOVENOX) injection  40 mg Subcutaneous Q24H  . gabapentin  450 mg Per Tube QHS  . insulin aspart  3-9 Units Subcutaneous Q4H  . ipratropium-albuterol  3 mL Nebulization Q4H  . mouth rinse  15 mL Mouth Rinse q12n4p  . methylPREDNISolone (SOLU-MEDROL) injection  40 mg Intravenous BID  . metoprolol tartrate  12.5 mg Per Tube BID  . multivitamin  15 mL Per Tube Daily  . pantoprazole sodium  40 mg Per Tube Q24H  . sennosides  15 mL Per Tube QHS  . sertraline  100 mg Per Tube QHS   Continuous Infusions: . sodium chloride 10 mL/hr at 07/08/17 0740  . cefTRIAXone (ROCEPHIN)  IV Stopped (07/07/17 1415)  . dexmedetomidine (PRECEDEX) IV infusion 0.9 mcg/kg/hr (07/08/17 0740)  . feeding supplement (VITAL HIGH PROTEIN) 55 mL/hr at 07/07/17 1812  . fentaNYL infusion INTRAVENOUS 150 mcg/hr (07/08/17 0740)  . phenylephrine (NEO-SYNEPHRINE) Adult infusion 16.667 mcg/min (07/08/17 0918)   PRN Meds: acetaminophen **OR** acetaminophen, bisacodyl, diclofenac sodium, docusate, fentaNYL, fentaNYL (SUBLIMAZE) injection, sodium chloride flush   Vital Signs    Vitals:   07/08/17 0439 07/08/17 0500 07/08/17 0600 07/08/17 0700  BP:  (!) 152/63 (!) 130/53 (!) 136/56  Pulse:  75 76 72  Resp:  16 13   Temp:  99.5 F (37.5 C) (!) 97.3 F (36.3 C) (!) 97.3 F (36.3 C)  TempSrc:    Core  SpO2:  96% 94% 97%  Weight: 248 lb 14.4 oz (112.9 kg)     Height:        Intake/Output Summary (Last 24 hours) at 07/08/2017 0923 Last data filed at 07/08/2017 0740 Gross per 24 hour  Intake 3108.61 ml  Output 2950 ml  Net 158.61 ml   Filed Weights   07/06/17 2000 07/07/17 0349 07/08/17 0439    Weight: 248 lb 7.3 oz (112.7 kg) 246 lb 14.6 oz (112 kg) 248 lb 14.4 oz (112.9 kg)    Telemetry    Sinus rhythm - Personally Reviewed  Physical Exam   GEN- The patient is ill, sedated on vent Head- normocephalic, atraumatic Eyes-  Sclera clear, conjunctiva pink Oropharynx- ETT Neck- L neck CVL in place Lungs- decreased BS at bases, normal work of breathing Heart- Regular rate and rhythm  GI- soft, NT, ND, + BS Extremities-+ dpeendant edema  MS- diffuse atrophy Skin- multiple ecchymotic skin lesions Psych- sedated Neuro- sedated   Labs    Chemistry Recent Labs  Lab 07/04/17 0630  07/06/17 0403 07/07/17 0412 07/08/17 0440  NA  --    < > 138 140 141  K  --    < > 3.4* 3.2* 4.3  CL  --    < > 100* 97* 103  CO2  --    < > 31 34* 29  GLUCOSE  --    < > 131* 115* 135*  BUN  --    < > 19 22* 30*  CREATININE  --    < > 0.77 0.83 0.83  CALCIUM  --    < > 8.1* 8.1* 7.4*  PROT 5.6*  --   --  6.2* 6.4*  ALBUMIN 2.2*  --   --  2.3* 2.3*  AST 50*  --   --  12* 14*  ALT 45  --   --  17 15  ALKPHOS 104  --   --  71 63  BILITOT 0.4  --   --  0.5 0.4  GFRNONAA  --    < > >60 >60 >60  GFRAA  --    < > >60 >60 >60  ANIONGAP  --    < > 7 9 9    < > = values in this interval not displayed.     Hematology Recent Labs  Lab 07/06/17 0403 07/07/17 0412 07/08/17 0440  WBC 6.7 6.2 8.8  RBC 2.50* 2.66* 2.48*  HGB 7.4* 8.1* 7.5*  HCT 23.3* 25.3* 24.5*  MCV 93.2 95.1 98.8  MCH 29.6 30.5 30.2  MCHC 31.8 32.0 30.6  RDW 15.1 15.3 16.0*  PLT 115* 148* 188    Cardiac Enzymes Recent Labs  Lab 07/03/17 0314 07/03/17 1111  TROPONINI 2.54*  2.39* 4.86*   No results for input(s): TROPIPOC in the last 168 hours.       Patient Profile     67 y.o. female with a hx of pulmonary edema after second chemo, with normal Echo, Metastatic lung cancer-stage IV-metatases to brain, sternum and spine,ulcerated lipodermatosclerosis of LLE, HTNand now admitted for N&V, and na of 116. 4  days post admit with v. Fib arrest. 07/03/17, intubated and sedated. EKG with inf wall ST elevation pk troponin of 4.86.    Assessment & Plan    1.  STEMI/ VF arrest Currently remains critically ill.  Intubated.  Not responsive and requiring pressors. Too ill for cath currently. I agree with Dr Sallyanne Kuster that she would have to make meaningful recovery and be a candidate for dual antiplatelet therapy before we could consider cath. Supportive care. Not an ICD candidate long term On oral amiodarone  2. Stage IV NSCLCa with brain mets, hyponatremia, anemia, aspiration pneumonia Very poor prognosis Ultimately, a conservative or palliative approach may be best.  Would consider palliative care consultation soon.  Critial care time was spent personally by me on the following activities: development of treatment plan with spouse as well as nursing, evaluation of patients response to treatment, examining patient, obtaining history from spouse, reviewing treatments/ interventions, lab studies, pulse ox, and re-evaluation of patients condition.     The patient is critically ill with multiple organ systems failure and requires high complexity decision making for assessment and support, frequent evaluation and titration of therapies, and extensive interpretation of databases.   Critical care was necessary to treat or prevent immintent or life-threatening deterioration.    Total CCT spent directly with the patient today is 30 minutes  Thompson Grayer MD, [facility] 07/08/2017 9:28 AM

## 2017-07-09 ENCOUNTER — Inpatient Hospital Stay (HOSPITAL_COMMUNITY): Payer: BLUE CROSS/BLUE SHIELD

## 2017-07-09 DIAGNOSIS — I2102 ST elevation (STEMI) myocardial infarction involving left anterior descending coronary artery: Secondary | ICD-10-CM

## 2017-07-09 LAB — BASIC METABOLIC PANEL
Anion gap: 8 (ref 5–15)
BUN: 34 mg/dL — ABNORMAL HIGH (ref 6–20)
CHLORIDE: 104 mmol/L (ref 101–111)
CO2: 32 mmol/L (ref 22–32)
Calcium: 8.5 mg/dL — ABNORMAL LOW (ref 8.9–10.3)
Creatinine, Ser: 0.67 mg/dL (ref 0.44–1.00)
Glucose, Bld: 163 mg/dL — ABNORMAL HIGH (ref 65–99)
POTASSIUM: 4.6 mmol/L (ref 3.5–5.1)
SODIUM: 144 mmol/L (ref 135–145)

## 2017-07-09 LAB — GLUCOSE, CAPILLARY
GLUCOSE-CAPILLARY: 124 mg/dL — AB (ref 65–99)
GLUCOSE-CAPILLARY: 126 mg/dL — AB (ref 65–99)
GLUCOSE-CAPILLARY: 131 mg/dL — AB (ref 65–99)
GLUCOSE-CAPILLARY: 133 mg/dL — AB (ref 65–99)
Glucose-Capillary: 111 mg/dL — ABNORMAL HIGH (ref 65–99)
Glucose-Capillary: 123 mg/dL — ABNORMAL HIGH (ref 65–99)

## 2017-07-09 LAB — CBC
HEMATOCRIT: 24.8 % — AB (ref 36.0–46.0)
HEMOGLOBIN: 7.5 g/dL — AB (ref 12.0–15.0)
MCH: 29.6 pg (ref 26.0–34.0)
MCHC: 30.2 g/dL (ref 30.0–36.0)
MCV: 98 fL (ref 78.0–100.0)
Platelets: 205 10*3/uL (ref 150–400)
RBC: 2.53 MIL/uL — AB (ref 3.87–5.11)
RDW: 15.6 % — ABNORMAL HIGH (ref 11.5–15.5)
WBC: 7.5 10*3/uL (ref 4.0–10.5)

## 2017-07-09 LAB — FERRITIN: Ferritin: 150 ng/mL (ref 11–307)

## 2017-07-09 LAB — IRON AND TIBC
IRON: 29 ug/dL (ref 28–170)
SATURATION RATIOS: 11 % (ref 10.4–31.8)
TIBC: 259 ug/dL (ref 250–450)
UIBC: 230 ug/dL

## 2017-07-09 MED ORDER — DEXMEDETOMIDINE HCL IN NACL 200 MCG/50ML IV SOLN
0.4000 ug/kg/h | INTRAVENOUS | Status: DC
  Administered 2017-07-09 (×3): 1.2 ug/kg/h via INTRAVENOUS
  Filled 2017-07-09 (×4): qty 50

## 2017-07-09 MED ORDER — METOPROLOL TARTRATE 25 MG/10 ML ORAL SUSPENSION
25.0000 mg | Freq: Two times a day (BID) | ORAL | Status: DC
  Administered 2017-07-09 – 2017-07-15 (×10): 25 mg
  Filled 2017-07-09 (×14): qty 10

## 2017-07-09 MED ORDER — DEXMEDETOMIDINE HCL IN NACL 400 MCG/100ML IV SOLN
0.4000 ug/kg/h | INTRAVENOUS | Status: AC
  Administered 2017-07-09 – 2017-07-10 (×4): 1.2 ug/kg/h via INTRAVENOUS
  Administered 2017-07-10: 0.9 ug/kg/h via INTRAVENOUS
  Administered 2017-07-10: 1.2 ug/kg/h via INTRAVENOUS
  Administered 2017-07-10: 0.9 ug/kg/h via INTRAVENOUS
  Administered 2017-07-10: 1.2 ug/kg/h via INTRAVENOUS
  Administered 2017-07-10: 0.9 ug/kg/h via INTRAVENOUS
  Administered 2017-07-11: 1.2 ug/kg/h via INTRAVENOUS
  Administered 2017-07-11 (×2): 0.9 ug/kg/h via INTRAVENOUS
  Administered 2017-07-11: 1 ug/kg/h via INTRAVENOUS
  Administered 2017-07-11: 0.9 ug/kg/h via INTRAVENOUS
  Administered 2017-07-12: 1 ug/kg/h via INTRAVENOUS
  Administered 2017-07-12: 0.4 ug/kg/h via INTRAVENOUS
  Administered 2017-07-12 (×2): 1 ug/kg/h via INTRAVENOUS
  Filled 2017-07-09 (×18): qty 100

## 2017-07-09 MED ORDER — IPRATROPIUM-ALBUTEROL 0.5-2.5 (3) MG/3ML IN SOLN: 3.0000 mL | RESPIRATORY_TRACT | Status: DC | PRN

## 2017-07-09 MED ORDER — FUROSEMIDE 10 MG/ML IJ SOLN
40.0000 mg | Freq: Four times a day (QID) | INTRAMUSCULAR | Status: AC
  Administered 2017-07-09 (×2): 40 mg via INTRAVENOUS
  Filled 2017-07-09 (×2): qty 4

## 2017-07-09 MED ORDER — HYDRALAZINE HCL 20 MG/ML IJ SOLN
10.0000 mg | INTRAMUSCULAR | Status: DC | PRN
  Administered 2017-07-09 – 2017-07-14 (×3): 10 mg via INTRAVENOUS
  Filled 2017-07-09 (×4): qty 1

## 2017-07-09 MED ORDER — METHYLPREDNISOLONE SODIUM SUCC 40 MG IJ SOLR
20.0000 mg | Freq: Two times a day (BID) | INTRAMUSCULAR | Status: DC
  Administered 2017-07-09 – 2017-07-16 (×14): 20 mg via INTRAVENOUS
  Filled 2017-07-09 (×15): qty 1

## 2017-07-09 NOTE — Progress Notes (Addendum)
PULMONARY / CRITICAL CARE MEDICINE   Name: Neomi Laidler MRN: [id number] DOB: [birth year]    ADMISSION DATE:  06/29/2017 CONSULTATION DATE:  07/03/2017  REFERRING MD:  Dr. Maylene Roes, [facility]  CHIEF COMPLAINT:  Cardiac arrest  HISTORY OF PRESENT ILLNESS:   67 yo female with nausea and vomiting.  She has hx of Stage IV NSCLC with brain metastases, ulcerated lipodermatosclerosis.  Found to have hyponatremia from hypovolemia with sodium of 116.  She developed cardiac arrest from VF 4/01, had ROSC after 30 minutes and transferred to ICU.  Found to have multiple infarcts on CT head, NSTEMI, aspiration pneumonia.  SUBJECTIVE:  Lasted only about 20 minutes with SBT.  Having loose BMs.  VITAL SIGNS: BP (!) 145/48 (BP Location: Left Arm)   Pulse 68   Temp 99.7 F (37.6 C) (Core)   Resp 17   Ht 5\' 5"  (1.651 m)   Wt 250 lb (113.4 kg)   SpO2 95%   BMI 41.60 kg/m   VENTILATOR SETTINGS: Vent Mode: PCV FiO2 (%):  [40 %-100 %] 40 % Set Rate:  [14 bmp] 14 bmp PEEP:  [5 cmH20] 5 cmH20 Pressure Support:  [10 cmH20] 10 cmH20 Plateau Pressure:  [18 cmH20-24 cmH20] 22 cmH20  INTAKE / OUTPUT: I/O last 3 completed shifts: In: 3972.8 [I.V.:2333.8; NG/GT:1539; IV Piggyback:100] Out: 1725 [WEXHB:7169]  PHYSICAL EXAMINATION:  General - sedated Eyes - pupils reactive ENT - ETT in place Cardiac - regular, no murmur Chest - b/l crackles Abd - soft, non tender Ext - 1+ edema, extremities cool Skin - multiple areas of ecchymoses Neuro - RASS -1   LABS:  BMET Recent Labs  Lab 07/07/17 0412 07/08/17 0440 07/09/17 0424  NA 140 141 144  K 3.2* 4.3 4.6  CL 97* 103 104  CO2 34* 29 32  BUN 22* 30* 34*  CREATININE 0.83 0.83 0.67  GLUCOSE 115* 135* 163*    Electrolytes Recent Labs  Lab 07/05/17 0600 07/05/17 1800  07/07/17 0412 07/08/17 0440 07/09/17 0424  CALCIUM 7.6* 7.8*   < > 8.1* 7.4* 8.5*  MG 2.0 1.8  --  1.8 1.9  --   PHOS 3.3 3.0  --  3.0  --   --    < > = values in this  interval not displayed.    CBC Recent Labs  Lab 07/07/17 0412 07/08/17 0440 07/09/17 0424  WBC 6.2 8.8 7.5  HGB 8.1* 7.5* 7.5*  HCT 25.3* 24.5* 24.8*  PLT 148* 188 205    Coag's No results for input(s): APTT, INR in the last 168 hours.  Sepsis Markers Recent Labs  Lab 07/03/17 0314 07/03/17 0527 07/04/17 0630  LATICACIDVEN 2.5* 2.0* 0.9    ABG Recent Labs  Lab 07/03/17 1927 07/04/17 0451 07/05/17 0400  PHART 7.344* 7.452* 7.485*  PCO2ART 45.2 34.2 34.8  PO2ART 178* 142* 148*    Liver Enzymes Recent Labs  Lab 07/04/17 0630 07/07/17 0412 07/08/17 0440  AST 50* 12* 14*  ALT 45 17 15  ALKPHOS 104 71 63  BILITOT 0.4 0.5 0.4  ALBUMIN 2.2* 2.3* 2.3*    Cardiac Enzymes Recent Labs  Lab 07/03/17 0314 07/03/17 1111  TROPONINI 2.54*  2.39* 4.86*    Glucose Recent Labs  Lab 07/08/17 1141 07/08/17 1529 07/08/17 2029 07/09/17 0053 07/09/17 0409 07/09/17 0804  GLUCAP 130* 144* 125* 124* 133* 111*    Imaging Dg Chest Port 1 View  Result Date: 07/09/2017 CLINICAL DATA:  Respiratory failure EXAM: PORTABLE CHEST 1 VIEW COMPARISON:  07/08/2017 FINDINGS: Endotracheal tube in good position. NG tube enters the stomach with the tip not visualized. Left jugular central venous catheter tip in the SVC. No pneumothorax Severe diffuse bilateral airspace disease shows mild progression. Bilateral pleural effusions. IMPRESSION: Endotracheal tube remains in good position Severe diffuse bilateral airspace disease with mild progression. Electronically Signed   By: Franchot Gallo M.D.   On: 07/09/2017 08:11     STUDIES:  Echo 4/01 >> EF 60 to 65%, mod LVH, grade 1 DD, mild RV dilation, mild/mod RV systolic dysfx, small pericardial effusion EEG 4/01 >> diffuse slowing CT head 4/01 >> several small areas of hypoattenuation Rt frontal, Rt parietal, Rt occipital, Lt posterior temporal lobes, b/l cerebellar hemispheres CT chest 4/01 >> extensive b/l infiltrates, small Rt  effusion, b/l anterior rib fxs, mid sternal fx Doppler legs 4/01 >> no DVT  CULTURES: Sputum 4/01 >>mod group B strep   ANTIBIOTICS: Zosyn 4/01 >> 4/05 Rocephin 4/05 >>   SIGNIFICANT EVENTS: 3/28 Admit 4/01 VF cardiac arrest, neuro consulted 4/02 neuro s/o 4/05 started solumedrol 4/06 off pressors  LINES/TUBES: ETT 4/01 >> Rt radial aline 4/01 >> 4/05 Lt IJ CVL 4/01 >>   DISCUSSION: Not making progress with vent weaning.  Will restart lasix.  Concerned she might need trach and longer term vent weaning.  This will delay further cardiac assessment and resumption of therapy for lung cancer.  ASSESSMENT / PLAN:  Acute hypoxic/hypercapnic respiratory failure from aspiration pneumonia, pulmonary edema, possible pneumonitis, and rib/sternal fractures after CPR. - pressure support wean as tolerated - f/u CXR - change BDs to prn - wean off solumedrol as able - lasix 40 mg IV q6h x 2 doses 4/07  Aspiration pneumonia with Group B strep in sputum. - day 7 of ABx  VF cardiac arrest. NSTEMI Hx of CAD, HTN. - normal EF on Echo - continue ASA, amiodarone, lopressor - cardiac cath on hold until respiratory status improved  Acute metabolic encephalopathy. Chronic pain. - RASS goal 0 to -1 - continue neurontin, zoloft  Moderate protein calorie malnutrition. - tube feeds  Diarrhea. - flexiseal - d/c laxatives  Ulcerated lipodermatosclerosis Lt leg. - wound care  Hyperglycemia.  - SSI  Anemia of critical illness and chronic disease. Mild thrombocytopenia. - f/u CBC - check anemia panel  Stage IV NSCLC with brain metastasis s/p gamma knife at [facility]. - spoke with Dr. Elmon Kirschner at [facility] >> advised to continue medical therapy since pt had good response to immunotherapy - consider resuming lorlatinib when more stable  Resolved problems  >> Metabolic acidosis, Cardiogenic and septic shock, Relative adrenal insufficiency  DVT prophylaxis - Lovenox SUP - protonix Nutrition  - tube feeds Goals of care - DNR  Updated pt's family at bedside  CC time 39 minutes  Chesley Mires, MD [place] 07/09/2017, 10:55 AM

## 2017-07-09 NOTE — Progress Notes (Signed)
Progress Note   Subjective   Remains ill on ventilator.  Has made some purposeful movements.  Now sedated again.  Inpatient Medications    Scheduled Meds: . amiodarone  400 mg Per NG tube Daily  . aspirin  81 mg Per Tube Daily  . chlorhexidine  15 mL Mouth Rinse BID  . Chlorhexidine Gluconate Cloth  6 each Topical Daily  . enoxaparin (LOVENOX) injection  40 mg Subcutaneous Q24H  . gabapentin  450 mg Per Tube QHS  . Gerhardt's butt cream   Topical BID  . insulin aspart  3-9 Units Subcutaneous Q4H  . ipratropium-albuterol  3 mL Nebulization Q6H  . mouth rinse  15 mL Mouth Rinse q12n4p  . methylPREDNISolone (SOLU-MEDROL) injection  40 mg Intravenous BID  . metoprolol tartrate  12.5 mg Per Tube BID  . multivitamin  15 mL Per Tube Daily  . pantoprazole sodium  40 mg Per Tube Q24H  . sennosides  15 mL Per Tube QHS  . sertraline  100 mg Per Tube QHS   Continuous Infusions: . sodium chloride 10 mL/hr at 07/09/17 0744  . cefTRIAXone (ROCEPHIN)  IV Stopped (07/08/17 1451)  . dexmedetomidine (PRECEDEX) IV infusion 1.2 mcg/kg/hr (07/09/17 0817)  . feeding supplement (VITAL HIGH PROTEIN) 1,000 mL (07/09/17 0744)  . fentaNYL infusion INTRAVENOUS 200 mcg/hr (07/09/17 0846)  . phenylephrine (NEO-SYNEPHRINE) Adult infusion Stopped (07/08/17 1116)   PRN Meds: acetaminophen **OR** acetaminophen, bisacodyl, diclofenac sodium, docusate, fentaNYL, fentaNYL (SUBLIMAZE) injection, hydrALAZINE, sodium chloride flush   Vital Signs    Vitals:   07/09/17 0500 07/09/17 0600 07/09/17 0737 07/09/17 0800  BP: (!) 158/60 (!) 152/124  (!) 146/49  Pulse: 72 70  77  Resp: 14 12  18   Temp: (!) 100.6 F (38.1 C) 99.1 F (37.3 C)  100 F (37.8 C)  TempSrc:    Core  SpO2: 98% 93% 94% 97%  Weight:      Height:        Intake/Output Summary (Last 24 hours) at 07/09/2017 0917 Last data filed at 07/09/2017 0744 Gross per 24 hour  Intake 3560.67 ml  Output 475 ml  Net 3085.67 ml   Filed Weights   07/07/17 0349 07/08/17 0439 07/09/17 0411  Weight: 246 lb 14.6 oz (112 kg) 248 lb 14.4 oz (112.9 kg) 250 lb (113.4 kg)    Telemetry    sinus - Personally Reviewed  Physical Exam   GEN- The patient is ill appearing, sedated Head- normocephalic, atraumatic Oropharynx- ETT Neck- supple,  L neck CVL in place Lungs- coarse BS, on vent Heart- Regular rate and rhythm  GI- soft, NT, ND, + BS Extremities- no clubbing, cyanosis, + dependant edema  MS- diffuse atrophy Skin- diffuse ecchymosis Psych- sedated Neuro- sedated   Labs    Chemistry Recent Labs  Lab 07/04/17 0630  07/07/17 0412 07/08/17 0440 07/09/17 0424  NA  --    < > 140 141 144  K  --    < > 3.2* 4.3 4.6  CL  --    < > 97* 103 104  CO2  --    < > 34* 29 32  GLUCOSE  --    < > 115* 135* 163*  BUN  --    < > 22* 30* 34*  CREATININE  --    < > 0.83 0.83 0.67  CALCIUM  --    < > 8.1* 7.4* 8.5*  PROT 5.6*  --  6.2* 6.4*  --   ALBUMIN  2.2*  --  2.3* 2.3*  --   AST 50*  --  12* 14*  --   ALT 45  --  17 15  --   ALKPHOS 104  --  71 63  --   BILITOT 0.4  --  0.5 0.4  --   GFRNONAA  --    < > >60 >60 >60  GFRAA  --    < > >60 >60 >60  ANIONGAP  --    < > 9 9 8    < > = values in this interval not displayed.     Hematology Recent Labs  Lab 07/07/17 0412 07/08/17 0440 07/09/17 0424  WBC 6.2 8.8 7.5  RBC 2.66* 2.48* 2.53*  HGB 8.1* 7.5* 7.5*  HCT 25.3* 24.5* 24.8*  MCV 95.1 98.8 98.0  MCH 30.5 30.2 29.6  MCHC 32.0 30.6 30.2  RDW 15.3 16.0* 15.6*  PLT 148* 188 205    Cardiac Enzymes Recent Labs  Lab 07/03/17 0314 07/03/17 1111  TROPONINI 2.54*  2.39* 4.86*   No results for input(s): TROPIPOC in the last 168 hours.      Assessment & Plan    1.  STEMI/ VF arrest Critically ill and intubated.  She has made some purposeful movements but is now sedated again.  Remains too ill for cath.  Continue on ASA,  Increase metoprolol to 25mg  BID today Consider atorvastatin 80mg  daily if not  contraindicated Continue amiodarone via tube Could consider cath if she makes meaningful recovery and if she is a candidate for dual antiplatelet therapy in the future. Not an ICD candidate with stage IV cancer  2. Stage IV NSCLCa Very poor prognosis long term Ultimately, conservative measures may be best  Critial care time was spent personally by me on the following activities: development of treatment plan with spouse as well as nursing,  evaluation of patients response to treatment, examining patient, obtaining history from spouse, ordering/ reviewing treatments/ interventions, lab studies, radiographic studies, pulse ox, and re-evaluation of patients condition.     The patient is critically ill with multiple organ systems failure and requires high complexity decision making for assessment and support, frequent evaluation and titration of therapies, application of advanced monitoring technologies and extensive interpretation of databases.   Critical care was necessary to treat or prevent immintent or life-threatening deterioration.    Total CCT spent directly with the patient today is 20  Thompson Grayer MD, [facility] 07/09/2017 9:21 AM

## 2017-07-09 NOTE — Progress Notes (Signed)
eLink Physician-Brief Progress Note Patient Name: Jennifer Delgado DOB: 02-Mar-1951 MRN: [id number]   Date of Service  07/09/2017  HPI/Events of Note  Hypertension - BP = 158/60.  eICU Interventions  Will order: 1. Hydralazine 10 mg IV Q 4 hours PRN SBP > 160 or DBP > 100.     Intervention Category Major Interventions: Hypertension - evaluation and management  Minahil Quinlivan [place] 07/09/2017, 5:10 AM

## 2017-07-09 NOTE — Progress Notes (Signed)
Pt given 10mg  prn hydralazine for b/p 176/69

## 2017-07-10 ENCOUNTER — Inpatient Hospital Stay (HOSPITAL_COMMUNITY)
Admit: 2017-07-10 | Discharge: 2017-07-10 | Disposition: A | Discharge status: Home / Self Care | Payer: BLUE CROSS/BLUE SHIELD | Attending: Pulmonary Disease | Admitting: Pulmonary Disease

## 2017-07-10 ENCOUNTER — Inpatient Hospital Stay (HOSPITAL_COMMUNITY): Payer: BLUE CROSS/BLUE SHIELD

## 2017-07-10 LAB — MAGNESIUM: MAGNESIUM: 2 mg/dL (ref 1.7–2.4)

## 2017-07-10 LAB — GLUCOSE, CAPILLARY
GLUCOSE-CAPILLARY: 147 mg/dL — AB (ref 65–99)
GLUCOSE-CAPILLARY: 124 mg/dL — AB (ref 65–99)
GLUCOSE-CAPILLARY: 150 mg/dL — AB (ref 65–99)
Glucose-Capillary: 108 mg/dL — ABNORMAL HIGH (ref 65–99)
Glucose-Capillary: 113 mg/dL — ABNORMAL HIGH (ref 65–99)
Glucose-Capillary: 124 mg/dL — ABNORMAL HIGH (ref 65–99)
Glucose-Capillary: 131 mg/dL — ABNORMAL HIGH (ref 65–99)

## 2017-07-10 LAB — CBC
HCT: 24.9 % — ABNORMAL LOW (ref 36.0–46.0)
Hemoglobin: 7.5 g/dL — ABNORMAL LOW (ref 12.0–15.0)
MCH: 30.1 pg (ref 26.0–34.0)
MCHC: 30.1 g/dL (ref 30.0–36.0)
MCV: 100 fL (ref 78.0–100.0)
PLATELETS: 252 10*3/uL (ref 150–400)
RBC: 2.49 MIL/uL — AB (ref 3.87–5.11)
RDW: 15.9 % — ABNORMAL HIGH (ref 11.5–15.5)
WBC: 5.8 10*3/uL (ref 4.0–10.5)

## 2017-07-10 LAB — BASIC METABOLIC PANEL
ANION GAP: 11 (ref 5–15)
BUN: 41 mg/dL — ABNORMAL HIGH (ref 6–20)
CALCIUM: 8.5 mg/dL — AB (ref 8.9–10.3)
CO2: 32 mmol/L (ref 22–32)
Chloride: 102 mmol/L (ref 101–111)
Creatinine, Ser: 0.7 mg/dL (ref 0.44–1.00)
GFR calc Af Amer: >60 mL/min (ref >60)
Glucose, Bld: 161 mg/dL — ABNORMAL HIGH (ref 65–99)
Potassium: 4 mmol/L (ref 3.5–5.1)
Sodium: 145 mmol/L (ref 135–145)

## 2017-07-10 MED ORDER — ARFORMOTEROL TARTRATE 15 MCG/2ML IN NEBU
15.0000 ug | INHALATION_SOLUTION | Freq: Two times a day (BID) | RESPIRATORY_TRACT | Status: DC
  Administered 2017-07-10 – 2017-07-21 (×22): 15 ug via RESPIRATORY_TRACT
  Filled 2017-07-10 (×23): qty 2

## 2017-07-10 MED ORDER — MIDAZOLAM HCL 2 MG/2ML IJ SOLN
2.0000 mg | Freq: Once | INTRAMUSCULAR | Status: AC
  Administered 2017-07-10: 2 mg via INTRAVENOUS
  Filled 2017-07-10: qty 2

## 2017-07-10 MED ORDER — FUROSEMIDE 10 MG/ML IJ SOLN
40.0000 mg | Freq: Four times a day (QID) | INTRAMUSCULAR | Status: AC
  Administered 2017-07-10 (×2): 40 mg via INTRAVENOUS
  Filled 2017-07-10 (×2): qty 4

## 2017-07-10 NOTE — Progress Notes (Signed)
EEG Completed; Results Pending  

## 2017-07-10 NOTE — Progress Notes (Signed)
[facility] P[facility]M  Notified by nursing that the patient has unexplained tremor in her right shoulder With known brain mets will give versed x1 and order eeg  Roselie Awkward, MD [place] P[facility] Pager: [phone] Cell: [phone] After 3pm or if no response, call [phone]

## 2017-07-10 NOTE — Progress Notes (Signed)
Oral Chemotherapy Consult for Resumption - Lorlatinib  A/P: Patient still currently intubated on ventilator and receiving medications via tube feed currently. This medication, however, cannot be crushed so unfortunately cannot be restarted until patient is extubated and can take medication orally by mouth. Will continue to monitor.   Adrian Saran, PharmD, BCPS Pager [phone] 07/10/2017 12:29 PM

## 2017-07-10 NOTE — Progress Notes (Addendum)
Progress Note  Patient Name: Jennifer Delgado Date of Encounter: 07/10/2017  Primary Cardiologist: Sanda Klein, MD   Subjective   Remains critically ill and intubated in the ICU. Both her husband and son are present by the bedside.   Inpatient Medications    Scheduled Meds: . amiodarone  400 mg Per NG tube Daily  . aspirin  81 mg Per Tube Daily  . chlorhexidine  15 mL Mouth Rinse BID  . Chlorhexidine Gluconate Cloth  6 each Topical Daily  . enoxaparin (LOVENOX) injection  40 mg Subcutaneous Q24H  . gabapentin  450 mg Per Tube QHS  . Gerhardt's butt cream   Topical BID  . insulin aspart  3-9 Units Subcutaneous Q4H  . mouth rinse  15 mL Mouth Rinse q12n4p  . methylPREDNISolone (SOLU-MEDROL) injection  20 mg Intravenous BID  . metoprolol tartrate  25 mg Per Tube BID  . multivitamin  15 mL Per Tube Daily  . pantoprazole sodium  40 mg Per Tube Q24H  . sertraline  100 mg Per Tube QHS   Continuous Infusions: . sodium chloride 10 mL/hr at 07/09/17 1800  . cefTRIAXone (ROCEPHIN)  IV Stopped (07/09/17 1420)  . dexmedetomidine (PRECEDEX) IV infusion 0.9 mcg/kg/hr (07/10/17 0815)  . feeding supplement (VITAL HIGH PROTEIN) 55 mL/hr at 07/10/17 0600  . fentaNYL infusion INTRAVENOUS 250 mcg/hr (07/10/17 0600)   PRN Meds: acetaminophen **OR** acetaminophen, bisacodyl, diclofenac sodium, fentaNYL, fentaNYL (SUBLIMAZE) injection, hydrALAZINE, ipratropium-albuterol, sodium chloride flush   Vital Signs    Vitals:   07/10/17 0600 07/10/17 0700 07/10/17 0750 07/10/17 0800  BP: (!) 140/42 (!) 135/43  (!) 143/42  Pulse: (!) 55 (!) 55  (!) 56  Resp: 13 18  15   Temp: 99.5 F (37.5 C) 99.3 F (37.4 C)  99.3 F (37.4 C)  TempSrc:    Core  SpO2: 97% 96% 97% 96%  Weight:      Height:        Intake/Output Summary (Last 24 hours) at 07/10/2017 0844 Last data filed at 07/10/2017 0600 Gross per 24 hour  Intake 2852 ml  Output 5150 ml  Net -2298 ml   Filed Weights   07/08/17 0439 07/09/17  0411 07/10/17 0500  Weight: 248 lb 14.4 oz (112.9 kg) 250 lb (113.4 kg) 250 lb 7.1 oz (113.6 kg)    Telemetry    NSR  - Personally Reviewed  ECG    07/03/17 Normal sinus rhythm Incomplete right bundle branch block, and inferior ST elevations in leads III and avF  - Personally Reviewed  Physical Exam   GEN:  obese, intubated, critically ill Neck: No JVD Cardiac: RRR, no murmurs, rubs, or gallops.  Respiratory: intubated GI: Soft, nontender, non-distended  MS: upper extremities are edematous. Neuro:  inbutated   Psych: intubated   Labs    Chemistry Recent Labs  Lab 07/04/17 0630  07/07/17 0412 07/08/17 0440 07/09/17 0424 07/10/17 0404  NA  --    < > 140 141 144 145  K  --    < > 3.2* 4.3 4.6 4.0  CL  --    < > 97* 103 104 102  CO2  --    < > 34* 29 32 32  GLUCOSE  --    < > 115* 135* 163* 161*  BUN  --    < > 22* 30* 34* 41*  CREATININE  --    < > 0.83 0.83 0.67 0.70  CALCIUM  --    < >  8.1* 7.4* 8.5* 8.5*  PROT 5.6*  --  6.2* 6.4*  --   --   ALBUMIN 2.2*  --  2.3* 2.3*  --   --   AST 50*  --  12* 14*  --   --   ALT 45  --  17 15  --   --   ALKPHOS 104  --  71 63  --   --   BILITOT 0.4  --  0.5 0.4  --   --   GFRNONAA  --    < > >60 >60 >60 >60  GFRAA  --    < > >60 >60 >60 >60  ANIONGAP  --    < > 9 9 8 11    < > = values in this interval not displayed.     Hematology Recent Labs  Lab 07/08/17 0440 07/09/17 0424 07/10/17 0404  WBC 8.8 7.5 5.8  RBC 2.48* 2.53* 2.49*  HGB 7.5* 7.5* 7.5*  HCT 24.5* 24.8* 24.9*  MCV 98.8 98.0 100.0  MCH 30.2 29.6 30.1  MCHC 30.6 30.2 30.1  RDW 16.0* 15.6* 15.9*  PLT 188 205 252    Cardiac Enzymes Recent Labs  Lab 07/03/17 1111  TROPONINI 4.86*   No results for input(s): TROPIPOC in the last 168 hours.   BNPNo results for input(s): BNP, PROBNP in the last 168 hours.   DDimer No results for input(s): DDIMER in the last 168 hours.   Radiology    Dg Chest Port 1 View  Result Date: 07/10/2017 CLINICAL DATA:   Respiratory failure EXAM: PORTABLE CHEST 1 VIEW COMPARISON:  Yesterday FINDINGS: Endotracheal tube tip at the clavicular heads. An orogastric tube reaches the stomach at least. Left IJ catheter, tip at the SVC origin. Diffuse airspace opacity with layering pleural fluid on the right. Chronic cardiomegaly. No visible pneumothorax. No convincing improvement. IMPRESSION: 1. Stable positioning of hardware. 2. Extensive airspace disease with layering pleural fluid on the right. No interval change. Electronically Signed   By: Monte Fantasia M.D.   On: 07/10/2017 07:08   Dg Chest Port 1 View  Result Date: 07/09/2017 CLINICAL DATA:  Respiratory failure EXAM: PORTABLE CHEST 1 VIEW COMPARISON:  07/08/2017 FINDINGS: Endotracheal tube in good position. NG tube enters the stomach with the tip not visualized. Left jugular central venous catheter tip in the SVC. No pneumothorax Severe diffuse bilateral airspace disease shows mild progression. Bilateral pleural effusions. IMPRESSION: Endotracheal tube remains in good position Severe diffuse bilateral airspace disease with mild progression. Electronically Signed   By: Franchot Gallo M.D.   On: 07/09/2017 08:11    Cardiac Studies   2D Echo 07/03/17 Study Conclusions  - Left ventricle: The cavity size was normal. Wall thickness was   increased in a pattern of moderate LVH. Systolic function was   normal. The estimated ejection fraction was in the range of 60%   to 65%. Wall motion was normal; there were no regional wall   motion abnormalities. Doppler parameters are consistent with   abnormal left ventricular relaxation (grade 1 diastolic   dysfunction). - Right ventricle: The cavity size was mildly dilated. Systolic   function was mildly to moderately reduced. - Pericardium, extracardiac: A small pericardial effusion was   identified. There was no evidence of hemodynamic compromise.   Features were not consistent with tamponade physiology.  Patient Profile      67 y.o.femalewith a hx of pulmonary edema after second chemo, with normal Echo, Metastatic lung cancer-stage IV-metatases to  brain, sternum and spine,ulcerated lipodermatosclerosis of LLE, HTNand now admitted for N&V, and na of 116. 4 days post admit with v. Fib arrest. 07/03/17, intubated and sedated. EKG with inf wall ST elevation pk troponin of 4.86.  Assessment & Plan    1. STEMI/VF Arrest: too critically ill for cath at this time. Also poor prognosis due to metastatic lung cancer. Could consider cath if she makes meaningful recovery and if she is a candidate for dual antiplatelet therapy in the future. Per Dr. Rayann Heman, not a candidate for ICD given stage IV metastatic cancer. For now, continue medical management w/ ASA, BB and amiodarone. Consider addition of statin, if not contraindicated.   2. Stage IV NSCL Cancer: known brain mets as well as mets to sternum and spine. Pt followed at [facility] and reportedly had good response to immunotherapy. Per primary team, they will consider resuming lorlatinib when more stable.     For questions or updates, please contact [place] Please consult [email or web address] for contact info under Cardiology/STEMI.      Signed, Lyda Jester, PA-C  07/10/2017, 8:44 AM    Personally seen and examined. Agree with above.  Noticed some minor twitching of right shoulder. Eyes open. Difficultly following commands.   Exam: as above. Ill appearing, in bed, ETT, RRR, lungs clear  Labs: reviewed personally as above ECG:  07/07/17 - inferior ST elevations ECHO: normal EF 65%  A/P  VFIB arrest, inferior STEMI (Trop 5)  - med mgt. ASA, AMIO IV, metoprolol 25 BID  - consider statin if not harmful from liver perspective  - Not ICD candidate (reviewed Dr. Rayann Heman note)  - Not a cath or invasive candidate either at this point. Continue with aggressive medical mgt. Discussed with son. With her metastatic lung CA, it would not be likely that she would be an  invasive candidate in the near future (more harm than benefit). Had recent Gamma knife.   NSCLung CA, stage 4  - continue to work with oncology. Mets to brain. Hyponatremia.    Candee Furbish, MD

## 2017-07-10 NOTE — Progress Notes (Signed)
PULMONARY / CRITICAL CARE MEDICINE   Name: Jennifer Delgado MRN: [id number] DOB: 27-Jan-1951    ADMISSION DATE:  06/29/2017 CONSULTATION DATE:  07/03/2017  REFERRING MD:  Dr. Maylene Roes, [facility]  CHIEF COMPLAINT:  Cardiac arrest  HISTORY OF PRESENT ILLNESS:   67 y/o female with stage IV NSCLC who had a cardiac arrest on 4/1 from VF in the setting of hyponatremia and pneumonia.   SUBJECTIVE:  Diuresing some Failed SBT  VITAL SIGNS: BP (!) 143/42 (BP Location: Left Arm)   Pulse (!) 56   Temp 99.3 F (37.4 C) (Core)   Resp 15   Ht 5\' 5"  (1.651 m)   Wt 250 lb 7.1 oz (113.6 kg)   SpO2 96%   BMI 41.68 kg/m   HEMODYNAMICS:    VENTILATOR SETTINGS: Vent Mode: PCV FiO2 (%):  [40 %] 40 % Set Rate:  [14 bmp] 14 bmp PEEP:  [5 cmH20] 5 cmH20 Plateau Pressure:  [17 cmH20-20 cmH20] 19 cmH20  INTAKE / OUTPUT: I/O last 3 completed shifts: In: 4206.6 [I.V.:2066.6; NG/GT:2040; IV Piggyback:100] Out: 5150 [Urine:4125; Stool:1025]  PHYSICAL EXAMINATION:  General:  In bed on vent HENT: NCAT ETT in place PULM: wheezing bilaterally, vent supported breathing CV: RRR, no mgr GI: BS+, soft, nontender MSK: normal bulk and tone Derm: significant edema bilaterally Neuro: sedated on vent   LABS:  BMET Recent Labs  Lab 07/08/17 0440 07/09/17 0424 07/10/17 0404  NA 141 144 145  K 4.3 4.6 4.0  CL 103 104 102  CO2 29 32 32  BUN 30* 34* 41*  CREATININE 0.83 0.67 0.70  GLUCOSE 135* 163* 161*    Electrolytes Recent Labs  Lab 07/05/17 0600 07/05/17 1800  07/07/17 0412 07/08/17 0440 07/09/17 0424 07/10/17 0404  CALCIUM 7.6* 7.8*   < > 8.1* 7.4* 8.5* 8.5*  MG 2.0 1.8  --  1.8 1.9  --  2.0  PHOS 3.3 3.0  --  3.0  --   --   --    < > = values in this interval not displayed.    CBC Recent Labs  Lab 07/08/17 0440 07/09/17 0424 07/10/17 0404  WBC 8.8 7.5 5.8  HGB 7.5* 7.5* 7.5*  HCT 24.5* 24.8* 24.9*  PLT 188 205 252    Coag's No results for input(s): APTT, INR in the last  168 hours.  Sepsis Markers Recent Labs  Lab 07/04/17 0630  LATICACIDVEN 0.9    ABG Recent Labs  Lab 07/03/17 1927 07/04/17 0451 07/05/17 0400  PHART 7.344* 7.452* 7.485*  PCO2ART 45.2 34.2 34.8  PO2ART 178* 142* 148*    Liver Enzymes Recent Labs  Lab 07/04/17 0630 07/07/17 0412 07/08/17 0440  AST 50* 12* 14*  ALT 45 17 15  ALKPHOS 104 71 63  BILITOT 0.4 0.5 0.4  ALBUMIN 2.2* 2.3* 2.3*    Cardiac Enzymes Recent Labs  Lab 07/03/17 1111  TROPONINI 4.86*    Glucose Recent Labs  Lab 07/09/17 0804 07/09/17 1207 07/09/17 1531 07/09/17 1950 07/10/17 0002 07/10/17 0416  GLUCAP 111* 123* 131* 126* 131* 147*    Imaging Dg Chest Port 1 View  Result Date: 07/10/2017 CLINICAL DATA:  Respiratory failure EXAM: PORTABLE CHEST 1 VIEW COMPARISON:  Yesterday FINDINGS: Endotracheal tube tip at the clavicular heads. An orogastric tube reaches the stomach at least. Left IJ catheter, tip at the SVC origin. Diffuse airspace opacity with layering pleural fluid on the right. Chronic cardiomegaly. No visible pneumothorax. No convincing improvement. IMPRESSION: 1. Stable positioning of hardware. 2.  Extensive airspace disease with layering pleural fluid on the right. No interval change. Electronically Signed   By: Monte Fantasia M.D.   On: 07/10/2017 07:08     STUDIES:  Echo 4/01 >> EF 60 to 65%, mod LVH, grade 1 DD, mild RV dilation, mild/mod RV systolic dysfx, small pericardial effusion EEG 4/01 >> diffuse slowing CT head 4/01 >> several small areas of hypoattenuation Rt frontal, Rt parietal, Rt occipital, Lt posterior temporal lobes, b/l cerebellar hemispheres CT chest 4/01 >> extensive b/l infiltrates, small Rt effusion, b/l anterior rib fxs, mid sternal fx Doppler legs 4/01 >> no DVT  CULTURES: Sputum 4/01 >>mod group B strep   ANTIBIOTICS: Zosyn 4/01 >> 4/05 Rocephin 4/05 >>   SIGNIFICANT EVENTS: 3/28 Admit 4/01 VF cardiac arrest, neuro consulted 4/02 neuro  s/o 4/05 started solumedrol 4/06 off pressors  LINES/TUBES: ETT 4/01 >> Rt radial aline 4/01 >> 4/05 Lt IJ CVL 4/01 >>     DISCUSSION: 67 y/o female with stage IV lung cancer who had a VFib arrest and pneumonia leading to vent dependent respiratory failure.  She has not been weaning well in the setting of weakness, anasarca and multiple rib fractures.   ASSESSMENT / PLAN:  PULMONARY A: Acute respiratory failure with hypoxemia HCAP Lung cancer COPD exacerbation Acute pulmonary edema P:   Sit up in chair position Daily SBT Add brovana Continue solumedrol VAP prevention Continue full vent support after weaning trial Restart Lorlatinib Continue prn duoneb Lasix x2 today  CARDIOVASCULAR A:  VFib arrest  P:  Tele Metoprolol Cardiology following, LHC after extubation Amiodarone to continue  RENAL A:   No acute issues P:   Monitor BMET and UOP Replace electrolytes as needed   GASTROINTESTINAL A:   No acute issues P:   Continue tube feeding Pantoprazole for stress ulcer prophylaxis   HEMATOLOGIC A:   Anemia, no bleeding P:  Monitor for bleeding  INFECTIOUS A:   HCAP, group B strep P:   Continue ceftriaxone  ENDOCRINE A:   Hyperglycemia   P:   SSI, target 150-180 glucose  NEUROLOGIC A:   Generally weak but following commands post cardiac arrest P:   RASS goal: 0 to -1 PAD protocol: fentanyl, prn versed  Prognosis discussion: As I understand it she has a very favorable molecular or genetic expression pattern for her malignancy and as such we are hopeful that the malignancy could be controlled with the right medication.  Regardless, she is profoundly weak with persistent infiltrates and flail chest making very little progress on the ventilator.  If the family desires prolonged mechanical ventilatory and aggressive support then she will need weeks to months of rehab, likely in a ventilator facility and prognosis in that scenario after her  recent cardiac event is poor.   FAMILY  - Updates: Updated husband bedside at length.    - Inter-disciplinary family meet or Palliative Care meeting due by:  day 7  My cc time 35 minutes  Roselie Awkward, MD [place] [facility] Pager: [phone] Cell: [phone] After 3pm or if no response, call [phone]   07/10/2017, 8:32 AM

## 2017-07-10 NOTE — Procedures (Signed)
  ELECTROENCEPHALOGRAM REPORT  Date of Study: 07/10/17  Patient's Name: Jennifer Delgado MRN: [id number] Date of Birth: 06-13-1950  Referring Provider: Simonne Maffucci, MD  Clinical History: Jennifer Delgado is a 67 y.o. with a hx of pulmonary edema after second chemo, with normal Echo, Metastatic lung cancer-stage IV-metatases to brain, sternum and spine,ulcerated lipodermatosclerosis of LLE, HTNadmitted for N&V, and na of 116. 4 days post admit with v. Fib arrest. 07/03/17, intubated and sedated. EKG with inf wall ST elevation.  Head CT (07/03/17) with several new small areas of hypoattenuation in Rt frontal, parietal, occipital and left temporal lobes, as well as bilateral cerebellar hemispheres, likely representing interval infarctions and embolic phenomenon.     Medications: Scheduled Meds: . amiodarone  400 mg Per NG tube Daily  . arformoterol  15 mcg Nebulization BID  . aspirin  81 mg Per Tube Daily  . chlorhexidine  15 mL Mouth Rinse BID  . Chlorhexidine Gluconate Cloth  6 each Topical Daily  . enoxaparin (LOVENOX) injection  40 mg Subcutaneous Q24H  . gabapentin  450 mg Per Tube QHS  . Gerhardt's butt cream   Topical BID  . insulin aspart  3-9 Units Subcutaneous Q4H  . mouth rinse  15 mL Mouth Rinse q12n4p  . methylPREDNISolone (SOLU-MEDROL) injection  20 mg Intravenous BID  . metoprolol tartrate  25 mg Per Tube BID  . multivitamin  15 mL Per Tube Daily  . pantoprazole sodium  40 mg Per Tube Q24H  . sertraline  100 mg Per Tube QHS   Continuous Infusions: . sodium chloride 10 mL/hr at 07/10/17 1837  . cefTRIAXone (ROCEPHIN)  IV Stopped (07/10/17 1429)  . dexmedetomidine (PRECEDEX) IV infusion 0.9 mcg/kg/hr (07/10/17 1837)  . feeding supplement (VITAL HIGH PROTEIN) 55 mL/hr at 07/10/17 1837  . fentaNYL infusion INTRAVENOUS 275 mcg/hr (07/10/17 1303)   PRN Meds:.acetaminophen **OR** acetaminophen, bisacodyl, diclofenac sodium, fentaNYL, fentaNYL (SUBLIMAZE) injection,  hydrALAZINE, ipratropium-albuterol, sodium chloride flush            Technical Summary: This is a standard 16 channel EEG recording performed according to the international 10-20 electrode system.  AP bipolar, transverse bipolar, and referential montages were obtained, and digitally reformatted as necessary.  Duration of tracing: 26:06  Description: Patient is noted to be intubated/sedated/comatose.  Predominant feature is generalized slowing/disorganization.  There is a 3-4 Hz theta rhythm seen posteriorly, with significant fronto-temporal artifact coinciding with persistent jaw clenching.  No well defined drowsiness or stage 2 sleep is noted.  Neither HV or photic stimulation were performed.  EKG was monitored and noted to be sinus rhythym with an average heart rate of 66 bpm.  No epileptiform changes were noted.  Impression: This is an abnormal EEG due to background slowing and disorganization seen throughout the tracing.  Muscle artifact is noted due to jaw clenching.  The observed slowing is a non-specific finding that can be seen with toxic, metabolic, diffuse, or multifocal structural processes.  No definite epileptiform changes were noted.   A single EEG without epileptiform changes does not exclude the diagnosis of epilepsy. Clinical correlation advised.   Carvel Getting, M.D. Neurology Cell [phone]

## 2017-07-11 ENCOUNTER — Inpatient Hospital Stay (HOSPITAL_COMMUNITY): Payer: BLUE CROSS/BLUE SHIELD

## 2017-07-11 LAB — BASIC METABOLIC PANEL
ANION GAP: 9 (ref 5–15)
BUN: 42 mg/dL — ABNORMAL HIGH (ref 6–20)
CALCIUM: 8.7 mg/dL — AB (ref 8.9–10.3)
CO2: 34 mmol/L — ABNORMAL HIGH (ref 22–32)
Chloride: 104 mmol/L (ref 101–111)
Creatinine, Ser: 0.68 mg/dL (ref 0.44–1.00)
Glucose, Bld: 133 mg/dL — ABNORMAL HIGH (ref 65–99)
Potassium: 3.7 mmol/L (ref 3.5–5.1)
Sodium: 147 mmol/L — ABNORMAL HIGH (ref 135–145)

## 2017-07-11 LAB — GLUCOSE, CAPILLARY
GLUCOSE-CAPILLARY: 127 mg/dL — AB (ref 65–99)
GLUCOSE-CAPILLARY: 113 mg/dL — AB (ref 65–99)
GLUCOSE-CAPILLARY: 129 mg/dL — AB (ref 65–99)
GLUCOSE-CAPILLARY: 109 mg/dL — AB (ref 65–99)
Glucose-Capillary: 166 mg/dL — ABNORMAL HIGH (ref 65–99)
Glucose-Capillary: 132 mg/dL — ABNORMAL HIGH (ref 65–99)

## 2017-07-11 LAB — CBC WITH DIFFERENTIAL/PLATELET
BASOS ABS: 0 10*3/uL (ref 0.0–0.1)
BASOS PCT: 0 %
Eosinophils Absolute: 0 10*3/uL (ref 0.0–0.7)
Eosinophils Relative: 0 %
HEMATOCRIT: 26.2 % — AB (ref 36.0–46.0)
Hemoglobin: 7.9 g/dL — ABNORMAL LOW (ref 12.0–15.0)
Lymphocytes Relative: 11 %
Lymphs Abs: 0.6 10*3/uL — ABNORMAL LOW (ref 0.7–4.0)
MCH: 30.2 pg (ref 26.0–34.0)
MCHC: 30.2 g/dL (ref 30.0–36.0)
MCV: 100 fL (ref 78.0–100.0)
Monocytes Absolute: 0.5 10*3/uL (ref 0.1–1.0)
Monocytes Relative: 10 %
NEUTROS ABS: 4.3 10*3/uL (ref 1.7–7.7)
Neutrophils Relative %: 79 %
Platelets: 312 10*3/uL (ref 150–400)
RBC: 2.62 MIL/uL — AB (ref 3.87–5.11)
RDW: 16.1 % — ABNORMAL HIGH (ref 11.5–15.5)
WBC: 5.4 10*3/uL (ref 4.0–10.5)

## 2017-07-11 LAB — MAGNESIUM: Magnesium: 2.1 mg/dL (ref 1.7–2.4)

## 2017-07-11 MED ORDER — FUROSEMIDE 10 MG/ML IJ SOLN
40.0000 mg | Freq: Four times a day (QID) | INTRAMUSCULAR | Status: AC
  Administered 2017-07-11 (×2): 40 mg via INTRAVENOUS
  Filled 2017-07-11 (×2): qty 4

## 2017-07-11 MED ORDER — MIDAZOLAM HCL 2 MG/2ML IJ SOLN
2.0000 mg | Freq: Once | INTRAMUSCULAR | Status: AC
  Administered 2017-07-11: 2 mg via INTRAVENOUS
  Filled 2017-07-11: qty 2

## 2017-07-11 NOTE — Progress Notes (Signed)
eLink Physician-Brief Progress Note Patient Name: Jennifer Delgado DOB: 1950/12/15 MRN: [id number]   Date of Service  07/11/2017  HPI/Events of Note  Bedside nurse request Versed 2 mg IV to get patient back to bed from chair. Patient is intubated and ventilated.   eICU Interventions  Will order: 1. Versed 2 mg IV prior to transfer to bed.      Intervention Category Major Interventions: Other:  Jennifer Delgado 07/11/2017, 5:50 AM

## 2017-07-11 NOTE — Progress Notes (Signed)
   No new recommendations at this time.  Please see my note from 07/10/17 for details. She is not an ICD candidate nor is she a cath or invasive candidate at this point.  Continue with aggressive medical management as we are pursuing.  Candee Furbish, MD

## 2017-07-11 NOTE — Progress Notes (Addendum)
PULMONARY / CRITICAL CARE MEDICINE   Name: Jennifer Delgado MRN: [id number] DOB: 1950/10/25    ADMISSION DATE:  06/29/2017 CONSULTATION DATE:  07/03/2017  REFERRING MD:  Dr. Maylene Roes, [facility]  CHIEF COMPLAINT:  Cardiac arrest  HISTORY OF PRESENT ILLNESS:   67 y/o female with stage IV NSCLC who had a cardiac arrest on 4/1 from VF in the setting of hyponatremia and pneumonia.   SUBJECTIVE:  Unexplained right shoulder tremor yesterday, stopped with versed, had eeg; was up in chair   VITAL SIGNS: BP (!) 145/49   Pulse 65   Temp 100 F (37.8 C)   Resp 11   Ht 5\' 5"  (1.651 m)   Wt 250 lb 7.1 oz (113.6 kg)   SpO2 97%   BMI 41.68 kg/m   HEMODYNAMICS:    VENTILATOR SETTINGS: Vent Mode: PCV FiO2 (%):  [40 %] 40 % Set Rate:  [14 bmp] 14 bmp PEEP:  [5 cmH20] 5 cmH20 Plateau Pressure:  [15 cmH20-21 cmH20] 15 cmH20  INTAKE / OUTPUT: I/O last 3 completed shifts: In: 4712.4 [I.V.:2552.4; NG/GT:2060; IV Piggyback:100] Out: 5625 [Urine:4975; Emesis/NG output:450; Stool:200]  PHYSICAL EXAMINATION:  General:  In bed on vent HENT: NCAT ETT in place PULM: rhonchi R base B, vent supported breathing CV: RRR, no mgr GI: BS+, soft, nontender MSK: normal bulk and tone Neuro: sedated on vent      LABS:  BMET Recent Labs  Lab 07/09/17 0424 07/10/17 0404 07/11/17 0500  NA 144 145 147*  K 4.6 4.0 3.7  CL 104 102 104  CO2 32 32 34*  BUN 34* 41* 42*  CREATININE 0.67 0.70 0.68  GLUCOSE 163* 161* 133*    Electrolytes Recent Labs  Lab 07/05/17 0600 07/05/17 1800  07/07/17 0412 07/08/17 0440 07/09/17 0424 07/10/17 0404 07/11/17 0500  CALCIUM 7.6* 7.8*   < > 8.1* 7.4* 8.5* 8.5* 8.7*  MG 2.0 1.8  --  1.8 1.9  --  2.0 2.1  PHOS 3.3 3.0  --  3.0  --   --   --   --    < > = values in this interval not displayed.    CBC Recent Labs  Lab 07/09/17 0424 07/10/17 0404 07/11/17 0500  WBC 7.5 5.8 5.4  HGB 7.5* 7.5* 7.9*  HCT 24.8* 24.9* 26.2*  PLT 205 252 312    Coag's No  results for input(s): APTT, INR in the last 168 hours.  Sepsis Markers No results for input(s): LATICACIDVEN, PROCALCITON, O2SATVEN in the last 168 hours.  ABG Recent Labs  Lab 07/05/17 0400  PHART 7.485*  PCO2ART 34.8  PO2ART 148*    Liver Enzymes Recent Labs  Lab 07/07/17 0412 07/08/17 0440  AST 12* 14*  ALT 17 15  ALKPHOS 71 63  BILITOT 0.5 0.4  ALBUMIN 2.3* 2.3*    Cardiac Enzymes No results for input(s): TROPONINI, PROBNP in the last 168 hours.  Glucose Recent Labs  Lab 07/10/17 1128 07/10/17 1602 07/10/17 1941 07/10/17 2325 07/11/17 0304 07/11/17 0745  GLUCAP 108* 124* 124* 150* 127* 113*    Imaging Dg Chest Port 1 View  Result Date: 07/11/2017 CLINICAL DATA:  Respiratory failure. EXAM: PORTABLE CHEST 1 VIEW COMPARISON:  07/10/2017. FINDINGS: Endotracheal tube and orogastric tube in stable position. Left IJ line stable position. Left IJ line Skene at the insertion site. Heart size stable. Persistent but improved bilateral pulmonary infiltrates/edema. Persistent but improved small right pleural effusion. No pneumothorax. IMPRESSION: 1.  Lines and tubes in stable position. 2.  Persistent but improved bilateral pulmonary infiltrates/edema. Persistent but improved small right-sided pleural effusion Electronically Signed   By: [place]   On: 07/11/2017 07:24     STUDIES:  Echo 4/01 >> EF 60 to 65%, mod LVH, grade 1 DD, mild RV dilation, mild/mod RV systolic dysfx, small pericardial effusion EEG 4/01 >> diffuse slowing LE Doppler ultrasound 4/1 > no bilateral lower ext DVT CT head 4/01 >> several small areas of hypoattenuation Rt frontal, Rt parietal, Rt occipital, Lt posterior temporal lobes, b/l cerebellar hemispheres CT chest 4/01 >> extensive b/l infiltrates, small Rt effusion, b/l anterior rib fxs, mid sternal fx Doppler legs 4/01 >> no DVT EEG 4/9 > Abnormal EEG with background slowing, no definite epileptiform changes noted  CULTURES: Sputum  4/01 >>mod group B strep   ANTIBIOTICS: Zosyn 4/01 >> 4/05 Rocephin 4/05 >>   SIGNIFICANT EVENTS: 3/28 Admit 4/01 VF cardiac arrest, neuro consulted 4/02 neuro s/o 4/05 started solumedrol 4/06 off pressors  LINES/TUBES: ETT 4/01 >> Rt radial aline 4/01 >> 4/05 Lt IJ CVL 4/01 >>     DISCUSSION: 67 y/o female with stage IV lung cancer who had a VFib arrest and pneumonia leading to vent dependent respiratory failure.  She has not been weaning well in the setting of weakness, anasarca and multiple rib fractures.   ASSESSMENT / PLAN:  PULMONARY A: Acute respiratory failure with hypoxemia HCAP Lung cancer COPD exacerbation Acute pulmonary edema P:   Restart lorlatinib Continue brovana Sit up in chair  SBT as long as tolerated Continue diuresis Full mechanical vent support VAP prevention Daily WUA/SBT  CARDIOVASCULAR A:  VFib arrest STEMI P:  Tele Metoprolol Amiodarone LHC decision per cardiology  RENAL A:   No acute issues P:   Monitor BMET and UOP Replace electrolytes as needed  GASTROINTESTINAL A:   No acute issues P:   Continue tube feeding Pantoprazole for stress ulcer prophylaxis   HEMATOLOGIC A:   Anemia, no bleeding P:  Monitor for bleeding  INFECTIOUS A:   HCAP, group B strep P:   Stop ceftriaxone after antibiotic day 10  ENDOCRINE A:   Hyperglycemia   P:   SSI, target 140-180 glucose   NEUROLOGIC A:   Generally weak but following commands post cardiac arrest Brain metastasis: only one seen on MRI at [facility] in 05/2017, now with multiple hypoattenuating lesions on MRI brain: infarct? P:   RASS goal 0 to -1 PAD protocol: fentanyl, prn versed MRI brain if not extubated today  Prognosis discussion: As I understand it she has a very favorable molecular or genetic expression pattern for her malignancy and as such we are hopeful that the malignancy could be controlled with the right medication.  Regardless, she is profoundly weak  with persistent infiltrates and flail chest making very little progress on the ventilator.  If the family desires prolonged mechanical ventilatory and aggressive support then she will need weeks to months of rehab, likely in a ventilator facility and prognosis in that scenario after her recent cardiac event is poor.   FAMILY  - Updates: Updated husband bedside at length again on 4/9  - Inter-disciplinary family meet or Palliative Care meeting due by:  day 7  My cc time 35 minutes  Roselie Awkward, MD [place] [facility] Pager: [phone] Cell: [phone] After 3pm or if no response, call [phone]   07/11/2017, 7:59 AM

## 2017-07-12 ENCOUNTER — Inpatient Hospital Stay (HOSPITAL_COMMUNITY): Payer: BLUE CROSS/BLUE SHIELD

## 2017-07-12 LAB — CBC WITH DIFFERENTIAL/PLATELET
Basophils Absolute: 0 10*3/uL (ref 0.0–0.1)
Basophils Relative: 0 %
EOS ABS: 0.1 10*3/uL (ref 0.0–0.7)
EOS PCT: 1 %
HCT: 26.1 % — ABNORMAL LOW (ref 36.0–46.0)
Hemoglobin: 7.9 g/dL — ABNORMAL LOW (ref 12.0–15.0)
LYMPHS ABS: 0.9 10*3/uL (ref 0.7–4.0)
Lymphocytes Relative: 15 %
MCH: 29.6 pg (ref 26.0–34.0)
MCHC: 30.3 g/dL (ref 30.0–36.0)
MCV: 97.8 fL (ref 78.0–100.0)
Monocytes Absolute: 0.4 10*3/uL (ref 0.1–1.0)
Monocytes Relative: 7 %
Neutro Abs: 4.5 10*3/uL (ref 1.7–7.7)
Neutrophils Relative %: 77 %
PLATELETS: 272 10*3/uL (ref 150–400)
RBC: 2.67 MIL/uL — AB (ref 3.87–5.11)
RDW: 16 % — ABNORMAL HIGH (ref 11.5–15.5)
WBC: 6 10*3/uL (ref 4.0–10.5)

## 2017-07-12 LAB — GLUCOSE, CAPILLARY
GLUCOSE-CAPILLARY: 121 mg/dL — AB (ref 65–99)
Glucose-Capillary: 149 mg/dL — ABNORMAL HIGH (ref 65–99)
Glucose-Capillary: 150 mg/dL — ABNORMAL HIGH (ref 65–99)
Glucose-Capillary: 114 mg/dL — ABNORMAL HIGH (ref 65–99)

## 2017-07-12 LAB — BASIC METABOLIC PANEL
Anion gap: 10 (ref 5–15)
BUN: 37 mg/dL — AB (ref 6–20)
CALCIUM: 8.7 mg/dL — AB (ref 8.9–10.3)
CHLORIDE: 103 mmol/L (ref 101–111)
CO2: 35 mmol/L — ABNORMAL HIGH (ref 22–32)
CREATININE: 0.6 mg/dL (ref 0.44–1.00)
GFR calc Af Amer: >60 mL/min (ref >60)
GFR calc non Af Amer: >60 mL/min (ref >60)
Glucose, Bld: 125 mg/dL — ABNORMAL HIGH (ref 65–99)
Potassium: 3.2 mmol/L — ABNORMAL LOW (ref 3.5–5.1)
Sodium: 148 mmol/L — ABNORMAL HIGH (ref 135–145)

## 2017-07-12 MED ORDER — GADOBENATE DIMEGLUMINE 529 MG/ML IV SOLN
20.0000 mL | Freq: Once | INTRAVENOUS | Status: AC | PRN
  Administered 2017-07-12: 20 mL via INTRAVENOUS

## 2017-07-12 MED ORDER — FUROSEMIDE 10 MG/ML IJ SOLN
40.0000 mg | Freq: Four times a day (QID) | INTRAMUSCULAR | Status: AC
  Administered 2017-07-12 (×2): 40 mg via INTRAVENOUS
  Filled 2017-07-12 (×2): qty 4

## 2017-07-12 MED ORDER — DEXMEDETOMIDINE HCL IN NACL 200 MCG/50ML IV SOLN
0.4000 ug/kg/h | INTRAVENOUS | Status: DC
  Administered 2017-07-12: 0.8 ug/kg/h via INTRAVENOUS
  Administered 2017-07-13 (×3): 1 ug/kg/h via INTRAVENOUS
  Filled 2017-07-12 (×3): qty 50

## 2017-07-12 MED ORDER — POTASSIUM CHLORIDE 20 MEQ/15ML (10%) PO SOLN
40.0000 meq | Freq: Two times a day (BID) | ORAL | Status: AC
  Administered 2017-07-12 – 2017-07-13 (×4): 40 meq
  Filled 2017-07-12 (×6): qty 30

## 2017-07-12 NOTE — Progress Notes (Signed)
Nutrition Follow-up  DOCUMENTATION CODES:   Obesity unspecified  INTERVENTION:  - Continue current TF regimen.   NUTRITION DIAGNOSIS:   Inadequate oral intake related to inability to eat as evidenced by NPO status. -ongoing  GOAL:   Provide needs based on ASPEN/SCCM guidelines -met with TF regimen.  MONITOR:   TF tolerance, Vent status, Weight trends, Labs, Skin  ASSESSMENT:   67 yr old female with PMHx of NSCLS with mets to the brain, spine and liver s/p Gamma knife at [facility] on 3/26 and 3/27 presented to [facility] with nausea and vomiting accompanied by HA and SOB. Admitted to Hospitalist service for hypovolemic hyponatremia.  Weight had been trending up 4/3-49 but is now back to being consistent with weight from 3/28 and 3/29. Pt remains intubated with OGT in place. She is receiving Vital High Protein @ 55 mL/hr with 30 mL free water every 4 hours. This regimen is providing 1320 kcal, 115 grams of protein, and 1283 mL free water. Pt with eyes open and son at bedside; he denies any nutrition-related questions or concerns at this time.   Per Dr. Anastasia Pall note yesterday AM: EEG done yesterday was abnormal, pt has been off pressors since 4/6, plan to restart lorlatinib yesterday. Plan as of yesterday was for MRI of brain if pt not extubated yesterday (4/9). Will monitor for further plans and interventions that may warrant adjustments of nutrition-related interventions.    Patient is currently intubated on ventilator support MV: 13.1 L/min Temp (24hrs), Avg:99.9 F (37.7 C), Min:99 F (37.2 C), Max:100.6 F (38.1 C)  Medications reviewed;  Labs reviewed; CBGs: 121 and 149 mg/dL today, Na: 148 mmol/L, K: 3.2 mmol/L, BUN: 37 mg/dL, Ca: 8.7 mg/dL.  Drips: Precedex @ 0.5 mcg/kg/hr, Fentanyl @ 150 mcg/hr.     Diet Order:  Diet NPO time specified  EDUCATION NEEDS:   No education needs have been identified at this time  Skin:  Skin Assessment: Skin Integrity Issues: Skin Integrity  Issues:: DTI DTI: lip (4/5) Other: non-pressure injury to L leg  Last BM:  4/10  Height:   Ht Readings from Last 1 Encounters:  06/30/17 5' 5"  (1.651 m)    Weight:   Wt Readings from Last 1 Encounters:  07/12/17 231 lb 7.7 oz (105 kg)    Ideal Body Weight:  56.82 kg  BMI:  Body mass index is 38.52 kg/m.  Estimated Nutritional Needs:   Kcal:  0177-9390 (11-14 kcal/kg)  Protein:  >/= 114 grams (2 grams/kg IBW)  Fluid:  >/= 1.5 L/day      Jarome Matin, MS, RD, LDN, [facility] Inpatient Clinical Dietitian Pager # [phone] After hours/weekend pager # [phone]

## 2017-07-12 NOTE — Progress Notes (Signed)
PULMONARY / CRITICAL CARE MEDICINE   Name: Jennifer Delgado MRN: [id number] DOB: Jan 12, 1951    ADMISSION DATE:  06/29/2017 CONSULTATION DATE:  07/03/2017  REFERRING MD:  Dr. Maylene Roes, [facility]  CHIEF COMPLAINT:  Cardiac arrest  HISTORY OF PRESENT ILLNESS:   67 y/o female with stage IV NSCLC who had a cardiac arrest on 4/1 from VF in the setting of hyponatremia and pneumonia.   SUBJECTIVE:  Failing SBT again diuresing  VITAL SIGNS: BP (!) 144/48   Pulse 75   Temp 100.2 F (37.9 C)   Resp 20   Ht 5\' 5"  (1.651 m)   Wt 105 kg (231 lb 7.7 oz)   SpO2 99%   BMI 38.52 kg/m   HEMODYNAMICS:    VENTILATOR SETTINGS: Vent Mode: PCV FiO2 (%):  [40 %-100 %] 40 % Set Rate:  [14 bmp] 14 bmp PEEP:  [5 cmH20-8 cmH20] 8 cmH20 Plateau Pressure:  [12 cmH20-26 cmH20] 26 cmH20  INTAKE / OUTPUT: I/O last 3 completed shifts: In: 4469.7 [I.V.:2443.7; NG/GT:1926.1; IV Piggyback:100] Out: 7200 [Urine:6000; Emesis/NG output:450; Stool:750]  PHYSICAL EXAMINATION:  General:  Crhonicaly ill appaering, In bed on vent HENT: NCAT ETT in place PULM: Crackles bases B, vent supported breathing CV: RRR, no mgr GI: BS+, soft, nontender MSK: normal bulk and tone Neuro: awake on vent, follows commands but very weak   LABS:  BMET Recent Labs  Lab 07/10/17 0404 07/11/17 0500 07/12/17 0600  NA 145 147* 148*  K 4.0 3.7 3.2*  CL 102 104 103  CO2 32 34* 35*  BUN 41* 42* 37*  CREATININE 0.70 0.68 0.60  GLUCOSE 161* 133* 125*    Electrolytes Recent Labs  Lab 07/05/17 1800  07/07/17 0412 07/08/17 0440  07/10/17 0404 07/11/17 0500 07/12/17 0600  CALCIUM 7.8*   < > 8.1* 7.4*   < > 8.5* 8.7* 8.7*  MG 1.8  --  1.8 1.9  --  2.0 2.1  --   PHOS 3.0  --  3.0  --   --   --   --   --    < > = values in this interval not displayed.    CBC Recent Labs  Lab 07/10/17 0404 07/11/17 0500 07/12/17 0600  WBC 5.8 5.4 6.0  HGB 7.5* 7.9* 7.9*  HCT 24.9* 26.2* 26.1*  PLT 252 312 272    Coag's No  results for input(s): APTT, INR in the last 168 hours.  Sepsis Markers No results for input(s): LATICACIDVEN, PROCALCITON, O2SATVEN in the last 168 hours.  ABG No results for input(s): PHART, PCO2ART, PO2ART in the last 168 hours.  Liver Enzymes Recent Labs  Lab 07/07/17 0412 07/08/17 0440  AST 12* 14*  ALT 17 15  ALKPHOS 71 63  BILITOT 0.5 0.4  ALBUMIN 2.3* 2.3*    Cardiac Enzymes No results for input(s): TROPONINI, PROBNP in the last 168 hours.  Glucose Recent Labs  Lab 07/11/17 1210 07/11/17 1535 07/11/17 1958 07/11/17 2326 07/12/17 0316 07/12/17 0811  GLUCAP 166* 132* 129* 109* 121* 149*    Imaging Dg Chest Port 1 View  Result Date: 07/12/2017 CLINICAL DATA:  Respiratory failure EXAM: PORTABLE CHEST 1 VIEW COMPARISON:  07/11/2017 FINDINGS: Cardiac shadow is stable. Endotracheal tube, nasogastric catheter and left jugular central line are again seen and stable. Persistent right-sided pleural effusion with basilar atelectasis. Is seen. Left basilar atelectasis has increased in the interval. IMPRESSION: Increasing changes in the left base. Electronically Signed   By: Linus Mako.D.  On: 07/12/2017 07:44     STUDIES:  Echo 4/01 >> EF 60 to 65%, mod LVH, grade 1 DD, mild RV dilation, mild/mod RV systolic dysfx, small pericardial effusion EEG 4/01 >> diffuse slowing LE Doppler ultrasound 4/1 > no bilateral lower ext DVT CT head 4/01 >> several small areas of hypoattenuation Rt frontal, Rt parietal, Rt occipital, Lt posterior temporal lobes, b/l cerebellar hemispheres CT chest 4/01 >> extensive b/l infiltrates, small Rt effusion, b/l anterior rib fxs, mid sternal fx Doppler legs 4/01 >> no DVT EEG 4/9 > Abnormal EEG with background slowing, no definite epileptiform changes noted  CULTURES: Sputum 4/01 >>mod group B strep   ANTIBIOTICS: Zosyn 4/01 >> 4/05 Rocephin 4/05 >>   SIGNIFICANT EVENTS: 3/28 Admit 4/01 VF cardiac arrest, neuro consulted 4/02 neuro  s/o 4/05 started solumedrol 4/06 off pressors  LINES/TUBES: ETT 4/01 >> Rt radial aline 4/01 >> 4/05 Lt IJ CVL 4/01 >>    DISCUSSION: 67 y/o female with stage IV lung cancer who had a VFib arrest and pneumonia leading to vent dependent respiratory failure.  She has not been weaning well in the setting of weakness, anasarca and multiple rib fractures.   See goals of care discussion below.   ASSESSMENT / PLAN:  PULMONARY A: Acute respiratory failure with hypoxemia HCAP Lung cancer COPD exacerbation Acute pulmonary edema P:   Can't restart lorlatinib until she can swallow Continue brovana Full mechanical vent support VAP prevention Daily WUA/SBT Needs tracheostomy Continue diuresis  CARDIOVASCULAR A:  VFib arrest STEMI P:  Tele Metoprolol, amiodarone to continue LHC decision per cardiology  RENAL A:   Mild hypernatremia hypokalemia P:   Monitor BMET and UOP Replace electrolytes as needed Scheduled KCl replacement with lasix  GASTROINTESTINAL A:   No acute issues P:   Continue tube feeding Pantoprazole for stress ulcer prophylaxis  HEMATOLOGIC A:   Anemia, no bleeding P:  Monitor for bleeding  INFECTIOUS A:   HCAP, group B strep P:   Stop ceftriaxone 4/11  ENDOCRINE A:   Hyperglycemia   P:   SSI, target 140-180 glucose   NEUROLOGIC A:   Generally weak but following commands post cardiac arrest Brain metastasis: only one seen on MRI at [facility] in 05/2017, now with multiple hypoattenuating lesions on MRI brain: infarct? P:   RASS goal 0 to -1 Change fentanyl to dilaudid for better pain control, likely tachyphylaxis Continue precedex MRI Brain  Prognosis discussion: See previous documentation. This is a tough situation because individuals with stage 4 lung cancer with severe weakness, acute cardiac events and ongoing respiratory failure uniformly do not survive to a meaningful quality of life.  In this situation the patient's family has been  told that the lung cancer should not impact decision making because of a favorable molecular or genetic profile of her malignancy and its predicted response to something called lorlatinib.  Sadly, many individuals in her situation who don't have cancer also don't survive to a meaningful recovery.  I explained this to her husband at length today and he says that he wants to continue aggressive measures.  I explained in detail the likelihood of death or severe complications from ongoing inpatient care.  He prefers to consider a tracheostomy.    FAMILY  - Updates: Updated husband bedside at length again on 4/10  - Inter-disciplinary family meet or Palliative Care meeting due by:  day 7  My cc time 45 minutes  Roselie Awkward, MD [place] [facility] Pager: [phone] Cell: [phone] After 3pm  or if no response, call [phone]   07/12/2017, 12:28 PM

## 2017-07-13 ENCOUNTER — Inpatient Hospital Stay: Payer: Self-pay

## 2017-07-13 LAB — GLUCOSE, CAPILLARY
GLUCOSE-CAPILLARY: 109 mg/dL — AB (ref 65–99)
GLUCOSE-CAPILLARY: 123 mg/dL — AB (ref 65–99)
GLUCOSE-CAPILLARY: 123 mg/dL — AB (ref 65–99)
GLUCOSE-CAPILLARY: 135 mg/dL — AB (ref 65–99)
GLUCOSE-CAPILLARY: 148 mg/dL — AB (ref 65–99)
Glucose-Capillary: 118 mg/dL — ABNORMAL HIGH (ref 65–99)
Glucose-Capillary: 119 mg/dL — ABNORMAL HIGH (ref 65–99)
Glucose-Capillary: 134 mg/dL — ABNORMAL HIGH (ref 65–99)

## 2017-07-13 MED ORDER — ACETAMINOPHEN 160 MG/5ML PO SOLN
650.0000 mg | Freq: Four times a day (QID) | ORAL | Status: DC
  Administered 2017-07-13 – 2017-07-16 (×6): 650 mg via ORAL
  Filled 2017-07-13 (×5): qty 20.3

## 2017-07-13 MED ORDER — KETOROLAC TROMETHAMINE 15 MG/ML IJ SOLN
15.0000 mg | Freq: Three times a day (TID) | INTRAMUSCULAR | Status: DC
  Administered 2017-07-13 – 2017-07-17 (×12): 15 mg via INTRAVENOUS
  Filled 2017-07-13 (×12): qty 1

## 2017-07-13 MED ORDER — POTASSIUM CHLORIDE 20 MEQ PO PACK
40.0000 meq | PACK | Freq: Once | ORAL | Status: AC
  Administered 2017-07-13: 40 meq via ORAL
  Filled 2017-07-13: qty 2

## 2017-07-13 MED ORDER — DEXMEDETOMIDINE HCL IN NACL 400 MCG/100ML IV SOLN
0.4000 ug/kg/h | INTRAVENOUS | Status: DC
  Administered 2017-07-13 (×3): 0.6 ug/kg/h via INTRAVENOUS
  Administered 2017-07-13: 1 ug/kg/h via INTRAVENOUS
  Administered 2017-07-14: 0.5 ug/kg/h via INTRAVENOUS
  Filled 2017-07-13 (×5): qty 100

## 2017-07-13 MED ORDER — LIDOCAINE 5 % EX PTCH
1.0000 | MEDICATED_PATCH | CUTANEOUS | Status: DC
  Administered 2017-07-13 – 2017-07-21 (×9): 1 via TRANSDERMAL
  Filled 2017-07-13 (×9): qty 1

## 2017-07-13 MED ORDER — ATORVASTATIN CALCIUM 40 MG PO TABS
40.0000 mg | ORAL_TABLET | Freq: Every day | ORAL | Status: DC
  Administered 2017-07-13 – 2017-07-21 (×7): 40 mg via ORAL
  Filled 2017-07-13 (×8): qty 1

## 2017-07-13 NOTE — Progress Notes (Signed)
Critical care attending progress note:  Reason for admission: status post  V-fibrillation cardiac arrest      Summary of admission history and hospital course: 67 y.o.femalewith a hx of pulmonary edema after second chemo, with normal Echo, Metastatic lung cancer-stage IV-metatases to brain, sternum and spine,ulcerated lipodermatosclerosis of LLE, HTN. Admitted with nausea and vomiting and hyponatremia. V-fib arrest 07/03/17 due to STEMI. Respiratory failure due to aspiration (?) pneumonia.  Remains ventilator dependent.  The following issues were addressed on rounds today:  1.  Respiratory failure.   On PCV 14/12 5/0.4. Overbreathing set rate at times but became apneic on PSV, likely due to increased sedation. Chest is clear to auscultation. Secretions are minimal. - may be ready for SBT and extubation once we are able to find balance between comfort without respiratory suppression.  Will convert to enteral opioids and add co-analgesia to minimize sedation.  2. S/P cardiac arrest.  Currently able to follow commands and move weakly.  MRI shows patchy areas of watershed infarction and numerous cerebral metastases   3. NSCLC. Known stage IV disease with questionable response to treatment. Unclear if further treatment would materially affect her prognosis which is likely to be driven by her recent cardiac arrest. This factor should be considered when planning tracheostomy. - Oncology to see regarding treatment options at this point.  Safety and quality of care items:  Sedation vacation with dose reduction: Passed WUA while on Precedex and fentanyl. Early mobility: appropriate. VAP bundle: in place Appropriate for SBT: yes Secondary prevention CAD: ASA, Metoprolol, will start atorvastatin Line removal: L IJ triple lumen Stress ulcer prophylaxis: pantoprazole. Laxative regimen: currently having diarrhea from tube feeds. Will add metamucil. Nutritional status: sarcopenic obesity. Enteral  nutrition: Tolerating Vital HP at goal rate. Foley catheter removal: no Foley Renal dose adjustment:  Electrolyte repletion: Hypokalemia - will replete. DVT prophylaxis: Lovenox 40 daily. PLT: 272. Transfusion indications: HB 7.9 no indication for transfusion.   Antibiotic de-escalation: no antibiotics Sepsis care elements: not septic Glycemic target: 70-180.  Euglycemic on SSI Steroid use: on Steroids for COPD. No chronically on steroids. Labs/CXR ordered: CBC, BMP daily.  Summary and disposition:  Respiratory failure in patient with Stage IV lung cancer. Will attempt to avoid tracheostomy.  Patient remains critically ill due to respiratory failure requiring mechanical ventilation.  They remain at high risk for life/limb threatening clinical deterioration requiring frequent reassessment and modifications of care.  Services provided include examination of the patient, review of relevant ancillary tests, prescription of lifesaving therapies, review of medications and prophylactic therapy and multidisciplinary rounding.  Total critical care time excluding separately billable procedures: 60  Minutes.  Kipp Brood, MD Critical Care Attending. [place] [facility]. Pager: [phone] After hours: (336)

## 2017-07-14 ENCOUNTER — Inpatient Hospital Stay (HOSPITAL_COMMUNITY): Payer: BLUE CROSS/BLUE SHIELD

## 2017-07-14 ENCOUNTER — Telehealth: Payer: Self-pay | Admitting: Pharmacist

## 2017-07-14 DIAGNOSIS — J9602 Acute respiratory failure with hypercapnia: Secondary | ICD-10-CM

## 2017-07-14 DIAGNOSIS — M7989 Other specified soft tissue disorders: Secondary | ICD-10-CM

## 2017-07-14 DIAGNOSIS — J9690 Respiratory failure, unspecified, unspecified whether with hypoxia or hypercapnia: Secondary | ICD-10-CM

## 2017-07-14 LAB — CBC WITH DIFFERENTIAL/PLATELET
BASOS ABS: 0 10*3/uL (ref 0.0–0.1)
BASOS PCT: 0 %
EOS ABS: 0 10*3/uL (ref 0.0–0.7)
Eosinophils Relative: 0 %
HCT: 27.8 % — ABNORMAL LOW (ref 36.0–46.0)
Hemoglobin: 8.4 g/dL — ABNORMAL LOW (ref 12.0–15.0)
Lymphocytes Relative: 8 %
Lymphs Abs: 0.6 10*3/uL — ABNORMAL LOW (ref 0.7–4.0)
MCH: 30 pg (ref 26.0–34.0)
MCHC: 30.2 g/dL (ref 30.0–36.0)
MCV: 99.3 fL (ref 78.0–100.0)
MONO ABS: 0.3 10*3/uL (ref 0.1–1.0)
Monocytes Relative: 4 %
Neutro Abs: 6.6 10*3/uL (ref 1.7–7.7)
Neutrophils Relative %: 88 %
PLATELETS: 315 10*3/uL (ref 150–400)
RBC: 2.8 MIL/uL — ABNORMAL LOW (ref 3.87–5.11)
RDW: 16.5 % — AB (ref 11.5–15.5)
WBC: 7.4 10*3/uL (ref 4.0–10.5)

## 2017-07-14 LAB — GLUCOSE, CAPILLARY
GLUCOSE-CAPILLARY: 123 mg/dL — AB (ref 65–99)
GLUCOSE-CAPILLARY: 96 mg/dL (ref 65–99)
GLUCOSE-CAPILLARY: 118 mg/dL — AB (ref 65–99)
GLUCOSE-CAPILLARY: 115 mg/dL — AB (ref 65–99)
Glucose-Capillary: 137 mg/dL — ABNORMAL HIGH (ref 65–99)
Glucose-Capillary: 75 mg/dL (ref 65–99)

## 2017-07-14 LAB — BASIC METABOLIC PANEL
Anion gap: 10 (ref 5–15)
BUN: 46 mg/dL — ABNORMAL HIGH (ref 6–20)
CALCIUM: 9 mg/dL (ref 8.9–10.3)
CO2: 29 mmol/L (ref 22–32)
CREATININE: 0.75 mg/dL (ref 0.44–1.00)
Chloride: 110 mmol/L (ref 101–111)
GLUCOSE: 145 mg/dL — AB (ref 65–99)
Potassium: 4.9 mmol/L (ref 3.5–5.1)
Sodium: 149 mmol/L — ABNORMAL HIGH (ref 135–145)

## 2017-07-14 MED ORDER — CHLORHEXIDINE GLUCONATE CLOTH 2 % EX PADS
6.0000 | MEDICATED_PAD | Freq: Every day | CUTANEOUS | Status: DC
  Administered 2017-07-15 – 2017-07-21 (×8): 6 via TOPICAL

## 2017-07-14 MED ORDER — SODIUM CHLORIDE 0.9% FLUSH
10.0000 mL | Freq: Two times a day (BID) | INTRAVENOUS | Status: DC
  Administered 2017-07-14 (×2): 10 mL
  Administered 2017-07-15: 30 mL
  Administered 2017-07-15 – 2017-07-16 (×2): 10 mL

## 2017-07-14 MED ORDER — LABETALOL HCL 5 MG/ML IV SOLN
10.0000 mg | INTRAVENOUS | Status: DC | PRN
  Administered 2017-07-14 – 2017-07-15 (×2): 10 mg via INTRAVENOUS
  Filled 2017-07-14 (×2): qty 4

## 2017-07-14 MED ORDER — DEXTROSE 5 % IV SOLN
INTRAVENOUS | Status: DC
  Administered 2017-07-14 – 2017-07-16 (×3): via INTRAVENOUS

## 2017-07-14 MED ORDER — SODIUM CHLORIDE 0.9% FLUSH: 10.0000 mL | INTRAVENOUS | Status: DC | PRN

## 2017-07-14 MED ORDER — ONDANSETRON HCL 4 MG/2ML IJ SOLN
4.0000 mg | Freq: Three times a day (TID) | INTRAMUSCULAR | Status: DC | PRN
  Administered 2017-07-14 – 2017-07-18 (×2): 4 mg via INTRAVENOUS
  Filled 2017-07-14 (×3): qty 2

## 2017-07-14 NOTE — Progress Notes (Signed)
Right upper extremity venous duplex completed. Technically difficult due to constant movement of the arm. There is no obvious evidence of a DVT. Positive for  superficial thrombus of the basilic and cephalic veins. Jennifer Delgado,RVS 07/14/2017, 4:00 pm

## 2017-07-14 NOTE — Progress Notes (Signed)
Critical care attending progress note:  Reason for admission: status post  V-fibrillation cardiac arrest      Summary of admission history and hospital course: 67 y.o.femalewith a hx of pulmonary edema after second chemo, with normal Echo, Metastatic lung cancer-stage IV-metatases to brain, sternum and spine,ulcerated lipodermatosclerosis of LLE, HTN. Admitted with nausea and vomiting and hyponatremia. V-fib arrest 07/03/17 due to STEMI. Respiratory failure due to aspiration (?) pneumonia.  Remains ventilator dependent.  The following issues were addressed on rounds today:  1.  Respiratory failure.   On PCV 14/12 5/0.4. Overbreathing set rate at times but became apneic on PSV, likely due to increased sedation. Chest is clear to auscultation. Secretions are minimal.  Successful SBT today.  Patient follows commands predictably and has reasonable 4-/5 strength -Successfully extubated today. -Progressive ambulation. -Swallowing evaluation ASAP to allow for loratinib administration.  2. S/P cardiac arrest.  Currently able to follow commands and move weakly.  MRI shows patchy areas of watershed infarction and numerous cerebral metastases   3. NSCLC. Known stage IV disease with questionable response to treatment. Unclear if further treatment would materially affect her prognosis which is likely to be driven by her recent cardiac arrest. This factor should be considered when planning tracheostomy. - Dr Julien Nordmann has seen the patient and will advise.   Safety and quality of care items:  Sedation vacation with dose reduction: Passed WUA while on Precedex and fentanyl. Early mobility: appropriate. VAP bundle: in place Appropriate for SBT: yes Secondary prevention CAD: ASA, Metoprolol, will start atorvastatin Line removal: L IJ triple lumen converted to PICC line  Stress ulcer prophylaxis: pantoprazole. Laxative regimen: currently having diarrhea from tube feeds. Will add metamucil. Nutritional  status: sarcopenic obesity. Enteral nutrition: Tolerating Vital HP at goal rate. Foley catheter removal: no Foley Renal dose adjustment:  Electrolyte repletion: Hypokalemia - will replete. DVT prophylaxis: Lovenox 40 daily. PLT: 315. Transfusion indications: HB 8.4 no indication for transfusion.   Antibiotic de-escalation: no antibiotics Sepsis care elements: not septic Glycemic target: 70-180.  Euglycemic on SSI Steroid use: on Steroids for COPD. No chronically on steroids. Labs/CXR ordered: CBC, BMP daily.  Summary and disposition:  Respiratory failure in patient with Stage IV lung cancer. Will attempt to avoid tracheostomy.  Patient remains critically ill due to respiratory failure requiring mechanical ventilation.  They remain at high risk for life/limb threatening clinical deterioration requiring frequent reassessment and modifications of care.  Services provided include examination of the patient, review of relevant ancillary tests, prescription of lifesaving therapies, review of medications and prophylactic therapy and multidisciplinary rounding.  Total critical care time excluding separately billable procedures: 60  Minutes.   Kipp Brood, MD Critical Care Attending. [place] [facility]. Pager: [phone] After hours: (336)

## 2017-07-14 NOTE — Progress Notes (Signed)
Per IV nurse, PICC line ready to use. Will DC IJ catheter.

## 2017-07-14 NOTE — Evaluation (Signed)
SLP Cancellation Note  Patient Details Name: Jennifer Delgado MRN: [id number] DOB: Feb 06, 1951   Cancelled treatment:       Reason Eval/Treat Not Completed: Medical issues which prohibited therapy(pt just returned to vent, will continue efforts)   Macario Golds 07/14/2017, 12:03 PM

## 2017-07-14 NOTE — Progress Notes (Signed)
30 mL of fentanyl drip wasted in sink, witnessed by Karie Kirks, RN.

## 2017-07-14 NOTE — Progress Notes (Signed)
Rt went to do vent check and pt was placed back on full support by MD.

## 2017-07-14 NOTE — Telephone Encounter (Signed)
Oral Oncology Pharmacist Encounter  Received new referral for continuation of Lobrena (lorlatinib) for the treatment of progressive, metastatic NSCLC that is ALK-rearrangement postive, planned duration until disease progression or unacceptable toxicity.  Patient noted to have brain metastasis, with progressive disease status post treatment with crizotinib and alectinib. Patient with recent completion of CyberKnife treatments for brain metastasis at [facility] in March 2019.  Patient admitted through the ED on 06/29/2017 for intractable nausea and vomiting that had started several days prior. Patient treated for hypovolemic hyponatremia Patient been noted with cardiac arrest on 07/03/2017 due to ventricular fibrillation and CPR was initiated.  ROSC after 30 minutes.  Patient was intubated at that time. Patient with multiple inpatient issues, found to have multiple infarcts on CT head, NSTEMI, and aspiration pneumonia.  Patient is currently admitted to ICU status post acute MI, incomplete right bundle branch block  Labs from Epic assessed. PR interval on 4/6 EKG 156 msec Lipid profile in Care Everywhere shows baseline hypercholesterolemia, is being monitored  Current medication list in Epic reviewed, DDIs with [place] identified:  [place] and amiodarone: Category D interaction: Hermelinda Medicus is a CYP3A4 inducer which may lead to increased metabolism and decreased systemic exposure to the amiodarone.  Amiodarone has recently to patient's medications s/p cardiac arrest from ventricular fibrillation.  Interaction will be discussed with MD.  Hermelinda Medicus and isosorbide mononitrate: Category D interaction: Due to CYP 3 A4 induction systemic exposure to isosorbide may be decreased by con commitment use with [place].  Patient on this combination of medications prior to admit.  No change to current therapy indicated at this time.  Patient already has a supply of [place]. Prescribing information of the Lorbrena states  to not crush or break tablets. Inquiry has been submitted with [facility] medical information due to current intubation for alternate methods of administration and need to administer through NG tube.  Once information is received from manufacturer, oral oncology clinic will work with MD and inpatient staff to facilitate administration of the [place].  Oral oncology clinic will continue to follow.  Johny Drilling, PharmD, BCPS, BCOP 07/14/2017 10:43 AM Oral Oncology Clinic [phone]

## 2017-07-14 NOTE — Progress Notes (Signed)
Peripherally Inserted Central Catheter/Midline Placement  The IV Nurse has discussed with the patient and/or persons authorized to consent for the patient, the purpose of this procedure and the potential benefits and risks involved with this procedure.  The benefits include less needle sticks, lab draws from the catheter, and the patient may be discharged home with the catheter. Risks include, but not limited to, infection, bleeding, blood clot (thrombus formation), and puncture of an artery; nerve damage and irregular heartbeat and possibility to perform a PICC exchange if needed/ordered by physician.  Alternatives to this procedure were also discussed.  Bard Power PICC patient education guide, fact sheet on infection prevention and patient information card has been provided to patient /or left at bedside.  Consent signed by husband due to altered mental status.  PICC/Midline Placement Documentation  PICC Double Lumen 07/14/17 PICC Left Brachial 45 cm 1 cm (Active)  Indication for Insertion or Continuance of Line Poor Vasculature-patient has had multiple peripheral attempts or PIVs lasting less than 24 hours;Limited venous access - need for IV therapy >5 days (PICC only) 07/14/2017  2:27 PM  Exposed Catheter (cm) 1 cm 07/14/2017  2:27 PM  Site Assessment Clean;Dry;Intact 07/14/2017  2:27 PM  Lumen #1 Status Flushed;Saline locked;Blood return noted 07/14/2017  2:27 PM  Lumen #2 Status Flushed;Saline locked;Blood return noted 07/14/2017  2:27 PM  Dressing Type Transparent 07/14/2017  2:27 PM  Dressing Status Clean;Dry;Intact;Antimicrobial disc in place 07/14/2017  2:27 PM  Dressing Change Due 07/21/17 07/14/2017  2:27 PM       Lorilei Horan, Nicolette Bang 07/14/2017, 2:28 PM

## 2017-07-14 NOTE — Procedures (Signed)
Extubation Procedure Note  Patient Details:   Name: Jennifer Delgado DOB: [birth year] MRN: [id number]   Airway Documentation:     Evaluation  O2 sats: stable throughout Complications: No apparent complications Patient did tolerate procedure well. Bilateral Breath Sounds: Clear, Diminished   Yes  Johnette Abraham 07/14/2017, 12:07 PM

## 2017-07-14 NOTE — Consult Note (Signed)
[place] [phone]   [phone]  CONSULT NOTE  REFERRING PHYSICIAN: Dr. Kipp Brood  REASON FOR CONSULTATION:  67 years old white female with metastatic lung cancer and respiratory failure.  HPI Jennifer Delgado is a 67 y.o. female with past medical history significant for hypertension and diagnosis of stage IV non-small cell lung cancer, adenocarcinoma in October 2016.  Molecular studies at that time showed positive ALK gene translocation.  The patient was a started on treatment with Xalkori until November 03, 2015 when she was found to have disease progression.  Her treatment was switched to Alecensa (Alectinib) with partial response.  Recent imaging studies with CT scan of the chest, abdomen and pelvis in February 2019 showed a stable appearing 1.8 cm right lower lobe opacity.  There was diffuse bilateral groundglass opacities slightly increased from the prior exam with new scattered foci in the left lung concerning for multifocal infection and there was interval decrease in the prominence of the left axillary and subpectoral lymph nodes.  CT of the abdomen on 05/08/2017 showed interval increase in the previously seen right hepatic lesion with new right hepatic lesion suspicious for metastasis.  There was new compression deformity of T12 vertebral body with approximately 30% height loss.  There was also periportal left external iliac and left inguinal adenopathy similar to before.  MRI of the brain on 05/19/2017 showed 4 subcentimeter right frontal lobe and left parietal lobe enhancing foci suspicious for metastatic disease.  The patient was a started on treatment with Lorlatinib.  She received around 10 days of the treatment.  Her treatment was interrupted because of her stereotactic radiotherapy to the metastatic brain lesions.  The patient presented to the emergency department at the [facility] complaining of nausea and vomiting and  dehydration.  She was found to have significant hyponatremia.  She was admitted to [facility] for further evaluation and management of her condition.  Unfortunately a few days after her hospitalization the patient had cardiac arrest and she was successfully resuscitated.  She has been intubated for the last 10 days.  She is slowly improving.  She was seen by cardiology but she is not a candidate for cardiac catheterization because of her current condition. I was consulted today to give an opinion about the patient his current condition, prognosis and consideration for future treatment options. When seen today the patient was still intubated but she was alert and able to nod her head for questions.  Her husband was at the bedside and he provided a significant amount of the history in addition to the medical record.  I also reached out to Dr. Truman Hayward at [facility] and obtain more history from her. The patient is a non-smoker and she used to work as a Nutritional therapist at [employer].  HPI  Past Medical History:  Diagnosis Date  . Brain cancer ([place])   . Cancer ([place])    lung  . Cellulitis   . Hypertension   . Liver cancer ([place])   . Renal disorder     Past Surgical History:  Procedure Laterality Date  . BRONCHOSCOPY    . LEG SURGERY    . THORACENTESIS      No family history on file.  Social History Social History   Tobacco Use  . Smoking status: Never Smoker  . Smokeless tobacco: Never Used  Substance Use Topics  . Alcohol use: Yes    Comment:  occ  . Drug use: No    Allergies  Allergen Reactions  . Daptomycin Other (See Comments)    Rhyabdomyolosis  . Dilaudid [Hydromorphone] Nausea And Vomiting    Current Facility-Administered Medications  Medication Dose Route Frequency Provider Last Rate Last Dose  . 0.9 %  sodium chloride infusion   Intravenous Continuous Chesley Mires, MD 10 mL/hr at 07/14/17 0600    . acetaminophen (TYLENOL) solution 650 mg  650 mg Oral  Q6H Kipp Brood, MD   650 mg at 07/14/17 0941  . acetaminophen (TYLENOL) tablet 650 mg  650 mg Per Tube Q6H PRN Chesley Mires, MD   650 mg at 07/07/17 2142   Or  . acetaminophen (TYLENOL) suppository 650 mg  650 mg Rectal Q6H PRN Chesley Mires, MD      . amiodarone (PACERONE) tablet 400 mg  400 mg Per NG tube Daily Croitoru, Mihai, MD   400 mg at 07/14/17 0940  . arformoterol (BROVANA) nebulizer solution 15 mcg  15 mcg Nebulization BID Simonne Maffucci B, MD   15 mcg at 07/14/17 0927  . aspirin chewable tablet 81 mg  81 mg Per Tube Daily Chesley Mires, MD   81 mg at 07/14/17 0940  . atorvastatin (LIPITOR) tablet 40 mg  40 mg Oral q1800 Kipp Brood, MD   40 mg at 07/13/17 1826  . bisacodyl (DULCOLAX) suppository 10 mg  10 mg Rectal Daily PRN Chesley Mires, MD      . chlorhexidine (PERIDEX) 0.12 % solution 15 mL  15 mL Mouth Rinse BID Dessa Phi, DO   15 mL at 07/14/17 0949  . Chlorhexidine Gluconate Cloth 2 % PADS 6 each  6 each Topical Daily Chesley Mires, MD   6 each at 07/13/17 0526  . dexmedetomidine (PRECEDEX) 400 MCG/100ML (4 mcg/mL) infusion  0.4-1.2 mcg/kg/hr Intravenous Continuous Juanito Doom, MD   Stopped at 07/14/17 0802  . diclofenac sodium (VOLTAREN) 1 % transdermal gel 2 g  2 g Topical QID PRN Smith, Rondell A, MD      . enoxaparin (LOVENOX) injection 40 mg  40 mg Subcutaneous Q24H Smith, Rondell A, MD   40 mg at 07/14/17 0945  . feeding supplement (VITAL HIGH PROTEIN) liquid 1,000 mL  1,000 mL Per Tube Continuous Chesley Mires, MD 55 mL/hr at 07/14/17 0800 1,000 mL at 07/14/17 0800  . fentaNYL (SUBLIMAZE) bolus via infusion 25 mcg  25 mcg Intravenous Q1H PRN Erick Colace, NP   25 mcg at 07/12/17 2156  . fentaNYL (SUBLIMAZE) injection 50 mcg  50 mcg Intravenous Q2H PRN Erick Colace, NP   50 mcg at 07/12/17 0529  . fentaNYL 2561mg in NS 2540m(102mml) infusion-PREMIX  25-400 mcg/hr Intravenous Continuous SooChesley MiresD 5 mL/hr at 07/14/17 0803 50 mcg/hr at  07/14/17 0803  . gabapentin (NEURONTIN) 250 MG/5ML solution 450 mg  450 mg Per Tube QHS BabErick ColaceP   450 mg at 07/13/17 2254  . hydrALAZINE (APRESOLINE) injection 10 mg  10 mg Intravenous Q4H PRN SomAnders SimmondsD   10 mg at 07/09/17 1305  . insulin aspart (novoLOG) injection 3-9 Units  3-9 Units Subcutaneous Q4H SooChesley MiresD   3 Units at 07/14/17 0354  . ipratropium-albuterol (DUONEB) 0.5-2.5 (3) MG/3ML nebulizer solution 3 mL  3 mL Nebulization Q4H PRN SooChesley MiresD      . ketorolac (TORADOL) 15 MG/ML injection 15 mg  15 mg Intravenous Q8H Agarwala, RavEinar GradD   15 mg at 07/14/17  0555  . lidocaine (LIDODERM) 5 % 1 patch  1 patch Transdermal Q24H Kipp Brood, MD   1 patch at 07/13/17 1810  . MEDLINE mouth rinse  15 mL Mouth Rinse q12n4p Dessa Phi, DO   15 mL at 07/13/17 1505  . methylPREDNISolone sodium succinate (SOLU-MEDROL) 40 mg/mL injection 20 mg  20 mg Intravenous BID Chesley Mires, MD   20 mg at 07/14/17 0945  . metoprolol tartrate (LOPRESSOR) 25 mg/10 mL oral suspension 25 mg  25 mg Per Tube BID Thompson Grayer, MD   25 mg at 07/13/17 2121  . multivitamin liquid 15 mL  15 mL Per Tube Daily Chesley Mires, MD   15 mL at 07/14/17 0942  . pantoprazole sodium (PROTONIX) 40 mg/20 mL oral suspension 40 mg  40 mg Per Tube Q24H Chesley Mires, MD   40 mg at 07/13/17 1318  . sertraline (ZOLOFT) tablet 100 mg  100 mg Per Tube QHS Erick Colace, NP   100 mg at 07/13/17 2122  . sodium chloride flush (NS) 0.9 % injection 10-40 mL  10-40 mL Intracatheter PRN Chesley Mires, MD        Review of Systems  Constitutional: positive for fatigue Eyes: negative Ears, nose, mouth, throat, and face: negative Respiratory: positive for dyspnea on exertion Cardiovascular: negative Gastrointestinal: negative Genitourinary:negative Integument/breast: negative Hematologic/lymphatic: negative Musculoskeletal:negative Neurological: negative Behavioral/Psych: negative Endocrine:  negative Allergic/Immunologic: negative  Physical Exam  PHX:TAVWP and mild distress SKIN: skin color, texture, turgor are normal HEAD: Normocephalic, No masses, lesions, tenderness or abnormalities EYES: normal, PERRLA EARS: External ears normal, Canals clear OROPHARYNX:no exudate, no erythema and lips, buccal mucosa, and tongue normal  NECK: supple, no adenopathy LYMPH:  no palpable lymphadenopathy, no hepatosplenomegaly BREAST:not examined LUNGS: scattered rhonchi bilaterally, scattered rales bilaterally HEART: regular rate & rhythm, no murmurs and no gallops ABDOMEN:abdomen soft, non-tender, obese, normal bowel sounds and no masses or organomegaly BACK: Back symmetric, no curvature., No CVA tenderness EXTREMITIES:no joint deformities, effusion, or inflammation, no edema  NEURO: no focal motor/sensory deficits  LABORATORY DATA: Lab Results  Component Value Date   WBC 7.4 07/14/2017   HGB 8.4 (L) 07/14/2017   HCT 27.8 (L) 07/14/2017   MCV 99.3 07/14/2017   PLT 315 07/14/2017    _0 @  RADIOGRAPHIC STUDIES: Dg Chest 1 View  Result Date: 07/03/2017 CLINICAL DATA:  67 year old female with central line placement. EXAM: ABDOMEN - 1 VIEW; CHEST  1 VIEW COMPARISON:  Chest radiograph dated 07/03/2017 FINDINGS: Endotracheal tube above the carina in similar position. Enteric tube extends into the stomach with tip likely located in the distal stomach. There has been interval placement of a left IJ central venous line with tip over central SVC at the level of the right hilum. No pneumothorax. Cardiomegaly with interstitial edema with more confluent airspace opacity in the right lung most likely representing asymmetric pulmonary edema although pneumonia is not excluded. Clinical correlation is recommended. Overall interval worsening of the airspace disease compared to the prior radiograph. Probable small right pleural effusion. No acute osseous pathology. IMPRESSION: 1. Interval placement  of a left IJ central line with tip over central SVC. No pneumothorax. Endotracheal tube above the carina and enteric tube likely in the distal stomach. 2. Cardiomegaly with findings of CHF. Pneumonia is not excluded. Clinical correlation is recommended. Electronically Signed   By: Anner Crete M.D.   On: 07/03/2017 05:26   Dg Abd 1 View  Result Date: 07/06/2017 CLINICAL DATA:  67 year old female status post OG tube  placement. EXAM: ABDOMEN - 1 VIEW COMPARISON:  07/03/2017.  Portable chest 0456 hours today. FINDINGS: Portable AP supine view at 1110 hours. Enteric tube courses to the left upper quadrant, tip at the level of the mid stomach, and the side hole projects at the level of the proximal gastric body. Paucity of bowel gas, nonobstructed gas pattern. Stable lung bases. No acute osseous abnormality identified. IMPRESSION: Enteric tube within the stomach, side hole at the level of the proximal gastric body. Electronically Signed   By: Genevie Ann M.D.   On: 07/06/2017 11:58   Dg Abd 1 View  Result Date: 07/03/2017 CLINICAL DATA:  67 year old female with central line placement. EXAM: ABDOMEN - 1 VIEW; CHEST  1 VIEW COMPARISON:  Chest radiograph dated 07/03/2017 FINDINGS: Endotracheal tube above the carina in similar position. Enteric tube extends into the stomach with tip likely located in the distal stomach. There has been interval placement of a left IJ central venous line with tip over central SVC at the level of the right hilum. No pneumothorax. Cardiomegaly with interstitial edema with more confluent airspace opacity in the right lung most likely representing asymmetric pulmonary edema although pneumonia is not excluded. Clinical correlation is recommended. Overall interval worsening of the airspace disease compared to the prior radiograph. Probable small right pleural effusion. No acute osseous pathology. IMPRESSION: 1. Interval placement of a left IJ central line with tip over central SVC. No  pneumothorax. Endotracheal tube above the carina and enteric tube likely in the distal stomach. 2. Cardiomegaly with findings of CHF. Pneumonia is not excluded. Clinical correlation is recommended. Electronically Signed   By: Anner Crete M.D.   On: 07/03/2017 05:26   Ct Head Wo Contrast  Result Date: 07/03/2017 CLINICAL DATA:  67 y/o F; history of metastatic non-small cell cancer with brain metastasis. Cardiac arrest. EXAM: CT HEAD WITHOUT CONTRAST TECHNIQUE: Contiguous axial images were obtained from the base of the skull through the vertex without intravenous contrast. COMPARISON:  12/31/2017 CT of the head. FINDINGS: Brain: New small areas of cortical hypoattenuation in the right parietal lobe (series 2: Image 21), right occipital lobe 2:15, left posterior temporal lobe 2:14, right frontal lobe 2:13, and bilateral cerebellar hemispheres 2:9 and 8. Cortical and basal ganglia gray-white differentiation is maintained at this time. No hemorrhage or focal mass effect. Stable small lucencies in the left frontal lobe. No extra-axial collection, hydrocephalus, or effacement of basilar cisterns. Stable mild brain parenchymal volume loss. Vascular: Calcific atherosclerosis of carotid siphons. No hyperdense vessel. Skull: Normal. Negative for fracture or focal lesion. Sinuses/Orbits: No acute finding. Other: None. IMPRESSION: 1. Several new small areas of hypoattenuation are present in right frontal, right parietal, right occipital, left posterior temporal lobes as well as bilateral cerebellar hemispheres, probably representing interval infarctions. Multiple vascular territories suggests embolic phenomenon. 2. Stable lucency in left frontal lobe probably representing chronic microvascular ischemic change, possibly treated metastasis. These results will be called to the ordering clinician or representative by the Radiologist Assistant, and communication documented in the PACS or zVision Dashboard. Electronically  Signed   By: Kristine Garbe M.D.   On: 07/03/2017 18:00   Ct Head Wo Contrast  Result Date: 06/30/2017 CLINICAL DATA:  67 y/o F; lung cancer with liver and brain metastasis. Post chemotherapy and [employer]. Five days of headaches. EXAM: CT HEAD WITHOUT CONTRAST TECHNIQUE: Contiguous axial images were obtained from the base of the skull through the vertex without intravenous contrast. COMPARISON:  None. FINDINGS: Brain: No evidence of acute  infarction, hemorrhage, hydrocephalus, extra-axial collection or focal mass effect. Small foci of lucency are present in white matter and in the left frontal cortex without mass effect. Mild diffuse brain parenchymal volume loss. Vascular: Calcific atherosclerosis of carotid siphons. No hyperdense vessel. Skull: Normal. Negative for fracture or focal lesion. Sinuses/Orbits: No acute finding. Other: None. IMPRESSION: 1. No acute intracranial abnormality.  No focal mass effect. 2. Several small areas of lucency in white matter in the left frontal cortex may represent chronic microvascular ischemic changes or possibly treated metastasis. MRI of the brain with and without contrast can better characterize if clinically indicated. 3. Mild brain parenchymal volume loss. Electronically Signed   By: Kristine Garbe M.D.   On: 06/30/2017 02:42   Ct Chest Wo Contrast  Result Date: 07/03/2017 CLINICAL DATA:  Metastatic non-small cell lung cancer with metastasis to brain, hospitalized with hyponatremia, nausea, and vomiting, underwent cardiac arrest with CPR EXAM: CT CHEST WITHOUT CONTRAST TECHNIQUE: Multidetector CT imaging of the chest was performed following the standard protocol without IV contrast. Sagittal and coronal MPR images reconstructed from axial data set. Extensive beam hardening artifacts from inclusion of patient's arms in imaged field. COMPARISON:  None FINDINGS: Cardiovascular: Aorta normal caliber. Minimal pericardial effusion. Heart otherwise  unremarkable. LEFT jugular line with tip in SVC. Mediastinum/Nodes: Tip of endotracheal tube above carina. Nasogastric tube coiled in stomach. Esophagus unremarkable. No definite thoracic adenopathy though hilar assessment is limited by the lack of IV contrast. Base of cervical region grossly unremarkable. Lungs/Pleura: Extensive BILATERAL pulmonary infiltrates greatest in LEFT lower lobe. Small to moderate RIGHT pleural effusion. Unable to exclude underlying mass lesions due to the severity of observed infiltrates. Upper Abdomen: Visualized upper abdomen unremarkable Musculoskeletal: BILATERAL anterior rib fractures which may related to CPR. Bones demineralized. Mid sternal fracture. IMPRESSION: Extensive BILATERAL pulmonary infiltrates which could be related to infection or aspiration. Small to moderate low-attenuation RIGHT pleural effusion. BILATERAL rib and sternal fractures question related to CPR/resuscitation. Small pericardial effusion. Electronically Signed   By: Lavonia Dana M.D.   On: 07/03/2017 18:01   Mr Jeri Cos QP Contrast  Result Date: 07/12/2017 CLINICAL DATA:  Follow-up stroke. History of metastatic lung cancer and recent cardiac arrest. EXAM: MRI HEAD WITHOUT AND WITH CONTRAST TECHNIQUE: Multiplanar, multiecho pulse sequences of the brain and surrounding structures were obtained without and with intravenous contrast. CONTRAST:  57m MULTIHANCE GADOBENATE DIMEGLUMINE 529 MG/ML IV SOLN COMPARISON:  CT HEAD July 03, 2017. FINDINGS: INTRACRANIAL CONTENTS: Patchy reduced diffusion bilateral frontoparietal lobes and LEFT greater than RIGHT occipital lobes with nearly normalized ADC values, wispy enhancement and FLAIR T2 hyperintense signal. In addition, multiple less than 5 mm solidly enhancing and slight subcentimeter rim enhancing nodules numbering at least 15 in the supra-and infratentorial brain predominately at gray-white matter junction. No susceptibility artifact to suggest hemorrhage. No  midline shift or mass effect. Additional. Very minimal suspected chronic small vessel ischemic disease. Mild parenchymal brain volume loss without hydrocephalus. No abnormal extra-axial fluid collections or masses. VASCULAR: Normal major intracranial vascular flow voids present at skull base. SKULL AND UPPER CERVICAL SPINE: No abnormal sellar expansion. Heterogeneous calvarial bone marrow signal without focal signal abnormality or enhancement. Craniocervical junction maintained. SINUSES/ORBITS: Trace paranasal sinus mucosal thickening. Hypoplastic bilateral mastoid air cells with effusion. Nasal septum perforation. The included ocular globes and orbital contents are non-suspicious. OTHER: None. IMPRESSION: 1. Numerous supra- and infratentorial early subacute mildly enhancing infarcts attributable to hypoperfusion/cardiac arrest, though also seen with embolic phenomena. 2. Numerous small supra-  and infratentorial metastasis. 3. Mild parenchymal brain volume loss. Electronically Signed   By: Elon Alas M.D.   On: 07/12/2017 15:18   Dg Chest Port 1 View  Result Date: 07/12/2017 CLINICAL DATA:  Respiratory failure EXAM: PORTABLE CHEST 1 VIEW COMPARISON:  07/11/2017 FINDINGS: Cardiac shadow is stable. Endotracheal tube, nasogastric catheter and left jugular central line are again seen and stable. Persistent right-sided pleural effusion with basilar atelectasis. Is seen. Left basilar atelectasis has increased in the interval. IMPRESSION: Increasing changes in the left base. Electronically Signed   By: Inez Catalina M.D.   On: 07/12/2017 07:44   Dg Chest Port 1 View  Result Date: 07/11/2017 CLINICAL DATA:  Respiratory failure. EXAM: PORTABLE CHEST 1 VIEW COMPARISON:  07/10/2017. FINDINGS: Endotracheal tube and orogastric tube in stable position. Left IJ line stable position. Left IJ line Skene at the insertion site. Heart size stable. Persistent but improved bilateral pulmonary infiltrates/edema. Persistent  but improved small right pleural effusion. No pneumothorax. IMPRESSION: 1.  Lines and tubes in stable position. 2. Persistent but improved bilateral pulmonary infiltrates/edema. Persistent but improved small right-sided pleural effusion Electronically Signed   By: Marcello Moores  Register   On: 07/11/2017 07:24   Dg Chest Port 1 View  Result Date: 07/10/2017 CLINICAL DATA:  Respiratory failure EXAM: PORTABLE CHEST 1 VIEW COMPARISON:  Yesterday FINDINGS: Endotracheal tube tip at the clavicular heads. An orogastric tube reaches the stomach at least. Left IJ catheter, tip at the SVC origin. Diffuse airspace opacity with layering pleural fluid on the right. Chronic cardiomegaly. No visible pneumothorax. No convincing improvement. IMPRESSION: 1. Stable positioning of hardware. 2. Extensive airspace disease with layering pleural fluid on the right. No interval change. Electronically Signed   By: Monte Fantasia M.D.   On: 07/10/2017 07:08   Dg Chest Port 1 View  Result Date: 07/09/2017 CLINICAL DATA:  Respiratory failure EXAM: PORTABLE CHEST 1 VIEW COMPARISON:  07/08/2017 FINDINGS: Endotracheal tube in good position. NG tube enters the stomach with the tip not visualized. Left jugular central venous catheter tip in the SVC. No pneumothorax Severe diffuse bilateral airspace disease shows mild progression. Bilateral pleural effusions. IMPRESSION: Endotracheal tube remains in good position Severe diffuse bilateral airspace disease with mild progression. Electronically Signed   By: Franchot Gallo M.D.   On: 07/09/2017 08:11   Dg Chest Port 1 View  Result Date: 07/08/2017 CLINICAL DATA:  Pneumonia EXAM: PORTABLE CHEST 1 VIEW COMPARISON:  07/07/2017 FINDINGS: Endotracheal tube in good position. Left jugular central venous catheter tip in the SVC unchanged in position. NG tube enters the stomach. Severe diffuse bilateral airspace disease with progression. Mild to moderate right pleural effusion unchanged. Bibasilar volume loss  right greater than left unchanged IMPRESSION: Support lines remain in satisfactory position Severe bilateral airspace disease with progression. Electronically Signed   By: Franchot Gallo M.D.   On: 07/08/2017 07:13   Dg Chest Port 1 View  Result Date: 07/07/2017 CLINICAL DATA:  Hypoxia EXAM: PORTABLE CHEST 1 VIEW COMPARISON:  July 06, 2017 FINDINGS: Endotracheal tube tip is 3.9 cm above the carina. Left jugular catheter tip is at the junction of the left innominate vein and superior vena cava. Nasogastric tube tip and side port below the diaphragm. No pneumothorax. There are pleural effusions bilaterally, larger on the right than on the left. There is moderate interstitial and patchy alveolar edema, stable. There is cardiomegaly with pulmonary venous hypertension. No evident adenopathy. No bone lesions. IMPRESSION: Tube and catheter positions as described without pneumothorax. Pulmonary  vascular congestion with pleural effusions bilaterally and diffuse pulmonary edema. Suspect underlying congestive heart failure. Cannot exclude superimposed pneumonia in areas of alveolar opacity. There may well be pneumonia and congestive heart failure present concurrently. Electronically Signed   By: Lowella Grip III M.D.   On: 07/07/2017 07:32   Dg Chest Port 1 View  Result Date: 07/06/2017 CLINICAL DATA:  Respiratory failure EXAM: PORTABLE CHEST 1 VIEW COMPARISON:  07/05/2017 FINDINGS: Endotracheal tube in good position. Left jugular catheter tip in the SVC unchanged. NG in the stomach. Diffuse bilateral airspace disease unchanged. Progression of right lower lobe atelectasis/infiltrate. No change in left lower lobe atelectasis. IMPRESSION: Diffuse bilateral airspace disease unchanged most likely edema Bibasilar atelectasis. Progression of right lower lobe atelectasis and right effusion. Electronically Signed   By: Franchot Gallo M.D.   On: 07/06/2017 07:47   Dg Chest Port 1 View  Result Date: 07/05/2017 CLINICAL  DATA:  Respiratory failure. EXAM: PORTABLE CHEST 1 VIEW COMPARISON:  One-view chest x-ray 07/04/2017 FINDINGS: Heart is enlarged. Endotracheal tube left IJ line and NG tube are stable. Interstitial edema and bilateral effusions are unchanged. Bibasilar airspace disease likely reflects atelectasis. IMPRESSION: 1. Stable appearance of congestive heart failure. 2. Support apparatus is stable. Electronically Signed   By: San Morelle M.D.   On: 07/05/2017 08:58   Dg Chest Port 1 View  Result Date: 07/04/2017 CLINICAL DATA:  resp failure EXAM: PORTABLE CHEST 1 VIEW COMPARISON:  Chest radiograph and CT 07/03/2017. FINDINGS: ET tube 4.5 cm above carina. BILATERAL pulmonary opacities are improved. Significant residual lower lobe opacities remain. Orogastric tube tip not visualized. No pneumothorax. Rib fractures better demonstrated on CT. IMPRESSION: Improved aeration. Electronically Signed   By: Staci Righter M.D.   On: 07/04/2017 07:03   Dg Chest Port 1 View  Result Date: 07/03/2017 CLINICAL DATA:  Endotracheal tube placement. Dyspnea and respiratory failure. EXAM: PORTABLE CHEST 1 VIEW COMPARISON:  None. FINDINGS: Portable AP supine view of the chest. Cardiomegaly with interstitial edema. Slightly more confluent airspace opacities in the right upper and lower lung. Endotracheal tube tip is 4.2 cm above the carina in satisfactory position. External defibrillator paddles project over the cardiac silhouette. No acute osseous abnormality. IMPRESSION: 1. Endotracheal tube tip is satisfactory in position approximately 4.2 cm above the carina. 2. Cardiomegaly with interstitial edema. Slightly more confluent airspace opacity in the right upper and lower. Superimposed CHF or pneumonia may contribute to this appearance. Electronically Signed   By: Ashley Royalty M.D.   On: 07/03/2017 03:19   [place] Ekg Site Rite  Result Date: 07/13/2017 If Site Rite image not attached, placement could not be confirmed due to current  cardiac rhythm.   ASSESSMENT: This is a very pleasant 67 years old white female with metastatic non-small cell lung cancer, adenocarcinoma with positive ALK gene translocation status post treatment with Hulda Humphrey and Alecensa (Alectinib).  She had recent disease progression including metastatic disease to the brain status post stereotactic radiotherapy. She is currently intubated for respiratory distress after cardiac arrest.   PLAN: I had a lengthy discussion with the patient and her husband at the bedside.  The patient has metastatic non-small cell lung cancer which is a terminal condition but her prognosis is consider better than other patient with the same condition because of the ALK gene translocation which can be treated with targeted oral therapy.  She was a started on third-generation treatment with Lorlatinib but only for 10 days. I strongly recommend to continue with aggressive approach  for this patient regarding her cardiac condition and respiratory failure.  She may also benefit from starting her treatment with Lorlatinib if we find a way to deliver the drug orally for her either by swallowing or through a gastric tube.  I reached out to the oral pharmacist at the cancer center to check with [place] about the best approach to deliver this drug orally to the patient.  Starting the patient on Lorlatinib may actually improve her disease and respiratory status. I discussed the case with Dr. Truman Hayward at [facility] and she is in agreement with the current plan. Thank you for taking good care of this patient, I will continue to follow-up the patient with you and assist in her management on as-needed basis. The patient voices understanding of current disease status and treatment options and is in agreement with the current care plan.  All questions were answered. The patient knows to call the clinic with any problems, questions or concerns. We can certainly see the patient much sooner if  necessary.  Thank you so much for allowing me to participate in the care of [facility]. I will continue to follow up the patient with you and assist in her care.  Disclaimer: This note was dictated with voice recognition software. Similar sounding words can inadvertently be transcribed and may not be corrected upon review.   Eilleen Kempf July 14, 2017, 10:21 AM

## 2017-07-15 LAB — GLUCOSE, CAPILLARY
GLUCOSE-CAPILLARY: 102 mg/dL — AB (ref 65–99)
GLUCOSE-CAPILLARY: 106 mg/dL — AB (ref 65–99)
GLUCOSE-CAPILLARY: 111 mg/dL — AB (ref 65–99)
GLUCOSE-CAPILLARY: 99 mg/dL (ref 65–99)
Glucose-Capillary: 107 mg/dL — ABNORMAL HIGH (ref 65–99)
Glucose-Capillary: 115 mg/dL — ABNORMAL HIGH (ref 65–99)

## 2017-07-15 LAB — BASIC METABOLIC PANEL
ANION GAP: 12 (ref 5–15)
BUN: 42 mg/dL — ABNORMAL HIGH (ref 6–20)
CALCIUM: 9.1 mg/dL (ref 8.9–10.3)
CHLORIDE: 112 mmol/L — AB (ref 101–111)
CO2: 27 mmol/L (ref 22–32)
Creatinine, Ser: 0.78 mg/dL (ref 0.44–1.00)
GFR calc Af Amer: >60 mL/min (ref >60)
GFR calc non Af Amer: >60 mL/min (ref >60)
Glucose, Bld: 130 mg/dL — ABNORMAL HIGH (ref 65–99)
Potassium: 4.5 mmol/L (ref 3.5–5.1)
SODIUM: 151 mmol/L — AB (ref 135–145)

## 2017-07-15 LAB — CBC WITH DIFFERENTIAL/PLATELET
BASOS PCT: 0 %
Basophils Absolute: 0 10*3/uL (ref 0.0–0.1)
Eosinophils Absolute: 0 10*3/uL (ref 0.0–0.7)
Eosinophils Relative: 0 %
HEMATOCRIT: 32.3 % — AB (ref 36.0–46.0)
HEMOGLOBIN: 9.4 g/dL — AB (ref 12.0–15.0)
LYMPHS ABS: 0.6 10*3/uL — AB (ref 0.7–4.0)
Lymphocytes Relative: 6 %
MCH: 29.2 pg (ref 26.0–34.0)
MCHC: 29.1 g/dL — ABNORMAL LOW (ref 30.0–36.0)
MCV: 100.3 fL — ABNORMAL HIGH (ref 78.0–100.0)
Monocytes Absolute: 0.3 10*3/uL (ref 0.1–1.0)
Monocytes Relative: 3 %
NEUTROS ABS: 10.6 10*3/uL — AB (ref 1.7–7.7)
NEUTROS PCT: 91 %
Platelets: 373 10*3/uL (ref 150–400)
RBC: 3.22 MIL/uL — ABNORMAL LOW (ref 3.87–5.11)
RDW: 17.2 % — ABNORMAL HIGH (ref 11.5–15.5)
WBC: 11.6 10*3/uL — ABNORMAL HIGH (ref 4.0–10.5)

## 2017-07-15 MED ORDER — FUROSEMIDE 10 MG/ML IJ SOLN
20.0000 mg | Freq: Once | INTRAMUSCULAR | Status: AC
  Administered 2017-07-15: 20 mg via INTRAVENOUS
  Filled 2017-07-15: qty 2

## 2017-07-15 MED ORDER — DICLOFENAC SODIUM 1 % TD GEL
2.0000 g | Freq: Four times a day (QID) | TRANSDERMAL | Status: DC | PRN
  Filled 2017-07-15: qty 100

## 2017-07-15 MED ORDER — LABETALOL HCL 5 MG/ML IV SOLN
10.0000 mg | INTRAVENOUS | Status: DC | PRN
  Administered 2017-07-15 (×2): 10 mg via INTRAVENOUS
  Filled 2017-07-15 (×3): qty 4

## 2017-07-15 MED ORDER — HYDRALAZINE HCL 20 MG/ML IJ SOLN
10.0000 mg | INTRAMUSCULAR | Status: DC | PRN
  Administered 2017-07-15 – 2017-07-16 (×4): 10 mg via INTRAVENOUS
  Filled 2017-07-15 (×4): qty 1

## 2017-07-15 NOTE — Progress Notes (Signed)
PULMONARY / CRITICAL CARE MEDICINE   Name: Jennifer Delgado MRN: [id number] DOB: 1950/11/21    ADMISSION DATE:  06/29/2017 CONSULTATION DATE: 07/03/2017  REFERRING MD: Dr. Maylene Roes, [facility] hospitalist  CHIEF COMPLAINT: Cardiac arrest  HISTORY OF PRESENT ILLNESS:   67 y/o female with stage IV NSCLC who had a cardiac arrest on 4/1 from VF in the setting of hyponatremia and pneumonia.  Is known ALK positive non-small cell lung cancer followed at [facility] with known brain metastases.  She has recently received targeted brain radiation therapy.  She presented to [place] hyponatremia.  She unfortunately offered a cardiac arrest from which she was resuscitated.  It was believed that this may be a primary cardiac origin but her mental status precluded coronary intervention.  PAST MEDICAL HISTORY :  She  has a past medical history of Brain cancer ([place]), Cancer ([place]), Cellulitis, Hypertension, Liver cancer ([place]), and Renal disorder.  PAST SURGICAL HISTORY: She  has a past surgical history that includes Thoracentesis; Bronchoscopy; and Leg Surgery.  Allergies  Allergen Reactions  . Daptomycin Other (See Comments)    Rhyabdomyolosis  . Dilaudid [Hydromorphone] Nausea And Vomiting    No current facility-administered medications on file prior to encounter.    Current Outpatient Medications on File Prior to Encounter  Medication Sig  . albuterol (PROAIR HFA) 108 (90 Base) MCG/ACT inhaler Inhale 2 puffs into the lungs every 6 (six) hours as needed for wheezing or shortness of breath.   . cetirizine (ZYRTEC) 10 MG tablet Take 10 mg by mouth at bedtime.  . diclofenac sodium (VOLTAREN) 1 % GEL Apply 2 g topically 4 (four) times daily as needed (For pain.).   Marland Kitchen furosemide (LASIX) 20 MG tablet Take 20 mg by mouth daily.   Marland Kitchen gabapentin (NEURONTIN) 300 MG capsule Take 900 mg by mouth at bedtime.  Marland Kitchen HYDROcodone-acetaminophen (NORCO) 10-325 MG tablet Take 1 tablet by mouth every 6 (six) hours as needed (For pain.).    Marland Kitchen ibuprofen (ADVIL,MOTRIN) 200 MG tablet Take 800 mg by mouth every 6 (six) hours as needed for headache.  . isosorbide mononitrate (IMDUR) 30 MG 24 hr tablet Take 30 mg by mouth daily.  Marland Kitchen lisinopril (PRINIVIL,ZESTRIL) 10 MG tablet Take 10 mg by mouth daily.  Marland Kitchen LORazepam (ATIVAN) 0.5 MG tablet Take 1 mg by mouth at bedtime.   . Lorlatinib 100 MG TABS Take 100 mg by mouth daily.  . pantoprazole (PROTONIX) 20 MG tablet Take 20 mg by mouth at bedtime.  . potassium chloride SA (K-DUR,KLOR-CON) 20 MEQ tablet Take 20 mEq by mouth daily.   . prochlorperazine (COMPAZINE) 10 MG tablet Take 10 mg by mouth every 6 (six) hours as needed for nausea or vomiting.  . sertraline (ZOLOFT) 100 MG tablet Take 200 mg by mouth daily.   Marland Kitchen triamcinolone cream (KENALOG) 0.1 % Apply 1 application topically 2 (two) times daily as needed (For rash after chemo.).     FAMILY HISTORY:  Her has no family status information on file.    SOCIAL HISTORY: She  reports that she has never smoked. She has never used smokeless tobacco. She reports that she drinks alcohol. She reports that she does not use drugs.   SUBJECTIVE:   Hemodynamically stable following cardiac arrest.  Repeat MRI shows progressive cerebral metastases and new watershed infarcts consistent with either embolic phenomena or ischemic encephalopathy.  Nevertheless she is responded well to a sedation vacation and was alert and cooperative enough yesterday to successfully be extubated.  She is  presently awake and alert.  She is minimally verbal but follows commands with good strength.  She was able to mobilize to a chair.  She denies any discomfort.  VITAL SIGNS: BP (!) 169/56   Pulse 83   Temp 98.3 F (36.8 C)   Resp 13   Ht 5' 5"  (1.651 m)   Wt 227 lb 8.2 oz (103.2 kg)   SpO2 100%   BMI 37.86 kg/m   HEMODYNAMICS:    VENTILATOR SETTINGS:    INTAKE / OUTPUT: I/O last 3 completed shifts: In: 1403.2 [I.V.:1123.6; NG/GT:279.6] Out: 1500  [Urine:1000; Stool:500]  PHYSICAL EXAMINATION: General: Obese.  Calm Neuro: No focal deficits.  Remains mildly encephalopathic. HEENT: No scleral icterus.  Trachea is midline. Cardiovascular: Hypertensive.  Heart sounds are normal. Lungs: Clear to auscultation bilaterally compliant with incentive spirometry. Abdomen: Soft and nontender.  Has been cleared all pills. Musculoskeletal: Superficial phlebitis of right upper extremity. Skin: Skin fragility.  No breakdown.  Chronic ulceration of left lower wrist extremity  LABS:  BMET Recent Labs  Lab 07/12/17 0600 07/14/17 0405 07/15/17 0436  NA 148* 149* 151*  K 3.2* 4.9 4.5  CL 103 110 112*  CO2 35* 29 27  BUN 37* 46* 42*  CREATININE 0.60 0.75 0.78  GLUCOSE 125* 145* 130*    Electrolytes Recent Labs  Lab 07/10/17 0404 07/11/17 0500 07/12/17 0600 07/14/17 0405 07/15/17 0436  CALCIUM 8.5* 8.7* 8.7* 9.0 9.1  MG 2.0 2.1  --   --   --     CBC Recent Labs  Lab 07/12/17 0600 07/14/17 0405 07/15/17 0436  WBC 6.0 7.4 11.6*  HGB 7.9* 8.4* 9.4*  HCT 26.1* 27.8* 32.3*  PLT 272 315 373    Coag's No results for input(s): APTT, INR in the last 168 hours.  Sepsis Markers No results for input(s): LATICACIDVEN, PROCALCITON, O2SATVEN in the last 168 hours.  ABG No results for input(s): PHART, PCO2ART, PO2ART in the last 168 hours.  Liver Enzymes No results for input(s): AST, ALT, ALKPHOS, BILITOT, ALBUMIN in the last 168 hours.  Cardiac Enzymes No results for input(s): TROPONINI, PROBNP in the last 168 hours.  Glucose Recent Labs  Lab 07/15/17 0016 07/15/17 0452 07/15/17 0754 07/15/17 1245 07/15/17 1656 07/15/17 2015  GLUCAP 111* 99 107* 115* 106* 102*    Imaging No results found.     SIGNIFICANT EVENTS: Extubated yesterday  LINES/TUBES: Triple-lumen catheter removed.  Left arm PICC.  DISCUSSION: Improving condition following cardiac arrest.  Remains encephalopathic and will likely require  prolonged rehabilitation.  Can resume therapy for stage IV lung cancer.  Seen by Dr. Earlie Server who concurs that she has a favorable tumor histology and is likely to have a good response to therapy.  ASSESSMENT / PLAN:  PULMONARY A: Currently tolerating extubation. P:   Continue incentive spirometry and ambulation.  CARDIOVASCULAR A:  Hypertensive. P:  We will initiate oral antihypertensive therapy.  RENAL A:   Moderate volume overload.  Resolving AK I. P:   Furosemide to mobilize third place fluids.  This will also help with blood pressure.  GASTROINTESTINAL A:   Abdomen is benign. P:   Adequate nutritional reserves.  Can wait for further speech evaluation may require resumption of enteral feeding tube.  HEMATOLOGIC A:   Stage IV lung cancer P:  Resume antineoplastic therapy  INFECTIOUS A:   No signs of infection P:   No antibiotic  ENDOCRINE A:   Currently euglycemic P:   Continue current therapy  NEUROLOGIC  A:   Mild residual encephalopathy status post cardiac arrest P: PT and OT for rehabilitation.   FAMILY  - Updates: Has been updated at bedside    Pulmonary and [place] Pager: [phone]  07/15/2017, 9:22 PM

## 2017-07-15 NOTE — Evaluation (Signed)
Clinical/Bedside Swallow Evaluation Patient Details  Name: Jennifer Delgado MRN: [id number] Date of Birth: Dec 11, 1950  Today's Date: 07/15/2017 Time: SLP Start Time (ACUTE ONLY): 60 SLP Stop Time (ACUTE ONLY): 1335 SLP Time Calculation (min) (ACUTE ONLY): 17 min  Past Medical History:  Past Medical History:  Diagnosis Date  . Brain cancer ([place])   . Cancer ([place])    lung  . Cellulitis   . Hypertension   . Liver cancer ([place])   . Renal disorder    Past Surgical History:  Past Surgical History:  Procedure Laterality Date  . BRONCHOSCOPY    . LEG SURGERY    . THORACENTESIS     HPI:  67 y.o.femalewith a hx of pulmonary edema after second chemo, with normal Echo, Metastatic lung cancer-stage IV-metatases to brain, sternum and spineulcerated lipodermatosclerosis of LLE, HTN. Admitted with nausea and vomiting and hyponatremia. V-fib arrest 07/03/17 due to STEMI. Respiratory failure due to aspiration (?) pneumonia. note intractibile nausea vomiting prior to admit. Intubated from 4/1-4/12   Assessment / Plan / Recommendation Clinical Impression  Pt demonstrates relatively good oropharyngeal function though signs of acutely impaired integrity of vocal fold closure following prolonged intubation observed. Pt is aware of PO, oral phase appears normal, swallow is timely and strong, however vocal quality is dysphonic and breath support for speech and cough on command is poor. Pt has immediate cough response following sips of thin liquids and one delayed cough following 4 oz intake of puree. Slight throat clear with nectar. Recommend pt initiate ice chips and puree if needed for meds, SLP will f/u for further trials for readiness for diet or MBS as needed, though expect gradual improvement with more time following extubation.  SLP Visit Diagnosis: Dysphagia, oropharyngeal phase (R13.12)    Aspiration Risk  Moderate aspiration risk    Diet Recommendation Ice chips PRN after oral care;NPO except meds    Medication Administration: Crushed with puree    Other  Recommendations Oral Care Recommendations: Oral care QID   Follow up Recommendations Skilled Nursing facility      Frequency and Duration min 2x/week  2 weeks       Prognosis Prognosis for Safe Diet Advancement: Good      Swallow Study   General HPI: 67 y.o.femalewith a hx of pulmonary edema after second chemo, with normal Echo, Metastatic lung cancer-stage IV-metatases to brain, sternum and spineulcerated lipodermatosclerosis of LLE, HTN. Admitted with nausea and vomiting and hyponatremia. V-fib arrest 07/03/17 due to STEMI. Respiratory failure due to aspiration (?) pneumonia. note intractibile nausea vomiting prior to admit. Intubated from 4/1-4/12 Type of Study: Bedside Swallow Evaluation Previous Swallow Assessment: none Diet Prior to this Study: NPO Temperature Spikes Noted: No Respiratory Status: Nasal cannula History of Recent Intubation: Yes Length of Intubations (days): 12 days Date extubated: 07/14/17 Behavior/Cognition: Alert;Confused;Requires cueing Oral Cavity Assessment: Within Functional Limits Oral Care Completed by SLP: No Oral Cavity - Dentition: Adequate natural dentition Self-Feeding Abilities: Total assist Patient Positioning: Upright in bed Baseline Vocal Quality: Hoarse;Breathy;Low vocal intensity Volitional Cough: Weak Volitional Swallow: Able to elicit    Oral/Motor/Sensory Function Overall Oral Motor/Sensory Function: Within functional limits   Ice Chips Ice chips: Within functional limits   Thin Liquid Thin Liquid: Impaired Presentation: Straw;Cup Pharyngeal  Phase Impairments: Cough - Immediate    Nectar Thick Nectar Thick Liquid: Impaired Presentation: Spoon;Straw Pharyngeal Phase Impairments: Throat Clearing - Immediate   Honey Thick Honey Thick Liquid: Not tested   Puree Puree: Impaired Presentation: Spoon Pharyngeal Phase Impairments:  Throat Clearing - Delayed    Solid   GO   Solid: Not tested       Herbie [place], MA CCC-SLP [phone]  Lynann Beaver 07/15/2017,1:41 PM

## 2017-07-16 LAB — CBC WITH DIFFERENTIAL/PLATELET
Basophils Absolute: 0 10*3/uL (ref 0.0–0.1)
Basophils Relative: 0 %
EOS ABS: 0 10*3/uL (ref 0.0–0.7)
Eosinophils Relative: 0 %
HCT: 30.4 % — ABNORMAL LOW (ref 36.0–46.0)
HEMOGLOBIN: 9 g/dL — AB (ref 12.0–15.0)
LYMPHS ABS: 0.6 10*3/uL — AB (ref 0.7–4.0)
Lymphocytes Relative: 5 %
MCH: 29.4 pg (ref 26.0–34.0)
MCHC: 29.6 g/dL — ABNORMAL LOW (ref 30.0–36.0)
MCV: 99.3 fL (ref 78.0–100.0)
Monocytes Absolute: 0.3 10*3/uL (ref 0.1–1.0)
Monocytes Relative: 3 %
NEUTROS PCT: 92 %
Neutro Abs: 10.9 10*3/uL — ABNORMAL HIGH (ref 1.7–7.7)
Platelets: 370 10*3/uL (ref 150–400)
RBC: 3.06 MIL/uL — AB (ref 3.87–5.11)
RDW: 17.5 % — ABNORMAL HIGH (ref 11.5–15.5)
WBC: 11.8 10*3/uL — AB (ref 4.0–10.5)

## 2017-07-16 LAB — BASIC METABOLIC PANEL
Anion gap: 11 (ref 5–15)
BUN: 46 mg/dL — ABNORMAL HIGH (ref 6–20)
CHLORIDE: 107 mmol/L (ref 101–111)
CO2: 28 mmol/L (ref 22–32)
CREATININE: 0.82 mg/dL (ref 0.44–1.00)
Calcium: 8.8 mg/dL — ABNORMAL LOW (ref 8.9–10.3)
GFR calc non Af Amer: >60 mL/min (ref >60)
Glucose, Bld: 133 mg/dL — ABNORMAL HIGH (ref 65–99)
POTASSIUM: 4.1 mmol/L (ref 3.5–5.1)
SODIUM: 146 mmol/L — AB (ref 135–145)

## 2017-07-16 LAB — GLUCOSE, CAPILLARY
GLUCOSE-CAPILLARY: 117 mg/dL — AB (ref 65–99)
GLUCOSE-CAPILLARY: 85 mg/dL (ref 65–99)
GLUCOSE-CAPILLARY: 85 mg/dL (ref 65–99)
Glucose-Capillary: 124 mg/dL — ABNORMAL HIGH (ref 65–99)
Glucose-Capillary: 115 mg/dL — ABNORMAL HIGH (ref 65–99)
Glucose-Capillary: 103 mg/dL — ABNORMAL HIGH (ref 65–99)

## 2017-07-16 MED ORDER — GABAPENTIN 250 MG/5ML PO SOLN
450.0000 mg | Freq: Every day | ORAL | Status: DC
  Administered 2017-07-16 – 2017-07-20 (×5): 450 mg via ORAL
  Filled 2017-07-16 (×5): qty 10

## 2017-07-16 MED ORDER — ACETAMINOPHEN 650 MG RE SUPP: 650.0000 mg | Freq: Four times a day (QID) | RECTAL | Status: DC | PRN

## 2017-07-16 MED ORDER — ASPIRIN 81 MG PO CHEW
81.0000 mg | CHEWABLE_TABLET | Freq: Every day | ORAL | Status: DC
  Administered 2017-07-16 – 2017-07-21 (×6): 81 mg via ORAL
  Filled 2017-07-16 (×5): qty 1

## 2017-07-16 MED ORDER — PANTOPRAZOLE SODIUM 40 MG PO PACK
40.0000 mg | PACK | ORAL | Status: DC
  Administered 2017-07-16 – 2017-07-20 (×5): 40 mg via ORAL
  Filled 2017-07-16 (×7): qty 20

## 2017-07-16 MED ORDER — ACETAMINOPHEN 325 MG PO TABS
650.0000 mg | ORAL_TABLET | Freq: Four times a day (QID) | ORAL | Status: AC
  Administered 2017-07-16 – 2017-07-18 (×8): 650 mg via ORAL
  Filled 2017-07-16 (×8): qty 2

## 2017-07-16 MED ORDER — METOPROLOL TARTRATE 25 MG/10 ML ORAL SUSPENSION
25.0000 mg | Freq: Two times a day (BID) | ORAL | Status: DC
  Administered 2017-07-16 – 2017-07-19 (×8): 25 mg via ORAL
  Filled 2017-07-16 (×5): qty 10

## 2017-07-16 MED ORDER — OXYCODONE HCL 5 MG/5ML PO SOLN
5.0000 mg | ORAL | Status: DC | PRN
  Administered 2017-07-16 – 2017-07-20 (×4): 5 mg via ORAL
  Filled 2017-07-16 (×4): qty 5

## 2017-07-16 MED ORDER — LISINOPRIL 10 MG PO TABS
10.0000 mg | ORAL_TABLET | Freq: Every day | ORAL | Status: DC
  Administered 2017-07-16 – 2017-07-17 (×2): 10 mg via ORAL
  Filled 2017-07-16 (×2): qty 1

## 2017-07-16 MED ORDER — ACETAMINOPHEN 325 MG PO TABS
650.0000 mg | ORAL_TABLET | Freq: Four times a day (QID) | ORAL | Status: DC | PRN
  Filled 2017-07-16: qty 2

## 2017-07-16 MED ORDER — ADULT MULTIVITAMIN LIQUID CH
15.0000 mL | Freq: Every day | ORAL | Status: DC
  Administered 2017-07-16 – 2017-07-18 (×3): 15 mL via ORAL
  Filled 2017-07-16 (×2): qty 15

## 2017-07-16 MED ORDER — SERTRALINE HCL 100 MG PO TABS
100.0000 mg | ORAL_TABLET | Freq: Every day | ORAL | Status: DC
  Administered 2017-07-16 – 2017-07-20 (×5): 100 mg via ORAL
  Filled 2017-07-16 (×5): qty 1

## 2017-07-16 MED ORDER — FUROSEMIDE 20 MG PO TABS
20.0000 mg | ORAL_TABLET | Freq: Every day | ORAL | Status: DC
  Administered 2017-07-16 – 2017-07-17 (×2): 20 mg via ORAL
  Filled 2017-07-16 (×2): qty 1

## 2017-07-16 MED ORDER — AMIODARONE HCL 200 MG PO TABS
400.0000 mg | ORAL_TABLET | Freq: Every day | ORAL | Status: DC
  Administered 2017-07-16 – 2017-07-21 (×6): 400 mg via ORAL
  Filled 2017-07-16 (×5): qty 2

## 2017-07-16 MED ORDER — ISOSORBIDE MONONITRATE ER 30 MG PO TB24
30.0000 mg | ORAL_TABLET | Freq: Every day | ORAL | Status: DC
  Administered 2017-07-16 – 2017-07-21 (×6): 30 mg via ORAL
  Filled 2017-07-16 (×6): qty 1

## 2017-07-16 MED ORDER — ALBUTEROL SULFATE (2.5 MG/3ML) 0.083% IN NEBU: 2.5000 mg | INHALATION_SOLUTION | Freq: Four times a day (QID) | RESPIRATORY_TRACT | Status: DC | PRN

## 2017-07-16 NOTE — Evaluation (Signed)
Physical Therapy Evaluation Patient Details Name: Jennifer Delgado MRN: [id number] DOB: 07-22-1950 Today's Date: 07/16/2017   History of Present Illness  67 y/o female with stage IV NSCLC who had a cardiac arrest on 4/1 from VF in the setting of hyponatremia and pneumonia  Clinical Impression  Pt admitted with above diagnosis. Pt currently with functional limitations due to the deficits listed below (see PT Problem List). +2 total assist for sit to stand, pt unable to achieve full upright standing position with RW and +2 assist. Performed BLE/UE strengthening exercises. Pt will likely need ST-SNF as she is deconditioned 2* more than 2 weeks of bedrest.  Pt will benefit from skilled PT to increase their independence and safety with mobility to allow discharge to the venue listed below.       Follow Up Recommendations SNF;Supervision/Assistance - 24 hour    Equipment Recommendations  None recommended by PT    Recommendations for Other Services       Precautions / Restrictions Precautions Precautions: Fall Precaution Comments: 2 falls in past 1 year Restrictions Weight Bearing Restrictions: No      Mobility  Bed Mobility               General bed mobility comments: up in recliner (nursing used maxisky)  Transfers Overall transfer level: Needs assistance Equipment used: Rolling walker (2 wheeled) Transfers: Sit to/from Stand Sit to Stand: +2 physical assistance;Total assist         General transfer comment: sit to stand with +2 total assist (pt 20%), x 2 trials, unable to achieve full upright standing position  Ambulation/Gait             General Gait Details: unable  Stairs            Wheelchair Mobility    Modified Rankin (Stroke Patients Only)       Balance Overall balance assessment: Needs assistance Sitting-balance support: Bilateral upper extremity supported;Feet supported Sitting balance-Leahy Scale: Poor   Postural control: Posterior  lean   Standing balance-Leahy Scale: Zero                               Pertinent Vitals/Pain Pain Assessment: 0-10 Pain Score: 5  Pain Location: belly Pain Intervention(s): Limited activity within patient's tolerance;Monitored during session;Patient requesting pain meds-RN notified    Home Living Family/patient expects to be discharged to:: Private residence Living Arrangements: Spouse/significant other Available Help at Discharge: Family;Available PRN/intermittently Type of Home: House Home Access: Stairs to enter   Entrance Stairs-Number of Steps: 1 Home Layout: Able to live on main level with bedroom/bathroom;Two level Home Equipment: Walker - 4 wheels      Prior Function Level of Independence: Independent with assistive device(s)         Comments: walked with RW, has been sponge bathing independently     Hand Dominance        Extremity/Trunk Assessment   Upper Extremity Assessment Upper Extremity Assessment: RUE deficits/detail;LUE deficits/detail RUE Deficits / Details: shoulder flexion 2/5 LUE Deficits / Details: shoulder flexion 2/5    Lower Extremity Assessment Lower Extremity Assessment: RLE deficits/detail;LLE deficits/detail RLE Deficits / Details: knee ext +2/5 LLE Deficits / Details: knee ext +2/5    Cervical / Trunk Assessment Cervical / Trunk Assessment: Normal  Communication   Communication: Expressive difficulties(low volume)  Cognition Arousal/Alertness: Lethargic(increased time to respond to questions) Behavior During Therapy: WFL for tasks assessed/performed Overall Cognitive Status: Impaired/Different from  baseline Area of Impairment: Problem solving                             Problem Solving: Slow processing        General Comments      Exercises General Exercises - Lower Extremity Ankle Circles/Pumps: AROM;Both;10 reps;Supine Gluteal Sets: (instructed pt in gluteal sets for independent  exercise) Heel Slides: AAROM;Both;15 reps;Supine Hip ABduction/ADduction: AAROM;10 reps;Both;Supine  Shoulder flexion AAROM; 10 reps; both; seated   Assessment/Plan    PT Assessment Patient needs continued PT services  PT Problem List Decreased strength;Decreased activity tolerance;Decreased balance;Decreased mobility       PT Treatment Interventions DME instruction;Gait training;Therapeutic activities;Functional mobility training;Therapeutic exercise;Balance training;Patient/family education    PT Goals (Current goals can be found in the Care Plan section)  Acute Rehab PT Goals Patient Stated Goal: to get strength back PT Goal Formulation: With patient/family Time For Goal Achievement: 07/30/17 Potential to Achieve Goals: Fair    Frequency Min 3X/week   Barriers to discharge        Co-evaluation               AM-PAC PT "6 Clicks" Daily Activity  Outcome Measure Difficulty turning over in bed (including adjusting bedclothes, sheets and blankets)?: Unable Difficulty moving from lying on back to sitting on the side of the bed? : Unable Difficulty sitting down on and standing up from a chair with arms (e.g., wheelchair, bedside commode, etc,.)?: Unable Help needed moving to and from a bed to chair (including a wheelchair)?: Total Help needed walking in hospital room?: Total Help needed climbing 3-5 steps with a railing? : Total 6 Click Score: 6    End of Session Equipment Utilized During Treatment: Gait belt Activity Tolerance: Patient limited by fatigue Patient left: in chair;with call bell/phone within reach;with family/visitor present Nurse Communication: Mobility status;Need for lift equipment PT Visit Diagnosis: Unsteadiness on feet (R26.81);Muscle weakness (generalized) (M62.81);Difficulty in walking, not elsewhere classified (R26.2);Pain;History of falling (Z91.81)    Time: 1220-1250 PT Time Calculation (min) (ACUTE ONLY): 30 min   Charges:   PT  Evaluation $PT Eval Moderate Complexity: 1 Mod PT Treatments $Therapeutic Activity: 8-22 mins   PT G Codes:          Philomena Doheny 07/16/2017, 1:07 PM [phone]

## 2017-07-16 NOTE — Progress Notes (Signed)
PULMONARY / CRITICAL CARE MEDICINE   Name: Jennifer Delgado MRN: [id number] DOB: 1950-10-20    ADMISSION DATE:  06/29/2017 CONSULTATION DATE: 07/03/2017  REFERRING MD: Dr. Maylene Roes, [facility] hospitalist  CHIEF COMPLAINT: Cardiac arrest  HISTORY OF PRESENT ILLNESS:   67 y/o female with stage IV NSCLC who had a cardiac arrest on 4/1 from VF in the setting of hyponatremia and pneumonia.  Is known ALK positive non-small cell lung cancer followed at [facility] with known brain metastases.  She has recently received targeted brain radiation therapy.  She presented to [place] hyponatremia.  She unfortunately offered a cardiac arrest from which she was resuscitated.  It was believed that this may be a primary cardiac origin but her mental status precluded coronary intervention.  PAST MEDICAL HISTORY :  She  has a past medical history of Brain cancer ([place]), Cancer ([place]), Cellulitis, Hypertension, Liver cancer ([place]), and Renal disorder.  PAST SURGICAL HISTORY: She  has a past surgical history that includes Thoracentesis; Bronchoscopy; and Leg Surgery.  Allergies  Allergen Reactions  . Daptomycin Other (See Comments)    Rhyabdomyolosis  . Dilaudid [Hydromorphone] Nausea And Vomiting    No current facility-administered medications on file prior to encounter.    Current Outpatient Medications on File Prior to Encounter  Medication Sig  . albuterol (PROAIR HFA) 108 (90 Base) MCG/ACT inhaler Inhale 2 puffs into the lungs every 6 (six) hours as needed for wheezing or shortness of breath.   . cetirizine (ZYRTEC) 10 MG tablet Take 10 mg by mouth at bedtime.  . diclofenac sodium (VOLTAREN) 1 % GEL Apply 2 g topically 4 (four) times daily as needed (For pain.).   Marland Kitchen furosemide (LASIX) 20 MG tablet Take 20 mg by mouth daily.   Marland Kitchen gabapentin (NEURONTIN) 300 MG capsule Take 900 mg by mouth at bedtime.  Marland Kitchen HYDROcodone-acetaminophen (NORCO) 10-325 MG tablet Take 1 tablet by mouth every 6 (six) hours as needed (For pain.).    Marland Kitchen ibuprofen (ADVIL,MOTRIN) 200 MG tablet Take 800 mg by mouth every 6 (six) hours as needed for headache.  . isosorbide mononitrate (IMDUR) 30 MG 24 hr tablet Take 30 mg by mouth daily.  Marland Kitchen lisinopril (PRINIVIL,ZESTRIL) 10 MG tablet Take 10 mg by mouth daily.  Marland Kitchen LORazepam (ATIVAN) 0.5 MG tablet Take 1 mg by mouth at bedtime.   . Lorlatinib 100 MG TABS Take 100 mg by mouth daily.  . pantoprazole (PROTONIX) 20 MG tablet Take 20 mg by mouth at bedtime.  . potassium chloride SA (K-DUR,KLOR-CON) 20 MEQ tablet Take 20 mEq by mouth daily.   . prochlorperazine (COMPAZINE) 10 MG tablet Take 10 mg by mouth every 6 (six) hours as needed for nausea or vomiting.  . sertraline (ZOLOFT) 100 MG tablet Take 200 mg by mouth daily.   Marland Kitchen triamcinolone cream (KENALOG) 0.1 % Apply 1 application topically 2 (two) times daily as needed (For rash after chemo.).     FAMILY HISTORY:  Her has no family status information on file.    SOCIAL HISTORY: She  reports that she has never smoked. She has never used smokeless tobacco. She reports that she drinks alcohol. She reports that she does not use drugs.   SUBJECTIVE:   Hemodynamically stable following cardiac arrest.  Repeat MRI shows progressive cerebral metastases and new watershed infarcts consistent with either embolic phenomena or ischemic encephalopathy.  Nevertheless she is responded well to a sedation vacation and was alert and cooperative enough yesterday to successfully be extubated.  She is  presently awake and alert.  She is minimally verbal but follows commands with good strength.  She was able to mobilize to a chair.  Reports lower abdominal discomfort  VITAL SIGNS: BP (!) 181/58 (BP Location: Right Leg)   Pulse 73   Temp 98.7 F (37.1 C) (Axillary)   Resp 18   Ht 5' 5"  (1.651 m)   Wt 230 lb 6.1 oz (104.5 kg)   SpO2 100%   BMI 38.34 kg/m   HEMODYNAMICS:    VENTILATOR SETTINGS:    INTAKE / OUTPUT: I/O last 3 completed shifts: In:  1587.5 [I.V.:1587.5] Out: 1400 [Urine:1400]  PHYSICAL EXAMINATION: General: Obese.  Calm Neuro: No focal deficits.  Remains mildly encephalopathic. HEENT: No scleral icterus.  Trachea is midline. Cardiovascular: Hypertensive.  Heart sounds are normal. Lungs: Clear to auscultation bilaterally compliant with incentive spirometry. Abdomen:cleared to swallow pills. BS present. Tender over LMWH injection sites. Musculoskeletal: Superficial phlebitis of right upper extremity. Skin: Skin fragility.  No breakdown.  Chronic ulceration of left lower wrist extremity  LABS:  BMET Recent Labs  Lab 07/14/17 0405 07/15/17 0436 07/16/17 0440  NA 149* 151* 146*  K 4.9 4.5 4.1  CL 110 112* 107  CO2 29 27 28   BUN 46* 42* 46*  CREATININE 0.75 0.78 0.82  GLUCOSE 145* 130* 133*    Electrolytes Recent Labs  Lab 07/10/17 0404 07/11/17 0500  07/14/17 0405 07/15/17 0436 07/16/17 0440  CALCIUM 8.5* 8.7*   < > 9.0 9.1 8.8*  MG 2.0 2.1  --   --   --   --    < > = values in this interval not displayed.    CBC Recent Labs  Lab 07/14/17 0405 07/15/17 0436 07/16/17 0440  WBC 7.4 11.6* 11.8*  HGB 8.4* 9.4* 9.0*  HCT 27.8* 32.3* 30.4*  PLT 315 373 370    Coag's No results for input(s): APTT, INR in the last 168 hours.  Sepsis Markers No results for input(s): LATICACIDVEN, PROCALCITON, O2SATVEN in the last 168 hours.  ABG No results for input(s): PHART, PCO2ART, PO2ART in the last 168 hours.  Liver Enzymes No results for input(s): AST, ALT, ALKPHOS, BILITOT, ALBUMIN in the last 168 hours.  Cardiac Enzymes No results for input(s): TROPONINI, PROBNP in the last 168 hours.  Glucose Recent Labs  Lab 07/15/17 1656 07/15/17 2015 07/16/17 0025 07/16/17 0342 07/16/17 0748 07/16/17 1141  GLUCAP 106* 102* 117* 124* 115* 85    Imaging No results found.   SIGNIFICANT EVENTS: Extubated yesterday  LINES/TUBES: Triple-lumen catheter removed.  Left arm  PICC.  DISCUSSION: Improving condition following cardiac arrest.  Remains encephalopathic and will likely require prolonged rehabilitation.  Can resume therapy for stage IV lung cancer.  Seen by Dr. Earlie Server who concurs that she has a favorable tumor histology and is likely to have a good response to therapy.  Tolerating extubation for >48h.  Further neurological recovery will be gradual. Ready for transfer to floor.  ASSESSMENT / PLAN:  PULMONARY A: Currently tolerating extubation. P:   Continue incentive spirometry and ambulation.  CARDIOVASCULAR A:  Hypertensive. P:  We will initiate oral home antihypertensive therapy.  RENAL A:   Moderate volume overload.  Resolving AKI. P:   Furosemide to mobilize third place fluids.  This will also help with blood pressure.  GASTROINTESTINAL A:   Abdomen is benign. P:   Adequate nutritional reserves.  Can wait for further speech evaluation may require resumption of enteral feeding tube.  HEMATOLOGIC A:  Stage IV lung cancer P:  Resume antineoplastic therapy. Dr Julien Nordmann following  INFECTIOUS A:   No signs of infection P:   No antibiotic  ENDOCRINE A:   Currently euglycemic P:   Continue current therapy  NEUROLOGIC A:   Mild residual encephalopathy status post cardiac arrest P: PT and OT for rehabilitation.   FAMILY  - Updates: Has been updated at bedside    Pulmonary and [place] Pager: [phone]  07/16/2017, 12:25 PM

## 2017-07-17 ENCOUNTER — Inpatient Hospital Stay (HOSPITAL_COMMUNITY): Payer: BLUE CROSS/BLUE SHIELD

## 2017-07-17 DIAGNOSIS — J9621 Acute and chronic respiratory failure with hypoxia: Secondary | ICD-10-CM

## 2017-07-17 DIAGNOSIS — G9340 Encephalopathy, unspecified: Secondary | ICD-10-CM

## 2017-07-17 DIAGNOSIS — Z452 Encounter for adjustment and management of vascular access device: Secondary | ICD-10-CM

## 2017-07-17 DIAGNOSIS — J189 Pneumonia, unspecified organism: Secondary | ICD-10-CM

## 2017-07-17 LAB — HEPATIC FUNCTION PANEL
ALBUMIN: 2.9 g/dL — AB (ref 3.5–5.0)
ALT: 20 U/L (ref 14–54)
AST: 18 U/L (ref 15–41)
Alkaline Phosphatase: 85 U/L (ref 38–126)
Bilirubin, Direct: 0.1 mg/dL (ref 0.1–0.5)
Indirect Bilirubin: 0.4 mg/dL (ref 0.3–0.9)
TOTAL PROTEIN: 6.6 g/dL (ref 6.5–8.1)
Total Bilirubin: 0.5 mg/dL (ref 0.3–1.2)

## 2017-07-17 LAB — CBC WITH DIFFERENTIAL/PLATELET
BASOS ABS: 0 10*3/uL (ref 0.0–0.1)
BASOS PCT: 0 %
EOS ABS: 0.3 10*3/uL (ref 0.0–0.7)
EOS PCT: 3 %
HCT: 28 % — ABNORMAL LOW (ref 36.0–46.0)
HEMOGLOBIN: 8.4 g/dL — AB (ref 12.0–15.0)
Lymphocytes Relative: 8 %
Lymphs Abs: 0.9 10*3/uL (ref 0.7–4.0)
MCH: 29.7 pg (ref 26.0–34.0)
MCHC: 30 g/dL (ref 30.0–36.0)
MCV: 98.9 fL (ref 78.0–100.0)
Monocytes Absolute: 0.6 10*3/uL (ref 0.1–1.0)
Monocytes Relative: 5 %
NEUTROS PCT: 84 %
Neutro Abs: 10.3 10*3/uL — ABNORMAL HIGH (ref 1.7–7.7)
PLATELETS: 341 10*3/uL (ref 150–400)
RBC: 2.83 MIL/uL — AB (ref 3.87–5.11)
RDW: 17.4 % — ABNORMAL HIGH (ref 11.5–15.5)
WBC: 12.2 10*3/uL — AB (ref 4.0–10.5)

## 2017-07-17 LAB — GLUCOSE, CAPILLARY
GLUCOSE-CAPILLARY: 104 mg/dL — AB (ref 65–99)
GLUCOSE-CAPILLARY: 57 mg/dL — AB (ref 65–99)
GLUCOSE-CAPILLARY: 68 mg/dL (ref 65–99)
GLUCOSE-CAPILLARY: 78 mg/dL (ref 65–99)
GLUCOSE-CAPILLARY: 89 mg/dL (ref 65–99)
GLUCOSE-CAPILLARY: 93 mg/dL (ref 65–99)
Glucose-Capillary: 102 mg/dL — ABNORMAL HIGH (ref 65–99)
Glucose-Capillary: 67 mg/dL (ref 65–99)
Glucose-Capillary: 68 mg/dL (ref 65–99)
Glucose-Capillary: 67 mg/dL (ref 65–99)

## 2017-07-17 LAB — BASIC METABOLIC PANEL
ANION GAP: 10 (ref 5–15)
BUN: 44 mg/dL — AB (ref 6–20)
CALCIUM: 8.7 mg/dL — AB (ref 8.9–10.3)
CO2: 26 mmol/L (ref 22–32)
Chloride: 106 mmol/L (ref 101–111)
Creatinine, Ser: 0.8 mg/dL (ref 0.44–1.00)
GFR calc Af Amer: >60 mL/min (ref >60)
GFR calc non Af Amer: >60 mL/min (ref >60)
GLUCOSE: 116 mg/dL — AB (ref 65–99)
POTASSIUM: 3.3 mmol/L — AB (ref 3.5–5.1)
Sodium: 142 mmol/L (ref 135–145)

## 2017-07-17 LAB — MAGNESIUM: Magnesium: 2.3 mg/dL (ref 1.7–2.4)

## 2017-07-17 MED ORDER — MAGNESIUM OXIDE 400 (241.3 MG) MG PO TABS
400.0000 mg | ORAL_TABLET | Freq: Two times a day (BID) | ORAL | Status: AC
  Administered 2017-07-17 (×2): 400 mg via ORAL
  Filled 2017-07-17 (×2): qty 1

## 2017-07-17 MED ORDER — RESOURCE THICKENUP CLEAR PO POWD
ORAL | Status: DC | PRN
  Filled 2017-07-17: qty 125

## 2017-07-17 MED ORDER — POTASSIUM CHLORIDE CRYS ER 20 MEQ PO TBCR
40.0000 meq | EXTENDED_RELEASE_TABLET | Freq: Two times a day (BID) | ORAL | Status: AC
Start: 2017-07-17 — End: 2017-07-17
  Administered 2017-07-17 (×2): 40 meq via ORAL
  Filled 2017-07-17 (×2): qty 2

## 2017-07-17 MED ORDER — FUROSEMIDE 20 MG PO TABS
20.0000 mg | ORAL_TABLET | Freq: Two times a day (BID) | ORAL | Status: DC
  Administered 2017-07-17 – 2017-07-18 (×2): 20 mg via ORAL
  Filled 2017-07-17 (×2): qty 1

## 2017-07-17 MED ORDER — LIP MEDEX EX OINT
TOPICAL_OINTMENT | CUTANEOUS | Status: AC
  Administered 2017-07-18: 01:00:00
  Filled 2017-07-17: qty 7

## 2017-07-17 MED ORDER — RESOURCE THICKENUP CLEAR PO POWD: ORAL | Status: DC | PRN

## 2017-07-17 MED ORDER — LISINOPRIL 20 MG PO TABS
20.0000 mg | ORAL_TABLET | Freq: Every day | ORAL | Status: DC
  Administered 2017-07-18 – 2017-07-21 (×4): 20 mg via ORAL
  Filled 2017-07-17 (×4): qty 1

## 2017-07-17 MED ORDER — DEXTROSE 50 % IV SOLN
INTRAVENOUS | Status: AC
  Administered 2017-07-17: 25 mL
  Filled 2017-07-17: qty 50

## 2017-07-17 MED ORDER — DEXTROSE 10 % IV SOLN
INTRAVENOUS | Status: DC
  Administered 2017-07-17: 05:00:00 via INTRAVENOUS
  Filled 2017-07-17 (×3): qty 1000

## 2017-07-17 NOTE — Progress Notes (Signed)
Hypoglycemic Event  CBG: 68  Treatment: D50 IV 25 mL  Symptoms: None  Follow-up CBG: Time:1227 CBG Result:93  Possible Reasons for Event: Inadequate meal intake  Comments/MD notified: Dr. Geraldo Docker, Larence Penning

## 2017-07-17 NOTE — Progress Notes (Signed)
Physical Therapy Treatment Patient Details Name: Jennifer Delgado MRN: [id number] DOB: 1950/09/14 Today's Date: 07/17/2017    History of Present Illness 67 y/o female with stage IV NSCLC who had a cardiac arrest on 4/1 from VF in the setting of hyponatremia and pneumonia    PT Comments    Pt in bed with son Jennifer Delgado in room visiting.  Assisted pt to sitting EOB required Total Assist to achieve then Max Assist to maintain.  Pt very weak, deconditioned and edematous throughout.  Pt was unable to support self sitting.  Pt did tolerate 7 min with support.  Unable to attempt sit to stand, used Maxi Move lift to transfer from EOB to recliner.  Positioned to comfort.  Elevated B LE and elevated B UE's.    Follow Up Recommendations   SNF     Equipment Recommendations       Recommendations for Other Services       Precautions / Restrictions Precautions Precautions: Fall Restrictions Weight Bearing Restrictions: No    Mobility  Bed Mobility Overal bed mobility: Needs Assistance Bed Mobility: Supine to Sit     Supine to sit: Total assist;+2 for physical assistance;+2 for safety/equipment     General bed mobility comments: static sitting EOB required Max Assist due to weakness with posterior lean.  Tolerated 7 min.    Transfers Overall transfer level: Needs assistance               General transfer comment: due to low performance with bed mobility and sitting ability unable to attempt sit to stand  Ambulation/Gait             General Gait Details: unable at this time   Stairs             Wheelchair Mobility    Modified Rankin (Stroke Patients Only)       Balance                                            Cognition Arousal/Alertness: Awake/alert(slow/groggy) Behavior During Therapy: Flat affect Overall Cognitive Status: Impaired/Different from baseline Area of Impairment: Safety/judgement;Awareness;Problem solving                          Safety/Judgement: Decreased awareness of safety   Problem Solving: Slow processing        Exercises      General Comments        Pertinent Vitals/Pain Pain Assessment: No/denies pain    Home Living                      Prior Function            PT Goals (current goals can now be found in the care plan section) Progress towards PT goals: Progressing toward goals    Frequency           PT Plan      Co-evaluation              AM-PAC PT "6 Clicks" Daily Activity  Outcome Measure                   End of Session               Time: 5053-9767 PT Time Calculation (min) (ACUTE ONLY): 35 min  Charges:  $  Therapeutic Activity: 23-37 mins                    G Codes:       Jennifer Delgado  PTA [facility]  Acute  Rehab Pager      [phone]

## 2017-07-17 NOTE — Progress Notes (Signed)
eLink Physician-Brief Progress Note Patient Name: Jennifer Delgado DOB: 04/15/1950 MRN: [id number]   Date of Service  07/17/2017  HPI/Events of Note  rn calls for hypoglycemia 68 but astymptomatic and is on d5w at at 50cc/h. No insulin given per RN  eICU Interventions  Increase d5w to 75cc/h     Intervention Category Intermediate Interventions: Other:  Brand Males 07/17/2017, 12:35 AM

## 2017-07-17 NOTE — Progress Notes (Signed)
Modified Barium Swallow Progress Note  Patient Details  Name: Jennifer Delgado MRN: [id number] Date of Birth: December 22, 1950  Today's Date: 07/17/2017  Modified Barium Swallow completed.  Full report located under Chart Review in the Imaging Section.  Brief recommendations include the following:  Clinical Impression  Pt presents with moderately severe oropharyngeal dysphagia with resultant aspiration of thin, nectar liquids due to decreased laryngeal closure and poor timing of swallow.  Chin tuck with tsps nectar prevented aspiration.  Vallecular residuals present due to decreased tongue base retraction/oral propulsion.  Pt with inconsistent awareness to vallecular residuals.  Reflexive delayed cough with aspiration was not effective to clear airway.   Barium tablet given with puree readily transited oropharynx to distal esophagus.    Recommend consider full liquid *nectar* diet with STRICT precautions (tsps with chin tuck) and full supervision.  If pt is coughing with po = she is ASPIRATING.  She will remain a risk due to her deconditioning and sensory deficits.  Hopeful for ongoing improvement as pt medically improves.    Recommended pt strengthen her cough/expectoration ability using teach back.  Dependent on her tolerance, she may benefit from repeat MBS.  Of note, pt asking if she can take her chemo pill today - advised her to speak to her MD.    Ricka Burdock Evaluation Recommendations       SLP Diet Recommendations: Other (Comment);Nectar thick liquid(full liquids, nectar)       Medication Administration: Crushed with puree(whole if small - start and follow with tsps nectar)   Supervision: Patient able to self feed   Compensations: Minimize environmental distractions;Small sips/bites;Slow rate;Effortful swallow;Chin tuck;Multiple dry swallows after each bite/sip   Postural Changes: Remain semi-upright after after feeds/meals (Comment);Seated upright at 90 degrees   Oral Care Recommendations:  Other (Comment);Oral care BID   Other Recommendations: Have oral suction available;Order thickener from pharmacy;Clarify dietary restrictions    Macario Golds 07/17/2017,1:24 PM Luanna Salk, [place] [facility] SLP [phone]

## 2017-07-17 NOTE — Progress Notes (Signed)
CRITICAL VALUE ALERT  Critical Value:  Blood Glucose 68  Date & Time Notied: 07/17/17;1220  Provider Notified: Margaree Mackintosh provider YES  Orders Received/Actions taken: awaiting new orders.

## 2017-07-17 NOTE — Care Management Important Message (Signed)
Important Message  Patient Details  Name: Jennifer Delgado MRN: [id number] Date of Birth: 1950-04-28   Medicare Important Message Given:  Yes    [name], [name] 07/17/2017, 12:30 [place] Message  Patient Details  Name: Jennifer Delgado MRN: [id number] Date of Birth: 1950-11-25   Medicare Important Message Given:  Yes    [name], [name] 07/17/2017, 12:28 PM

## 2017-07-17 NOTE — Telephone Encounter (Signed)
Oral Oncology Pharmacist Encounter  Received information from [place] with directions on making a suspension for administration through a nasogastric tube of patient's [place]. It appears [place] can be dispersed and water and administered through NG tube, if needed. Information has been shared with inpatient pharmacist.  Patient now extubated, still with moderately severe oropharyngeal dysphasia per notes today. Unsure if patient is able to swallow medications at this time.  Discussed with MD. Dr. Julien Nordmann informed that in order for patient to receive her [place] inpatient, he would need to place an order, and patient would have to bring in home supply to be dispensed through the inpatient pharmacy per [facility] and procedures.  Johny Drilling, PharmD, BCPS, BCOP 07/17/2017 4:16 PM Oral Oncology Clinic [phone]

## 2017-07-17 NOTE — Progress Notes (Signed)
Hypoglycemic Event  CBG: 57  Treatment: 4 OZ OJ x2 Symptoms: Nonsymptomatic  Follow-up CBG: Time: 2040 CBG Result:67 Follow-up CBG: Time: 2141   CBG Result:104  Possible Reasons for Event: Decrease oral intake.   Comments/MD notified: Patient ate 2 cups of chocolate icecream, is on q4 hour CBG checks. Next will be taken at 0000

## 2017-07-17 NOTE — Progress Notes (Addendum)
Patient asked about starting her chemo medication, Per Speech recommendation its crush pills in puree and small pills,  Dr. Venetia Constable notified and stated to hold off since the chemo pill cannot to broken/crushed will hold off for now and will continue to reassess. Patient/son updated.

## 2017-07-17 NOTE — Evaluation (Signed)
Occupational Therapy Evaluation Patient Details Name: Jennifer Delgado MRN: [id number] DOB: 06-Feb-1951 Today's Date: 07/17/2017    History of Present Illness 67 y/o female with stage IV NSCLC who had a cardiac arrest on 4/1 from VF in the setting of hyponatremia and pneumonia   Clinical Impression   Pt admitted with hypontremia. Pt currently with functional limitations due to the deficits listed below (see OT Problem List).  Pt will benefit from skilled OT to increase their safety and independence with ADL and functional mobility for ADL to facilitate discharge to venue listed below.      Follow Up Recommendations  SNF    Equipment Recommendations  None recommended by OT       Precautions / Restrictions Precautions Precautions: Fall Precaution Comments: 2 falls in past 1 year Restrictions Weight Bearing Restrictions: No      Mobility Bed Mobility Overal bed mobility: Needs Assistance Bed Mobility: Rolling Rolling: Total assist            Transfers                 General transfer comment: did not perform- would need lift        ADL either performed or assessed with clinical judgement   ADL Overall ADL's : Needs assistance/impaired Eating/Feeding: Maximal assistance;Bed level   Grooming: Wash/dry face;Maximal assistance;Bed level                                 General ADL Comments: edema in R hand limiting ADL activity.  Pt is R handed.  Pt would like to feed self. Will try some AE for self feeding next OT sessiion     Vision Patient Visual Report: No change from baseline              Pertinent Vitals/Pain Faces Pain Scale: No hurt     Hand Dominance     Extremity/Trunk Assessment Upper Extremity Assessment RUE Deficits / Details: shoulder flexion 2/5.  Pts RUE  with significant edema.  Educated pt and spoke with RN about elevating RUE on pillows to decrease edema LUE Deficits / Details: shoulder flexion 2/5   Lower Extremity  Assessment RLE Deficits / Details: knee ext +2/5 LLE Deficits / Details: knee ext +2/5   Cervical / Trunk Assessment Cervical / Trunk Assessment: Normal   Communication Communication Communication: Expressive difficulties(low volume)   Cognition   Behavior During Therapy: WFL for tasks assessed/performed Overall Cognitive Status: No family/caregiver present to determine baseline cognitive functioning Area of Impairment: Problem solving                             Problem Solving: Slow processing     General Comments       Exercises General Exercises - Upper Extremity Shoulder Flexion: AAROM;Both;10 reps;Supine Elbow Flexion: Both;AAROM;Supine Elbow Extension: AAROM;10 reps;Supine Wrist Flexion: AROM;10 reps;Both;Supine Wrist Extension: AROM;Both;Supine Digit Composite Flexion: AROM;10 reps;Both;Supine Composite Extension: AROM;10 reps;Both   Shoulder Instructions      Home Living Family/patient expects to be discharged to:: Private residence Living Arrangements: Spouse/significant other Available Help at Discharge: Family;Available PRN/intermittently Type of Home: House Home Access: Stairs to enter Entrance Stairs-Number of Steps: 1   Home Layout: Able to live on main level with bedroom/bathroom;Two level     Bathroom Shower/Tub: Teacher, early years/pre: Standard     Home Equipment: Environmental consultant -  4 wheels          Prior Functioning/Environment Level of Independence: Independent with assistive device(s)        Comments: walked with RW, has been sponge bathing independently        OT Problem List: Decreased strength;Impaired balance (sitting and/or standing);Decreased range of motion;Decreased safety awareness;Decreased activity tolerance;Impaired UE functional use;Decreased cognition      OT Treatment/Interventions: Self-care/ADL training;DME and/or AE instruction;Therapeutic activities;Therapeutic exercise;Patient/family education     OT Goals(Current goals can be found in the care plan section) Acute Rehab OT Goals Patient Stated Goal: to get strength back OT Goal Formulation: With patient Time For Goal Achievement: 07/31/17  OT Frequency: Min 2X/week   Barriers to D/C:               AM-PAC PT "6 Clicks" Daily Activity     Outcome Measure Help from another person eating meals?: A Lot Help from another person taking care of personal grooming?: A Lot Help from another person toileting, which includes using toliet, bedpan, or urinal?: Total Help from another person bathing (including washing, rinsing, drying)?: Total Help from another person to put on and taking off regular upper body clothing?: Total Help from another person to put on and taking off regular lower body clothing?: Total 6 Click Score: 8   End of Session Nurse Communication: Other (comment)(keeping RUE elevated to decrease edema)  Activity Tolerance: Patient limited by fatigue Patient left: in bed;with call bell/phone within reach;with nursing/sitter in room  OT Visit Diagnosis: Muscle weakness (generalized) (M62.81);Repeated falls (R29.6);History of falling (Z91.81);Unsteadiness on feet (R26.81);Other abnormalities of gait and mobility (R26.89)                Time: 6153-7943 OT Time Calculation (min): 16 min Charges:  OT General Charges $OT Visit: 1 Visit OT Evaluation $OT Eval Moderate Complexity: 1 Mod G-Codes:     Kari Baars, [place]  Payton Mccallum D 07/17/2017, 11:39 AM

## 2017-07-17 NOTE — Progress Notes (Signed)
[facility] PROGRESS NOTE    Progress Note  Jennifer Delgado  PJA:[id number] DOB: 1950/05/25 DOA: 06/29/2017 PCP: Patient, No Pcp Per     Brief Narrative:   Jennifer Delgado is an 67 y.o. female past medical history of non-small cell stage IV carcinoma who presents with cardiac arrest on 4 1 for V. fib in the setting of hyponatremia and pneumonia  Assessment/Plan:   Acute on chronic respiratory failure with hypoxia ([place]) setting of HCAP and possibly COPD exacerbation with heart failure: In the setting of V. fib arrest status post extubation and resuscitated.Marland Kitchen Extubated on 07/16/2017 She completed her antibiotic regimen in house. Therapy evaluated the patient the recommended nursing facility.  Acute encephalopathy: Likely due to hyponatremia and infectious etiology now resolved.  V. fib arrest: Continue metoprolol and amiodarone Etiology deemed her not a candidate for AICD or invasive procedure we will pursue a aggressive medical management.  Hyponatremia: Now resolved.  Acute kidney injury/with moderate fluid overload: He was started on IV diuresis with moderate response.  Essential hypertension: Continue Lasix will increase lisinopril.  Normocytic anemia:  Non-small cell lung cancer with metastasis ([place]) Oncology was consulted,started on third-generation treatment with Lorlatinib but only for 10 days. Awaiting a swallowing evaluation results pending  Depression Continue Zoloft.  Ulcerated lipoma dermatosclerosis of the left lower extremity: Continue wound care  DVT prophylaxis: lovenox Family Communication:none Disposition Plan/Barrier to D/C: home in 2 days Code Status:     Code Status Orders  (From admission, onward)        Start     Ordered   07/03/17 1024  Do not attempt resuscitation (DNR)  Continuous    Question Answer Comment  In the event of cardiac or respiratory ARREST Do not call a "code blue"   In the event of cardiac or respiratory ARREST Do  not perform Intubation, CPR, defibrillation or ACLS   In the event of cardiac or respiratory ARREST Use medication by any route, position, wound care, and other measures to relive pain and suffering. May use oxygen, suction and manual treatment of airway obstruction as needed for comfort.      07/03/17 1024    Code Status History    Date Active Date Inactive Code Status Order ID Comments User Context   07/03/2017 0633 07/03/2017 1024 DNR [id number]  Kandice Hams, MD Inpatient   07/03/2017 0503 07/03/2017 0633 DNR [id number]  Kandice Hams, MD Inpatient   07/03/2017 0238 07/03/2017 0503 Full Code [id number]  Omar Person, NP Inpatient   06/30/2017 0507 07/03/2017 0238 Full Code [id number]  Norval Morton, MD Inpatient        IV Access:    Peripheral IV   Procedures and diagnostic studies:   No results found.   Medical Consultants:    None.  Anti-Infectives:   none  Subjective:    Jennifer Delgado has no new complaints lying comfortably in bed.  Objective:    Vitals:   07/16/17 2030 07/17/17 0440 07/17/17 0500 07/17/17 0759  BP:  (!) 149/60    Pulse:  64    Resp:  16    Temp:  97.7 F (36.5 C)    TempSrc:  Oral    SpO2: 99% 97%  96%  Weight:   103.9 kg (229 lb 0.9 oz)   Height:        Intake/Output Summary (Last 24 hours) at 07/17/2017 0804 Last data filed at 07/17/2017 0504 Gross per 24 hour  Intake 1181.67 ml  Output 175  ml  Net 1006.67 ml   Filed Weights   07/15/17 0455 07/16/17 0445 07/17/17 0500  Weight: 103.2 kg (227 lb 8.2 oz) 104.5 kg (230 lb 6.1 oz) 103.9 kg (229 lb 0.9 oz)    Exam: General exam: In no acute distress. Respiratory system: Good air movement and clear to auscultation. Cardiovascular system: S1 & S2 heard, RRR.  Gastrointestinal system: Abdomen is nondistended, soft and nontender.  Central nervous system: Alert and oriented. No focal neurological deficits. Extremities: No pedal edema. Skin: No rashes, lesions or  ulcers Psychiatry: Appears with a depressed mood with poor insight on her condition.   Data Reviewed:    Labs: Basic Metabolic Panel: Recent Labs  Lab 07/11/17 0500 07/12/17 0600 07/14/17 0405 07/15/17 0436 07/16/17 0440  NA 147* 148* 149* 151* 146*  K 3.7 3.2* 4.9 4.5 4.1  CL 104 103 110 112* 107  CO2 34* 35* 29 27 28   GLUCOSE 133* 125* 145* 130* 133*  BUN 42* 37* 46* 42* 46*  CREATININE 0.68 0.60 0.75 0.78 0.82  CALCIUM 8.7* 8.7* 9.0 9.1 8.8*  MG 2.1  --   --   --   --    GFR Estimated Creatinine Clearance: 81.8 mL/min (by C-G formula based on SCr of 0.82 mg/dL). Liver Function Tests: Recent Labs  Lab 07/17/17 0409  AST 18  ALT 20  ALKPHOS 85  BILITOT 0.5  PROT 6.6  ALBUMIN 2.9*   No results for input(s): LIPASE, AMYLASE in the last 168 hours. No results for input(s): AMMONIA in the last 168 hours. Coagulation profile No results for input(s): INR, PROTIME in the last 168 hours.  CBC: Recent Labs  Lab 07/12/17 0600 07/14/17 0405 07/15/17 0436 07/16/17 0440 07/17/17 0409  WBC 6.0 7.4 11.6* 11.8* 12.2*  NEUTROABS 4.5 6.6 10.6* 10.9* 10.3*  HGB 7.9* 8.4* 9.4* 9.0* 8.4*  HCT 26.1* 27.8* 32.3* 30.4* 28.0*  MCV 97.8 99.3 100.3* 99.3 98.9  PLT 272 315 373 370 341   Cardiac Enzymes: No results for input(s): CKTOTAL, CKMB, CKMBINDEX, TROPONINI in the last 168 hours. BNP (last 3 results) No results for input(s): PROBNP in the last 8760 hours. CBG: Recent Labs  Lab 07/16/17 2015 07/16/17 2359 07/17/17 0118 07/17/17 0435 07/17/17 0728  GLUCAP 85 68 102* 67 78   D-Dimer: No results for input(s): DDIMER in the last 72 hours. Hgb A1c: No results for input(s): HGBA1C in the last 72 hours. Lipid Profile: No results for input(s): CHOL, HDL, LDLCALC, TRIG, CHOLHDL, LDLDIRECT in the last 72 hours. Thyroid function studies: No results for input(s): TSH, T4TOTAL, T3FREE, THYROIDAB in the last 72 hours.  Invalid input(s): FREET3 Anemia work up: No results  for input(s): VITAMINB12, FOLATE, FERRITIN, TIBC, IRON, RETICCTPCT in the last 72 hours. Sepsis Labs: Recent Labs  Lab 07/14/17 0405 07/15/17 0436 07/16/17 0440 07/17/17 0409  WBC 7.4 11.6* 11.8* 12.2*   Microbiology No results found for this or any previous visit (from the past 240 hour(s)).   Medications:   . acetaminophen  650 mg Oral Q6H  . amiodarone  400 mg Oral Daily  . arformoterol  15 mcg Nebulization BID  . aspirin  81 mg Oral Daily  . atorvastatin  40 mg Oral q1800  . chlorhexidine  15 mL Mouth Rinse BID  . Chlorhexidine Gluconate Cloth  6 each Topical Daily  . enoxaparin (LOVENOX) injection  40 mg Subcutaneous Q24H  . furosemide  20 mg Oral Daily  . gabapentin  450 mg Oral QHS  .  insulin aspart  3-9 Units Subcutaneous Q4H  . isosorbide mononitrate  30 mg Oral Daily  . ketorolac  15 mg Intravenous Q8H  . lidocaine  1 patch Transdermal Q24H  . lisinopril  10 mg Oral Daily  . mouth rinse  15 mL Mouth Rinse q12n4p  . metoprolol tartrate  25 mg Oral BID  . multivitamin  15 mL Oral Daily  . pantoprazole sodium  40 mg Oral Q24H  . sertraline  100 mg Oral QHS   Continuous Infusions: . dextrose 75 mL/hr at 07/17/17 0504      LOS: 17 days   Charlynne Cousins  [facility] Pager [phone]  *Please refer to [email or web address], password [email or web address] to get updated schedule on who will round on this patient, as hospitalists switch teams weekly. If 7PM-7AM, please contact night-coverage at [email or web address], password [email or web address] for any overnight needs.  07/17/2017, 8:04 AM

## 2017-07-17 NOTE — Progress Notes (Signed)
eLink Physician-Brief Progress Note Patient Name: Jennifer Delgado DOB: 04/17/1950 MRN: [id number]   Date of Service  07/17/2017  HPI/Events of Note  Hypoglycemia despite increase in d5W - sugar 67mg % per RN  eICU Interventions  Change to D10W at 75cc/h     Intervention Category Intermediate Interventions: Other:  Jennifer Delgado 07/17/2017, 4:51 AM

## 2017-07-17 NOTE — Progress Notes (Signed)
Nutrition Follow-up  DOCUMENTATION CODES:   Obesity unspecified  INTERVENTION:  - If diet unable to be advanced and restart of TF warranted, recommend small bore NGT placement. - If TF restarted, recommend Osmolite 1.5 @ 55 mL/hr which will provide 2280 kcal, 128 grams of protein, and 1006 mL free water. - RD will continue to monitor for nutrition-related interventions.   NUTRITION DIAGNOSIS:   Inadequate oral intake related to inability to eat as evidenced by NPO status. -ongoing  GOAL:   Patient will meet greater than or equal to 90% of their needs -unmet/unable at this time  MONITOR:   Diet advancement, Weight trends, Labs  ASSESSMENT:   67 yr old female with PMHx of NSCLS with mets to the brain, spine and liver s/p Gamma knife at [facility] on 3/26 and 3/27 presented to [facility] with nausea and vomiting accompanied by HA and SOB. Admitted to Hospitalist service for hypovolemic hyponatremia.  Weight has been mainly stable and consistent with admission weight since 4/10. Pt was extubated on 4/12 at ~12:00 PM. OGT was removed at that time. Pt was seen by SLP on 4/13 who reported pt with moderate aspiration risk and recommendation for MBS. Note states "expect gradual improvement with more time following extubation." TF recommendations outlined above should they be warranted. Estimated nutrition needs updated this AM d/t extubation.   Per Dr. Michelle Piper note yesterday afternoon: AKI resolved and pt with moderate volume overload, may require restart of TF, plan to restart antineoplastic therapy (pt receiving oncology care at [facility] prior to admission, mild residual encephalopathy.   Medications reviewed; 20 mg oral Lasix/day, sliding scale Novolog, 15 mL oral multivitamin/day, 40 mg oral Protonix/day. Labs reviewed; CBGS : 102, 67, and 78 mg/dL today, Na: 146 mmol/L, BUN: 46 mg/dL, Ca: 8.8 mg/dL.  IVF: D10 @ 75 mL/hr (612 kcal).     Diet Order:  Diet NPO time specified Except for: Ice  Chips  EDUCATION NEEDS:   No education needs have been identified at this time  Skin:  Skin Assessment: Skin Integrity Issues: Skin Integrity Issues:: DTI DTI: lip (4/5) Other: non-pressure injury to L leg  Last BM:  4/14  Height:   Ht Readings from Last 1 Encounters:  06/30/17 5\' 5"  (1.651 m)    Weight:   Wt Readings from Last 1 Encounters:  07/17/17 229 lb 0.9 oz (103.9 kg)    Ideal Body Weight:  56.82 kg  BMI:  Body mass index is 38.12 kg/m.  Estimated Nutritional Needs:   Kcal:  2200-2400  Protein:  125-135 grams (1.2-1.3 grams/kg)  Fluid:  >/= 2 L/day      Jarome Matin, MS, RD, LDN, [facility] Inpatient Clinical Dietitian Pager # [phone] After hours/weekend pager # [phone]

## 2017-07-17 NOTE — Progress Notes (Addendum)
  Speech Language Pathology Treatment: Dysphagia  Patient Details Name: Jennifer Delgado MRN: [id number] DOB: 22-Feb-1951 Today's Date: 07/17/2017 Time: 3491-7915 SLP Time Calculation (min) (ACUTE ONLY): 13 min  Assessment / Plan / Recommendation Clinical Impression  Pt reports improved swallowing over this weekend c/b decreased coughing with intake. She continues to be dysphonic = which she states has not significantly improved.   Pt seen for therapeutic feeds of puree/nectar/ice chips - Multiple swallows with puree noted with delayed subtle cough concerning for pharyngeal residuals and aspiration.  Palatal elevation with stimulation not observed - concerning for vagus nerve involvement.  Will plan for MBS today to determine if any po may be tolerated.  Pt denies h/o oxygen use at home, dysphagia or dysphonia prior to admit- but note she has her 2nd chemo recently. Pt agreeable to plan and MD, RN informed.   Encouraged pt to continue ice chips and incentive spirometer.   HPI HPI: 67 y.o.femalewith a hx of pulmonary edema after second chemo, with normal Echo, Metastatic lung cancer-stage IV-metatases to brain, sternum and spineulcerated lipodermatosclerosis of LLE, HTN. Admitted with nausea and vomiting and hyponatremia. V-fib arrest 07/03/17 due to STEMI. Respiratory failure due to aspiration (?) pneumonia. note intractibile nausea vomiting prior to admit. Intubated from 4/1-4/12      SLP Plan  Continue with current plan of care;MBS(mbs today)       Recommendations  Diet recommendations: NPO(ice, medicine - crushed-with puree) Liquids provided via: Teaspoon(ice) Medication Administration: Crushed with puree                Oral Care Recommendations: Oral care BID Plan: Continue with current plan of care;MBS(mbs today)       GO                Macario Golds 07/17/2017, 10:02 AM  Luanna Salk, [place] [facility] SLP [phone]

## 2017-07-17 NOTE — Progress Notes (Signed)
CRITICAL VALUE ALERT  Critical Value:  Blood Glucose 67  Date & Time Notied:07/17/17;0444  Provider Notified:Yes @ E-Link  Orders Received/Actions taken: Awaiting orders

## 2017-07-18 LAB — CBC WITH DIFFERENTIAL/PLATELET
BASOS ABS: 0 10*3/uL (ref 0.0–0.1)
Basophils Relative: 0 %
EOS PCT: 4 %
Eosinophils Absolute: 0.4 10*3/uL (ref 0.0–0.7)
HEMATOCRIT: 29 % — AB (ref 36.0–46.0)
HEMOGLOBIN: 8.8 g/dL — AB (ref 12.0–15.0)
LYMPHS ABS: 0.5 10*3/uL — AB (ref 0.7–4.0)
LYMPHS PCT: 5 %
MCH: 29.6 pg (ref 26.0–34.0)
MCHC: 30.3 g/dL (ref 30.0–36.0)
MCV: 97.6 fL (ref 78.0–100.0)
Monocytes Absolute: 0.6 10*3/uL (ref 0.1–1.0)
Monocytes Relative: 6 %
NEUTROS ABS: 8.6 10*3/uL — AB (ref 1.7–7.7)
NEUTROS PCT: 85 %
Platelets: 308 10*3/uL (ref 150–400)
RBC: 2.97 MIL/uL — ABNORMAL LOW (ref 3.87–5.11)
RDW: 17.3 % — ABNORMAL HIGH (ref 11.5–15.5)
WBC: 10 10*3/uL (ref 4.0–10.5)

## 2017-07-18 LAB — BASIC METABOLIC PANEL
ANION GAP: 14 (ref 5–15)
BUN: 39 mg/dL — AB (ref 6–20)
CHLORIDE: 105 mmol/L (ref 101–111)
CO2: 26 mmol/L (ref 22–32)
Calcium: 8.9 mg/dL (ref 8.9–10.3)
Creatinine, Ser: 0.93 mg/dL (ref 0.44–1.00)
GFR calc Af Amer: >60 mL/min (ref >60)
GFR calc non Af Amer: >60 mL/min (ref >60)
GLUCOSE: 105 mg/dL — AB (ref 65–99)
Potassium: 3.4 mmol/L — ABNORMAL LOW (ref 3.5–5.1)
Sodium: 145 mmol/L (ref 135–145)

## 2017-07-18 LAB — GLUCOSE, CAPILLARY
GLUCOSE-CAPILLARY: 78 mg/dL (ref 65–99)
Glucose-Capillary: 60 mg/dL — ABNORMAL LOW (ref 65–99)
Glucose-Capillary: 74 mg/dL (ref 65–99)
Glucose-Capillary: 82 mg/dL (ref 65–99)
Glucose-Capillary: 85 mg/dL (ref 65–99)
Glucose-Capillary: 89 mg/dL (ref 65–99)
Glucose-Capillary: 94 mg/dL (ref 65–99)

## 2017-07-18 MED ORDER — FUROSEMIDE 20 MG PO TABS
20.0000 mg | ORAL_TABLET | Freq: Every day | ORAL | Status: DC
  Administered 2017-07-19 – 2017-07-21 (×3): 20 mg via ORAL
  Filled 2017-07-18 (×3): qty 1

## 2017-07-18 MED ORDER — DEXTROSE 50 % IV SOLN
INTRAVENOUS | Status: AC
  Administered 2017-07-18: 25 mL
  Filled 2017-07-18: qty 50

## 2017-07-18 MED ORDER — PROMETHAZINE HCL 25 MG PO TABS: 25.0000 mg | ORAL_TABLET | Freq: Four times a day (QID) | ORAL | Status: DC | PRN

## 2017-07-18 MED ORDER — POTASSIUM CHLORIDE 10 MEQ/100ML IV SOLN
10.0000 meq | INTRAVENOUS | Status: AC
  Administered 2017-07-18 (×5): 10 meq via INTRAVENOUS
  Filled 2017-07-18 (×5): qty 100

## 2017-07-18 MED ORDER — PROMETHAZINE HCL 25 MG/ML IJ SOLN: 12.5000 mg | Freq: Four times a day (QID) | INTRAMUSCULAR | Status: DC | PRN

## 2017-07-18 MED ORDER — PROMETHAZINE HCL 25 MG RE SUPP
25.0000 mg | Freq: Four times a day (QID) | RECTAL | Status: DC | PRN
Start: 2017-07-18 — End: 2017-07-22

## 2017-07-18 NOTE — Consult Note (Signed)
[place] Nurse wound follow up Wound type: DTI oral mucosa Measurement:resolved  Area has resolved, tiny discoloration noted on the inner aspect of the right side of the bottom lip.  No other care needed.  Patient extubated and recovering.   Re consult if needed, will not follow at this time. Thanks  Arlinda Barcelona [employer], RN,CWOCN, CNS, [place] [phone])

## 2017-07-18 NOTE — Progress Notes (Signed)
Patient wishes to be FULL CODE. PCP on call was notified. Spouse at bedside

## 2017-07-18 NOTE — Progress Notes (Signed)
Occupational Therapy Treatment Patient Details Name: Jennifer Delgado MRN: [id number] DOB: 20-Oct-1950 Today's Date: 07/18/2017    History of present illness 67 y/o female with stage IV NSCLC who had a cardiac arrest on 4/1 from VF in the setting of hyponatremia and pneumonia   OT comments  Focused on BUE ROM and self feeding ( drinking)  Follow Up Recommendations  SNF    Equipment Recommendations  None recommended by OT    Recommendations for Other Services      Precautions / Restrictions Precautions Precautions: Fall Precaution Comments: 2 falls in past 1 year Restrictions Weight Bearing Restrictions: No              ADL either performed or assessed with clinical judgement   ADL Overall ADL's : Needs assistance/impaired Eating/Feeding: Moderate assistance;Bed level   Grooming: Moderate assistance;Bed level                                 General ADL Comments: pt with decreased edema in R UE this day. Pt and RN reported keeping RUE elevated all day yesterday.  Pt able to hold cup and bring to mouth with mod A., Pt feeling nauseous this day- will attempt using spoon next session     Vision Patient Visual Report: No change from baseline            Cognition Arousal/Alertness: Awake/alert Behavior During Therapy: WFL for tasks assessed/performed Overall Cognitive Status: No family/caregiver present to determine baseline cognitive functioning                                          Exercises General Exercises - Upper Extremity Shoulder Flexion: AAROM;Both;10 reps;Supine Elbow Flexion: Both;AAROM;Supine Elbow Extension: AAROM;10 reps;Supine Wrist Flexion: AROM;10 reps;Both;Supine Wrist Extension: AROM;Both;Supine Digit Composite Flexion: AROM;10 reps;Both;Supine Composite Extension: AROM;10 reps;Both   Shoulder Instructions            Pertinent Vitals/ Pain       Pain Assessment: No/denies pain         Frequency  Min  2X/week        Progress Toward Goals  OT Goals(current goals can now be found in the care plan section)  Progress towards OT goals: Progressing toward goals            AM-PAC PT "6 Clicks" Daily Activity     Outcome Measure   Help from another person eating meals?: A Lot Help from another person taking care of personal grooming?: A Lot Help from another person toileting, which includes using toliet, bedpan, or urinal?: Total Help from another person bathing (including washing, rinsing, drying)?: Total Help from another person to put on and taking off regular upper body clothing?: Total Help from another person to put on and taking off regular lower body clothing?: Total 6 Click Score: 8    End of Session    OT Visit Diagnosis: Muscle weakness (generalized) (M62.81);Repeated falls (R29.6);History of falling (Z91.81);Unsteadiness on feet (R26.81);Other abnormalities of gait and mobility (R26.89)   Activity Tolerance Patient limited by fatigue   Patient Left in bed;with call bell/phone within reach;with nursing/sitter in room   Nurse Communication Other (comment)(keeping RUE elevated to decrease edema)        Time: 1025-8527 OT Time Calculation (min): 16 min  Charges: OT General Charges $OT Visit: 1 Visit  OT Treatments $Self Care/Home Management : 8-22 mins  [place], [place]   Betsy Pries 07/18/2017, 10:33 AM

## 2017-07-18 NOTE — Progress Notes (Signed)
  Speech Language Pathology Treatment: Dysphagia  Patient Details Name: Jennifer Delgado MRN: [id number] DOB: 1950-11-15 Today's Date: 07/18/2017 Time: 4497-5300 SLP Time Calculation (min) (ACUTE ONLY): 18 min  Assessment / Plan / Recommendation Clinical Impression  Pt with poor intake due to nausea/vomiting. She states she vomited after intake of orange juice and ice-cream caused her stomach discomfort.  She did NOT receive her chemo last night. She denies this ever happening prior to this admission- however note per chart review pt had intractable n/v symptoms prior to admit.    SLP observed pt with intake of ice chips, thin gingerale and pill with applesauce. She was able to swallow her pill whole with applesauce without difficulty nor sensation of residuals.  She did however demonstrate weak cough x1 after last bolus of thin gingerale via tsp- suspect aspiration.  Her cough is weak and not protective to clear aspirates - thus recommend continue nectar liquids via tsp with strict precautions.  Will follow up next date for dysphagia management, hopeful for dietary advancement.  Pt agreeable to plan and verbalized clinical reasoning for swallow aspirations.  Thanks.       HPI HPI: 67 y.o.femalewith a hx of pulmonary edema after second chemo, with normal Echo, Metastatic lung cancer-stage IV-metatases to brain, sternum and spineulcerated lipodermatosclerosis of LLE, HTN. Admitted with nausea and vomiting and hyponatremia. V-fib arrest 07/03/17 due to STEMI. Respiratory failure due to aspiration (?) pneumonia. note intractibile nausea vomiting prior to admit. Intubated from 4/1-4/12      SLP Plan  Continue with current plan of care;MBS(mbs today)       Recommendations  Liquids provided via: Teaspoon(ice) Medication Administration: Whole meds with puree Supervision: Full supervision/cueing for compensatory strategies Compensations: Minimize environmental distractions;Slow rate;Small  sips/bites;Chin tuck;Other (Comment)(intermittent dry swallows) Postural Changes and/or Swallow Maneuvers: Seated upright 90 degrees;Upright 30-60 min after meal                Oral Care Recommendations: Oral care BID Follow up Recommendations: Skilled Nursing facility SLP Visit Diagnosis: Dysphagia, oropharyngeal phase (R13.12) Plan: Continue with current plan of care;MBS(mbs today)       GO                Macario Golds 07/18/2017, 5:37 PM  Luanna Salk, [place] [facility] SLP [phone]

## 2017-07-18 NOTE — Plan of Care (Signed)
  Problem: Nutrition: Goal: Adequate nutrition will be maintained Outcome: Not Progressing  Poor PO intake due to  N/V.

## 2017-07-18 NOTE — Progress Notes (Signed)
RN notified the MD that the patient had a blood glucose of 60 earlier today. Asked PCP if  appropriate to write orders for Blood Glucose checks. Awaiting any new orders.

## 2017-07-18 NOTE — Progress Notes (Signed)
[facility] PROGRESS NOTE    Progress Note  Jennifer Delgado  NWG:[id number] DOB: 11-Oct-1950 DOA: 06/29/2017 PCP: Patient, No Pcp Per     Brief Narrative:   Jennifer Delgado is an 67 y.o. female past medical history of non-small cell stage IV carcinoma who presents with cardiac arrest on 4 1 for V. fib in the setting of hyponatremia and pneumonia, she developed V. fib arrest in house was intubated and resuscitated and transferred to [facility] extubated on 07/16/2017.  Oncology was consulted who recommended to start oral chemotherapy.  The patient at this time is vomiting having the severe dysphagia swallowing evaluation was done which results are pending at this time currently she is in a thick nectar full liquid diet, once the patient is able to tolerate oral can start the oral chemotherapy. She will did develop acute renal failure likely due to volume overload and she has diuresed well and his creatinine has returned to baseline now on oral Lasix once a day. Awaiting the patient to tolerated her diet without vomiting in order to try to get her to skilled nursing facility.   Assessment/Plan:   Acute on chronic respiratory failure with hypoxia (HCC) setting of HCAP and possibly COPD exacerbation with heart failure: In the setting of V. fib arrest status post extubation and resuscitated.Marland Kitchen Extubated on 07/16/2017 She completed her antibiotic regimen in house. Therapy evaluated the patient the recommended nursing facility.  Acute encephalopathy: Likely due to hyponatremia and infectious etiology now resolved.  Severe dysphagia: Speech was consulted recommended MMB VS still awaiting results. His was nauseated this morning, will continue clear liquid diet  V. fib arrest: Continue metoprolol and amiodarone Etiology deemed her not a candidate for AICD or invasive procedure we will pursue a aggressive medical management.  Hyponatremia: Now resolved.  Acute kidney injury/with moderate fluid  overload: He was started on IV diuresis with moderate response.,  Lasix to orals once a day as her creatinine has had a mild rise recheck in the morning.  Essential hypertension: Continue Lasix will increase lisinopril.  Normocytic anemia:  Non-small cell lung cancer with metastasis ([place]) Oncology was consulted,started on third-generation treatment with Lorlatinib but only for 10 days. Awaiting a swallowing evaluation results pending  Depression Continue Zoloft.  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  DVT prophylaxis: lovenox Family Communication:none Disposition Plan/Barrier to D/C: home in 2 days Code Status:     Code Status Orders  (From admission, onward)        Start     Ordered   07/03/17 1024  Do not attempt resuscitation (DNR)  Continuous    Question Answer Comment  In the event of cardiac or respiratory ARREST Do not call a "code blue"   In the event of cardiac or respiratory ARREST Do not perform Intubation, CPR, defibrillation or ACLS   In the event of cardiac or respiratory ARREST Use medication by any route, position, wound care, and other measures to relive pain and suffering. May use oxygen, suction and manual treatment of airway obstruction as needed for comfort.      07/03/17 1024    Code Status History    Date Active Date Inactive Code Status Order ID Comments User Context   07/03/2017 0633 07/03/2017 1024 DNR [id number]  Kandice Hams, MD Inpatient   07/03/2017 0503 07/03/2017 5284 DNR [id number]  Kandice Hams, MD Inpatient   07/03/2017 0238 07/03/2017 7253  Full Code [id number]  Omar Person, NP Inpatient   06/30/2017 0507 07/03/2017 0238 Full Code [id number]  Norval Morton, MD Inpatient        IV Access:    Peripheral IV   Procedures and diagnostic studies:   Dg Swallowing Func-speech Pathology  Result Date: 07/17/2017 Objective  Swallowing Evaluation: Type of Study: MBS-Modified Barium Swallow Study  Patient Details Name: Jennifer Delgado MRN: [id number] Date of Birth: [birth year] Today's Date: 07/17/2017 Time: SLP Start Time (ACUTE ONLY): 1240 -SLP Stop Time (ACUTE ONLY): 2703 SLP Time Calculation (min) (ACUTE ONLY): 25 min Past Medical History: Past Medical History: Diagnosis Date . Brain cancer ([place])  . Cancer ([place])   lung . Cellulitis  . Hypertension  . Liver cancer ([place])  . Renal disorder  Past Surgical History: Past Surgical History: Procedure Laterality Date . BRONCHOSCOPY   . LEG SURGERY   . THORACENTESIS   HPI: 67 y.o.femalewith a hx of pulmonary edema after second chemo, with normal Echo, Metastatic lung cancer-stage IV-metatases to brain, sternum and spineulcerated lipodermatosclerosis of LLE, HTN. Admitted with nausea and vomiting and hyponatremia. V-fib arrest 07/03/17 due to STEMI. Respiratory failure due to aspiration (?) pneumonia. note intractibile nausea vomiting prior to admit. Intubated from 4/1-4/12  Subjective: pt awake in chair Assessment / Plan / Recommendation CHL IP CLINICAL IMPRESSIONS 07/17/2017 Clinical Impression Pt presents with moderately severe oropharyngeal dysphagia with resultant aspiration of thin, nectar liquids due to decreased laryngeal closure and poor timing of swallow.  Chin tuck with tsps nectar prevented aspiration.  Vallecular residuals present due to decreased tongue base retraction/oral propulsion.  Pt with inconsistent awareness to vallecular residuals.  Reflexive delayed cough with aspiration was not effective to clear airway.   Barium tablet given with puree readily transited oropharynx to distal esophagus.  Recommend consider full liquid *nectar* diet with STRICT precautions (tsps with chin tuck) and full supervision.  If pt is coughing with po = she is ASPIRATING.  She will remain a risk due to her deconditioning and sensory deficits.  Hopeful for ongoing improvement as pt medically improves.   Recommended pt strengthen her cough/expectoration ability using teach back.  Dependent on her tolerance, she may benefit from repeat MBS.  Of note, pt asking if she can take her chemo pill today - advised her to speak to her MD.    SLP Visit Diagnosis Dysphagia, oropharyngeal phase (R13.12) Attention and concentration deficit following -- Frontal lobe and executive function deficit following -- Impact on safety and function Moderate aspiration risk   CHL IP TREATMENT RECOMMENDATION 07/17/2017 Treatment Recommendations Therapy as outlined in treatment plan below   Prognosis 07/17/2017 Prognosis for Safe Diet Advancement Guarded Barriers to Reach Goals Severity of deficits;Time post onset Barriers/Prognosis Comment -- CHL IP DIET RECOMMENDATION 07/17/2017 SLP Diet Recommendations Other (Comment);Nectar thick liquid Liquid Administration via -- Medication Administration Crushed with puree Compensations Minimize environmental distractions;Small sips/bites;Slow rate;Effortful swallow;Chin tuck;Multiple dry swallows after each bite/sip Postural Changes Remain semi-upright after after feeds/meals (Comment);Seated upright at 90 degrees   CHL IP OTHER RECOMMENDATIONS 07/17/2017 Recommended Consults -- Oral Care Recommendations Other (Comment);Oral care BID Other Recommendations Have oral suction available;Order thickener from pharmacy;Clarify dietary restrictions   CHL IP FOLLOW UP RECOMMENDATIONS 07/17/2017 Follow up Recommendations Skilled Nursing facility   [facility] IP FREQUENCY AND DURATION 07/17/2017 Speech Therapy Frequency (ACUTE ONLY) min 2x/week Treatment Duration 2 weeks      CHL IP ORAL PHASE 07/17/2017 Oral Phase Impaired Oral - Pudding Teaspoon -- Oral - Pudding Cup --  Oral - Honey Teaspoon -- Oral - Honey Cup -- Oral - Nectar Teaspoon Decreased bolus cohesion;Delayed oral transit;Lingual/palatal residue;Reduced posterior propulsion;Weak lingual manipulation Oral - Nectar Cup Decreased bolus cohesion;Lingual/palatal  residue;Reduced posterior propulsion;Weak lingual manipulation;Delayed oral transit Oral - Nectar Straw Decreased bolus cohesion;Delayed oral transit;Lingual/palatal residue;Reduced posterior propulsion;Weak lingual manipulation Oral - Thin Teaspoon Decreased bolus cohesion;Lingual/palatal residue;Reduced posterior propulsion;Weak lingual manipulation;Delayed oral transit Oral - Thin Cup Decreased bolus cohesion;Lingual/palatal residue;Reduced posterior propulsion;Weak lingual manipulation;Delayed oral transit Oral - Thin Straw Decreased bolus cohesion;Lingual/palatal residue;Reduced posterior propulsion;Weak lingual manipulation;Delayed oral transit Oral - Puree Decreased bolus cohesion;Lingual/palatal residue;Reduced posterior propulsion;Weak lingual manipulation;Delayed oral transit Oral - Mech Soft Decreased bolus cohesion;Lingual/palatal residue;Reduced posterior propulsion;Weak lingual manipulation;Delayed oral transit Oral - Regular -- Oral - Multi-Consistency -- Oral - Pill Decreased bolus cohesion;Lingual/palatal residue;Reduced posterior propulsion;Weak lingual manipulation;Delayed oral transit Oral Phase - Comment --  CHL IP PHARYNGEAL PHASE 07/17/2017 Pharyngeal Phase Impaired Pharyngeal- Pudding Teaspoon -- Pharyngeal -- Pharyngeal- Pudding Cup -- Pharyngeal -- Pharyngeal- Honey Teaspoon -- Pharyngeal -- Pharyngeal- Honey Cup -- Pharyngeal -- Pharyngeal- Nectar Teaspoon Delayed swallow initiation-vallecula Pharyngeal -- Pharyngeal- Nectar Cup Delayed swallow initiation-vallecula Pharyngeal -- Pharyngeal- Nectar Straw Delayed swallow initiation-vallecula;Penetration/Aspiration during swallow Pharyngeal Material enters airway, passes BELOW cords without attempt by patient to eject out (silent aspiration) Pharyngeal- Thin Teaspoon Penetration/Aspiration before swallow;Penetration/Aspiration during swallow;Pharyngeal residue - valleculae Pharyngeal Material enters airway, passes BELOW cords and not ejected  out despite cough attempt by patient Pharyngeal- Thin Cup Penetration/Aspiration during swallow;Penetration/Aspiration before swallow;Pharyngeal residue - valleculae Pharyngeal Material enters airway, passes BELOW cords and not ejected out despite cough attempt by patient Pharyngeal- Thin Straw Delayed swallow initiation-vallecula;Reduced pharyngeal peristalsis;Pharyngeal residue - valleculae;Moderate aspiration Pharyngeal -- Pharyngeal- Puree Pharyngeal residue - valleculae;Reduced tongue base retraction;Reduced pharyngeal peristalsis;Reduced epiglottic inversion Pharyngeal -- Pharyngeal- Mechanical Soft Reduced tongue base retraction;Reduced pharyngeal peristalsis;Pharyngeal residue - valleculae Pharyngeal -- Pharyngeal- Regular -- Pharyngeal -- Pharyngeal- Multi-consistency -- Pharyngeal -- Pharyngeal- Pill Delayed swallow initiation-vallecula;Reduced tongue base retraction;Pharyngeal residue - valleculae Pharyngeal -- Pharyngeal Comment pt's cough is weak and not protective, "Hock" and expectoration also weak, cued dry swallows decrease residuals, chin tuck and cued strong swallow helpful with nectar - but not thin, as testing progressed, pt with increased residuals/penetration - concerning for fatigue  CHL IP CERVICAL ESOPHAGEAL PHASE 07/17/2017 Cervical Esophageal Phase WFL Pudding Teaspoon -- Pudding Cup -- Honey Teaspoon -- Honey Cup -- Nectar Teaspoon -- Nectar Cup -- Nectar Straw -- Thin Teaspoon -- Thin Cup -- Thin Straw -- Puree -- Mechanical Soft -- Regular -- Multi-consistency -- Pill -- Cervical Esophageal Comment -- No flowsheet data found. Luanna Salk, [facility] SLP [phone]                Medical Consultants:    None.  Anti-Infectives:   none  Subjective:    Kairah Leoni has no new complaints lying comfortably in bed.  Objective:    Vitals:   07/17/17 1326 07/17/17 1938 07/17/17 2015 07/18/17 0512  BP: (!) 130/55  (!) 139/55 (!) 132/56  Pulse: 62  71 81  Resp:   20 20    Temp: 98.5 F (36.9 C)  97.9 F (36.6 C) (!) 97.5 F (36.4 C)  TempSrc: Axillary  Oral   SpO2: 97% 95% 98% 97%  Weight:      Height:        Intake/Output Summary (Last 24 hours) at 07/18/2017 0836 Last data filed at 07/17/2017 1500 Gross per 24 hour  Intake 512.08 ml  Output 200 ml  Net 312.08 ml   Filed Weights   07/15/17 0455 07/16/17 0445 07/17/17 0500  Weight: 103.2 kg (227 lb 8.2 oz) 104.5 kg (230 lb 6.1 oz) 103.9 kg (229 lb 0.9 oz)    Exam: General exam: In no acute distress. Respiratory system: Good air movement and clear to auscultation. Cardiovascular system: S1 & S2 heard, RRR.  Gastrointestinal system: Abdomen is nondistended, soft and nontender.  Central nervous system: Alert and oriented. No focal neurological deficits. Extremities: No pedal edema. Skin: No rashes, lesions or ulcers Psychiatry: Appears with a depressed mood with poor insight on her condition.   Data Reviewed:    Labs: Basic Metabolic Panel: Recent Labs  Lab 07/14/17 0405 07/15/17 0436 07/16/17 0440 07/17/17 0935 07/18/17 0450  NA 149* 151* 146* 142 145  K 4.9 4.5 4.1 3.3* 3.4*  CL 110 112* 107 106 105  CO2 29 27 28 26 26   GLUCOSE 145* 130* 133* 116* 105*  BUN 46* 42* 46* 44* 39*  CREATININE 0.75 0.78 0.82 0.80 0.93  CALCIUM 9.0 9.1 8.8* 8.7* 8.9  MG  --   --   --  2.3  --    GFR Estimated Creatinine Clearance: 72.2 mL/min (by C-G formula based on SCr of 0.93 mg/dL). Liver Function Tests: Recent Labs  Lab 07/17/17 0409  AST 18  ALT 20  ALKPHOS 85  BILITOT 0.5  PROT 6.6  ALBUMIN 2.9*   No results for input(s): LIPASE, AMYLASE in the last 168 hours. No results for input(s): AMMONIA in the last 168 hours. Coagulation profile No results for input(s): INR, PROTIME in the last 168 hours.  CBC: Recent Labs  Lab 07/14/17 0405 07/15/17 0436 07/16/17 0440 07/17/17 0409 07/18/17 0450  WBC 7.4 11.6* 11.8* 12.2* 10.0  NEUTROABS 6.6 10.6* 10.9* 10.3* 8.6*  HGB 8.4* 9.4*  9.0* 8.4* 8.8*  HCT 27.8* 32.3* 30.4* 28.0* 29.0*  MCV 99.3 100.3* 99.3 98.9 97.6  PLT 315 373 370 341 308   Cardiac Enzymes: No results for input(s): CKTOTAL, CKMB, CKMBINDEX, TROPONINI in the last 168 hours. BNP (last 3 results) No results for input(s): PROBNP in the last 8760 hours. CBG: Recent Labs  Lab 07/17/17 2040 07/17/17 2141 07/18/17 0035 07/18/17 0509 07/18/17 0706  GLUCAP 67 104* 94 74 89   D-Dimer: No results for input(s): DDIMER in the last 72 hours. Hgb A1c: No results for input(s): HGBA1C in the last 72 hours. Lipid Profile: No results for input(s): CHOL, HDL, LDLCALC, TRIG, CHOLHDL, LDLDIRECT in the last 72 hours. Thyroid function studies: No results for input(s): TSH, T4TOTAL, T3FREE, THYROIDAB in the last 72 hours.  Invalid input(s): FREET3 Anemia work up: No results for input(s): VITAMINB12, FOLATE, FERRITIN, TIBC, IRON, RETICCTPCT in the last 72 hours. Sepsis Labs: Recent Labs  Lab 07/15/17 0436 07/16/17 0440 07/17/17 0409 07/18/17 0450  WBC 11.6* 11.8* 12.2* 10.0   Microbiology No results found for this or any previous visit (from the past 240 hour(s)).   Medications:   . acetaminophen  650 mg Oral Q6H  . amiodarone  400 mg Oral Daily  . arformoterol  15 mcg Nebulization BID  . aspirin  81 mg Oral Daily  . atorvastatin  40 mg Oral q1800  . chlorhexidine  15 mL Mouth Rinse BID  . Chlorhexidine Gluconate Cloth  6 each Topical Daily  . enoxaparin (LOVENOX) injection  40 mg Subcutaneous Q24H  . furosemide  20 mg Oral BID  . gabapentin  450 mg Oral QHS  . insulin  aspart  3-9 Units Subcutaneous Q4H  . isosorbide mononitrate  30 mg Oral Daily  . lidocaine  1 patch Transdermal Q24H  . lisinopril  20 mg Oral Daily  . mouth rinse  15 mL Mouth Rinse q12n4p  . metoprolol tartrate  25 mg Oral BID  . multivitamin  15 mL Oral Daily  . pantoprazole sodium  40 mg Oral Q24H  . sertraline  100 mg Oral QHS   Continuous Infusions: . dextrose 10  mL/hr at 07/17/17 1125      LOS: 18 days   [place] [facility] Pager [phone]  *Please refer to [email or web address], password [email or web address] to get updated schedule on who will round on this patient, as hospitalists switch teams weekly. If 7PM-7AM, please contact night-coverage at [email or web address], password [email or web address] for any overnight needs.  07/18/2017, 8:36 AM

## 2017-07-19 LAB — CBC WITH DIFFERENTIAL/PLATELET
BASOS ABS: 0 10*3/uL (ref 0.0–0.1)
BASOS PCT: 0 %
EOS ABS: 0.3 10*3/uL (ref 0.0–0.7)
Eosinophils Relative: 4 %
HCT: 27.1 % — ABNORMAL LOW (ref 36.0–46.0)
HEMOGLOBIN: 8.3 g/dL — AB (ref 12.0–15.0)
Lymphocytes Relative: 8 %
Lymphs Abs: 0.5 10*3/uL — ABNORMAL LOW (ref 0.7–4.0)
MCH: 29.6 pg (ref 26.0–34.0)
MCHC: 30.6 g/dL (ref 30.0–36.0)
MCV: 96.8 fL (ref 78.0–100.0)
MONOS PCT: 6 %
Monocytes Absolute: 0.4 10*3/uL (ref 0.1–1.0)
NEUTROS PCT: 82 %
Neutro Abs: 5.7 10*3/uL (ref 1.7–7.7)
Platelets: 258 10*3/uL (ref 150–400)
RBC: 2.8 MIL/uL — ABNORMAL LOW (ref 3.87–5.11)
RDW: 17.3 % — ABNORMAL HIGH (ref 11.5–15.5)
WBC: 7 10*3/uL (ref 4.0–10.5)

## 2017-07-19 LAB — BASIC METABOLIC PANEL
Anion gap: 11 (ref 5–15)
BUN: 34 mg/dL — ABNORMAL HIGH (ref 6–20)
CHLORIDE: 106 mmol/L (ref 101–111)
CO2: 27 mmol/L (ref 22–32)
CREATININE: 0.85 mg/dL (ref 0.44–1.00)
Calcium: 8.4 mg/dL — ABNORMAL LOW (ref 8.9–10.3)
GFR calc non Af Amer: >60 mL/min (ref >60)
Glucose, Bld: 90 mg/dL (ref 65–99)
Potassium: 3.6 mmol/L (ref 3.5–5.1)
SODIUM: 144 mmol/L (ref 135–145)

## 2017-07-19 LAB — GLUCOSE, CAPILLARY
GLUCOSE-CAPILLARY: 68 mg/dL (ref 65–99)
GLUCOSE-CAPILLARY: 72 mg/dL (ref 65–99)
GLUCOSE-CAPILLARY: 82 mg/dL (ref 65–99)

## 2017-07-19 LAB — C DIFFICILE QUICK SCREEN W PCR REFLEX
C DIFFICILE (CDIFF) TOXIN: NEGATIVE
C DIFFICLE (CDIFF) ANTIGEN: NEGATIVE
C Diff interpretation: NOT DETECTED

## 2017-07-19 MED ORDER — CHLORHEXIDINE GLUCONATE CLOTH 2 % EX PADS
6.0000 | MEDICATED_PAD | Freq: Every day | CUTANEOUS | Status: DC
  Administered 2017-07-19 – 2017-07-21 (×2): 6 via TOPICAL

## 2017-07-19 MED ORDER — MUPIROCIN 2 % EX OINT
1.0000 "application " | TOPICAL_OINTMENT | Freq: Two times a day (BID) | CUTANEOUS | Status: DC
  Administered 2017-07-19 – 2017-07-21 (×5): 1 via NASAL

## 2017-07-19 NOTE — Progress Notes (Signed)
  Speech Language Pathology Treatment: Dysphagia  Patient Details Name: Jennifer Delgado MRN: [id number] DOB: [birth year] Today's Date: 07/19/2017 Time: 5449-2010 SLP Time Calculation (min) (ACUTE ONLY): 19 min  Assessment / Plan / Recommendation Clinical Impression  Pt today alert and reporting no issues with nausea - Pt accepting of nectar gingerale via straw - She is able to conduct chin tuck and state clinical reasoning for diet modifier/compensation strategy with mod I.  She did benefit from moderate verbal/visual cueing for adequacy of chin tuck posture.  Cough noted x1 of 6 boluses of nectar - due to aspiration (based on MBS) - but small boluses with chin tuck tolerated better.  Pt admits to improvement in her swallowing ability - her voice remains dysphonic - is marginally better.  If pt's voice does not resolve in the future, she would benefit from seeing an ENT to determine integrity given difficult and prolonged intubation.    Recommend continue full liquid - nectar currently - advancing to use of straw with chin tuck. Allow pt to continue ice chip boluses.  MBS indicated prior to liquid advancement - Pt agreeable to plan. SLP updated MD Quincy Simmonds at bedside.  Pt is demonstrating improvement in her swallow function - but voice and cough strength remain impaired thus continued strict aspiration precautions indicated.    HPI HPI: 67 y.o.femalewith a hx of pulmonary edema after second chemo, with normal Echo, Metastatic lung cancer-stage IV-metatases to brain, sternum and spineulcerated lipodermatosclerosis of LLE, HTN. Admitted with nausea and vomiting and hyponatremia. V-fib arrest 07/03/17 due to STEMI. Respiratory failure due to aspiration (?) pneumonia. note intractibile nausea vomiting prior to admit. Intubated from 4/1-4/12  Pt reports improvement with nausea today.        SLP Plan  Continue with current plan of care(MBS indicated prior to dietary advancement)       Recommendations   Diet recommendations: Nectar-thick liquid;Other(comment)(full liquids - nectar via straw) Liquids provided via: Straw Medication Administration: Whole meds with puree Supervision: Staff to assist with self feeding Compensations: Minimize environmental distractions;Chin tuck;Use straw to facilitate chin tuck(intermittent dry swallow) Postural Changes and/or Swallow Maneuvers: Seated upright 90 degrees;Chin tuck                Oral Care Recommendations: Oral care QID Follow up Recommendations: Skilled Nursing facility SLP Visit Diagnosis: Dysphagia, oropharyngeal phase (R13.12) Plan: Continue with current plan of care(MBS indicated prior to dietary advancement)       GO              Luanna Salk, MS [facility] SLP [phone]   Macario Golds 07/19/2017, 12:06 PM

## 2017-07-19 NOTE — Progress Notes (Signed)
SLP Cancellation Note  Patient Details Name: Jennifer Delgado MRN: [id number] DOB: 18-Aug-1950   Cancelled treatment:       Reason Eval/Treat Not Completed: Other (comment)(pt currently sleeping - will continue efforts)   Claudie Fisherman, [facility] SLP [phone]

## 2017-07-19 NOTE — Progress Notes (Signed)
PROGRESS NOTE [facility]   IFO:[id number] DOB: 30-Jun-1950  DOA: 06/29/2017 PCP: Jennifer Delgado   Brief Narrative:  Jennifer Delgado is an 67 y.o. female past medical history of non-small cell stage IV carcinoma who presents with cardiac arrest on 4/1 for V. fib in the setting of hyponatremia and pneumonia, she developed V. fib arrest in house was intubated and resuscitated and transferred to ICU extubated on 07/16/2017.  Patient completed antibiotic therapy for pneumonia. Oncology was consulted for non-small cell stage IV carcinoma, who recommended to start oral chemotherapy.  During hospital stay patient developed acute renal failure due to volume overload and patient responded well to diuresis bringing down creatinine to baseline.  Clinical course has been complicated with dysphagia with nausea and vomiting.  Swallow evaluation was performed which showed risk for aspiration.  Diet being advanced slowly.  PT evaluated patient and recommending SNF.  Subjective: Patient seen and examined, report feeling better.  Denies nausea and vomiting.  Was able to tolerate all oral medications today.  Remains afebrile  Assessment & Plan: Acute on chronic respiratory failure with hypoxia Multifactorial from HCAP, COPD exacerbation and heart failure. Patient was diuresed, she will continue on Lasix once a day. Patient completed antibiotic regimen during hospital stay, no signs of infection at this time. Wean oxygen as able keep O2 sat above 91%  Status post cardiac arrest in setting of V. fib No sequela, continue metoprolol and amiodarone Cardiology team candidate for a ICD or invasive procedures  Acute metabolic encephalopathy Due to hyponatremia and infectious process now has resolved. Patient back to baseline mentally  Acute kidney injury Creatinine is back to baseline she was treated with IV  diuresis as this was felt to be secondary to volume overload.  Essential  hypertension Continue current regimen  Non-small cell  lung cancer with metastasis Oncology was consulted and recommended to start oral chemotherapy Lorlatinib for 10 days. Awaiting oncology   Severe dysphagia Sequela of post intubation Speech pathology recommendations appreciated, continue full liquid diet  DVT prophylaxis: Lovenox Code Status: Full code Family Communication: None at bedside Disposition Plan: Home in 1-2-days if tolerating oral diet.  Consultants:   None  Procedures:   None  Antimicrobials:  None   Objective: Vitals:   07/19/17 0010 07/19/17 0538 07/19/17 0858 07/19/17 1049  BP: (!) 129/54 (!) 127/50  139/64  Pulse: 80 77  71  Resp:  16    Temp:  97.8 F (36.6 C)    TempSrc:  Oral    SpO2:  96% 97%   Weight:  103.1 kg (227 lb 4.7 oz)    Height:        Intake/Output Summary (Last 24 hours) at 07/19/2017 1426 Last data filed at 07/19/2017 1000 Gross Delgado 24 hour  Intake 699.16 ml  Output 1000 ml  Net -300.84 ml   Filed Weights   07/17/17 0500 07/18/17 0846 07/19/17 0538  Weight: 103.9 kg (229 lb 0.9 oz) 106.1 kg (233 lb 14.5 oz) 103.1 kg (227 lb 4.7 oz)    Examination:  General exam: Appears calm and comfortable  Respiratory system: Clear to auscultation. No wheezes,crackle or rhonchi Cardiovascular system: S1 & S2 heard, RRR. No JVD, murmurs, rubs or gallops Gastrointestinal system: Abdomen is nondistended, soft and nontender.  Central nervous system: Alert and oriented. No focal neurological deficits. Extremities: No pedal edema.    Skin: No rashes, lesions or ulcers Psychiatry: Mood & affect appropriate.    Data Reviewed:  I have personally reviewed following labs and imaging studies  CBC: Recent Labs  Lab 07/15/17 0436 07/16/17 0440 07/17/17 0409 07/18/17 0450 07/19/17 0515  WBC 11.6* 11.8* 12.2* 10.0 7.0  NEUTROABS 10.6* 10.9* 10.3* 8.6* 5.7  HGB 9.4* 9.0* 8.4* 8.8* 8.3*  HCT 32.3* 30.4* 28.0* 29.0* 27.1*  MCV 100.3*  99.3 98.9 97.6 96.8  PLT 373 370 341 308 270   Basic Metabolic Panel: Recent Labs  Lab 07/15/17 0436 07/16/17 0440 07/17/17 0935 07/18/17 0450 07/19/17 0515  NA 151* 146* 142 145 144  K 4.5 4.1 3.3* 3.4* 3.6  CL 112* 107 106 105 106  CO2 27 28 26 26 27   GLUCOSE 130* 133* 116* 105* 90  BUN 42* 46* 44* 39* 34*  CREATININE 0.78 0.82 0.80 0.93 0.85  CALCIUM 9.1 8.8* 8.7* 8.9 8.4*  MG  --   --  2.3  --   --    GFR: Estimated Creatinine Clearance: 78.5 mL/min (by C-G formula based on SCr of 0.85 mg/dL). Liver Function Tests: Recent Labs  Lab 07/17/17 0409  AST 18  ALT 20  ALKPHOS 85  BILITOT 0.5  PROT 6.6  ALBUMIN 2.9*   No results for input(s): LIPASE, AMYLASE in the last 168 hours. No results for input(s): AMMONIA in the last 168 hours. Coagulation Profile: No results for input(s): INR, PROTIME in the last 168 hours. Cardiac Enzymes: No results for input(s): CKTOTAL, CKMB, CKMBINDEX, TROPONINI in the last 168 hours. BNP (last 3 results) No results for input(s): PROBNP in the last 8760 hours. HbA1C: No results for input(s): HGBA1C in the last 72 hours. CBG: Recent Labs  Lab 07/18/17 1136 07/18/17 1223 07/18/17 1758 07/18/17 2147 07/19/17 1314  GLUCAP 60* 85 78 82 82   Lipid Profile: No results for input(s): CHOL, HDL, LDLCALC, TRIG, CHOLHDL, LDLDIRECT in the last 72 hours. Thyroid Function Tests: No results for input(s): TSH, T4TOTAL, FREET4, T3FREE, THYROIDAB in the last 72 hours. Anemia Panel: No results for input(s): VITAMINB12, FOLATE, FERRITIN, TIBC, IRON, RETICCTPCT in the last 72 hours. Sepsis Labs: No results for input(s): PROCALCITON, LATICACIDVEN in the last 168 hours.  No results found for this or any previous visit (from the past 240 hour(s)).    Radiology Studies: No results found.    Scheduled Meds: . amiodarone  400 mg Oral Daily  . arformoterol  15 mcg Nebulization BID  . aspirin  81 mg Oral Daily  . atorvastatin  40 mg Oral q1800    . chlorhexidine  15 mL Mouth Rinse BID  . Chlorhexidine Gluconate Cloth  6 each Topical Daily  . Chlorhexidine Gluconate Cloth  6 each Topical Q0600  . enoxaparin (LOVENOX) injection  40 mg Subcutaneous Q24H  . furosemide  20 mg Oral Daily  . gabapentin  450 mg Oral QHS  . isosorbide mononitrate  30 mg Oral Daily  . lidocaine  1 patch Transdermal Q24H  . lisinopril  20 mg Oral Daily  . mouth rinse  15 mL Mouth Rinse q12n4p  . metoprolol tartrate  25 mg Oral BID  . multivitamin  15 mL Oral Daily  . mupirocin ointment  1 application Nasal BID  . pantoprazole sodium  40 mg Oral Q24H  . sertraline  100 mg Oral QHS   Continuous Infusions: . dextrose 10 mL/hr at 07/17/17 1125     LOS: 19 days    Time spent: Total of 25 minutes spent with pt, greater than 50% of which was spent in discussion of  treatment, counseling and coordination of care    Chipper Oman, MD Pager: Text Page via [email or web address]   If 7PM-7AM, please contact night-coverage [email or web address] 07/19/2017, 2:26 PM   Note - This record has been created using [employer]. Chart creation errors have been sought, but may not always have been located. Such creation errors do not reflect on the standard of medical care.

## 2017-07-19 NOTE — Progress Notes (Signed)
Physical Therapy Treatment Patient Details Name: Jennifer Delgado MRN: [id number] DOB: 1950/07/17 Today's Date: 07/19/2017    History of Present Illness 67 y/o female with stage IV NSCLC who had a cardiac arrest on 4/1 from VF in the setting of hyponatremia and pneumonia    PT Comments    Patient progressing with able to stand x 2 with rollator and her shoes on.  Still very limited standing tolerance and, though improved, still with poor sitting balance.  Feel she may benefit from vital go total lift bed for improved upright tolerance, for skin protection and for standing trials with less risk for falls.  PT to follow acutely.  Follow Up Recommendations  SNF;Supervision/Assistance - 24 hour     Equipment Recommendations  None recommended by PT    Recommendations for Other Services       Precautions / Restrictions Precautions Precautions: Fall    Mobility  Bed Mobility Overal bed mobility: Needs Assistance Bed Mobility: Rolling Rolling: Max assist   Supine to sit: Max assist;+2 for physical assistance     General bed mobility comments: assist to roll initially due to soiled from diarrhea.  Then sat up after rolling to side with assist for legs off bed and for trunk upright; to supine assist +2 total A due to pt too close to foot of bed and had to remove footboard and assist down then slide up to [facility]  Transfers Overall transfer level: Needs assistance Equipment used: 4-wheeled walker Transfers: Sit to/from Stand Sit to Stand: +2 physical assistance;Max assist         General transfer comment: donned pt's shoes that were in room and elevated height of bed, assist to stand x 2 trials with rollator and lifting pt's hips with pad under her; standing maintained briefly then sat and stood second time with little more help, and stood shorter time  Ambulation/Gait                 Stairs             Wheelchair Mobility    Modified Rankin (Stroke Patients Only)        Balance Overall balance assessment: Needs assistance Sitting-balance support: Bilateral upper extremity supported;Feet supported Sitting balance-Leahy Scale: Poor Sitting balance - Comments: sat EOB about 7 minutes working on anterior and L shift for finding COG over BOS, initially mod support then able to back off to close S and cues w/ occ minguard Postural control: Posterior lean;Right lateral lean Standing balance support: Bilateral upper extremity supported Standing balance-Leahy Scale: Zero Standing balance comment: +2 A with UE support to stand and weight behind pt                            Cognition Arousal/Alertness: Awake/alert Behavior During Therapy: WFL for tasks assessed/performed Overall Cognitive Status: Within Functional Limits for tasks assessed                                        Exercises      General Comments General comments (skin integrity, edema, etc.): incontinent of liquid stool x 3 during session; cleaned each time with reddened/excoriated skin and applied barrier cream.  May benefit from vital go total lift bed for pressure relief and longer standing tolerance.       Pertinent Vitals/Pain Pain Assessment: Faces Faces Pain Scale:  Hurts little more Pain Location: buttocks with wiping Pain Descriptors / Indicators: Tender Pain Intervention(s): Monitored during session;Repositioned    Home Living                      Prior Function            PT Goals (current goals can now be found in the care plan section) Progress towards PT goals: Progressing toward goals    Frequency    Min 3X/week      PT Plan Current plan remains appropriate    Co-evaluation              AM-PAC PT "6 Clicks" Daily Activity  Outcome Measure  Difficulty turning over in bed (including adjusting bedclothes, sheets and blankets)?: Unable Difficulty moving from lying on back to sitting on the side of the bed? :  Unable Difficulty sitting down on and standing up from a chair with arms (e.g., wheelchair, bedside commode, etc,.)?: Unable Help needed moving to and from a bed to chair (including a wheelchair)?: Total Help needed walking in hospital room?: Total Help needed climbing 3-5 steps with a railing? : Total 6 Click Score: 6    End of Session Equipment Utilized During Treatment: Gait belt Activity Tolerance: Patient limited by fatigue Patient left: in bed;with call bell/phone within reach   PT Visit Diagnosis: Difficulty in walking, not elsewhere classified (R26.2);Other symptoms and signs involving the nervous system (R29.898);Muscle weakness (generalized) (M62.81);Other abnormalities of gait and mobility (R26.89)     Time: 1315-1400 PT Time Calculation (min) (ACUTE ONLY): 45 min  Charges:  $Therapeutic Activity: 38-52 mins                    G CodesMagda Delgado, [place] [phone] 07/19/2017    Jennifer Delgado 07/19/2017, 5:03 PM

## 2017-07-20 ENCOUNTER — Inpatient Hospital Stay (HOSPITAL_COMMUNITY): Payer: BLUE CROSS/BLUE SHIELD

## 2017-07-20 LAB — CBC WITH DIFFERENTIAL/PLATELET
BASOS ABS: 0 10*3/uL (ref 0.0–0.1)
BASOS PCT: 0 %
Eosinophils Absolute: 0.2 10*3/uL (ref 0.0–0.7)
Eosinophils Relative: 2 %
HCT: 26.1 % — ABNORMAL LOW (ref 36.0–46.0)
HEMOGLOBIN: 8 g/dL — AB (ref 12.0–15.0)
Lymphocytes Relative: 6 %
Lymphs Abs: 0.6 10*3/uL — ABNORMAL LOW (ref 0.7–4.0)
MCH: 29.6 pg (ref 26.0–34.0)
MCHC: 30.7 g/dL (ref 30.0–36.0)
MCV: 96.7 fL (ref 78.0–100.0)
Monocytes Absolute: 0.4 10*3/uL (ref 0.1–1.0)
Monocytes Relative: 5 %
NEUTROS PCT: 87 %
Neutro Abs: 7.8 10*3/uL — ABNORMAL HIGH (ref 1.7–7.7)
Platelets: 259 10*3/uL (ref 150–400)
RBC: 2.7 MIL/uL — AB (ref 3.87–5.11)
RDW: 17.5 % — ABNORMAL HIGH (ref 11.5–15.5)
WBC: 9 10*3/uL (ref 4.0–10.5)

## 2017-07-20 LAB — GLUCOSE, CAPILLARY
GLUCOSE-CAPILLARY: 63 mg/dL — AB (ref 65–99)
GLUCOSE-CAPILLARY: 66 mg/dL (ref 65–99)
GLUCOSE-CAPILLARY: 85 mg/dL (ref 65–99)
Glucose-Capillary: 76 mg/dL (ref 65–99)
Glucose-Capillary: 76 mg/dL (ref 65–99)
Glucose-Capillary: 68 mg/dL (ref 65–99)

## 2017-07-20 MED ORDER — DIPHENOXYLATE-ATROPINE 2.5-0.025 MG PO TABS
1.0000 | ORAL_TABLET | Freq: Four times a day (QID) | ORAL | Status: DC | PRN
  Administered 2017-07-20 – 2017-07-21 (×5): 1 via ORAL
  Filled 2017-07-20 (×5): qty 1

## 2017-07-20 MED ORDER — ZINC OXIDE 40 % EX OINT
TOPICAL_OINTMENT | CUTANEOUS | Status: DC | PRN
  Administered 2017-07-20 – 2017-07-21 (×3): via TOPICAL
  Filled 2017-07-20 (×2): qty 57

## 2017-07-20 MED ORDER — LORLATINIB 100 MG PO TABS
100.0000 mg | ORAL_TABLET | Freq: Every morning | ORAL | Status: DC
  Administered 2017-07-20 – 2017-07-21 (×2): 100 mg via ORAL

## 2017-07-20 MED ORDER — METOPROLOL TARTRATE 25 MG PO TABS
25.0000 mg | ORAL_TABLET | Freq: Two times a day (BID) | ORAL | Status: DC
  Administered 2017-07-20 – 2017-07-21 (×3): 25 mg via ORAL
  Filled 2017-07-20 (×3): qty 1

## 2017-07-20 MED ORDER — ADULT MULTIVITAMIN W/MINERALS CH
1.0000 | ORAL_TABLET | Freq: Every day | ORAL | Status: DC
  Administered 2017-07-20 – 2017-07-21 (×2): 1 via ORAL
  Filled 2017-07-20 (×2): qty 1

## 2017-07-20 NOTE — Progress Notes (Signed)
Modified Barium Swallow Progress Note  Patient Details  Name: Jennifer Delgado MRN: [id number] Date of Birth: 08/27/1950  Today's Date: 07/20/2017  Modified Barium Swallow completed.  Full report located under Chart Review in the Imaging Section.  Brief recommendations include the following:  Clinical Impression  Limited amount of barium given due to pt's reported abdomen discomfort earlier today - DG abdomen negative for acute deficit prior to proceeding with MBS.  Pt presents with improved swallow function but continues with aspiration of secretions and thin liquids via cup/straw.   Trace penetration of liquids (nectar) cleared with cued throat clearing.   Aspiration continued due to decreased laryngeal closure and poor timing of swallow.  decreased tongue base retraction/oral propulsion.  Mild amount of pyriform sinus residuals present due to decreased laryngeal elevation.  Did not test solids/puree today.  Delayed reflexive cough with aspiration (cough x1 only after aspirates likely reached carina) was not effective to clear airway.   Recommend dys3/ground meats/nectar liquid with ongoing precautions.  Recommend pt be allowed thin liquids via Provale cup independently *releases 5 cc of liquids only.  Again if pt is coughing with po = she is ASPIRATING.  Recommend follow up SLP at [facility] for dysphagia management/treatment.  Of note, prior to provided pt with barium - SLP examined oral cavity - noted subtle white spots on soft palate - concerning for oral candidiasis. - Thanks for allowing me to help care for this pt.      Swallow Evaluation Recommendations       SLP Diet Recommendations: Other (Comment);Nectar thick liquid(full liquids, nectar)       Medication Administration: Crushed with puree(whole if small - start and follow with tsps nectar)   Supervision: Patient able to self feed   Compensations: Minimize environmental distractions;Small sips/bites;Slow rate;Effortful swallow;Chin  tuck;Multiple dry swallows after each bite/sip       Oral Care Recommendations: Other (Comment);Oral care BID   Other Recommendations: Have oral suction available;Order thickener from pharmacy;Clarify dietary restrictions   Luanna Salk, [place] [facility] SLP [phone]  Macario Golds 07/20/2017,3:55 PM

## 2017-07-20 NOTE — Progress Notes (Addendum)
PROGRESS NOTE [facility]   KGU:[id number] DOB: Dec 10, 1950  DOA: 06/29/2017 PCP: Patient, No Pcp Per   Brief Narrative:  Jennifer Delgado is an 67 y.o. female past medical history of non-small cell stage IV carcinoma who presents with cardiac arrest on 4/1 for V. fib in the setting of hyponatremia and pneumonia, she developed V. fib arrest in house was intubated and resuscitated and transferred to ICU extubated on 07/16/2017.  Patient completed antibiotic therapy for pneumonia. Oncology was consulted for non-small cell stage IV carcinoma, who recommended to start oral chemotherapy.  During hospital stay patient developed acute renal failure due to volume overload and patient responded well to diuresis bringing down creatinine to baseline.  Clinical course has been complicated with dysphagia with nausea and vomiting.  Swallow evaluation was performed which showed risk for aspiration.  Diet being advanced slowly.  PT evaluated patient and recommending SNF.  Subjective: Patient seen and examined, today reporting some abdominal pain.  Overnight she had some diarrhea but C. difficile was negative.  Denies nausea and vomiting.  Tolerating full liquid diet  Assessment & Plan: Acute on chronic respiratory failure with hypoxia Multifactorial from HCAP, COPD exacerbation and heart failure. Patient was diuresed, she will continue on Lasix once a day. Patient completed antibiotic regimen during hospital stay, no signs of infection at this time. Continue to wean oxygen as able to keep O2 sat above 91%.  Status post cardiac arrest in setting of V. fib No sequela, continue metoprolol and amiodarone Cardiology team not candidate for AICD or invasive procedures Continue to monitor  Acute metabolic encephalopathy - resolved Due to hyponatremia and infectious process now has resolved. Patient back to baseline mentally  Acute kidney injury - resolved Creatinine is back to baseline she was  treated with IV  diuresis as this was felt to be secondary to volume overload.  Essential hypertension Continue current regimen  Non-small cell lung cancer with metastasis Oncology was consulted and recommended to start oral chemotherapy Lorlatinib for 10 days. Awaiting oncology   Severe dysphagia Sequela of post intubation Will need modified barium swallow, patient has been placed on soft diet with nectar thick liquids.  Abdominal pain Patient having diarrhea, slight abdominal distention. Will obtain abdominal x-ray  DVT prophylaxis: Lovenox Code Status: Full code Family Communication: None at bedside Disposition Plan: SNF in a.m. if remains stable.  Consultants:   None  Procedures:   None  Antimicrobials:  None   Objective: Vitals:   07/20/17 0342 07/20/17 0347 07/20/17 0808 07/20/17 0908  BP: (!) 119/55   (!) 132/53  Pulse: 79   78  Resp: 18     Temp: 97.8 F (36.6 C)   97.6 F (36.4 C)  TempSrc: Oral   Oral  SpO2: 100%  100% 96%  Weight:  103.6 kg (228 lb 6.3 oz)    Height:        Intake/Output Summary (Last 24 hours) at 07/20/2017 1150 Last data filed at 07/20/2017 0648 Gross per 24 hour  Intake 718.83 ml  Output -  Net 718.83 ml   Filed Weights   07/18/17 0846 07/19/17 0538 07/20/17 0347  Weight: 106.1 kg (233 lb 14.5 oz) 103.1 kg (227 lb 4.7 oz) 103.6 kg (228 lb 6.3 oz)    Examination:  General: NAD  Cardiovascular: RRR, S1/S2 +, no rubs, no gallops Respiratory: CTA bilaterally, no wheezing, no rhonchi Abdominal: Soft, slight distended, mild LLQ tenderness  Extremities: no edema  Data Reviewed: I have  personally reviewed following labs and imaging studies  CBC: Recent Labs  Lab 07/16/17 0440 07/17/17 0409 07/18/17 0450 07/19/17 0515 07/20/17 0459  WBC 11.8* 12.2* 10.0 7.0 9.0  NEUTROABS 10.9* 10.3* 8.6* 5.7 7.8*  HGB 9.0* 8.4* 8.8* 8.3* 8.0*  HCT 30.4* 28.0* 29.0* 27.1* 26.1*  MCV 99.3 98.9 97.6 96.8 96.7  PLT 370 341 308 258  094   Basic Metabolic Panel: Recent Labs  Lab 07/15/17 0436 07/16/17 0440 07/17/17 0935 07/18/17 0450 07/19/17 0515  NA 151* 146* 142 145 144  K 4.5 4.1 3.3* 3.4* 3.6  CL 112* 107 106 105 106  CO2 27 28 26 26 27   GLUCOSE 130* 133* 116* 105* 90  BUN 42* 46* 44* 39* 34*  CREATININE 0.78 0.82 0.80 0.93 0.85  CALCIUM 9.1 8.8* 8.7* 8.9 8.4*  MG  --   --  2.3  --   --    GFR: Estimated Creatinine Clearance: 78.8 mL/min (by C-G formula based on SCr of 0.85 mg/dL). Liver Function Tests: Recent Labs  Lab 07/17/17 0409  AST 18  ALT 20  ALKPHOS 85  BILITOT 0.5  PROT 6.6  ALBUMIN 2.9*   No results for input(s): LIPASE, AMYLASE in the last 168 hours. No results for input(s): AMMONIA in the last 168 hours. Coagulation Profile: No results for input(s): INR, PROTIME in the last 168 hours. Cardiac Enzymes: No results for input(s): CKTOTAL, CKMB, CKMBINDEX, TROPONINI in the last 168 hours. BNP (last 3 results) No results for input(s): PROBNP in the last 8760 hours. HbA1C: No results for input(s): HGBA1C in the last 72 hours. CBG: Recent Labs  Lab 07/19/17 1314 07/19/17 2120 07/19/17 2337 07/20/17 0048 07/20/17 0735  GLUCAP 82 68 72 85 76   Lipid Profile: No results for input(s): CHOL, HDL, LDLCALC, TRIG, CHOLHDL, LDLDIRECT in the last 72 hours. Thyroid Function Tests: No results for input(s): TSH, T4TOTAL, FREET4, T3FREE, THYROIDAB in the last 72 hours. Anemia Panel: No results for input(s): VITAMINB12, FOLATE, FERRITIN, TIBC, IRON, RETICCTPCT in the last 72 hours. Sepsis Labs: No results for input(s): PROCALCITON, LATICACIDVEN in the last 168 hours.  Recent Results (from the past 240 hour(s))  C difficile quick scan w PCR reflex     Status: None   Collection Time: 07/19/17  6:00 PM  Result Value Ref Range Status   C Diff antigen NEGATIVE NEGATIVE Final   C Diff toxin NEGATIVE NEGATIVE Final   C Diff interpretation No C. difficile detected.  Final    Comment:  Performed at [facility], [place] [address] [place], [place] [address]     Radiology Studies: No results found.   Scheduled Meds: . amiodarone  400 mg Oral Daily  . arformoterol  15 mcg Nebulization BID  . aspirin  81 mg Oral Daily  . atorvastatin  40 mg Oral q1800  . chlorhexidine  15 mL Mouth Rinse BID  . Chlorhexidine Gluconate Cloth  6 each Topical Daily  . Chlorhexidine Gluconate Cloth  6 each Topical Q0600  . enoxaparin (LOVENOX) injection  40 mg Subcutaneous Q24H  . furosemide  20 mg Oral Daily  . gabapentin  450 mg Oral QHS  . isosorbide mononitrate  30 mg Oral Daily  . lidocaine  1 patch Transdermal Q24H  . lisinopril  20 mg Oral Daily  . Lorlatinib  100 mg Oral q morning - 10a  . mouth rinse  15 mL Mouth Rinse q12n4p  . metoprolol tartrate  25 mg Oral BID  .  multivitamin with minerals  1 tablet Oral Daily  . mupirocin ointment  1 application Nasal BID  . pantoprazole sodium  40 mg Oral Q24H  . sertraline  100 mg Oral QHS   Continuous Infusions: . dextrose 10 mL/hr at 07/17/17 1125     LOS: 20 days    Time spent: Total of 25 minutes spent with pt, greater than 50% of which was spent in discussion of  treatment, counseling and coordination of care  Chipper Oman, MD Pager: Text Page via [email or web address]   If 7PM-7AM, please contact night-coverage [email or web address] 07/20/2017, 11:50 AM   Note - This record has been created using [employer]. Chart creation errors have been sought, but may not always have been located. Such creation errors do not reflect on the standard of medical care.

## 2017-07-20 NOTE — NC FL2 (Signed)
[place] LEVEL OF CARE SCREENING TOOL     IDENTIFICATION  Patient Name: Jennifer Delgado Birthdate: 10-16-1950 Sex: female Admission Date (Current Location): 06/29/2017  [facility] and [place] Number:  Herbalist and Address:  [facility], [place],  [place], [place]      Provider Number: [id number]  Attending Physician Name and Address:  Patrecia Pour, Christean Grief, MD  Relative Name and Phone Number:       Current Level of Care: Hospital Recommended Level of Care: [place] Prior Approval Number:    Date Approved/Denied:   PASRR Number: [id number]  Discharge Plan: SNF    Current Diagnoses: Patient Active Problem List   Diagnosis Date Noted  . Acute on chronic respiratory failure with hypoxia ([place]) 07/17/2017  . HCAP (healthcare-associated pneumonia) 07/17/2017  . Cardiac arrest ([place])   . Encounter for central line placement   . Acute encephalopathy   . Hyponatremia 06/30/2017  . Intractable nausea and vomiting 06/30/2017  . Non-small cell lung cancer with metastasis ([place]) 06/30/2017  . Lipodermatosclerosis of lower extremity due to varicose veins 06/30/2017  . Depression 06/30/2017  . GERD (gastroesophageal reflux disease) 06/30/2017    Orientation RESPIRATION BLADDER Height & Weight     Self, Time, Situation, Place  O2(2L) Incontinent Weight: 228 lb 6.3 oz (103.6 kg) Height:  5\' 5"  (165.1 cm)  BEHAVIORAL SYMPTOMS/MOOD NEUROLOGICAL BOWEL NUTRITION STATUS      (rectal tube) Diet(soft/pureed diet, nectar-thick fluids)  AMBULATORY STATUS COMMUNICATION OF NEEDS Skin   Extensive Assist Verbally (deep tissue injury-lip)                       Personal Care Assistance Level of Assistance  Bathing, Feeding, Dressing Bathing Assistance: Limited assistance Feeding assistance: Independent Dressing Assistance: Limited assistance     Functional Limitations Info  Sight, Hearing, Speech Sight Info: Adequate Hearing  Info: Adequate Speech Info: Adequate    SPECIAL CARE FACTORS FREQUENCY  PT (By licensed PT), OT (By licensed OT), Speech therapy     PT Frequency: 5x OT Frequency: 5x     Speech Therapy Frequency: 1x      Contractures Contractures Info: Not present    Additional Factors Info  Code Status, Allergies Code Status Info: full code Allergies Info: Daptomycin, Dilaudid Hydromorphone           Current Medications (07/20/2017):  This is the current hospital active medication list Current Facility-Administered Medications  Medication Dose Route Frequency Provider Last Rate Last Dose  . albuterol (PROVENTIL) (2.5 MG/3ML) 0.083% nebulizer solution 2.5 mg  2.5 mg Inhalation Q6H PRN Kipp Brood, MD      . amiodarone (PACERONE) tablet 400 mg  400 mg Oral Daily Agarwala, Einar Grad, MD   400 mg at 07/20/17 1048  . arformoterol (BROVANA) nebulizer solution 15 mcg  15 mcg Nebulization BID Kipp Brood, MD   15 mcg at 07/20/17 0808  . aspirin chewable tablet 81 mg  81 mg Oral Daily Agarwala, Einar Grad, MD   81 mg at 07/20/17 1048  . atorvastatin (LIPITOR) tablet 40 mg  40 mg Oral q1800 Kipp Brood, MD   40 mg at 07/19/17 1847  . bisacodyl (DULCOLAX) suppository 10 mg  10 mg Rectal Daily PRN Kipp Brood, MD      . chlorhexidine (PERIDEX) 0.12 % solution 15 mL  15 mL Mouth Rinse BID Agarwala, Ravi, MD   15 mL at 07/20/17 1048  . Chlorhexidine Gluconate Cloth 2 %  PADS 6 each  6 each Topical Daily Kipp Brood, MD   6 each at 07/20/17 0344  . Chlorhexidine Gluconate Cloth 2 % PADS 6 each  6 each Topical Q0600 Patrecia Pour, Christean Grief, MD   6 each at 07/19/17 1100  . dextrose 10 % infusion   Intravenous Continuous Charlynne Cousins, MD 10 mL/hr at 07/17/17 1125    . diclofenac sodium (VOLTAREN) 1 % transdermal gel 2 g  2 g Topical QID PRN Kipp Brood, MD      . diphenoxylate-atropine (LOMOTIL) 2.5-0.025 MG per tablet 1 tablet  1 tablet Oral QID PRN Patrecia Pour, Christean Grief, MD   1 tablet at 07/20/17  1101  . enoxaparin (LOVENOX) injection 40 mg  40 mg Subcutaneous Q24H Agarwala, Einar Grad, MD   40 mg at 07/20/17 0942  . furosemide (LASIX) tablet 20 mg  20 mg Oral Daily Charlynne Cousins, MD   20 mg at 07/20/17 1048  . gabapentin (NEURONTIN) 250 MG/5ML solution 450 mg  450 mg Oral QHS Kipp Brood, MD   450 mg at 07/19/17 2255  . hydrALAZINE (APRESOLINE) injection 10 mg  10 mg Intravenous Q4H PRN Kipp Brood, MD   10 mg at 07/16/17 1152  . isosorbide mononitrate (IMDUR) 24 hr tablet 30 mg  30 mg Oral Daily Agarwala, Ravi, MD   30 mg at 07/20/17 1048  . lidocaine (LIDODERM) 5 % 1 patch  1 patch Transdermal Q24H Kipp Brood, MD   1 patch at 07/19/17 1848  . lisinopril (PRINIVIL,ZESTRIL) tablet 20 mg  20 mg Oral Daily Charlynne Cousins, MD   20 mg at 07/20/17 1048  . liver oil-zinc oxide (DESITIN) 40 % ointment   Topical PRN Patrecia Pour, Christean Grief, MD      . Lorlatinib TABS 100 mg  100 mg Oral q morning - 10a Curt Bears, MD   100 mg at 07/20/17 1122  . MEDLINE mouth rinse  15 mL Mouth Rinse q12n4p Agarwala, Ravi, MD   15 mL at 07/19/17 1700  . metoprolol tartrate (LOPRESSOR) tablet 25 mg  25 mg Oral BID Patrecia Pour, Christean Grief, MD      . multivitamin with minerals tablet 1 tablet  1 tablet Oral Daily Patrecia Pour, Christean Grief, MD      . mupirocin ointment (BACTROBAN) 2 % 1 application  1 application Nasal BID Patrecia Pour, Christean Grief, MD   1 application at 72/53/66 [phone]  . ondansetron (ZOFRAN) injection 4 mg  4 mg Intravenous Q8H PRN Kipp Brood, MD   4 mg at 07/18/17 1147  . oxyCODONE (ROXICODONE) 5 MG/5ML solution 5 mg  5 mg Oral Q4H PRN Kipp Brood, MD   5 mg at 07/20/17 0904  . pantoprazole sodium (PROTONIX) 40 mg/20 mL oral suspension 40 mg  40 mg Oral Q24H Agarwala, Einar Grad, MD   40 mg at 07/20/17 1058  . promethazine (PHENERGAN) tablet 25 mg  25 mg Oral Q6H PRN Charlynne Cousins, MD       Or  . promethazine (PHENERGAN) injection 12.5 mg  12.5 mg Intravenous Q6H PRN Charlynne Cousins,  MD       Or  . promethazine (PHENERGAN) suppository 25 mg  25 mg Rectal Q6H PRN Charlynne Cousins, MD      . Between   Oral PRN Charlynne Cousins, MD      . sertraline (ZOLOFT) tablet 100 mg  100 mg Oral QHS Kipp Brood, MD   100 mg at 07/19/17 2151  . sodium  chloride flush (NS) 0.9 % injection 10-40 mL  10-40 mL Intracatheter PRN Kipp Brood, MD         Discharge Medications: Please see discharge summary for a list of discharge medications.  Relevant Imaging Results:  Relevant Lab Results:   Additional Information SS# [id number]  Nila Nephew, LCSW

## 2017-07-20 NOTE — Plan of Care (Signed)
Patient stable during 7 a to 7 p shift, able to wean O2 to 2 liters.  Patient with very flat affect, delayed responses but appears to be oriented x 4.  Nursing staff must continually encourage patient to eat and drink, patient able to hold cup speech therapy provided and drink thin gingerale from it. Patient 2 assist to roll in bed, continues to be incontinent of bowel and bladder with upwards of 4 small to moderate stools this shift.

## 2017-07-20 NOTE — Progress Notes (Signed)
  Speech Language Pathology Treatment: Dysphagia  Patient Details Name: Jennifer Delgado MRN: [id number] DOB: 01-12-1951 Today's Date: 07/20/2017 Time: 9675-9163 SLP Time Calculation (min) (ACUTE ONLY): 35 min  Assessment / Plan / Recommendation Clinical Impression  Pt seen to assess po tolerance and readiness for repeat MBS.  Voice remains dysphonic with weak cough but pt does appear with overall improved strength - Able to hold own cup and cracker to feed herself.    No indication of aspiration with thickened soda nor cracker.  Pt with very slow mastication - suspect this is due to bloating sensation and lack of desire for po - not oral dysphagia.   Pt denies any coughing with all intake but admits she has not consumed much intake due to "bloating".    Note plans for dc tomorrow to SNF and thus would recommend repeat MBS if DG Abdomen negative to assure pt on least restrictive diet.  Pt agreeable to plan.   Also note pt with whitish coating on tongue = concerning for oral candidiasis.  Coating remains despite pt oral care, , rinsing and expectorating with water and brushing her tongue.  Informed RN, pt and MD.         HPI HPI: 67 y.o.femalewith a hx of pulmonary edema after second chemo, with normal Echo, Metastatic lung cancer-stage IV-metatases to brain, sternum and spineulcerated lipodermatosclerosis of LLE, HTN. Admitted with nausea and vomiting and hyponatremia. V-fib arrest 07/03/17 due to STEMI. Respiratory failure due to aspiration (?) pneumonia. note intractibile nausea vomiting prior to admit. Intubated from 4/1-4/12  Pt reports improvement with nausea today.        SLP Plan  Continue with current plan of care;MBS(MBS today if DG abdomen negative)       Recommendations  Liquids provided via: Straw Medication Administration: Whole meds with puree Supervision: Staff to assist with self feeding Compensations: Minimize environmental distractions;Chin tuck;Use straw to  facilitate chin tuck(intermittent dry swallow) Postural Changes and/or Swallow Maneuvers: Seated upright 90 degrees;Chin tuck                Oral Care Recommendations: Oral care QID Follow up Recommendations: Skilled Nursing facility SLP Visit Diagnosis: Dysphagia, oropharyngeal phase (R13.12) Plan: Continue with current plan of care;MBS(MBS today if DG abdomen negative)       GO               Luanna Salk, MS [facility] SLP [phone]  Macario Golds 07/20/2017, 12:21 PM

## 2017-07-20 NOTE — Progress Notes (Signed)
Transferred the patient to the new "[facility] VitalGo Total Lift Bed" Bed was zeroed prior to patient being transferred to the new bed.

## 2017-07-20 NOTE — Progress Notes (Signed)
Spoke with pt concerning discharge plans for clarification of home vs SNF. Pt states that she is okay with going to SNF, will need to know her options. CSW will following for discharge to SNF.

## 2017-07-20 NOTE — Progress Notes (Addendum)
  Speech Language Pathology Treatment: Dysphagia  Patient Details Name: Jennifer Delgado MRN: [id number] DOB: 1950/04/17 Today's Date: 07/20/2017 Time: 0938-1829 SLP Time Calculation (min) (ACUTE ONLY): 25 min  Assessment / Plan / Recommendation Clinical Impression  Pt seen to provide Provale cup for allowance of thin liquids *releases 5 cc of thin with each sip.  SLP demonstrated to pt, her RN and Advertising copywriter its use.  Provided pt with thin Gingerale via cup - which she was able to hold and give to herself.  No indication of airway compromise - of not pt only coughs with repeated aspiration *when aspirates likely reach carina in trachea.    Reviewed aspiration precautions with pt and reviewed importance of use of incentive spirometer to maximize airway strength given her bed ridden status and weakness.  Pt able to reiterate precautions with mod I.   Clinically she does report improved swallow with use of thin tuck posture- thus advised she continue.  SLP wrote pt's name on her Provale cup and left box/information for her in her room.  Follow up indicated at SNF.  Anticipate pt to dc tomorrow, will follow up if here on Monday April 22.   Informed RN of concern for possible oral candidiasis.     HPI HPI: 67 y.o.femalewith a hx of pulmonary edema after second chemo, with normal Echo, Metastatic lung cancer-stage IV-metatases to brain, sternum and spineulcerated lipodermatosclerosis of LLE, HTN. Admitted with nausea and vomiting and hyponatremia. V-fib arrest 07/03/17 due to STEMI. Respiratory failure due to aspiration (?) pneumonia. note intractibile nausea vomiting prior to admit. Intubated from 4/1-4/12  Pt reports improvement with nausea today.        SLP Plan  Continue with current plan of care;MBS(MBS today if DG abdomen negative)       Recommendations  Diet recommendations: Thin liquid;Nectar-thick liquid;Dysphagia 3 (mechanical soft) Liquids provided via: Straw(thin via provale  cup) Medication Administration: Whole meds with puree Supervision: Staff to assist with self feeding Compensations: Minimize environmental distractions;Chin tuck;Use straw to facilitate chin tuck;Clear throat after each swallow(intermittent dry swallow) Postural Changes and/or Swallow Maneuvers: Seated upright 90 degrees;Chin tuck                Oral Care Recommendations: Oral care QID Follow up Recommendations: Skilled Nursing facility SLP Visit Diagnosis: Dysphagia, oropharyngeal phase (R13.12) Plan: Continue with current plan of care;MBS(MBS today if DG abdomen negative)       GO               Luanna Salk, MS [facility] SLP [phone]  Macario Golds 07/20/2017, 3:58 PM

## 2017-07-21 LAB — CBC WITH DIFFERENTIAL/PLATELET
BASOS PCT: 0 %
Basophils Absolute: 0 10*3/uL (ref 0.0–0.1)
Eosinophils Absolute: 0.3 10*3/uL (ref 0.0–0.7)
Eosinophils Relative: 4 %
HCT: 26.1 % — ABNORMAL LOW (ref 36.0–46.0)
Hemoglobin: 8 g/dL — ABNORMAL LOW (ref 12.0–15.0)
LYMPHS ABS: 1 10*3/uL (ref 0.7–4.0)
Lymphocytes Relative: 11 %
MCH: 29.2 pg (ref 26.0–34.0)
MCHC: 30.7 g/dL (ref 30.0–36.0)
MCV: 95.3 fL (ref 78.0–100.0)
MONOS PCT: 5 %
Monocytes Absolute: 0.5 10*3/uL (ref 0.1–1.0)
NEUTROS ABS: 7.6 10*3/uL (ref 1.7–7.7)
NEUTROS PCT: 80 %
Platelets: 255 10*3/uL (ref 150–400)
RBC: 2.74 MIL/uL — ABNORMAL LOW (ref 3.87–5.11)
RDW: 16.8 % — ABNORMAL HIGH (ref 11.5–15.5)
WBC: 9.4 10*3/uL (ref 4.0–10.5)

## 2017-07-21 LAB — GLUCOSE, CAPILLARY
GLUCOSE-CAPILLARY: 69 mg/dL (ref 65–99)
GLUCOSE-CAPILLARY: 76 mg/dL (ref 65–99)
GLUCOSE-CAPILLARY: 59 mg/dL — AB (ref 65–99)
Glucose-Capillary: 81 mg/dL (ref 65–99)
Glucose-Capillary: 81 mg/dL (ref 65–99)
Glucose-Capillary: 85 mg/dL (ref 65–99)
Glucose-Capillary: 94 mg/dL (ref 65–99)

## 2017-07-21 MED ORDER — ATORVASTATIN CALCIUM 40 MG PO TABS: 40.0000 mg | ORAL_TABLET | Freq: Every day | ORAL | Status: AC

## 2017-07-21 MED ORDER — SERTRALINE HCL 100 MG PO TABS: 100.0000 mg | ORAL_TABLET | Freq: Every day | ORAL | Status: AC

## 2017-07-21 MED ORDER — PANTOPRAZOLE SODIUM 40 MG PO TBEC
40.0000 mg | DELAYED_RELEASE_TABLET | Freq: Every day | ORAL | Status: DC
  Administered 2017-07-21: 40 mg via ORAL
  Filled 2017-07-21: qty 1

## 2017-07-21 MED ORDER — AMIODARONE HCL 400 MG PO TABS: 400.0000 mg | ORAL_TABLET | Freq: Every day | ORAL | Status: DC

## 2017-07-21 MED ORDER — METOPROLOL TARTRATE 25 MG PO TABS: 25.0000 mg | ORAL_TABLET | Freq: Two times a day (BID) | ORAL | Status: AC

## 2017-07-21 MED ORDER — DIPHENOXYLATE-ATROPINE 2.5-0.025 MG PO TABS: 1.0000 | ORAL_TABLET | Freq: Four times a day (QID) | ORAL | 0 refills | Status: DC | PRN

## 2017-07-21 MED ORDER — ASPIRIN 81 MG PO CHEW: 81.0000 mg | CHEWABLE_TABLET | Freq: Every day | ORAL | Status: AC

## 2017-07-21 MED ORDER — GABAPENTIN 300 MG PO CAPS: 900.0000 mg | ORAL_CAPSULE | Freq: Every day | ORAL | Status: DC

## 2017-07-21 MED ORDER — HYDROCODONE-ACETAMINOPHEN 10-325 MG PO TABS: 1.0000 | ORAL_TABLET | Freq: Four times a day (QID) | ORAL | 0 refills | Status: DC | PRN

## 2017-07-21 MED ORDER — ZINC OXIDE 40 % EX OINT: TOPICAL_OINTMENT | CUTANEOUS | 0 refills | Status: AC | PRN

## 2017-07-21 MED ORDER — PANTOPRAZOLE SODIUM 40 MG PO TBEC: 40.0000 mg | DELAYED_RELEASE_TABLET | Freq: Every day | ORAL | Status: AC

## 2017-07-21 NOTE — Progress Notes (Signed)
Physical Therapy Treatment Patient Details Name: Jennifer Delgado MRN: [id number] DOB: [birth year] Today's Date: 07/21/2017    History of Present Illness 67 y/o female with stage IV NSCLC who had a cardiac arrest on 4/1 from VF in the setting of hyponatremia and pneumonia    PT Comments    Pt seen for standing/partial stand tolerance and bed mobility this pm; overall pt is improving, increasing tolerance to activity and incr standing tolerance in VitalGo bed with incr WBing through LEs; pt is requiring less assist to roll; see below for details; pt is very cooperative with PT; continue to recommend SNF level therapies at d/c  Follow Up Recommendations  SNF;Supervision/Assistance - 24 hour     Equipment Recommendations  None recommended by PT    Recommendations for Other Services       Precautions / Restrictions Precautions Precautions: Fall Precaution Comments: 2 falls in past 1 year Restrictions Weight Bearing Restrictions: No    Mobility  Bed Mobility Overal bed mobility: Needs Assistance Bed Mobility: Rolling Rolling: Mod assist;+2 for safety/equipment   Supine to sit: Mod assist;+2 for safety/equipment     General bed mobility comments: assist to roll R and L x2 for bed change d/t incontinence of stool; multi-modal cues for sequence and self assist, hand over hand at times to facilitate movement adn position UEs/LEs  Transfers Overall transfer level: Needs assistance   Transfers: (supine to stand)           General transfer comment: VitalGo bed used for supine to stand transition to facilitate upright posture,  incr standing tolerance, heel cord stretch;  Pt tolerated tilt to 35* for 56min, 45* for 90min, 55* for 74min, and 60* for  5-6 minutes   Ambulation/Gait                 Stairs             Wheelchair Mobility    Modified Rankin (Stroke Patients Only)       Balance             Standing balance-Leahy Scale: Zero Standing balance  comment: partial stand in VitalGo bed for ~ 44minutes                            Cognition Arousal/Alertness: Awake/alert Behavior During Therapy: [facility] for tasks assessed/performed;Flat affect Overall Cognitive Status: Within Functional Limits for tasks assessed                               Problem Solving: Decreased initiation;Difficulty sequencing;Requires verbal cues;Requires tactile cues General Comments: appears WFL, follows one step commands with incr time, very little verbalization but responds to cues and questions;       Exercises General Exercises - Lower Extremity Mini-Sqauts: AROM;Strengthening;Both;Standing(7 reps, supported squats with ~40-50# wt supported thru LEs)    General Comments General comments (skin integrity, edema, etc.): pt with L lateral bias, initiates correction with cues and faciltiation      Pertinent Vitals/Pain Pain Assessment: No/denies pain    Home Living                      Prior Function            PT Goals (current goals can now be found in the care plan section) Acute Rehab PT Goals Patient Stated Goal: to get strength back PT Goal Formulation:  With patient/family Time For Goal Achievement: 07/30/17 Potential to Achieve Goals: Fair Progress towards PT goals: Progressing toward goals    Frequency    Min 3X/week      PT Plan Current plan remains appropriate    Co-evaluation              AM-PAC PT "6 Clicks" Daily Activity  Outcome Measure  Difficulty turning over in bed (including adjusting bedclothes, sheets and blankets)?: Unable Difficulty moving from lying on back to sitting on the side of the bed? : Unable Difficulty sitting down on and standing up from a chair with arms (e.g., wheelchair, bedside commode, etc,.)?: Unable Help needed moving to and from a bed to chair (including a wheelchair)?: Total Help needed walking in hospital room?: Total Help needed climbing 3-5 steps with  a railing? : Total 6 Click Score: 6    End of Session Equipment Utilized During Treatment: Gait belt Activity Tolerance: Patient tolerated treatment well Patient left: in bed;with call bell/phone within reach;with family/visitor present   PT Visit Diagnosis: Difficulty in walking, not elsewhere classified (R26.2);Other symptoms and signs involving the nervous system (R29.898);Muscle weakness (generalized) (M62.81);Other abnormalities of gait and mobility (R26.89)     Time: 5035-4656 PT Time Calculation (min) (ACUTE ONLY): 40 min  Charges:  $Therapeutic Activity: 38-52 mins                    G CodesKenyon Ana, PT Pager: [phone] 07/21/2017    Kenyon Ana 07/21/2017, 2:18 PM

## 2017-07-21 NOTE — Discharge Summary (Addendum)
Physician Discharge Summary  Dionicia Cerritos  WUX:[id number]  DOB: [birth year]  DOA: 06/29/2017 PCP: Patient, No Pcp Per  Admit date: 06/29/2017 Discharge date: 07/21/2017  Admitted From: Home  Disposition:  SNF   Recommendations for Outpatient Follow-up:  1. Follow up with SNF provider at earliest convenience 2. Will need PT/OT and speech therapy 3. Please obtain BMP/CBC in one week to monitor renal function and hemoglobin 4. Monitor CBG's with meal and at bedtime patient with poor oral intake  5. Check sodium in 1 week 6. Cardiology follow-up in 1-2 weeks 7. Oncology follow-up at [facility]  Discharge Condition: Stable CODE STATUS: Full code Diet recommendation:  Dysphagia 3  Brief/Interim Summary: For full details see H&P/Progress note, but in brief, Sharnelle Cappelli is a 67 y.o.femalepast medical history of non-small cell stage IV carcinoma, HTN and obesity  who was admitted on 3/29 with hyponatremia and possible pneumonia on 4/1, she developed V. fib arrest with STEMI she was intubated, resuscitated and transferred to ICU extubated on 07/16/2017.  Patient completed antibiotic therapy for pneumonia. Oncology was consulted for non-small cell stage IV carcinoma, who recommended to start oral chemotherapy.  During hospital stay patient developed acute renal failure due to volume overload and patient responded well to diuresis bringing down creatinine to baseline.  Clinical course has been complicated with dysphagia with nausea and vomiting.  Swallow evaluation was performed which showed risk for aspiration.  Diet being advanced slowly.  PT evaluated patient and recommending SNF.  Subjective: Patient seen and examined, she has no complaints today.  Denies abdominal pain, nausea and vomiting.  Patient remains afebrile.  No acute events overnight.   Discharge Diagnoses/Hospital Course:  Acute on chronic respiratory failure with hypoxia Multifactorial from HCAP, COPD exacerbation and heart  failure. Patient was diuresed, she is continued on Lasix once a day. Patient completed antibiotic regimen during hospital stay, no signs of infection at this time.  Afebrile Oxygen was attempted to weaned off but she remained in 2L [place] with O2 Sats > 91%, can wean as outpatient as tolerated.   Status post cardiac arrest in setting of V. fib with STEMI CPR resuscitation on 4/1, patient was started on metoprolol and amiodarone. Per cardiology team not candidate for AICD or invasive procedures, continue to treat medically Continue statin  Cardiology follow-up in 1-2 weeks  HCAP - Resolved  Completed antibiotic therapy for 10 days she was treated with Rocephin and Zosyn.  Acute metabolic encephalopathy - resolved Due to hyponatremia and infectious process now has resolved. Patient back to baseline mentally  Acute kidney injury/hyponatremia - resolved Creatinine is back to baseline she was treated with IV  diuresis as this was felt to be secondary to volume overload.  Essential hypertension BP now stable  Continue, Imdur, Lisinopril and metoprolol   Non-small cell lung cancer with metastasis to brain  Oncology was consulted and recommended to start oral chemotherapy Lorlatinib for 10 days. Follow up with oncology as outpatient.   Severe dysphagia/vocal cord weakness Sequela of post intubation Had modified barium studies, no indication of airway compromise, so no signs of mild aspiration.  Patient was placed on dysphagia 3 diet. Speech therapy as outpatient.   Abdominal pain - resolved Abdominal x-ray does not show any signs of obstruction or ileus  Leukoplakia No signs of oral candidiasis Follow-up as an outpatient.  All other chronic medical condition were stable during the hospitalization.  Patient was seen by physical therapy, recommending SNF On the day of the discharge the patient's  vitals were stable, and no other acute medical condition were reported by patient. the  patient was felt safe to be discharge to SNF  Discharge Instructions  You were cared for by a hospitalist during your hospital stay. If you have any questions about your discharge medications or the care you received while you were in the hospital after you are discharged, you can call the unit and asked to speak with the hospitalist on call if the hospitalist that took care of you is not available. Once you are discharged, your primary care physician will handle any further medical issues. Please note that NO REFILLS for any discharge medications will be authorized once you are discharged, as it is imperative that you return to your primary care physician (or establish a relationship with a primary care physician if you do not have one) for your aftercare needs so that they can reassess your need for medications and monitor your lab values.  Discharge Instructions    Call MD for:  difficulty breathing, headache or visual disturbances   Complete by:  As directed    Call MD for:  extreme fatigue   Complete by:  As directed    Call MD for:  hives   Complete by:  As directed    Call MD for:  persistant dizziness or light-headedness   Complete by:  As directed    Call MD for:  persistant nausea and vomiting   Complete by:  As directed    Call MD for:  redness, tenderness, or signs of infection (pain, swelling, redness, odor or green/yellow discharge around incision site)   Complete by:  As directed    Call MD for:  severe uncontrolled pain   Complete by:  As directed    Call MD for:  temperature >100.4   Complete by:  As directed    Diet - low sodium heart healthy   Complete by:  As directed    Mechanical soft diet   Increase activity slowly   Complete by:  As directed      Allergies as of 07/21/2017      Reactions   Daptomycin Other (See Comments)   Rhyabdomyolosis   Dilaudid [hydromorphone] Nausea And Vomiting      Medication List    STOP taking these medications   LORazepam 0.5  MG tablet Commonly known as:  ATIVAN   prochlorperazine 10 MG tablet Commonly known as:  COMPAZINE     TAKE these medications   amiodarone 400 MG tablet Commonly known as:  PACERONE Take 1 tablet (400 mg total) by mouth daily.   aspirin 81 MG chewable tablet Chew 1 tablet (81 mg total) by mouth daily.   atorvastatin 40 MG tablet Commonly known as:  LIPITOR Take 1 tablet (40 mg total) by mouth daily at 6 PM.   cetirizine 10 MG tablet Commonly known as:  ZYRTEC Take 10 mg by mouth at bedtime.   diclofenac sodium 1 % Gel Commonly known as:  VOLTAREN Apply 2 g topically 4 (four) times daily as needed (For pain.).   diphenoxylate-atropine 2.5-0.025 MG tablet Commonly known as:  LOMOTIL Take 1 tablet by mouth 4 (four) times daily as needed for diarrhea or loose stools.   furosemide 20 MG tablet Commonly known as:  LASIX Take 20 mg by mouth daily.   gabapentin 300 MG capsule Commonly known as:  NEURONTIN Take 900 mg by mouth at bedtime.   HYDROcodone-acetaminophen 10-325 MG tablet Commonly known as:  NORCO Take  1 tablet by mouth every 6 (six) hours as needed for up to 5 days (For pain.).   ibuprofen 200 MG tablet Commonly known as:  ADVIL,MOTRIN Take 800 mg by mouth every 6 (six) hours as needed for headache.   isosorbide mononitrate 30 MG 24 hr tablet Commonly known as:  IMDUR Take 30 mg by mouth daily.   lisinopril 10 MG tablet Commonly known as:  PRINIVIL,ZESTRIL Take 10 mg by mouth daily.   liver oil-zinc oxide 40 % ointment Commonly known as:  DESITIN Apply topically as needed for irritation.   Lorlatinib 100 MG Tabs Take 100 mg by mouth daily.   metoprolol tartrate 25 MG tablet Commonly known as:  LOPRESSOR Take 1 tablet (25 mg total) by mouth 2 (two) times daily.   pantoprazole 40 MG tablet Commonly known as:  PROTONIX Take 1 tablet (40 mg total) by mouth daily. What changed:    medication strength  how much to take  when to take this    potassium chloride SA 20 MEQ tablet Commonly known as:  K-DUR,KLOR-CON Take 20 mEq by mouth daily.   PROAIR HFA 108 (90 Base) MCG/ACT inhaler Generic drug:  albuterol Inhale 2 puffs into the lungs every 6 (six) hours as needed for wheezing or shortness of breath.   sertraline 100 MG tablet Commonly known as:  ZOLOFT Take 1 tablet (100 mg total) by mouth daily. What changed:  how much to take   triamcinolone cream 0.1 % Commonly known as:  KENALOG Apply 1 application topically 2 (two) times daily as needed (For rash after chemo.).       Contact information for follow-up providers    Croitoru, Mihai, MD. Schedule an appointment as soon as possible for a visit in 2 week(s).   Specialty:  Cardiology Why:  Hospital follow-up in 2-3 weeks Contact information: [address] [place] [address] [phone]            Contact information for after-discharge care    [facility] .   Service:  Skilled Nursing Contact information: [address] [place] [address] [phone]                 Allergies  Allergen Reactions  . Daptomycin Other (See Comments)    Rhyabdomyolosis  . Dilaudid [Hydromorphone] Nausea And Vomiting    Consultations:  Cardiology   Oncology   PCCM    Procedures/Studies: Dg Chest 1 View  Result Date: 07/03/2017 CLINICAL DATA:  67 year old female with central line placement. EXAM: ABDOMEN - 1 VIEW; CHEST  1 VIEW COMPARISON:  Chest radiograph dated 07/03/2017 FINDINGS: Endotracheal tube above the carina in similar position. Enteric tube extends into the stomach with tip likely located in the distal stomach. There has been interval placement of a left IJ central venous line with tip over central SVC at the level of the right hilum. No pneumothorax. Cardiomegaly with interstitial edema with more confluent airspace opacity in the right lung most likely representing  asymmetric pulmonary edema although pneumonia is not excluded. Clinical correlation is recommended. Overall interval worsening of the airspace disease compared to the prior radiograph. Probable small right pleural effusion. No acute osseous pathology. IMPRESSION: 1. Interval placement of a left IJ central line with tip over central SVC. No pneumothorax. Endotracheal tube above the carina and enteric tube likely in the distal stomach. 2. Cardiomegaly with findings of CHF. Pneumonia is not excluded. Clinical correlation is  recommended. Electronically Signed   By: Anner Crete M.D.   On: 07/03/2017 05:26   Dg Abd 1 View  Result Date: 07/06/2017 CLINICAL DATA:  67 year old female status post OG tube placement. EXAM: ABDOMEN - 1 VIEW COMPARISON:  07/03/2017.  Portable chest 0456 hours today. FINDINGS: Portable AP supine view at 1110 hours. Enteric tube courses to the left upper quadrant, tip at the level of the mid stomach, and the side hole projects at the level of the proximal gastric body. Paucity of bowel gas, nonobstructed gas pattern. Stable lung bases. No acute osseous abnormality identified. IMPRESSION: Enteric tube within the stomach, side hole at the level of the proximal gastric body. Electronically Signed   By: Genevie Ann M.D.   On: 07/06/2017 11:58   Dg Abd 1 View  Result Date: 07/03/2017 CLINICAL DATA:  67 year old female with central line placement. EXAM: ABDOMEN - 1 VIEW; CHEST  1 VIEW COMPARISON:  Chest radiograph dated 07/03/2017 FINDINGS: Endotracheal tube above the carina in similar position. Enteric tube extends into the stomach with tip likely located in the distal stomach. There has been interval placement of a left IJ central venous line with tip over central SVC at the level of the right hilum. No pneumothorax. Cardiomegaly with interstitial edema with more confluent airspace opacity in the right lung most likely representing asymmetric pulmonary edema although pneumonia is not excluded.  Clinical correlation is recommended. Overall interval worsening of the airspace disease compared to the prior radiograph. Probable small right pleural effusion. No acute osseous pathology. IMPRESSION: 1. Interval placement of a left IJ central line with tip over central SVC. No pneumothorax. Endotracheal tube above the carina and enteric tube likely in the distal stomach. 2. Cardiomegaly with findings of CHF. Pneumonia is not excluded. Clinical correlation is recommended. Electronically Signed   By: Anner Crete M.D.   On: 07/03/2017 05:26   Ct Head Wo Contrast  Result Date: 07/03/2017 CLINICAL DATA:  67 y/o F; history of metastatic non-small cell cancer with brain metastasis. Cardiac arrest. EXAM: CT HEAD WITHOUT CONTRAST TECHNIQUE: Contiguous axial images were obtained from the base of the skull through the vertex without intravenous contrast. COMPARISON:  12/31/2017 CT of the head. FINDINGS: Brain: New small areas of cortical hypoattenuation in the right parietal lobe (series 2: Image 21), right occipital lobe 2:15, left posterior temporal lobe 2:14, right frontal lobe 2:13, and bilateral cerebellar hemispheres 2:9 and 8. Cortical and basal ganglia gray-white differentiation is maintained at this time. No hemorrhage or focal mass effect. Stable small lucencies in the left frontal lobe. No extra-axial collection, hydrocephalus, or effacement of basilar cisterns. Stable mild brain parenchymal volume loss. Vascular: Calcific atherosclerosis of carotid siphons. No hyperdense vessel. Skull: Normal. Negative for fracture or focal lesion. Sinuses/Orbits: No acute finding. Other: None. IMPRESSION: 1. Several new small areas of hypoattenuation are present in right frontal, right parietal, right occipital, left posterior temporal lobes as well as bilateral cerebellar hemispheres, probably representing interval infarctions. Multiple vascular territories suggests embolic phenomenon. 2. Stable lucency in left frontal  lobe probably representing chronic microvascular ischemic change, possibly treated metastasis. These results will be called to the ordering clinician or representative by the Radiologist Assistant, and communication documented in the PACS or zVision Dashboard. Electronically Signed   By: Kristine Garbe M.D.   On: 07/03/2017 18:00   Ct Head Wo Contrast  Result Date: 06/30/2017 CLINICAL DATA:  67 y/o F; lung cancer with liver and brain metastasis. Post chemotherapy and [employer]. Five days  of headaches. EXAM: CT HEAD WITHOUT CONTRAST TECHNIQUE: Contiguous axial images were obtained from the base of the skull through the vertex without intravenous contrast. COMPARISON:  None. FINDINGS: Brain: No evidence of acute infarction, hemorrhage, hydrocephalus, extra-axial collection or focal mass effect. Small foci of lucency are present in white matter and in the left frontal cortex without mass effect. Mild diffuse brain parenchymal volume loss. Vascular: Calcific atherosclerosis of carotid siphons. No hyperdense vessel. Skull: Normal. Negative for fracture or focal lesion. Sinuses/Orbits: No acute finding. Other: None. IMPRESSION: 1. No acute intracranial abnormality.  No focal mass effect. 2. Several small areas of lucency in white matter in the left frontal cortex may represent chronic microvascular ischemic changes or possibly treated metastasis. MRI of the brain with and without contrast can better characterize if clinically indicated. 3. Mild brain parenchymal volume loss. Electronically Signed   By: Kristine Garbe M.D.   On: 06/30/2017 02:42   Ct Chest Wo Contrast  Result Date: 07/03/2017 CLINICAL DATA:  Metastatic non-small cell lung cancer with metastasis to brain, hospitalized with hyponatremia, nausea, and vomiting, underwent cardiac arrest with CPR EXAM: CT CHEST WITHOUT CONTRAST TECHNIQUE: Multidetector CT imaging of the chest was performed following the standard protocol without IV  contrast. Sagittal and coronal MPR images reconstructed from axial data set. Extensive beam hardening artifacts from inclusion of patient's arms in imaged field. COMPARISON:  None FINDINGS: Cardiovascular: Aorta normal caliber. Minimal pericardial effusion. Heart otherwise unremarkable. LEFT jugular line with tip in SVC. Mediastinum/Nodes: Tip of endotracheal tube above carina. Nasogastric tube coiled in stomach. Esophagus unremarkable. No definite thoracic adenopathy though hilar assessment is limited by the lack of IV contrast. Base of cervical region grossly unremarkable. Lungs/Pleura: Extensive BILATERAL pulmonary infiltrates greatest in LEFT lower lobe. Small to moderate RIGHT pleural effusion. Unable to exclude underlying mass lesions due to the severity of observed infiltrates. Upper Abdomen: Visualized upper abdomen unremarkable Musculoskeletal: BILATERAL anterior rib fractures which may related to CPR. Bones demineralized. Mid sternal fracture. IMPRESSION: Extensive BILATERAL pulmonary infiltrates which could be related to infection or aspiration. Small to moderate low-attenuation RIGHT pleural effusion. BILATERAL rib and sternal fractures question related to CPR/resuscitation. Small pericardial effusion. Electronically Signed   By: Lavonia Dana M.D.   On: 07/03/2017 18:01   Mr Jeri Cos EG Contrast  Result Date: 07/12/2017 CLINICAL DATA:  Follow-up stroke. History of metastatic lung cancer and recent cardiac arrest. EXAM: MRI HEAD WITHOUT AND WITH CONTRAST TECHNIQUE: Multiplanar, multiecho pulse sequences of the brain and surrounding structures were obtained without and with intravenous contrast. CONTRAST:  11mL MULTIHANCE GADOBENATE DIMEGLUMINE 529 MG/ML IV SOLN COMPARISON:  CT HEAD July 03, 2017. FINDINGS: INTRACRANIAL CONTENTS: Patchy reduced diffusion bilateral frontoparietal lobes and LEFT greater than RIGHT occipital lobes with nearly normalized ADC values, wispy enhancement and FLAIR T2  hyperintense signal. In addition, multiple less than 5 mm solidly enhancing and slight subcentimeter rim enhancing nodules numbering at least 15 in the supra-and infratentorial brain predominately at gray-white matter junction. No susceptibility artifact to suggest hemorrhage. No midline shift or mass effect. Additional. Very minimal suspected chronic small vessel ischemic disease. Mild parenchymal brain volume loss without hydrocephalus. No abnormal extra-axial fluid collections or masses. VASCULAR: Normal major intracranial vascular flow voids present at skull base. SKULL AND UPPER CERVICAL SPINE: No abnormal sellar expansion. Heterogeneous calvarial bone marrow signal without focal signal abnormality or enhancement. Craniocervical junction maintained. SINUSES/ORBITS: Trace paranasal sinus mucosal thickening. Hypoplastic bilateral mastoid air cells with effusion. Nasal septum perforation. The included  ocular globes and orbital contents are non-suspicious. OTHER: None. IMPRESSION: 1. Numerous supra- and infratentorial early subacute mildly enhancing infarcts attributable to hypoperfusion/cardiac arrest, though also seen with embolic phenomena. 2. Numerous small supra- and infratentorial metastasis. 3. Mild parenchymal brain volume loss. Electronically Signed   By: Elon Alas M.D.   On: 07/12/2017 15:18   Dg Chest Port 1 View  Result Date: 07/12/2017 CLINICAL DATA:  Respiratory failure EXAM: PORTABLE CHEST 1 VIEW COMPARISON:  07/11/2017 FINDINGS: Cardiac shadow is stable. Endotracheal tube, nasogastric catheter and left jugular central line are again seen and stable. Persistent right-sided pleural effusion with basilar atelectasis. Is seen. Left basilar atelectasis has increased in the interval. IMPRESSION: Increasing changes in the left base. Electronically Signed   By: Inez Catalina M.D.   On: 07/12/2017 07:44   Dg Chest Port 1 View  Result Date: 07/11/2017 CLINICAL DATA:  Respiratory failure. EXAM:  PORTABLE CHEST 1 VIEW COMPARISON:  07/10/2017. FINDINGS: Endotracheal tube and orogastric tube in stable position. Left IJ line stable position. Left IJ line Skene at the insertion site. Heart size stable. Persistent but improved bilateral pulmonary infiltrates/edema. Persistent but improved small right pleural effusion. No pneumothorax. IMPRESSION: 1.  Lines and tubes in stable position. 2. Persistent but improved bilateral pulmonary infiltrates/edema. Persistent but improved small right-sided pleural effusion Electronically Signed   By: Marcello Moores  Register   On: 07/11/2017 07:24   Dg Chest Port 1 View  Result Date: 07/10/2017 CLINICAL DATA:  Respiratory failure EXAM: PORTABLE CHEST 1 VIEW COMPARISON:  Yesterday FINDINGS: Endotracheal tube tip at the clavicular heads. An orogastric tube reaches the stomach at least. Left IJ catheter, tip at the SVC origin. Diffuse airspace opacity with layering pleural fluid on the right. Chronic cardiomegaly. No visible pneumothorax. No convincing improvement. IMPRESSION: 1. Stable positioning of hardware. 2. Extensive airspace disease with layering pleural fluid on the right. No interval change. Electronically Signed   By: Monte Fantasia M.D.   On: 07/10/2017 07:08   Dg Chest Port 1 View  Result Date: 07/09/2017 CLINICAL DATA:  Respiratory failure EXAM: PORTABLE CHEST 1 VIEW COMPARISON:  07/08/2017 FINDINGS: Endotracheal tube in good position. NG tube enters the stomach with the tip not visualized. Left jugular central venous catheter tip in the SVC. No pneumothorax Severe diffuse bilateral airspace disease shows mild progression. Bilateral pleural effusions. IMPRESSION: Endotracheal tube remains in good position Severe diffuse bilateral airspace disease with mild progression. Electronically Signed   By: Franchot Gallo M.D.   On: 07/09/2017 08:11   Dg Chest Port 1 View  Result Date: 07/08/2017 CLINICAL DATA:  Pneumonia EXAM: PORTABLE CHEST 1 VIEW COMPARISON:  07/07/2017  FINDINGS: Endotracheal tube in good position. Left jugular central venous catheter tip in the SVC unchanged in position. NG tube enters the stomach. Severe diffuse bilateral airspace disease with progression. Mild to moderate right pleural effusion unchanged. Bibasilar volume loss right greater than left unchanged IMPRESSION: Support lines remain in satisfactory position Severe bilateral airspace disease with progression. Electronically Signed   By: Franchot Gallo M.D.   On: 07/08/2017 07:13   Dg Chest Port 1 View  Result Date: 07/07/2017 CLINICAL DATA:  Hypoxia EXAM: PORTABLE CHEST 1 VIEW COMPARISON:  July 06, 2017 FINDINGS: Endotracheal tube tip is 3.9 cm above the carina. Left jugular catheter tip is at the junction of the left innominate vein and superior vena cava. Nasogastric tube tip and side port below the diaphragm. No pneumothorax. There are pleural effusions bilaterally, larger on the right than on  the left. There is moderate interstitial and patchy alveolar edema, stable. There is cardiomegaly with pulmonary venous hypertension. No evident adenopathy. No bone lesions. IMPRESSION: Tube and catheter positions as described without pneumothorax. Pulmonary vascular congestion with pleural effusions bilaterally and diffuse pulmonary edema. Suspect underlying congestive heart failure. Cannot exclude superimposed pneumonia in areas of alveolar opacity. There may well be pneumonia and congestive heart failure present concurrently. Electronically Signed   By: Lowella Grip III M.D.   On: 07/07/2017 07:32   Dg Chest Port 1 View  Result Date: 07/06/2017 CLINICAL DATA:  Respiratory failure EXAM: PORTABLE CHEST 1 VIEW COMPARISON:  07/05/2017 FINDINGS: Endotracheal tube in good position. Left jugular catheter tip in the SVC unchanged. NG in the stomach. Diffuse bilateral airspace disease unchanged. Progression of right lower lobe atelectasis/infiltrate. No change in left lower lobe atelectasis. IMPRESSION:  Diffuse bilateral airspace disease unchanged most likely edema Bibasilar atelectasis. Progression of right lower lobe atelectasis and right effusion. Electronically Signed   By: Franchot Gallo M.D.   On: 07/06/2017 07:47   Dg Chest Port 1 View  Result Date: 07/05/2017 CLINICAL DATA:  Respiratory failure. EXAM: PORTABLE CHEST 1 VIEW COMPARISON:  One-view chest x-ray 07/04/2017 FINDINGS: Heart is enlarged. Endotracheal tube left IJ line and NG tube are stable. Interstitial edema and bilateral effusions are unchanged. Bibasilar airspace disease likely reflects atelectasis. IMPRESSION: 1. Stable appearance of congestive heart failure. 2. Support apparatus is stable. Electronically Signed   By: San Morelle M.D.   On: 07/05/2017 08:58   Dg Chest Port 1 View  Result Date: 07/04/2017 CLINICAL DATA:  resp failure EXAM: PORTABLE CHEST 1 VIEW COMPARISON:  Chest radiograph and CT 07/03/2017. FINDINGS: ET tube 4.5 cm above carina. BILATERAL pulmonary opacities are improved. Significant residual lower lobe opacities remain. Orogastric tube tip not visualized. No pneumothorax. Rib fractures better demonstrated on CT. IMPRESSION: Improved aeration. Electronically Signed   By: Staci Righter M.D.   On: 07/04/2017 07:03   Dg Chest Port 1 View  Result Date: 07/03/2017 CLINICAL DATA:  Endotracheal tube placement. Dyspnea and respiratory failure. EXAM: PORTABLE CHEST 1 VIEW COMPARISON:  None. FINDINGS: Portable AP supine view of the chest. Cardiomegaly with interstitial edema. Slightly more confluent airspace opacities in the right upper and lower lung. Endotracheal tube tip is 4.2 cm above the carina in satisfactory position. External defibrillator paddles project over the cardiac silhouette. No acute osseous abnormality. IMPRESSION: 1. Endotracheal tube tip is satisfactory in position approximately 4.2 cm above the carina. 2. Cardiomegaly with interstitial edema. Slightly more confluent airspace opacity in the right  upper and lower. Superimposed CHF or pneumonia may contribute to this appearance. Electronically Signed   By: Ashley Royalty M.D.   On: 07/03/2017 03:19   Dg Abd 2 Views  Result Date: 07/20/2017 CLINICAL DATA:  Abdominal pain EXAM: ABDOMEN - 2 VIEW COMPARISON:  Abdominal x-rays dated 07/06/2017 and 07/03/2017. FINDINGS: On today's exam, the overall bowel gas pattern is nonobstructive. Contrast from previous fluoroscopic swallow examination is seen in the colon and rectum. No evidence of soft tissue mass or abnormal fluid collection. No evidence of free intraperitoneal air. No acute or suspicious osseous finding. IMPRESSION: No acute findings.  Nonobstructive bowel gas pattern. Electronically Signed   By: Franki Cabot M.D.   On: 07/20/2017 13:43   Dg Swallowing Func-speech Pathology  Result Date: 07/20/2017 Objective Swallowing Evaluation: Type of Study: MBS-Modified Barium Swallow Study  Patient Details Name: Manjit Bufano MRN: [id number] Date of Birth: 1951/03/18 Today's Date: 07/20/2017  Time: SLP Start Time (ACUTE ONLY): 0630 -SLP Stop Time (ACUTE ONLY): 1440 SLP Time Calculation (min) (ACUTE ONLY): 25 min Past Medical History: Past Medical History: Diagnosis Date . Brain cancer ([place])  . Cancer ([place])   lung . Cellulitis  . Hypertension  . Liver cancer ([place])  . Renal disorder  Past Surgical History: Past Surgical History: Procedure Laterality Date . BRONCHOSCOPY   . LEG SURGERY   . THORACENTESIS   HPI: 67 y.o.femalewith a hx of pulmonary edema after second chemo, with normal Echo, Metastatic lung cancer-stage IV-metatases to brain, sternum and spineulcerated lipodermatosclerosis of LLE, HTN. Admitted with nausea and vomiting and hyponatremia. V-fib arrest 07/03/17 due to STEMI. Respiratory failure due to aspiration (?) pneumonia. note intractibile nausea vomiting prior to admit. Intubated from 4/1-4/12  Pt reports improvement with nausea today.   Subjective: pt awake in chair Assessment / Plan / Recommendation  CHL IP CLINICAL IMPRESSIONS 07/20/2017 Clinical Impression Limited amount of barium given due to pt's reported abdomen discomfort earlier today - DG abdomen negative for acute deficit prior to proceeding with MBS.  Pt presents with improved swallow function but continues with aspiration of secretions and thin liquids via cup/straw.   Trace penetration of liquids (nectar) cleared with cued throat clearing.   Aspiration continued due to decreased laryngeal closure and poor timing of swallow.  decreased tongue base retraction/oral propulsion.  Mild amount of pyriform sinus residuals present due to decreased laryngeal elevation.  Did not test solids/puree today.  Delayed reflexive cough with aspiration (cough x1 only after aspirates likely reached carina) was not effective to clear airway.   Recommend dys3/ground meats/nectar liquid with ongoing precautions.  Recommend pt be allowed thin liquids via [place] cup independently *releases 5 cc of liquids only.  Again if pt is coughing with po = she is ASPIRATING.  Recommend follow up SLP at [facility] for dysphagia management/treatment.  Of note, prior to provided pt with barium - SLP examined oral cavity - noted subtle white spots on soft palate - concerning for oral candidiasis. - Thanks for allowing me to help care for this pt.    SLP Visit Diagnosis Dysphagia, oropharyngeal phase (R13.12) Attention and concentration deficit following -- Frontal lobe and executive function deficit following -- Impact on safety and function Moderate aspiration risk   CHL IP TREATMENT RECOMMENDATION 07/20/2017 Treatment Recommendations Therapy as outlined in treatment plan below   Prognosis 07/20/2017 Prognosis for Safe Diet Advancement Good Barriers to Reach Goals -- Barriers/Prognosis Comment -- CHL IP DIET RECOMMENDATION 07/20/2017 SLP Diet Recommendations Other (Comment);Nectar thick liquid Liquid Administration via -- Medication Administration Crushed with puree Compensations Minimize  environmental distractions;Small sips/bites;Slow rate;Effortful swallow;Chin tuck;Multiple dry swallows after each bite/sip Postural Changes --   CHL IP OTHER RECOMMENDATIONS 07/20/2017 Recommended Consults -- Oral Care Recommendations Other (Comment);Oral care BID Other Recommendations Have oral suction available;Order thickener from pharmacy;Clarify dietary restrictions   CHL IP FOLLOW UP RECOMMENDATIONS 07/20/2017 Follow up Recommendations Skilled Nursing facility   [facility] IP FREQUENCY AND DURATION 07/20/2017 Speech Therapy Frequency (ACUTE ONLY) min 2x/week Treatment Duration 2 weeks      CHL IP ORAL PHASE 07/20/2017 Oral Phase Impaired Oral - Pudding Teaspoon -- Oral - Pudding Cup -- Oral - Honey Teaspoon -- Oral - Honey Cup -- Oral - Nectar Teaspoon WFL Oral - Nectar Cup Premature spillage Oral - Nectar Straw Premature spillage Oral - Thin Teaspoon Premature spillage Oral - Thin Cup Premature spillage Oral - Thin Straw Premature spillage Oral - Puree NT Oral -  Mech Soft NT Oral - Regular -- Oral - Multi-Consistency -- Oral - Pill NT Oral Phase - Comment --  CHL IP PHARYNGEAL PHASE 07/20/2017 Pharyngeal Phase Impaired Pharyngeal- Pudding Teaspoon -- Pharyngeal -- Pharyngeal- Pudding Cup -- Pharyngeal -- Pharyngeal- Honey Teaspoon -- Pharyngeal -- Pharyngeal- Honey Cup -- Pharyngeal -- Pharyngeal- Nectar Teaspoon Reduced laryngeal elevation;Pharyngeal residue - pyriform;Penetration/Aspiration during swallow;Reduced airway/laryngeal closure Pharyngeal Material enters airway, passes BELOW cords without attempt by patient to eject out (silent aspiration) Pharyngeal- Nectar Cup Pharyngeal residue - pyriform;Reduced laryngeal elevation;Reduced airway/laryngeal closure Pharyngeal -- Pharyngeal- Nectar Straw Pharyngeal residue - pyriform;Reduced laryngeal elevation;Reduced airway/laryngeal closure Pharyngeal -- Pharyngeal- Thin Teaspoon Reduced laryngeal elevation;Reduced airway/laryngeal closure;Penetration/Aspiration during  swallow Pharyngeal Material enters airway, passes BELOW cords without attempt by patient to eject out (silent aspiration) Pharyngeal- Thin Cup Penetration/Aspiration during swallow;Penetration/Apiration after swallow;Reduced laryngeal elevation;Reduced airway/laryngeal closure;Trace aspiration Pharyngeal Material enters airway, passes BELOW cords without attempt by patient to eject out (silent aspiration) Pharyngeal- Thin Straw Reduced laryngeal elevation;Reduced airway/laryngeal closure;Trace aspiration;Penetration/Aspiration during swallow Pharyngeal Material enters airway, passes BELOW cords without attempt by patient to eject out (silent aspiration) Pharyngeal- Puree NT Pharyngeal -- Pharyngeal- Mechanical Soft NT Pharyngeal -- Pharyngeal- Regular -- Pharyngeal -- Pharyngeal- Multi-consistency -- Pharyngeal -- Pharyngeal- Pill NT Pharyngeal -- Pharyngeal Comment throat clear improved laryngeal clearance of barium compared to cough, pt does not sense mild residuals mixed with secretions at pyriform sinus, aspiration of secretions noted (silent)  CHL IP CERVICAL ESOPHAGEAL PHASE 07/20/2017 Cervical Esophageal Phase Impaired Pudding Teaspoon -- Pudding Cup -- Honey Teaspoon -- Honey Cup -- Nectar Teaspoon -- Nectar Cup -- Nectar Straw -- Thin Teaspoon -- Thin Cup -- Thin Straw -- Puree -- Mechanical Soft -- Regular -- Multi-consistency -- Pill -- Cervical Esophageal Comment mild residuals at distal pharynx -pyriform sinus No flowsheet data found. Macario Golds 07/20/2017, 3:56 PM  Luanna Salk, [facility] SLP [phone]             Dg Swallowing Func-speech Pathology  Result Date: 07/17/2017 Objective Swallowing Evaluation: Type of Study: MBS-Modified Barium Swallow Study  Patient Details Name: Brittannie Tawney MRN: [id number] Date of Birth: 24-Aug-1950 Today's Date: 07/17/2017 Time: SLP Start Time (ACUTE ONLY): 1240 -SLP Stop Time (ACUTE ONLY): 1305 SLP Time Calculation (min) (ACUTE ONLY): 25 min Past Medical History:  Past Medical History: Diagnosis Date . Brain cancer ([place])  . Cancer ([place])   lung . Cellulitis  . Hypertension  . Liver cancer ([place])  . Renal disorder  Past Surgical History: Past Surgical History: Procedure Laterality Date . BRONCHOSCOPY   . LEG SURGERY   . THORACENTESIS   HPI: 67 y.o.femalewith a hx of pulmonary edema after second chemo, with normal Echo, Metastatic lung cancer-stage IV-metatases to brain, sternum and spineulcerated lipodermatosclerosis of LLE, HTN. Admitted with nausea and vomiting and hyponatremia. V-fib arrest 07/03/17 due to STEMI. Respiratory failure due to aspiration (?) pneumonia. note intractibile nausea vomiting prior to admit. Intubated from 4/1-4/12  Subjective: pt awake in chair Assessment / Plan / Recommendation CHL IP CLINICAL IMPRESSIONS 07/17/2017 Clinical Impression Pt presents with moderately severe oropharyngeal dysphagia with resultant aspiration of thin, nectar liquids due to decreased laryngeal closure and poor timing of swallow.  Chin tuck with tsps nectar prevented aspiration.  Vallecular residuals present due to decreased tongue base retraction/oral propulsion.  Pt with inconsistent awareness to vallecular residuals.  Reflexive delayed cough with aspiration was not effective to clear airway.   Barium tablet given with puree readily transited oropharynx to distal esophagus.  Recommend consider full liquid *nectar* diet with STRICT precautions (tsps with chin tuck) and full supervision.  If pt is coughing with po = she is ASPIRATING.  She will remain a risk due to her deconditioning and sensory deficits.  Hopeful for ongoing improvement as pt medically improves.  Recommended pt strengthen her cough/expectoration ability using teach back.  Dependent on her tolerance, she may benefit from repeat MBS.  Of note, pt asking if she can take her chemo pill today - advised her to speak to her MD.    SLP Visit Diagnosis Dysphagia, oropharyngeal phase (R13.12) Attention and  concentration deficit following -- Frontal lobe and executive function deficit following -- Impact on safety and function Moderate aspiration risk   CHL IP TREATMENT RECOMMENDATION 07/17/2017 Treatment Recommendations Therapy as outlined in treatment plan below   Prognosis 07/17/2017 Prognosis for Safe Diet Advancement Guarded Barriers to Reach Goals Severity of deficits;Time post onset Barriers/Prognosis Comment -- CHL IP DIET RECOMMENDATION 07/17/2017 SLP Diet Recommendations Other (Comment);Nectar thick liquid Liquid Administration via -- Medication Administration Crushed with puree Compensations Minimize environmental distractions;Small sips/bites;Slow rate;Effortful swallow;Chin tuck;Multiple dry swallows after each bite/sip Postural Changes Remain semi-upright after after feeds/meals (Comment);Seated upright at 90 degrees   CHL IP OTHER RECOMMENDATIONS 07/17/2017 Recommended Consults -- Oral Care Recommendations Other (Comment);Oral care BID Other Recommendations Have oral suction available;Order thickener from pharmacy;Clarify dietary restrictions   CHL IP FOLLOW UP RECOMMENDATIONS 07/17/2017 Follow up Recommendations Skilled Nursing facility   [facility] IP FREQUENCY AND DURATION 07/17/2017 Speech Therapy Frequency (ACUTE ONLY) min 2x/week Treatment Duration 2 weeks      CHL IP ORAL PHASE 07/17/2017 Oral Phase Impaired Oral - Pudding Teaspoon -- Oral - Pudding Cup -- Oral - Honey Teaspoon -- Oral - Honey Cup -- Oral - Nectar Teaspoon Decreased bolus cohesion;Delayed oral transit;Lingual/palatal residue;Reduced posterior propulsion;Weak lingual manipulation Oral - Nectar Cup Decreased bolus cohesion;Lingual/palatal residue;Reduced posterior propulsion;Weak lingual manipulation;Delayed oral transit Oral - Nectar Straw Decreased bolus cohesion;Delayed oral transit;Lingual/palatal residue;Reduced posterior propulsion;Weak lingual manipulation Oral - Thin Teaspoon Decreased bolus cohesion;Lingual/palatal residue;Reduced  posterior propulsion;Weak lingual manipulation;Delayed oral transit Oral - Thin Cup Decreased bolus cohesion;Lingual/palatal residue;Reduced posterior propulsion;Weak lingual manipulation;Delayed oral transit Oral - Thin Straw Decreased bolus cohesion;Lingual/palatal residue;Reduced posterior propulsion;Weak lingual manipulation;Delayed oral transit Oral - Puree Decreased bolus cohesion;Lingual/palatal residue;Reduced posterior propulsion;Weak lingual manipulation;Delayed oral transit Oral - Mech Soft Decreased bolus cohesion;Lingual/palatal residue;Reduced posterior propulsion;Weak lingual manipulation;Delayed oral transit Oral - Regular -- Oral - Multi-Consistency -- Oral - Pill Decreased bolus cohesion;Lingual/palatal residue;Reduced posterior propulsion;Weak lingual manipulation;Delayed oral transit Oral Phase - Comment --  CHL IP PHARYNGEAL PHASE 07/17/2017 Pharyngeal Phase Impaired Pharyngeal- Pudding Teaspoon -- Pharyngeal -- Pharyngeal- Pudding Cup -- Pharyngeal -- Pharyngeal- Honey Teaspoon -- Pharyngeal -- Pharyngeal- Honey Cup -- Pharyngeal -- Pharyngeal- Nectar Teaspoon Delayed swallow initiation-vallecula Pharyngeal -- Pharyngeal- Nectar Cup Delayed swallow initiation-vallecula Pharyngeal -- Pharyngeal- Nectar Straw Delayed swallow initiation-vallecula;Penetration/Aspiration during swallow Pharyngeal Material enters airway, passes BELOW cords without attempt by patient to eject out (silent aspiration) Pharyngeal- Thin Teaspoon Penetration/Aspiration before swallow;Penetration/Aspiration during swallow;Pharyngeal residue - valleculae Pharyngeal Material enters airway, passes BELOW cords and not ejected out despite cough attempt by patient Pharyngeal- Thin Cup Penetration/Aspiration during swallow;Penetration/Aspiration before swallow;Pharyngeal residue - valleculae Pharyngeal Material enters airway, passes BELOW cords and not ejected out despite cough attempt by patient Pharyngeal- Thin Straw Delayed  swallow initiation-vallecula;Reduced pharyngeal peristalsis;Pharyngeal residue - valleculae;Moderate aspiration Pharyngeal -- Pharyngeal- Puree Pharyngeal residue - valleculae;Reduced tongue base retraction;Reduced pharyngeal peristalsis;Reduced epiglottic inversion Pharyngeal -- Pharyngeal- Mechanical Soft  Reduced tongue base retraction;Reduced pharyngeal peristalsis;Pharyngeal residue - valleculae Pharyngeal -- Pharyngeal- Regular -- Pharyngeal -- Pharyngeal- Multi-consistency -- Pharyngeal -- Pharyngeal- Pill Delayed swallow initiation-vallecula;Reduced tongue base retraction;Pharyngeal residue - valleculae Pharyngeal -- Pharyngeal Comment pt's cough is weak and not protective, "Hock" and expectoration also weak, cued dry swallows decrease residuals, chin tuck and cued strong swallow helpful with nectar - but not thin, as testing progressed, pt with increased residuals/penetration - concerning for fatigue  CHL IP CERVICAL ESOPHAGEAL PHASE 07/17/2017 Cervical Esophageal Phase WFL Pudding Teaspoon -- Pudding Cup -- Honey Teaspoon -- Honey Cup -- Nectar Teaspoon -- Nectar Cup -- Nectar Straw -- Thin Teaspoon -- Thin Cup -- Thin Straw -- Puree -- Mechanical Soft -- Regular -- Multi-consistency -- Pill -- Cervical Esophageal Comment -- No flowsheet data found. Luanna Salk, [facility] SLP [phone]              [place] Ekg Site Rite  Result Date: 07/13/2017 If Site Rite image not attached, placement could not be confirmed due to current cardiac rhythm.   Discharge Exam: Vitals:   07/21/17 0439 07/21/17 1100  BP: (!) 127/41   Pulse: 75   Resp: 18   Temp: 98.6 F (37 C)   SpO2: 96% 94%   Vitals:   07/20/17 2012 07/20/17 2017 07/21/17 0439 07/21/17 1100  BP: (!) 141/55  (!) 127/41   Pulse: 68  75   Resp: 20  18   Temp: 97.8 F (36.6 C)  98.6 F (37 C)   TempSrc: Oral  Oral   SpO2:  96% 96% 94%  Weight:   90 kg (198 lb 6.6 oz)   Height:        General: NAD  Cardiovascular: RRR, S1/S2 +, no rubs, no  gallops Respiratory: CTA bilaterally, no wheezing, no rhonchi Abdominal: Obese, Soft, NT, ND, bowel sounds + Extremities: Trace LE edema   The results of significant diagnostics from this hospitalization (including imaging, microbiology, ancillary and laboratory) are listed below for reference.     Microbiology: Recent Results (from the past 240 hour(s))  C difficile quick scan w PCR reflex     Status: None   Collection Time: 07/19/17  6:00 PM  Result Value Ref Range Status   C Diff antigen NEGATIVE NEGATIVE Final   C Diff toxin NEGATIVE NEGATIVE Final   C Diff interpretation No C. difficile detected.  Final    Comment: Performed at [facility], [place] [address], [place], [place] [address]     Labs: BNP (last 3 results) No results for input(s): BNP in the last 8760 hours. Basic Metabolic Panel: Recent Labs  Lab 07/15/17 0436 07/16/17 0440 07/17/17 0935 07/18/17 0450 07/19/17 0515  NA 151* 146* 142 145 144  K 4.5 4.1 3.3* 3.4* 3.6  CL 112* 107 106 105 106  CO2 27 28 26 26 27   GLUCOSE 130* 133* 116* 105* 90  BUN 42* 46* 44* 39* 34*  CREATININE 0.78 0.82 0.80 0.93 0.85  CALCIUM 9.1 8.8* 8.7* 8.9 8.4*  MG  --   --  2.3  --   --    Liver Function Tests: Recent Labs  Lab 07/17/17 0409  AST 18  ALT 20  ALKPHOS 85  BILITOT 0.5  PROT 6.6  ALBUMIN 2.9*   No results for input(s): LIPASE, AMYLASE in the last 168 hours. No results for input(s): AMMONIA in the last 168 hours. CBC: Recent Labs  Lab 07/17/17 0409 07/18/17 0450 07/19/17 0515 07/20/17 0459  07/21/17 0530  WBC 12.2* 10.0 7.0 9.0 9.4  NEUTROABS 10.3* 8.6* 5.7 7.8* 7.6  HGB 8.4* 8.8* 8.3* 8.0* 8.0*  HCT 28.0* 29.0* 27.1* 26.1* 26.1*  MCV 98.9 97.6 96.8 96.7 95.3  PLT 341 308 258 259 255   Cardiac Enzymes: No results for input(s): CKTOTAL, CKMB, CKMBINDEX, TROPONINI in the last 168 hours. BNP: Invalid input(s): POCBNP CBG: Recent Labs  Lab 07/20/17 2140 07/21/17 0006  07/21/17 0433 07/21/17 0756 07/21/17 0838  GLUCAP 68 81 94 69 76   D-Dimer No results for input(s): DDIMER in the last 72 hours. Hgb A1c No results for input(s): HGBA1C in the last 72 hours. Lipid Profile No results for input(s): CHOL, HDL, LDLCALC, TRIG, CHOLHDL, LDLDIRECT in the last 72 hours. Thyroid function studies No results for input(s): TSH, T4TOTAL, T3FREE, THYROIDAB in the last 72 hours.  Invalid input(s): FREET3 Anemia work up No results for input(s): VITAMINB12, FOLATE, FERRITIN, TIBC, IRON, RETICCTPCT in the last 72 hours. Urinalysis    Component Value Date/Time   COLORURINE YELLOW 06/30/2017 0056   APPEARANCEUR CLEAR 06/30/2017 0056   LABSPEC 1.015 06/30/2017 0056   PHURINE 6.0 06/30/2017 0056   GLUCOSEU NEGATIVE 06/30/2017 0056   HGBUR MODERATE (A) 06/30/2017 0056   BILIRUBINUR NEGATIVE 06/30/2017 0056   KETONESUR 15 (A) 06/30/2017 0056   PROTEINUR NEGATIVE 06/30/2017 0056   NITRITE NEGATIVE 06/30/2017 0056   LEUKOCYTESUR TRACE (A) 06/30/2017 0056   Sepsis Labs Invalid input(s): PROCALCITONIN,  WBC,  LACTICIDVEN Microbiology Recent Results (from the past 240 hour(s))  C difficile quick scan w PCR reflex     Status: None   Collection Time: 07/19/17  6:00 PM  Result Value Ref Range Status   C Diff antigen NEGATIVE NEGATIVE Final   C Diff toxin NEGATIVE NEGATIVE Final   C Diff interpretation No C. difficile detected.  Final    Comment: Performed at [facility], [place] [address] [place], [place] [address]    Time coordinating discharge: 45 minutes  SIGNED:  Chipper Oman, MD  [facility] 07/21/2017, 11:41 AM  Pager please text page via  [email or web address]  Note - This record has been created using [employer]. Chart creation errors have been sought, but may not always have been located. Such creation errors do not reflect on the standard of medical care.

## 2017-07-21 NOTE — Progress Notes (Signed)
Pt discharging to admit to [facility]- report [phone].  (Pt was referred to SNFs in area 07/20/17 and accepted private bed at [facility]. DC summary sent via the HUB. Noted pt's primary insurance for SNF showed as BCBS and Medicare secondary rather than vice versa in [facility] system- as today is holiday and BCBS closed, facility accepted pt with 5 day LOG approved by CSW AD.)  CSW arranged PTAR transportation.   Pt's husband going to facility to complete paperwork. Also taking pt's oral chemo (lorlatinib) to facility as SNF cannot provide this medication.  Sharren Bridge, MSW, LCSW Clinical Social Work 07/21/2017 [phone]

## 2017-07-21 NOTE — Progress Notes (Signed)
Hypoglycemic Event  CBG: 59  Treatment: 15 GM carbohydrate snack, lunch tray ordered  Symptoms: None  Follow-up CBG: Time:1400 CBG Result:86  Possible Reasons for Event: Inadequate meal intake  Comments/MD notified: Dr. Quincy Simmonds notified    Othella Boyer [facility]

## 2017-07-21 NOTE — Progress Notes (Signed)
Occupational Therapy Treatment Patient Details Name: Jennifer Delgado MRN: [id number] DOB: July 28, 1950 Today's Date: 07/21/2017    History of present illness 67 y/o female with stage IV NSCLC who had a cardiac arrest on 4/1 from VF in the setting of hyponatremia and pneumonia   OT comments  Pt self fed about 10% of meal slowly, but fatiqued and did not take much more in with therapist feeding her.  May try to have her self feed near end of meal to see if intake improves.  Pt follows strategy for chin tuck consistently.  Cues for dry swallow and intermittent throat clearing. Would benefit from plate guard  Follow Up Recommendations  SNF    Equipment Recommendations  None recommended by OT    Recommendations for Other Services      Precautions / Restrictions Precautions Precautions: Fall Precaution Comments: 2 falls in past 1 year Restrictions Weight Bearing Restrictions: No       Mobility Bed Mobility                  Transfers                      Balance                                           ADL either performed or assessed with clinical judgement   ADL   Eating/Feeding: Bed level;Minimal assistance(for most of her intake)                                     General ADL Comments: pt self fed about 10% of her meal from bed, HOB 90. therapist blocked utensil so she could scoop food. Would benefit from a plate guard.  Tried built up foam on utensil, but she did better without this. Therapist fed pt about last 3 bites due to fatique then she stated she was full.  May need to feed her first and have her self feed at the end; unsure if her oral intake was less when she fed herself first     Vision       Perception     Praxis      Cognition Arousal/Alertness: Awake/alert Behavior During Therapy: WFL for tasks assessed/performed Overall Cognitive Status: Within Functional Limits for tasks assessed                                  General Comments: followed chin tuck without cues.  Cues for intermittent clearing of throat and dry swallow        Exercises     Shoulder Instructions       General Comments as pt fatiqued, tended to lean to L    Pertinent Vitals/ Pain       Pain Assessment: No/denies pain  Home Living                                          Prior Functioning/Environment              Frequency  Min 2X/week        Progress Toward Goals  OT Goals(current goals can now be found in the care plan section)  Progress towards OT goals: Progressing toward goals     Plan      Co-evaluation                 AM-PAC PT "6 Clicks" Daily Activity     Outcome Measure   Help from another person eating meals?: A Lot Help from another person taking care of personal grooming?: A Lot Help from another person toileting, which includes using toliet, bedpan, or urinal?: Total Help from another person bathing (including washing, rinsing, drying)?: Total Help from another person to put on and taking off regular upper body clothing?: Total Help from another person to put on and taking off regular lower body clothing?: Total 6 Click Score: 8    End of Session    OT Visit Diagnosis: Muscle weakness (generalized) (M62.81);Repeated falls (R29.6);History of falling (Z91.81);Unsteadiness on feet (R26.81);Other abnormalities of gait and mobility (R26.89)   Activity Tolerance Patient limited by fatigue   Patient Left in bed;with call bell/phone within reach   Nurse Communication (intake)        Time: 7322-0254 OT Time Calculation (min): 40 min  Charges: OT General Charges $OT Visit: 1 Visit OT Treatments $Self Care/Home Management : 38-52 mins  Jennifer Delgado, OTR/L [phone] 07/21/2017   Jennifer Delgado 07/21/2017, 9:36 AM

## 2017-07-21 NOTE — Progress Notes (Signed)
Agree with previous RN's assessment. [facility] at bedside to transport pt. All questions and concerns addressed. All belongings sent with pt.

## 2017-07-21 NOTE — Progress Notes (Signed)
Report called to Dedra Skeens, Therapist, sports at [facility]. Patient is stable. Latest CBG was 81. Awaiting [facility] transport.   Jennifer Delgado [facility] 07/21/2017  5:13 PM

## 2017-07-21 NOTE — Progress Notes (Signed)
Hypoglycemic Event  CBG: 69  Treatment: 15 GM carbohydrate snack  Symptoms: None  Follow-up CBG: [id number] CBG Result:76  Possible Reasons for Event: Inadequate meal intake  Comments/MD notified:    Othella Boyer [facility]

## 2017-07-22 LAB — GLUCOSE, CAPILLARY: Glucose-Capillary: 98 mg/dL (ref 65–99)

## 2017-07-24 ENCOUNTER — Emergency Department (HOSPITAL_COMMUNITY): Payer: BLUE CROSS/BLUE SHIELD

## 2017-07-24 ENCOUNTER — Non-Acute Institutional Stay: Payer: BLUE CROSS/BLUE SHIELD | Admitting: Adult Health

## 2017-07-24 ENCOUNTER — Other Ambulatory Visit: Payer: Self-pay

## 2017-07-24 ENCOUNTER — Inpatient Hospital Stay (HOSPITAL_COMMUNITY): Payer: BLUE CROSS/BLUE SHIELD

## 2017-07-24 ENCOUNTER — Inpatient Hospital Stay (HOSPITAL_COMMUNITY)
Admission: EM | Admit: 2017-07-24 | Discharge: 2017-08-03 | DRG: 871 | Disposition: A | Discharge status: Skilled Nursing Facility | Payer: BLUE CROSS/BLUE SHIELD | Attending: Internal Medicine | Admitting: Internal Medicine

## 2017-07-24 ENCOUNTER — Encounter: Payer: Self-pay | Admitting: Adult Health

## 2017-07-24 ENCOUNTER — Encounter (HOSPITAL_COMMUNITY): Payer: Self-pay | Admitting: *Deleted

## 2017-07-24 DIAGNOSIS — L97929 Non-pressure chronic ulcer of unspecified part of left lower leg with unspecified severity: Secondary | ICD-10-CM | POA: Diagnosis present

## 2017-07-24 DIAGNOSIS — K219 Gastro-esophageal reflux disease without esophagitis: Secondary | ICD-10-CM | POA: Diagnosis not present

## 2017-07-24 DIAGNOSIS — G893 Neoplasm related pain (acute) (chronic): Secondary | ICD-10-CM | POA: Diagnosis not present

## 2017-07-24 DIAGNOSIS — C3490 Malignant neoplasm of unspecified part of unspecified bronchus or lung: Secondary | ICD-10-CM | POA: Diagnosis not present

## 2017-07-24 DIAGNOSIS — I959 Hypotension, unspecified: Secondary | ICD-10-CM | POA: Diagnosis not present

## 2017-07-24 DIAGNOSIS — J9621 Acute and chronic respiratory failure with hypoxia: Secondary | ICD-10-CM | POA: Diagnosis not present

## 2017-07-24 DIAGNOSIS — R1312 Dysphagia, oropharyngeal phase: Secondary | ICD-10-CM | POA: Diagnosis present

## 2017-07-24 DIAGNOSIS — C787 Secondary malignant neoplasm of liver and intrahepatic bile duct: Secondary | ICD-10-CM | POA: Diagnosis present

## 2017-07-24 DIAGNOSIS — R490 Dysphonia: Secondary | ICD-10-CM | POA: Diagnosis present

## 2017-07-24 DIAGNOSIS — N179 Acute kidney failure, unspecified: Secondary | ICD-10-CM | POA: Diagnosis not present

## 2017-07-24 DIAGNOSIS — I4901 Ventricular fibrillation: Secondary | ICD-10-CM

## 2017-07-24 DIAGNOSIS — Z888 Allergy status to other drugs, medicaments and biological substances status: Secondary | ICD-10-CM

## 2017-07-24 DIAGNOSIS — M7989 Other specified soft tissue disorders: Secondary | ICD-10-CM | POA: Diagnosis not present

## 2017-07-24 DIAGNOSIS — C7931 Secondary malignant neoplasm of brain: Secondary | ICD-10-CM | POA: Diagnosis present

## 2017-07-24 DIAGNOSIS — R600 Localized edema: Secondary | ICD-10-CM | POA: Diagnosis not present

## 2017-07-24 DIAGNOSIS — D638 Anemia in other chronic diseases classified elsewhere: Secondary | ICD-10-CM | POA: Diagnosis present

## 2017-07-24 DIAGNOSIS — Z9981 Dependence on supplemental oxygen: Secondary | ICD-10-CM

## 2017-07-24 DIAGNOSIS — R571 Hypovolemic shock: Secondary | ICD-10-CM | POA: Diagnosis present

## 2017-07-24 DIAGNOSIS — R32 Unspecified urinary incontinence: Secondary | ICD-10-CM | POA: Diagnosis present

## 2017-07-24 DIAGNOSIS — R579 Shock, unspecified: Secondary | ICD-10-CM | POA: Diagnosis present

## 2017-07-24 DIAGNOSIS — I10 Essential (primary) hypertension: Secondary | ICD-10-CM | POA: Diagnosis present

## 2017-07-24 DIAGNOSIS — N390 Urinary tract infection, site not specified: Secondary | ICD-10-CM | POA: Diagnosis present

## 2017-07-24 DIAGNOSIS — G9340 Encephalopathy, unspecified: Secondary | ICD-10-CM | POA: Diagnosis present

## 2017-07-24 DIAGNOSIS — R112 Nausea with vomiting, unspecified: Secondary | ICD-10-CM

## 2017-07-24 DIAGNOSIS — Z885 Allergy status to narcotic agent status: Secondary | ICD-10-CM

## 2017-07-24 DIAGNOSIS — R748 Abnormal levels of other serum enzymes: Secondary | ICD-10-CM | POA: Diagnosis not present

## 2017-07-24 DIAGNOSIS — D649 Anemia, unspecified: Secondary | ICD-10-CM | POA: Diagnosis not present

## 2017-07-24 DIAGNOSIS — E876 Hypokalemia: Secondary | ICD-10-CM | POA: Diagnosis not present

## 2017-07-24 DIAGNOSIS — J383 Other diseases of vocal cords: Secondary | ICD-10-CM | POA: Diagnosis present

## 2017-07-24 DIAGNOSIS — F329 Major depressive disorder, single episode, unspecified: Secondary | ICD-10-CM

## 2017-07-24 DIAGNOSIS — Z9221 Personal history of antineoplastic chemotherapy: Secondary | ICD-10-CM

## 2017-07-24 DIAGNOSIS — F32A Depression, unspecified: Secondary | ICD-10-CM

## 2017-07-24 DIAGNOSIS — R2231 Localized swelling, mass and lump, right upper limb: Secondary | ICD-10-CM | POA: Diagnosis not present

## 2017-07-24 DIAGNOSIS — R7989 Other specified abnormal findings of blood chemistry: Secondary | ICD-10-CM

## 2017-07-24 DIAGNOSIS — E785 Hyperlipidemia, unspecified: Secondary | ICD-10-CM | POA: Diagnosis not present

## 2017-07-24 DIAGNOSIS — Z6841 Body Mass Index (BMI) 40.0 and over, adult: Secondary | ICD-10-CM | POA: Diagnosis not present

## 2017-07-24 DIAGNOSIS — L8990 Pressure ulcer of unspecified site, unspecified stage: Secondary | ICD-10-CM

## 2017-07-24 DIAGNOSIS — J690 Pneumonitis due to inhalation of food and vomit: Secondary | ICD-10-CM | POA: Diagnosis present

## 2017-07-24 DIAGNOSIS — Z923 Personal history of irradiation: Secondary | ICD-10-CM

## 2017-07-24 DIAGNOSIS — I469 Cardiac arrest, cause unspecified: Secondary | ICD-10-CM

## 2017-07-24 DIAGNOSIS — E440 Moderate protein-calorie malnutrition: Secondary | ICD-10-CM | POA: Diagnosis present

## 2017-07-24 DIAGNOSIS — R627 Adult failure to thrive: Secondary | ICD-10-CM | POA: Diagnosis not present

## 2017-07-24 DIAGNOSIS — N200 Calculus of kidney: Secondary | ICD-10-CM | POA: Diagnosis present

## 2017-07-24 DIAGNOSIS — Z79899 Other long term (current) drug therapy: Secondary | ICD-10-CM

## 2017-07-24 DIAGNOSIS — R778 Other specified abnormalities of plasma proteins: Secondary | ICD-10-CM

## 2017-07-24 DIAGNOSIS — Z7982 Long term (current) use of aspirin: Secondary | ICD-10-CM

## 2017-07-24 DIAGNOSIS — R531 Weakness: Secondary | ICD-10-CM

## 2017-07-24 DIAGNOSIS — I8312 Varicose veins of left lower extremity with inflammation: Secondary | ICD-10-CM | POA: Diagnosis present

## 2017-07-24 LAB — TYPE AND SCREEN
ABO/RH(D): O POS
Antibody Screen: NEGATIVE

## 2017-07-24 LAB — URINALYSIS, ROUTINE W REFLEX MICROSCOPIC
Bilirubin Urine: NEGATIVE
GLUCOSE, UA: NEGATIVE mg/dL
Ketones, ur: NEGATIVE mg/dL
NITRITE: NEGATIVE
PH: 6 (ref 5.0–8.0)
PROTEIN: 30 mg/dL — AB
Specific Gravity, Urine: 1.015 (ref 1.005–1.030)
Squamous Epithelial / LPF: NONE SEEN

## 2017-07-24 LAB — I-STAT CHEM 8, ED
BUN: 32 mg/dL — AB (ref 6–20)
CALCIUM ION: 1.09 mmol/L — AB (ref 1.15–1.40)
CHLORIDE: 105 mmol/L (ref 101–111)
CREATININE: 2.6 mg/dL — AB (ref 0.44–1.00)
Glucose, Bld: 117 mg/dL — ABNORMAL HIGH (ref 65–99)
HCT: 24 % — ABNORMAL LOW (ref 36.0–46.0)
Hemoglobin: 8.2 g/dL — ABNORMAL LOW (ref 12.0–15.0)
Potassium: 3.8 mmol/L (ref 3.5–5.1)
Sodium: 138 mmol/L (ref 135–145)
TCO2: 22 mmol/L (ref 22–32)

## 2017-07-24 LAB — CBC WITH DIFFERENTIAL/PLATELET
BASOS ABS: 0 10*3/uL (ref 0.0–0.1)
Basophils Relative: 0 %
EOS ABS: 0.2 10*3/uL (ref 0.0–0.7)
Eosinophils Relative: 1 %
HCT: 27.8 % — ABNORMAL LOW (ref 36.0–46.0)
HEMOGLOBIN: 8.3 g/dL — AB (ref 12.0–15.0)
LYMPHS ABS: 1.9 10*3/uL (ref 0.7–4.0)
Lymphocytes Relative: 8 %
MCH: 28 pg (ref 26.0–34.0)
MCHC: 29.9 g/dL — AB (ref 30.0–36.0)
MCV: 93.9 fL (ref 78.0–100.0)
MONOS PCT: 7 %
Monocytes Absolute: 1.6 10*3/uL — ABNORMAL HIGH (ref 0.1–1.0)
Neutro Abs: 19.7 10*3/uL — ABNORMAL HIGH (ref 1.7–7.7)
Neutrophils Relative %: 84 %
PLATELETS: 295 10*3/uL (ref 150–400)
RBC: 2.96 MIL/uL — AB (ref 3.87–5.11)
RDW: 17.3 % — ABNORMAL HIGH (ref 11.5–15.5)
WBC: 23.4 10*3/uL — AB (ref 4.0–10.5)

## 2017-07-24 LAB — COMPREHENSIVE METABOLIC PANEL
ALK PHOS: 97 U/L (ref 38–126)
ALT: 13 U/L — ABNORMAL LOW (ref 14–54)
ANION GAP: 12 (ref 5–15)
AST: 21 U/L (ref 15–41)
Albumin: 2.5 g/dL — ABNORMAL LOW (ref 3.5–5.0)
BUN: 34 mg/dL — ABNORMAL HIGH (ref 6–20)
CALCIUM: 8.2 mg/dL — AB (ref 8.9–10.3)
CO2: 21 mmol/L — AB (ref 22–32)
Chloride: 105 mmol/L (ref 101–111)
Creatinine, Ser: 2.59 mg/dL — ABNORMAL HIGH (ref 0.44–1.00)
GFR calc non Af Amer: 18 mL/min — ABNORMAL LOW (ref >60)
GFR, EST AFRICAN AMERICAN: 21 mL/min — AB (ref >60)
Glucose, Bld: 118 mg/dL — ABNORMAL HIGH (ref 65–99)
Potassium: 3.9 mmol/L (ref 3.5–5.1)
SODIUM: 138 mmol/L (ref 135–145)
Total Bilirubin: 0.2 mg/dL — ABNORMAL LOW (ref 0.3–1.2)
Total Protein: 6.2 g/dL — ABNORMAL LOW (ref 6.5–8.1)

## 2017-07-24 LAB — MAGNESIUM: Magnesium: 1.8 mg/dL (ref 1.7–2.4)

## 2017-07-24 LAB — I-STAT TROPONIN, ED: Troponin i, poc: 0.21 ng/mL (ref 0.00–0.08)

## 2017-07-24 LAB — PHOSPHORUS: PHOSPHORUS: 4.6 mg/dL (ref 2.5–4.6)

## 2017-07-24 LAB — I-STAT CG4 LACTIC ACID, ED
LACTIC ACID, VENOUS: 2.07 mmol/L — AB (ref 0.5–1.9)
Lactic Acid, Venous: 0.67 mmol/L (ref 0.5–1.9)

## 2017-07-24 LAB — CORTISOL: Cortisol, Plasma: 63.8 ug/dL

## 2017-07-24 LAB — PROCALCITONIN: PROCALCITONIN: 0.29 ng/mL

## 2017-07-24 LAB — SODIUM, URINE, RANDOM: Sodium, Ur: 73 mmol/L

## 2017-07-24 LAB — ABO/RH: ABO/RH(D): O POS

## 2017-07-24 LAB — BRAIN NATRIURETIC PEPTIDE: B NATRIURETIC PEPTIDE 5: 684.8 pg/mL — AB (ref 0.0–100.0)

## 2017-07-24 LAB — LACTIC ACID, PLASMA: LACTIC ACID, VENOUS: 1.4 mmol/L (ref 0.5–1.9)

## 2017-07-24 LAB — PROTIME-INR
INR: 2.05
PROTHROMBIN TIME: 23 s — AB (ref 11.4–15.2)

## 2017-07-24 LAB — LIPASE, BLOOD: Lipase: 66 U/L — ABNORMAL HIGH (ref 11–51)

## 2017-07-24 MED ORDER — SODIUM CHLORIDE 0.9 % IV BOLUS
1000.0000 mL | Freq: Once | INTRAVENOUS | Status: AC
  Administered 2017-07-24: 1000 mL via INTRAVENOUS

## 2017-07-24 MED ORDER — HYDROCORTISONE NA SUCCINATE PF 100 MG IJ SOLR: 50.0000 mg | Freq: Three times a day (TID) | INTRAMUSCULAR | Status: DC

## 2017-07-24 MED ORDER — HYDROCORTISONE NA SUCCINATE PF 100 MG IJ SOLR
100.0000 mg | Freq: Three times a day (TID) | INTRAMUSCULAR | Status: DC
  Administered 2017-07-24: 100 mg via INTRAVENOUS
  Filled 2017-07-24: qty 2

## 2017-07-24 MED ORDER — ORAL CARE MOUTH RINSE
15.0000 mL | Freq: Two times a day (BID) | OROMUCOSAL | Status: DC
  Administered 2017-07-25: 15 mL via OROMUCOSAL

## 2017-07-24 MED ORDER — SODIUM CHLORIDE 0.9 % IV SOLN
250.0000 mL | INTRAVENOUS | Status: DC | PRN
  Administered 2017-07-29: 250 mL via INTRAVENOUS

## 2017-07-24 MED ORDER — CHLORHEXIDINE GLUCONATE CLOTH 2 % EX PADS
6.0000 | MEDICATED_PAD | Freq: Every day | CUTANEOUS | Status: DC
  Administered 2017-07-25: 6 via TOPICAL

## 2017-07-24 MED ORDER — VANCOMYCIN HCL 10 G IV SOLR
1500.0000 mg | Freq: Once | INTRAVENOUS | Status: AC
  Administered 2017-07-24: 1500 mg via INTRAVENOUS
  Filled 2017-07-24: qty 1500

## 2017-07-24 MED ORDER — NOREPINEPHRINE BITARTRATE 1 MG/ML IV SOLN
0.0000 ug/min | Freq: Once | INTRAVENOUS | Status: AC
  Administered 2017-07-24: 2 ug/min via INTRAVENOUS
  Filled 2017-07-24: qty 4

## 2017-07-24 MED ORDER — SODIUM CHLORIDE 0.9 % IV SOLN: INTRAVENOUS | Status: DC | PRN

## 2017-07-24 MED ORDER — SODIUM CHLORIDE 0.9 % IV BOLUS
2000.0000 mL | Freq: Once | INTRAVENOUS | Status: AC
  Administered 2017-07-24: 2000 mL via INTRAVENOUS

## 2017-07-24 MED ORDER — LACTATED RINGERS IV BOLUS
1000.0000 mL | Freq: Once | INTRAVENOUS | Status: AC
  Administered 2017-07-24: 1000 mL via INTRAVENOUS

## 2017-07-24 MED ORDER — MUPIROCIN 2 % EX OINT
1.0000 "application " | TOPICAL_OINTMENT | Freq: Two times a day (BID) | CUTANEOUS | Status: DC
  Administered 2017-07-25 (×2): 1 via NASAL
  Filled 2017-07-24: qty 22

## 2017-07-24 MED ORDER — ONDANSETRON HCL 4 MG/2ML IJ SOLN
4.0000 mg | Freq: Four times a day (QID) | INTRAMUSCULAR | Status: DC | PRN
  Administered 2017-07-29 – 2017-08-03 (×2): 4 mg via INTRAVENOUS
  Filled 2017-07-24 (×2): qty 2

## 2017-07-24 MED ORDER — PIPERACILLIN-TAZOBACTAM 3.375 G IVPB 30 MIN
3.3750 g | Freq: Once | INTRAVENOUS | Status: AC
  Administered 2017-07-24: 3.375 g via INTRAVENOUS
  Filled 2017-07-24: qty 50

## 2017-07-24 MED ORDER — PIPERACILLIN-TAZOBACTAM IN DEX 2-0.25 GM/50ML IV SOLN
2.2500 g | Freq: Three times a day (TID) | INTRAVENOUS | Status: DC
  Administered 2017-07-24 – 2017-07-25 (×2): 2.25 g via INTRAVENOUS
  Filled 2017-07-24 (×5): qty 50

## 2017-07-24 MED ORDER — DIPHENOXYLATE-ATROPINE 2.5-0.025 MG PO TABS: 1.0000 | ORAL_TABLET | Freq: Four times a day (QID) | ORAL | 0 refills | Status: AC | PRN

## 2017-07-24 MED ORDER — LACTATED RINGERS IV SOLN
INTRAVENOUS | Status: DC
  Administered 2017-07-24: 22:00:00 via INTRAVENOUS

## 2017-07-24 MED ORDER — HEPARIN SODIUM (PORCINE) 5000 UNIT/ML IJ SOLN
5000.0000 [IU] | Freq: Three times a day (TID) | INTRAMUSCULAR | Status: DC
  Administered 2017-07-24 – 2017-08-03 (×28): 5000 [IU] via SUBCUTANEOUS
  Filled 2017-07-24 (×29): qty 1

## 2017-07-24 NOTE — ED Notes (Signed)
Intensivist Provider at bedside.

## 2017-07-24 NOTE — Telephone Encounter (Signed)
RX faxed to [facility] @ [phone], phone number [phone] 

## 2017-07-24 NOTE — ED Provider Notes (Addendum)
MSE was initiated and I personally evaluated the patient and placed orders (if any) at  3:00 PM on July 24, 2017.  The remainder of the MSE may be completed by another provider.  Patient is brought from nursing home facility.  She had episode of unresponsiveness.  Reportedly she had normal morning.  She was bathed (patient reports that when they remove her oxygen for bathing she gets very weak and a similar thing occurs.  This is equivocal history).  EMS reports that upon their arrival the patient was unresponsive and when they started giving respiratory support she pushed her hands away.  Reports they were not able to palpate pulses get a blood pressure initially.  Once she became arousable, she began answering questions.  Patient reports she just feels really fatigued.  She does not localize pain.  Patient is extremely deconditioned in appearance.  She is pale.  She does not have respiratory distress.  Heart sounds are distant.  Breath sounds soft at the bases.  No gross rhonchi.  Abdomen is obese or distended.  Patient endorses some vague discomfort.  Guarding.  Extremities have extensive amount of bruising.  1+ bilateral lower extremity edema.  Patient presents with history of severe comorbid illness.  She does appear to have critical illness condition.  Care initiated first assessment made.  Dr. Tyrone Nine will assume care.     Charlesetta Shanks, MD 07/24/17 [place], MD 07/24/17 [phone]

## 2017-07-24 NOTE — Consult Note (Addendum)
Cardiology Consultation:   Patient ID: Jennifer Delgado; [id number]; 08-24-1950   Admit date: 07/24/2017 Date of Consult: 07/24/2017  Primary Care Provider: Patient, No Pcp Per Primary Cardiologist: Sanda Klein, MD     Patient Profile:   Jennifer Delgado is a 67 y.o. female with a hx of recent VF arrest, NSTEMI/STEMI  who is being seen today for the evaluation of N/V, hypotension and EKG changes at the request of Dr Tyrone Nine   History of Present Illness:   Jennifer Delgado is a 67 yo who has a histeory of metastatic lung cancer (stage IV with mets to brain), HTN  She was recently admitted with N/V, hypotension  and VF arrest  (07/03/17)  REsuscitated   EKG with ST elevation inferiorly and T wave inversion laterally  Peak trop 4.86 Pt was intubated, treated for aspiration pneuonia, shock, adrenal insuff  Na 116  Hgb 7.9  During that admission Echo:showed  LVEF 60 to 65%, RV,mild to mod depressed   Cardiology saw the pt   Felt that cath with possible intervention should only be considered  if signif chance for recovery from CA   It was also noted that CT of chest showed no coronary calcifications.  Hospitalists managed pt for rest of stay The pt was discharged by hosp service on 4/19 to [facility]  Since d/c the pt has not had much to eat or drink   She complained of abdominal pain yesterday   Bad   No CP   Had several large BM  (not diarrhea)    Also had cough   No fever  Today she was being cleaned up in bed   Oxygen off   Noted to be hypotensvie   Brought to ED   Here she has receved 3 L IV fluids and is on Norepi.  BP improved  PT alert  The pt deneis CP   Says her breathing is OK  Does have a nonproductive cough   Denies abdominal pain tpdau   Past Medical History:  Diagnosis Date  . Brain cancer ([place])   . Cancer ([place])    lung  . Cellulitis   . Hypertension   . Liver cancer ([place])   . Renal disorder     Past Surgical History:  Procedure Laterality Date  . BRONCHOSCOPY    . LEG SURGERY    .  THORACENTESIS         Inpatient Medications: Scheduled Meds: . heparin  5,000 Units Subcutaneous Q8H  . [START ON 07/25/2017] hydrocortisone sod succinate (SOLU-CORTEF) inj  50 mg Intravenous Q8H   Continuous Infusions: . sodium chloride    . sodium chloride    . lactated ringers 1,000 mL (07/24/17 2203)  . lactated ringers 80 mL/hr at 07/24/17 2203  . piperacillin-tazobactam (ZOSYN)  IV     PRN Meds: Place/Maintain arterial line **AND** sodium chloride, sodium chloride, ondansetron (ZOFRAN) IV  Allergies:    Allergies  Allergen Reactions  . Daptomycin Other (See Comments)    Rhyabdomyolosis (a breakdown of muscle tissue that releases a damaging protein into the blood)  . Dilaudid [Hydromorphone] Nausea And Vomiting    Social History:   Social History   Socioeconomic History  . Marital status: Married    Spouse name: Not on file  . Number of children: Not on file  . Years of education: Not on file  . Highest education level: Not on file  Occupational History  . Not on file  Social Needs  . Emergency planning/management officer  strain: Not on file  . Food insecurity:    Worry: Not on file    Inability: Not on file  . Transportation needs:    Medical: Not on file    Non-medical: Not on file  Tobacco Use  . Smoking status: Never Smoker  . Smokeless tobacco: Never Used  Substance and Sexual Activity  . Alcohol use: Yes    Comment: occ  . Drug use: No  . Sexual activity: Not on file  Lifestyle  . Physical activity:    Days per week: Not on file    Minutes per session: Not on file  . Stress: Not on file  Relationships  . Social connections:    Talks on phone: Not on file    Gets together: Not on file    Attends religious service: Not on file    Active member of club or organization: Not on file    Attends meetings of clubs or organizations: Not on file    Relationship status: Not on file  . Intimate partner violence:    Fear of current or ex partner: Not on file     Emotionally abused: Not on file    Physically abused: Not on file    Forced sexual activity: Not on file  Other Topics Concern  . Not on file  Social History Narrative  . Not on file    Family History:   No FHx of CAD   ROS:  Please see the history of present illness.  All other ROS reviewed and negative.     Physical Exam/Data:   Vitals:   07/24/17 1930 07/24/17 1945 07/24/17 2000 07/24/17 2030  BP: (!) 100/54 (!) 99/57 97/65 113/62  Pulse:      Resp: 15 15 16 17   Temp:      TempSrc:      SpO2:        Intake/Output Summary (Last 24 hours) at 07/24/2017 2237 Last data filed at 07/24/2017 1814 Gross per 24 hour  Intake 4050.01 ml  Output -  Net 4050.01 ml   There were no vitals filed for this visit. There is no height or weight on file to calculate BMI.  General:  Obese 67 yo  in no acute distress HEENT: normal Lymph: no adenopathy Neck: IV   Neck is full   Endocrine:  No thryomegaly Vascular: No carotid bruits; FA pulses 2+ bilaterally without bruits  Cardiac:  normal S1, S2; RRR; no murmur Lungs:  Relatively clear anteriorly   Abd: Sl distended   + BS   Nontender   Ext: Tr edema  Feet warm   L leg wrapped   Musculoskeletal:  No deformities, BUE and BLE strength normal and equal Skin: warm and dry  Neuro:  CNs 2-12 intact, no focal abnormalities noted Psych:  Normal affect   EKG:  The EKG was personally reviewed and demonstrates:    SR 84   IWMI with sl ST elevation III, AVF   T wave inversion I, II, F, L V1 to V6 Telemetry:  Telemetry was personally reviewed and demonstrates: SR    Relevant CV Studies: Bedside echo:   LVEF normal with normal wall motion   RV dilated and RVEF is mod depressed    Laboratory Data:  Chemistry Recent Labs  Lab 07/18/17 0450 07/19/17 0515 07/24/17 1528 07/24/17 1547  NA 145 144 138 138  K 3.4* 3.6 3.9 3.8  CL 105 106 105 105  CO2 26 27 21*  --  GLUCOSE 105* 90 118* 117*  BUN 39* 34* 34* 32*  CREATININE 0.93 0.85 2.59*  2.60*  CALCIUM 8.9 8.4* 8.2*  --   GFRNONAA >60 >60 18*  --   GFRAA >60 >60 21*  --   ANIONGAP 14 11 12   --     Recent Labs  Lab 07/24/17 1528  PROT 6.2*  ALBUMIN 2.5*  AST 21  ALT 13*  ALKPHOS 97  BILITOT 0.2*   Hematology Recent Labs  Lab 07/20/17 0459 07/21/17 0530 07/24/17 1528 07/24/17 1547  WBC 9.0 9.4 23.4*  --   RBC 2.70* 2.74* 2.96*  --   HGB 8.0* 8.0* 8.3* 8.2*  HCT 26.1* 26.1* 27.8* 24.0*  MCV 96.7 95.3 93.9  --   MCH 29.6 29.2 28.0  --   MCHC 30.7 30.7 29.9*  --   RDW 17.5* 16.8* 17.3*  --   PLT 259 255 295  --    Cardiac EnzymesNo results for input(s): TROPONINI in the last 168 hours.  Recent Labs  Lab 07/24/17 1545  TROPIPOC 0.21*    BNPNo results for input(s): BNP, PROBNP in the last 168 hours.  DDimer No results for input(s): DDIMER in the last 168 hours.  Radiology/Studies:  Ct Abdomen Pelvis Wo Contrast  Result Date: 07/24/2017 CLINICAL DATA:  Acute onset of lower abdominal pain, renal failure and shock. EXAM: CT ABDOMEN AND PELVIS WITHOUT CONTRAST TECHNIQUE: Multidetector CT imaging of the abdomen and pelvis was performed following the standard protocol without IV contrast. COMPARISON:  CT of the abdomen and pelvis performed 01/15/2015, and renal ultrasound performed 01/22/2015 FINDINGS: Lower chest: Small bilateral pleural effusions are noted. Patchy bibasilar airspace opacities raise concern for pneumonia. A trace pericardial effusion is noted. Hepatobiliary: A new vague 5.5 cm mass is noted at the right hepatic lobe. A smaller 3.6 cm mass is noted more superiorly. Per correlation with the patient's clinical history, this reflects known metastatic disease to the liver. Stones are seen dependently within the gallbladder. The gallbladder is otherwise unremarkable. The common bile duct remains normal in caliber. Pancreas: The pancreas is within normal limits. Spleen: A nonspecific vague 2.9 cm hypodensity is noted at the inferior aspect of the spleen.  Decreased attenuation along the medial aspect of the spleen may reflect remote infarct. Adrenals/Urinary Tract: The adrenal glands are unremarkable in appearance. Nonspecific perinephric stranding is noted bilaterally. Scattered nonobstructing bilateral renal stones are seen, measuring up to 4 mm in size. There is no evidence of hydronephrosis. No obstructing ureteral stones are identified. Stomach/Bowel: The stomach is unremarkable in appearance. The small bowel is within normal limits. The appendix is normal in caliber, without evidence of appendicitis. Scattered diverticulosis is noted along the descending and sigmoid colon, with contrast filled diverticula. The colon is otherwise unremarkable. Vascular/Lymphatic: The abdominal aorta is unremarkable in appearance. The inferior vena cava is grossly unremarkable. No retroperitoneal lymphadenopathy is seen. No pelvic sidewall lymphadenopathy is identified. Reproductive: The bladder is markedly distended, extending above the umbilicus. The uterus is somewhat compressed but otherwise unremarkable in appearance. No suspicious adnexal masses are seen. Other: A small umbilical hernia is noted, containing only fat. Musculoskeletal: No acute osseous abnormalities are identified. There is mild chronic loss of height at vertebral body T12. The visualized musculature is unremarkable in appearance. IMPRESSION: 1. Markedly distended bladder, extending above the umbilicus. This may be contributing to the patient's renal failure. Foley catheter placement would be helpful, as deemed clinically appropriate. 2. Small bilateral pleural effusions. Patchy bibasilar airspace opacities  raise concern for pneumonia. 3. Hepatic masses measure up to 5.5 cm in size, reflecting known metastatic disease to the liver. 4. Vague 2.9 cm hypodensity at the inferior aspect of the spleen may also reflect metastatic disease. 5. Nonobstructing bilateral renal stones measure up to 4 mm in size. 6.  Cholelithiasis.  Gallbladder otherwise unremarkable. 7. Scattered diverticulosis along the descending and sigmoid colon, without evidence of diverticulitis. 8. Small umbilical hernia, containing only fat. 9. Trace pericardial effusion noted. These results were called by telephone at the time of interpretation on 07/24/2017 at 9:18 pm to Nursing on [facility], who verbally acknowledged these results. Electronically Signed   By: Garald Balding M.D.   On: 07/24/2017 21:25   Dg Chest Port 1 View  Result Date: 07/24/2017 CLINICAL DATA:  Unresponsive. EXAM: PORTABLE CHEST 1 VIEW COMPARISON:  07/12/2017 and CT chest 07/03/2017. FINDINGS: Trachea is midline. Heart size stable. Mild mixed interstitial and airspace opacification. Small bilateral pleural effusions. IMPRESSION: Resolving pneumonia or edema.  Small bilateral pleural effusions. Electronically Signed   By: Lorin Picket M.D.   On: 07/24/2017 15:58    Assessment and Plan:   Pt is a 67 yo who was recently discharged from [place]   Suffered VF arrest on admit.  No evaluation was done  Echo showed normal LVEF and mod depressed RVEF Pt admittedm today with hypotension   Has had poor po intake since Friday ON exam, pt 's BP 80s to 90 on norepi   Voluem is not increased EKG with changes similar to last admit (inferior elevation and diffuse T wave inversion)   She is not havin any pain  Bedside echo showes normal LVEF with no regional wall motion changes   RVEF is moderately down The RV dysfunction may explain T wave changes   For now I am not convinced of acute injury   I would hyrate  IF RV function down needs preload for this  Continue pressors until BP irmproves    Cycle troponin  2  Hx VF arresst  REcent   Will need to review records from around event.   Note CT of chest shows no CAD      3   Renal:   Follow   Creatinine is up some   Follow Getting fluids  4  Pulm   CCM to follow patient        For questions or updates, please contact  [facility] Please consult [email or web address] for contact info under Cardiology/STEMI.   Signed, Dorris Carnes, MD  07/24/2017 10:37 PM

## 2017-07-24 NOTE — Progress Notes (Signed)
Location:   [facility] Room Number: 128 B Place of Service:  SNF (31)   CODE STATUS: Full Code  Allergies  Allergen Reactions  . Daptomycin Other (See Comments)    Rhyabdomyolosis  . Dilaudid [Hydromorphone] Nausea And Vomiting    Chief Complaint  Patient presents with  . Hospitalization Follow-up    Hospital Follow up    HPI:  She is a 67 year old patient with a medical history of non-small cell stage IV lung cancer; hypertension. She was admitted to the hospital for hyponatremia and pneumonia. She did suffer Vfib with STEMI and was intubated. She has chronic respiratory failure with hypoxia and copd; she did finish her abt for her pneumonia. She is not a candidate for AICD or other invasive procedures. She will need to follow up cardiology and oncology. She is here for short term rehab with her goal to return back home. Her voice is weak due to vocal cord weakness sequela to intubation. She does not have an appetite; does have nausea is constipated. She will continue to be followed for her chronic illnesses including: lung cancer; gerd; hypertension.   Past Medical History:  Diagnosis Date  . Brain cancer ([place])   . Cancer ([place])    lung  . Cellulitis   . Hypertension   . Liver cancer ([place])   . Renal disorder     Past Surgical History:  Procedure Laterality Date  . BRONCHOSCOPY    . LEG SURGERY    . THORACENTESIS      Social History   Socioeconomic History  . Marital status: Married    Spouse name: Not on file  . Number of children: Not on file  . Years of education: Not on file  . Highest education level: Not on file  Occupational History  . Not on file  Social Needs  . Financial resource strain: Not on file  . Food insecurity:    Worry: Not on file    Inability: Not on file  . Transportation needs:    Medical: Not on file    Non-medical: Not on file  Tobacco Use  . Smoking status: Never Smoker  . Smokeless tobacco: Never Used    Substance and Sexual Activity  . Alcohol use: Yes    Comment: occ  . Drug use: No  . Sexual activity: Not on file  Lifestyle  . Physical activity:    Days per week: Not on file    Minutes per session: Not on file  . Stress: Not on file  Relationships  . Social connections:    Talks on phone: Not on file    Gets together: Not on file    Attends religious service: Not on file    Active member of club or organization: Not on file    Attends meetings of clubs or organizations: Not on file    Relationship status: Not on file  . Intimate partner violence:    Fear of current or ex partner: Not on file    Emotionally abused: Not on file    Physically abused: Not on file    Forced sexual activity: Not on file  Other Topics Concern  . Not on file  Social History Narrative  . Not on file   History reviewed. No pertinent family history.    VITAL SIGNS Pulse 78   Temp 97.6 F (36.4 C)   Resp 20   Ht _0  (1.651 m)   Wt 198 lb  7 oz (90 kg)   SpO2 97%   BMI 33.02 kg/m   Outpatient Encounter Medications as of 07/24/2017  Medication Sig  . albuterol (PROAIR HFA) 108 (90 Base) MCG/ACT inhaler Inhale 2 puffs into the lungs every 6 (six) hours as needed for wheezing or shortness of breath.   Marland Kitchen amiodarone (PACERONE) 400 MG tablet Take 1 tablet (400 mg total) by mouth daily.  Marland Kitchen aspirin 81 MG chewable tablet Chew 1 tablet (81 mg total) by mouth daily.  Marland Kitchen atorvastatin (LIPITOR) 40 MG tablet Take 1 tablet (40 mg total) by mouth daily at 6 PM.  . cetirizine (ZYRTEC) 10 MG tablet Take 10 mg by mouth at bedtime.  . diclofenac sodium (VOLTAREN) 1 % GEL Apply 2 g topically 4 (four) times daily as needed (For pain.).   Marland Kitchen diphenoxylate-atropine (LOMOTIL) 2.5-0.025 MG tablet Take 1 tablet by mouth 4 (four) times daily as needed for diarrhea or loose stools.  . furosemide (LASIX) 20 MG tablet Take 20 mg by mouth daily.   Marland Kitchen gabapentin (NEURONTIN) 300 MG capsule Take 900 mg by mouth at bedtime.   Marland Kitchen HYDROcodone-acetaminophen (NORCO) 10-325 MG tablet Take 1 tablet by mouth every 6 (six) hours as needed for up to 5 days (For pain.).  Marland Kitchen ibuprofen (ADVIL,MOTRIN) 200 MG tablet Take 800 mg by mouth every 6 (six) hours as needed for headache.  . isosorbide mononitrate (IMDUR) 30 MG 24 hr tablet Take 30 mg by mouth daily.  Marland Kitchen lisinopril (PRINIVIL,ZESTRIL) 10 MG tablet Take 10 mg by mouth daily.  Marland Kitchen liver oil-zinc oxide (DESITIN) 40 % ointment Apply topically as needed for irritation.  . Lorlatinib 100 MG TABS Take 100 mg by mouth daily.  . metoprolol tartrate (LOPRESSOR) 25 MG tablet Take 1 tablet (25 mg total) by mouth 2 (two) times daily.  . ondansetron (ZOFRAN) 4 MG tablet Take 4 mg by mouth every 8 (eight) hours as needed for nausea or vomiting.  . pantoprazole (PROTONIX) 40 MG tablet Take 1 tablet (40 mg total) by mouth daily.  . potassium chloride SA (K-DUR,KLOR-CON) 20 MEQ tablet Take 20 mEq by mouth daily.   . sertraline (ZOLOFT) 100 MG tablet Take 1 tablet (100 mg total) by mouth daily.  Marland Kitchen triamcinolone cream (KENALOG) 0.1 % Apply 1 application topically 2 (two) times daily as needed (For rash after chemo.).    No facility-administered encounter medications on file as of 07/24/2017.      SIGNIFICANT DIAGNOSTIC EXAMS  TODAY:   07-03-17: 2-d echo: - Left ventricle: The cavity size was normal. Wall thickness was increased in a pattern of moderate LVH. Systolic function was normal. The estimated ejection fraction was in the range of 60%  to 65%. Wall motion was normal; there were no regional wall motion abnormalities. Doppler parameters are consistent with abnormal left ventricular relaxation (grade 1 diastolic dysfunction). - Right ventricle: The cavity size was mildly dilated. Systolic function was mildly to moderately reduced. - Pericardium, extracardiac: A small pericardial effusion was identified. There was no evidence of hemodynamic compromise. Features were not consistent with  tamponade physiology.   07-03-17: bilateral lower extremity doppler: Right: There is no evidence of deep vein thrombosis in the lower extremity. However, portions of this examination were limited- see technologist comments above. No cystic structure found in the popliteal fossa. Left: There is no evidence of deep vein thrombosis in the lower extremity. However, portions of this examination were limited- see technologist comments above. No cystic structure found in the popliteal fossa.  07-03-17: ct of head: 1. Several new small areas of hypoattenuation are present in right frontal, right parietal, right occipital, left posterior temporal lobes as well as bilateral cerebellar hemispheres, probably representing interval infarctions. Multiple vascular territories suggests embolic phenomenon. 2. Stable lucency in left frontal lobe probably representing chronic microvascular ischemic change, possibly treated metastasis.  07-03-17: ct of chest: Extensive BILATERAL pulmonary infiltrates which could be related to infection or aspiration. Small to moderate low-attenuation RIGHT pleural effusion. BILATERAL rib and sternal fractures question related to CPR/resuscitation.  07-12-17: MRI: brain: 1. Numerous supra- and infratentorial early subacute mildly enhancing infarcts attributable to hypoperfusion/cardiac arrest, though also seen with embolic phenomena. 2. Numerous small supra- and infratentorial metastasis. 3. Mild parenchymal brain volume loss.  07-14-17: bilateral upper extremity doppler: Right: Technically difficult due to body habitus and uncontrolled constant tremors of the arm. There is no obvious evidence of DVT . There is evidence of superficial thrombosis of the basilic and cephalic veins. Left: Unable to visualize the subclavian vein due to bandages from a recent IJ line removal.   LABS REVIEWED:   06-29-17: wbc 4.2; hgb 10.6; hct 30.0; mcv 88.5 ;plt 190; glucose 92; bun 7; creat 0.58; k+ 4.0; na++  116; ca 8.3; liver normal albumin 3.5 07-01-17: wbc 3.8; hgb 9.3; hct 27.3; mcv 88.9; plt 146; glucose 75; bun 6; creat 0.50; k+ 3.3; na++ 133; ca 7.9 07-03-17: glucose 324; bun 5; creat 0.76; k+ 3.7; na++ 126; ast 212; alt 93; alk phos 172; albumin 2.8; mag 1.9; tsh 3.942 07-06-17: wbc 6.7; hgb 7.4; hct 23.3; mcv 93.2; plt 115; glucose 131; bun 19; creat 0.77; k+ 3.4; na++ 138; ca 8.1;  07-09-17: iron 29; tibc 259; ferritin 150  07-11-17: wbc 5.4; hgb 7.9; hct 26.2; mcv 100.0; plt 312; glucose 133; bun 42; create 0.68; k+ 3.7; na++ 147; ca 8.7; mag 2.1 07-17-17: wbc 12.2; hgb 8.4; hct 28.0; mcv 98.9; plt 341; glucose 116; bun 44; creat 0.80; k+ 3.3; na++ 142; ca 8.7; ast 18; alt 20; alk phos 85; albumin 2.9; mag 2.3 07-21-17: wbc 9.4; hgb 8.0; hct 26.1; mcv 95.3; plt 255    Review of Systems  Reason unable to perform ROS: poor historian   Constitutional: Negative for malaise/fatigue.  Respiratory: Negative for cough.   Cardiovascular: Negative for chest pain.  Gastrointestinal: Negative for constipation.  Musculoskeletal: Negative for myalgias.  Skin: Negative.   Neurological: Negative for dizziness.  Psychiatric/Behavioral: The patient is not nervous/anxious.     Physical Exam  Constitutional: She appears well-developed and well-nourished. No distress.  Obese   Neck: No thyromegaly present.  Cardiovascular: Normal rate.  Murmur heard. Heart rate irregular 1/6 Unable to palpate pedal pulses   Pulmonary/Chest: Effort normal. No respiratory distress.  Breath sounds diminished 02 dependent   Abdominal: She exhibits distension. There is no tenderness.  Abdomen is firm and distended bowel sounds hypoactive Had large BM with rolling to side.   Musculoskeletal:  Is able to move upper extremities Left hand is cold to touch   Lymphadenopathy:    She has no cervical adenopathy.  Neurological: She is alert.  Skin: Skin is warm and dry. She is not diaphoretic.  Skin fragile and bruised left  hand is discolored   Psychiatric: She has a normal mood and affect.     ASSESSMENT/ PLAN:  TODAY:   1. STEMI: is status post vfib arrest; is not a candidate for ICD placement: stable will continue asa 81 mg daily imdur 30 mg daily  2. Hypertension; essential; benign: stable will continue lisinopril 10 mg daily lopressor 25 mg twice daily   3. Acute on chronic respiratory failure with hypoxia: stable is 02 dependent: will continue albuterol 2 puffs every 6 hours as needed  4. VFIB with cardiac arrest: is stable will continue amiodarone 400 mg daily for heart rate control..  5. Non-small cell lung cancer with metastasis: is without change: is followed by oncology will continue larlantinib 100 mg daily   6. Dyslipidemia: stable will continue lipitor 40 mg daily   7. Allergic rhinitis: is stable will continue zyrtec 10 mg daily  8.  Bilateral lower extremity edema: stable will continue lasix 20 mg daily with k+ 20 meq daily  9. gerd without esophagitis: stable will continue proonix 40 mg daily   10. Depression: stable will continue zoloft 100 mg daily   11. Pain related to cancer: will continue neurontin 900 mg nightly and will continue vicodin 10/325 mg every 6 hour as needed.   12. Intractable n/v: is without change: will place on clear liquids for 48 hours; will begin compazine 10 mg every 6 hours as needed for 14 days; will advance diet as tolerated  Will check cbc; cmp   .   MD is aware of resident's narcotic use and is in agreement with current plan of care. We will attempt to wean resident as apropriate   Ok Edwards NP [facility]  Contact [phone] Monday through Friday 8am- 5pm  After hours call [phone]

## 2017-07-24 NOTE — ED Triage Notes (Signed)
Patient presents to [facility] , staff states patient was fine this am  And when they went to check on her at lunch patient was unresp. Bagged by FD x 10 mins upon ems arrival patient was unresp. And started coming around . Upon arrival to ed patient is alert no complaints . She is very sleepy acting. Husband and mother at bedside. Husband states when patient is left without her 75 , of which he states she was offf for at least 20 mins today. Patient is alert and oriented to place.

## 2017-07-24 NOTE — ED Provider Notes (Addendum)
[facility] EMERGENCY DEPARTMENT Provider Note   CSN: [id number] Arrival date & time: 07/24/17  1432     History   Chief Complaint Chief Complaint  Patient presents with  . Altered Mental Status    HPI Jennifer Delgado is a 67 y.o. female.  67 yo F with a chief complaint of weakness.  The patient had 3 large bowel movements a day and then was noted to be very weak.  Is not had anything to eat or drink for the past few days as well.  Was seen by the physician at her skilled nursing facility and found to have a very low blood pressure and was sent to the ED for further evaluation.  The family denies any fevers or chills.  Has had a very mild cough.  Was on antibiotics in the hospital for presumed pneumonia.  The patient also had a cardiac arrest during that stay.  She not receive a cardiac catheterization secondary to her condition.  She has a presumed good outlook because she has a certain genetic mutation with her stage IV lung cancer that is treatable with chemotherapy.  She continues to do oral chemotherapy as well as radiation therapy. Level V caveat acuity of condition     Past Medical History:  Diagnosis Date  . Brain cancer ([place])   . Cancer ([place])    lung  . Cellulitis   . Hypertension   . Liver cancer ([place])   . Renal disorder     Patient Active Problem List   Diagnosis Date Noted  . Acute on chronic respiratory failure with hypoxia ([place]) 07/17/2017  . HCAP (healthcare-associated pneumonia) 07/17/2017  . Cardiac arrest ([place])   . Encounter for central line placement   . Acute encephalopathy   . Hyponatremia 06/30/2017  . Intractable nausea and vomiting 06/30/2017  . Non-small cell lung cancer with metastasis ([place]) 06/30/2017  . Lipodermatosclerosis of lower extremity due to varicose veins 06/30/2017  . Depression 06/30/2017  . GERD (gastroesophageal reflux disease) 06/30/2017    Past Surgical History:  Procedure Laterality Date  . BRONCHOSCOPY      . LEG SURGERY    . THORACENTESIS       OB History   None      Home Medications    Prior to Admission medications   Medication Sig Start Date End Date Taking? Authorizing Provider  albuterol (PROAIR HFA) 108 (90 Base) MCG/ACT inhaler Inhale 2 puffs into the lungs every 6 (six) hours as needed for wheezing or shortness of breath.  12/25/15 04/20/18 Yes [provider]  amiodarone (PACERONE) 400 MG tablet Take 1 tablet (400 mg total) by mouth daily. 07/21/17  Yes Doreatha Lew, MD  aspirin 81 MG chewable tablet Chew 1 tablet (81 mg total) by mouth daily. 07/21/17  Yes Patrecia Pour, Christean Grief, MD  atorvastatin (LIPITOR) 40 MG tablet Take 1 tablet (40 mg total) by mouth daily at 6 PM. 07/21/17  Yes Patrecia Pour, Christean Grief, MD  cetirizine (ZYRTEC) 10 MG tablet Take 10 mg by mouth at bedtime.   Yes [provider]  diclofenac sodium (VOLTAREN) 1 % GEL Apply 2 g topically every 6 (six) hours as needed (for pain).    Yes [provider]  diphenoxylate-atropine (LOMOTIL) 2.5-0.025 MG tablet Take 1 tablet by mouth 4 (four) times daily as needed for diarrhea or loose stools. Patient taking differently: Take 1 tablet by mouth every 6 (six) hours as needed for diarrhea or loose stools.  07/24/17  Yes Gerlene Fee, NP  furosemide (LASIX) 20 MG tablet Take 20 mg by mouth daily.    Yes [provider]  gabapentin (NEURONTIN) 300 MG capsule Take 900 mg by mouth at bedtime. 04/27/17  Yes [provider]  HYDROcodone-acetaminophen (NORCO) 10-325 MG tablet Take 1 tablet by mouth every 6 (six) hours as needed for up to 5 days (For pain.). Patient taking differently: Take 1 tablet by mouth every 6 (six) hours as needed (for pain through 07/27/17).  07/21/17 07/26/17 Yes Patrecia Pour, Christean Grief, MD  ibuprofen (ADVIL,MOTRIN) 800 MG tablet Take 800 mg by mouth every 6 (six) hours as needed for headache.   Yes [provider]  isosorbide mononitrate (IMDUR) 30 MG 24 hr  tablet Take 30 mg by mouth daily. 06/14/17  Yes [provider]  lisinopril (PRINIVIL,ZESTRIL) 10 MG tablet Take 10 mg by mouth daily.   Yes [provider]  Lorlatinib 100 MG TABS Take 100 mg by mouth daily. 05/16/17  Yes [provider]  metoprolol tartrate (LOPRESSOR) 25 MG tablet Take 1 tablet (25 mg total) by mouth 2 (two) times daily. 07/21/17  Yes Patrecia Pour, Christean Grief, MD  ondansetron (ZOFRAN) 4 MG tablet Take 4 mg by mouth every 8 (eight) hours as needed for nausea or vomiting.   Yes [provider]  OXYGEN Inhale 2 L into the lungs continuous.   Yes [provider]  pantoprazole (PROTONIX) 40 MG tablet Take 1 tablet (40 mg total) by mouth daily. 07/21/17  Yes Patrecia Pour, Christean Grief, MD  potassium chloride SA (K-DUR,KLOR-CON) 20 MEQ tablet Take 20 mEq by mouth daily.    Yes [provider]  sertraline (ZOLOFT) 100 MG tablet Take 1 tablet (100 mg total) by mouth daily. 07/21/17  Yes Patrecia Pour, Christean Grief, MD  triamcinolone cream (KENALOG) 0.1 % Apply 1 application topically every 12 (twelve) hours as needed (for rash following chemo).  05/11/17  Yes [provider]  liver oil-zinc oxide (DESITIN) 40 % ointment Apply topically as needed for irritation. Patient not taking: Reported on 07/24/2017 07/21/17   Doreatha Lew, MD    Family History No family history on file.  Social History Social History   Tobacco Use  . Smoking status: Never Smoker  . Smokeless tobacco: Never Used  Substance Use Topics  . Alcohol use: Yes    Comment: occ  . Drug use: No     Allergies   Daptomycin and Dilaudid [hydromorphone]   Review of Systems Review of Systems  Unable to perform ROS: Acuity of condition  Constitutional: Negative for chills and fever.  HENT: Negative for congestion and rhinorrhea.   Eyes: Negative for redness and visual disturbance.  Respiratory: Negative for shortness of breath and wheezing.   Cardiovascular: Negative  for chest pain and palpitations.  Gastrointestinal: Negative for nausea and vomiting.  Genitourinary: Negative for dysuria and urgency.  Musculoskeletal: Negative for arthralgias and myalgias.  Skin: Negative for pallor and wound.  Neurological: Negative for dizziness and headaches.     Physical Exam Updated Vital Signs BP (!) 116/98   Pulse 82   Temp 97.7 F (36.5 C) (Oral)   Resp 18   SpO2 100%   Physical Exam  Constitutional: She appears well-developed. No distress.  Chronically ill-appearing pale  HENT:  Head: Normocephalic and atraumatic.  Eyes: Pupils are equal, round, and reactive to light. EOM are normal.  Neck: Normal range of motion. Neck supple.  Cardiovascular: Normal rate and regular rhythm. Exam reveals  no gallop and no friction rub.  No murmur heard. Pulmonary/Chest: Effort normal. She has no wheezes. She has no rales.  Abdominal: Soft. She exhibits no distension. There is no tenderness.  Musculoskeletal: She exhibits no edema or tenderness.  Neurological: She is alert.  mildly confused  Skin: Skin is warm and dry. She is not diaphoretic.  Psychiatric: She has a normal mood and affect. Her behavior is normal.  Nursing note and vitals reviewed.    ED Treatments / Results  Labs (all labs ordered are listed, but only abnormal results are displayed) Labs Reviewed  COMPREHENSIVE METABOLIC PANEL - Abnormal; Notable for the following components:      Result Value   CO2 21 (*)    Glucose, Bld 118 (*)    BUN 34 (*)    Creatinine, Ser 2.59 (*)    Calcium 8.2 (*)    Total Protein 6.2 (*)    Albumin 2.5 (*)    ALT 13 (*)    Total Bilirubin 0.2 (*)    GFR calc non Af Amer 18 (*)    GFR calc Af Amer 21 (*)    All other components within normal limits  LIPASE, BLOOD - Abnormal; Notable for the following components:   Lipase 66 (*)    All other components within normal limits  CBC WITH DIFFERENTIAL/PLATELET - Abnormal; Notable for the following components:    WBC 23.4 (*)    RBC 2.96 (*)    Hemoglobin 8.3 (*)    HCT 27.8 (*)    MCHC 29.9 (*)    RDW 17.3 (*)    Neutro Abs 19.7 (*)    Monocytes Absolute 1.6 (*)    All other components within normal limits  PROTIME-INR - Abnormal; Notable for the following components:   Prothrombin Time 23.0 (*)    All other components within normal limits  I-STAT CHEM 8, ED - Abnormal; Notable for the following components:   BUN 32 (*)    Creatinine, Ser 2.60 (*)    Glucose, Bld 117 (*)    Calcium, Ion 1.09 (*)    Hemoglobin 8.2 (*)    HCT 24.0 (*)    All other components within normal limits  I-STAT TROPONIN, ED - Abnormal; Notable for the following components:   Troponin i, poc 0.21 (*)    All other components within normal limits  I-STAT CG4 LACTIC ACID, ED - Abnormal; Notable for the following components:   Lactic Acid, Venous 2.07 (*)    All other components within normal limits  CULTURE, BLOOD (ROUTINE X 2)  CULTURE, BLOOD (ROUTINE X 2)  BRAIN NATRIURETIC PEPTIDE  URINALYSIS, ROUTINE W REFLEX MICROSCOPIC  I-STAT CG4 LACTIC ACID, ED  TYPE AND SCREEN  ABO/RH    EKG EKG Interpretation  Date/Time:  Monday July 24 2017 14:47:16 EDT Ventricular Rate:  81 PR Interval:    QRS Duration: 114 QT Interval:  408 QTC Calculation: 474 R Axis:   22 Text Interpretation:  Sinus rhythm Incomplete right bundle branch block Low voltage, precordial leads Repol abnrm suggests ischemia, lateral leads no sig change from previous. III and aVr previously abnormal Confirmed by Charlesetta Shanks [phone]) on 07/24/2017 2:59:19 PM   Radiology Dg Chest Port 1 View  Result Date: 07/24/2017 CLINICAL DATA:  Unresponsive. EXAM: PORTABLE CHEST 1 VIEW COMPARISON:  07/12/2017 and CT chest 07/03/2017. FINDINGS: Trachea is midline. Heart size stable. Mild mixed interstitial and airspace opacification. Small bilateral pleural effusions. IMPRESSION: Resolving pneumonia or edema.  Small  bilateral pleural effusions. Electronically  Signed   By: Lorin Picket M.D.   On: 07/24/2017 15:58    Procedures Procedures (including critical care time) Procedure note: Ultrasound Guided Peripheral IV Ultrasound guided peripheral 1.88 inch angiocath IV placement performed by me. Indications: Nursing unable to place IV. Details: The antecubital fossa and upper arm were evaluated with a multifrequency linear probe. Patent brachial veins were noted. 1 attempt was made to cannulate a vein under realtime US guidance with successful cannulation of the vein and catheter placement. There is return of non-pulsatile dark red blood. The patient tolerated the procedure well without complications. Images archived electronically.  CPT codes: [phone] and [phone]  EJ placement: 18 gauge IV placed in R EJ. Skin prepped with alcohol pads, R EJ identified with Valsalva. Cannulated with good return of dark, non-pulsatile blood. Tachyderm placed after easily flushed with NS.  Medications Ordered in ED Medications  0.9 %  sodium chloride infusion (has no administration in time range)  hydrocortisone sodium succinate (SOLU-CORTEF) 100 MG injection 100 mg (100 mg Intravenous Given 07/24/17 1630)  sodium chloride 0.9 % bolus 1,000 mL (0 mLs Intravenous Stopped 07/24/17 1636)  sodium chloride 0.9 % bolus 2,000 mL (0 mLs Intravenous Stopped 07/24/17 1621)  vancomycin (VANCOCIN) 1,500 mg in sodium chloride 0.9 % 500 mL IVPB (0 mg Intravenous Stopped 07/24/17 1756)  piperacillin-tazobactam (ZOSYN) IVPB 3.375 g (0 g Intravenous Stopped 07/24/17 1721)  norepinephrine (LEVOPHED) 4 mg in dextrose 5 % 250 mL (0.016 mg/mL) infusion (4 mcg/min Intravenous Rate/Dose Change 07/24/17 1814)  sodium chloride 0.9 % bolus 1,000 mL (0 mLs Intravenous Stopped 07/24/17 1814)     Initial Impression / Assessment and Plan / ED Course  I have reviewed the triage vital signs and the nursing notes.  Pertinent labs & imaging results that were available during my care of the patient were  reviewed by me and considered in my medical decision making (see chart for details).     67 yo F with a cc of weakness.  The patient was found to be profoundly hypotensive on arrival.  Blood pressure was in the 40s with maps in the 30s.  She was difficult IV stick and so I placed 2 IV lines well in the room on my exam.  Give 2 L of fluid wide open as the patient is not had anything to eat or drink for the past couple days.  She is recently in the hospital for a STEMI and resultant cardiac arrest.  She had improvement and was sent home to a skilled nursing facility.  The patient has stage IV lung cancer and is currently undergoing therapy she apparently has a genetic mutation which is thought to be treatable with chemotherapy and has a good outlook and so the patient has continued to be a full code.  The patient has had some improvement of her blood pressure with IV fluids though her maps remain in the 50s.  She was given 4L.  Was started on levofed.  I discussed the case with critical care will come down and evaluate the patient.  She does have a positive troponin and EKG changes that are similar to when there was some concern for a ST elevation MI.  I discussed the case with cardiology, Dr. Harrington Challenger who will come and consult on the patient.  CRITICAL CARE Performed by: Cecilio Asper   Total critical care time: 80 minutes  Critical care time was exclusive of separately billable procedures and treating other  patients.  Critical care was necessary to treat or prevent imminent or life-threatening deterioration.  Critical care was time spent personally by me on the following activities: development of treatment plan with patient and/or surrogate as well as nursing, discussions with consultants, evaluation of patient's response to treatment, examination of patient, obtaining history from patient or surrogate, ordering and performing treatments and interventions, ordering and review of laboratory  studies, ordering and review of radiographic studies, pulse oximetry and re-evaluation of patient's condition.  The patients results and plan were reviewed and discussed.   Any x-rays performed were independently reviewed by myself.   Differential diagnosis were considered with the presenting HPI.  Medications  0.9 %  sodium chloride infusion (has no administration in time range)  hydrocortisone sodium succinate (SOLU-CORTEF) 100 MG injection 100 mg (100 mg Intravenous Given 07/24/17 1630)  sodium chloride 0.9 % bolus 1,000 mL (0 mLs Intravenous Stopped 07/24/17 1636)  sodium chloride 0.9 % bolus 2,000 mL (0 mLs Intravenous Stopped 07/24/17 1621)  vancomycin (VANCOCIN) 1,500 mg in sodium chloride 0.9 % 500 mL IVPB (0 mg Intravenous Stopped 07/24/17 1756)  piperacillin-tazobactam (ZOSYN) IVPB 3.375 g (0 g Intravenous Stopped 07/24/17 1721)  norepinephrine (LEVOPHED) 4 mg in dextrose 5 % 250 mL (0.016 mg/mL) infusion (4 mcg/min Intravenous Rate/Dose Change 07/24/17 1814)  sodium chloride 0.9 % bolus 1,000 mL (0 mLs Intravenous Stopped 07/24/17 1814)    Vitals:   07/24/17 1630 07/24/17 1700 07/24/17 1716 07/24/17 1730  BP: (!) 73/45 (!) 78/45 (!) 66/50 (!) 116/98  Pulse:      Resp: 20 19 17 18   Temp:      TempSrc:      SpO2:        Final diagnoses:  Shock ([facility])  Weakness  Troponin level elevated    Admission/ observation were discussed with the admitting physician, patient and/or family and they are comfortable with the plan.   Final Clinical Impressions(s) / ED Diagnoses   Final diagnoses:  Shock ([place])  Weakness  Troponin level elevated    ED Discharge Orders    None       Deno Etienne, DO 07/24/17 [place], DO 07/24/17 2325

## 2017-07-24 NOTE — H&P (Addendum)
PULMONARY / CRITICAL CARE MEDICINE   Name: Jennifer Delgado MRN: [id number] DOB: 03-01-1951    ADMISSION DATE:  07/24/2017 CONSULTATION DATE:  07/24/2017  REFERRING MD:  Dr. Tyrone Nine  CHIEF COMPLAINT:  Hypotension  HISTORY OF PRESENT ILLNESS:   67 year old female with past medical history of non-small cell stage IV carcinoma (diagnosed October 2016, adenocarcinoma with positive ALK gene translocation s/p treatment w/ Viviann Spare and Alectnib) with progression of metastasis to the brain, hypertension, obesity, chronic hypoxic resp failure on baseline 2L home oxygen presenting from SNF with altered mental status and hypotension.    Recently hospitalized from 3/28 to 4/19 with hyponatremia and possibly pneumonia.  Hospital course complicated by V. fib arrest with STEMI on 4/1, respiratory failure, and AKI.  She was not a candidate for invasive procedures and treated medically.  10 day course of zosyn then rocephin completed for pneumonia.  Additionally patient had ongoing nausea and vomiting without signs of obstruction or ilues on abdominal xray.  She was found to have vocal cord weakness and dysphagia following extubation.  She was placed on a dysphagia 3 diet per speech recommendations.  Additionally evaluated by Oncology with recommendations to start Lorlatinib for 10 days.    Since discharge, patient and husband report very poor appetite with little PO intake, nausea, some constipation followed 3 large bowel movements, non-specific, and abdominal discomfort. Cdiff negative 4/17.  Denies fever but had ongoing cough and soft voice since extubation.  Denies chest pain or discomfort, or shortness of breath.    States was getting her bath today and not wearing her oxygen for about 20 minutes when she initially became altered.  On EMS arrival, she was unresponsive and respiratory support given until patient pushed them away with her hands.  In the ER, she was sleepy but alert and oriented.  Found to be  hypotensive with BP 55/29, afebrile, HR 82, respirations unlabored.  She was given 3L NS and then placed on low dose levophed peripherally.  Labs noted for WBC 23.4, sCr 2.6 (discharge sCr 0.85), troponin 0.21, EKG without acute changes, lactic acid 2.07, CXR noted for resolving edema/ pneumonia.  PCCM consulted for admission.    PAST MEDICAL HISTORY :  She  has a past medical history of Brain cancer ([place]), Cancer ([place]), Cellulitis, Hypertension, Liver cancer ([place]), and Renal disorder.  PAST SURGICAL HISTORY: She  has a past surgical history that includes Thoracentesis; Bronchoscopy; and Leg Surgery.  Allergies  Allergen Reactions  . Daptomycin Other (See Comments)    Rhyabdomyolosis (a breakdown of muscle tissue that releases a damaging protein into the blood)  . Dilaudid [Hydromorphone] Nausea And Vomiting    No current facility-administered medications on file prior to encounter.    Current Outpatient Medications on File Prior to Encounter  Medication Sig  . albuterol (PROAIR HFA) 108 (90 Base) MCG/ACT inhaler Inhale 2 puffs into the lungs every 6 (six) hours as needed for wheezing or shortness of breath.   Marland Kitchen amiodarone (PACERONE) 400 MG tablet Take 1 tablet (400 mg total) by mouth daily.  Marland Kitchen aspirin 81 MG chewable tablet Chew 1 tablet (81 mg total) by mouth daily.  Marland Kitchen atorvastatin (LIPITOR) 40 MG tablet Take 1 tablet (40 mg total) by mouth daily at 6 PM.  . cetirizine (ZYRTEC) 10 MG tablet Take 10 mg by mouth at bedtime.  . diclofenac sodium (VOLTAREN) 1 % GEL Apply 2 g topically every 6 (six) hours as needed (for pain).   Marland Kitchen diphenoxylate-atropine (LOMOTIL) 2.5-0.025  MG tablet Take 1 tablet by mouth 4 (four) times daily as needed for diarrhea or loose stools. (Patient taking differently: Take 1 tablet by mouth every 6 (six) hours as needed for diarrhea or loose stools. )  . furosemide (LASIX) 20 MG tablet Take 20 mg by mouth daily.   Marland Kitchen gabapentin (NEURONTIN) 300 MG capsule Take 900 mg by  mouth at bedtime.  Marland Kitchen HYDROcodone-acetaminophen (NORCO) 10-325 MG tablet Take 1 tablet by mouth every 6 (six) hours as needed for up to 5 days (For pain.). (Patient taking differently: Take 1 tablet by mouth every 6 (six) hours as needed (for pain through 07/27/17). )  . ibuprofen (ADVIL,MOTRIN) 800 MG tablet Take 800 mg by mouth every 6 (six) hours as needed for headache.  . isosorbide mononitrate (IMDUR) 30 MG 24 hr tablet Take 30 mg by mouth daily.  Marland Kitchen lisinopril (PRINIVIL,ZESTRIL) 10 MG tablet Take 10 mg by mouth daily.  . Lorlatinib 100 MG TABS Take 100 mg by mouth daily.  . metoprolol tartrate (LOPRESSOR) 25 MG tablet Take 1 tablet (25 mg total) by mouth 2 (two) times daily.  . ondansetron (ZOFRAN) 4 MG tablet Take 4 mg by mouth every 8 (eight) hours as needed for nausea or vomiting.  . OXYGEN Inhale 2 L into the lungs continuous.  . pantoprazole (PROTONIX) 40 MG tablet Take 1 tablet (40 mg total) by mouth daily.  . potassium chloride SA (K-DUR,KLOR-CON) 20 MEQ tablet Take 20 mEq by mouth daily.   . sertraline (ZOLOFT) 100 MG tablet Take 1 tablet (100 mg total) by mouth daily.  Marland Kitchen triamcinolone cream (KENALOG) 0.1 % Apply 1 application topically every 12 (twelve) hours as needed (for rash following chemo).   Marland Kitchen liver oil-zinc oxide (DESITIN) 40 % ointment Apply topically as needed for irritation. (Patient not taking: Reported on 07/24/2017)    FAMILY HISTORY:  Her has no family status information on file.    SOCIAL HISTORY: She  reports that she has never smoked. She has never used smokeless tobacco. She reports that she drinks alcohol. She reports that she does not use drugs.  REVIEW OF SYSTEMS: POSITIVES IN BOLD Gen: Denies fever, chills, weight change, fatigue HEENT: Denies dysphagia and hoarseness  PULM: Denies shortness of breath, cough, sputum production, hemoptysis, wheezing CV: Denies chest pain, edema, palpitations GI: Denies abdominal pain, nausea, vomiting, diarrhea,  hematochezia, melena, constipation,  GU: Denies dysuria, hematuria Endocrine: Denies  appetite change Neuro: Denies headache, numbness, generalized weakness, slurred speech, loss of consciousness  SUBJECTIVE:   VITAL SIGNS: BP (!) 116/98   Pulse 82   Temp 97.7 F (36.5 C) (Oral)   Resp 18   SpO2 100%   HEMODYNAMICS:    VENTILATOR SETTINGS:    INTAKE / OUTPUT: No intake/output data recorded.  PHYSICAL EXAMINATION: General:  Acute/chronically ill older WF lying in bed in NAD HEENT: MM pink/dry, lips cracked, pupils reactive, anicteric, thick neck, voice soft low quality Neuro: Awake, oriented x 3, MAE with generalized weakness CV: rrr, no m/r/g PULM: even/non-labored on 2L [place] , lungs bilaterally clear GI: obese, distended, diffusely tender, hyperactive BS Extremities: cool/dry, very diminished cap refill distally, no BLE edema, chronic venous stasis ulcer to RLE- dressing in place, +odor Skin: rash to groin/diaper area, bruising to bilateral arms,    LABS:  BMET Recent Labs  Lab 07/18/17 0450 07/19/17 0515 07/24/17 1528 07/24/17 1547  NA 145 144 138 138  K 3.4* 3.6 3.9 3.8  CL 105 106 105 105  CO2 26 27 21*  --   BUN 39* 34* 34* 32*  CREATININE 0.93 0.85 2.59* 2.60*  GLUCOSE 105* 90 118* 117*    Electrolytes Recent Labs  Lab 07/18/17 0450 07/19/17 0515 07/24/17 1528  CALCIUM 8.9 8.4* 8.2*    CBC Recent Labs  Lab 07/20/17 0459 07/21/17 0530 07/24/17 1528 07/24/17 1547  WBC 9.0 9.4 23.4*  --   HGB 8.0* 8.0* 8.3* 8.2*  HCT 26.1* 26.1* 27.8* 24.0*  PLT 259 255 295  --     Coag's Recent Labs  Lab 07/24/17 1528  INR 2.05    Sepsis Markers Recent Labs  Lab 07/24/17 1547  LATICACIDVEN 2.07*    ABG No results for input(s): PHART, PCO2ART, PO2ART in the last 168 hours.  Liver Enzymes Recent Labs  Lab 07/24/17 1528  AST 21  ALT 13*  ALKPHOS 97  BILITOT 0.2*  ALBUMIN 2.5*    Cardiac Enzymes No results for input(s): TROPONINI,  PROBNP in the last 168 hours.  Glucose Recent Labs  Lab 07/21/17 0756 07/21/17 0838 07/21/17 1228 07/21/17 1401 07/21/17 1647 07/21/17 1951  GLUCAP 69 76 59* 85 81 98    Imaging Dg Chest Port 1 View  Result Date: 07/24/2017 CLINICAL DATA:  Unresponsive. EXAM: PORTABLE CHEST 1 VIEW COMPARISON:  07/12/2017 and CT chest 07/03/2017. FINDINGS: Trachea is midline. Heart size stable. Mild mixed interstitial and airspace opacification. Small bilateral pleural effusions. IMPRESSION: Resolving pneumonia or edema.  Small bilateral pleural effusions. Electronically Signed   By: Lorin Picket M.D.   On: 07/24/2017 15:58   STUDIES:  4/22 CXR >> Mild mixed interstitial and airspace opacification. Small bilateral pleural effusions.  Resolving pneumonia or edema.   CULTURES: 4/22 BC x 2 >>  ANTIBIOTICS: 4/22 vanc >> 4/22 zosyn >>  SIGNIFICANT EVENTS: 3/28- 4/19 hospitalization 4/22 Admit  LINES/TUBES: PIV x 2   DISCUSSION: 64 yoF presenting from SNF with nausea, decreased PO intake, abd discomfort since discharge with AMS after being off oxygen found to have hypotension in ER.  AMS resolved after bagging.  Requiring low dose levophed after 3L NS  ASSESSMENT / PLAN:  PULMONARY A: Acute on chronic hypoxic respiratory failure - at baseline respiratory on 2L [place] without increased WOB - CXR appears improved from prior, continued small bilateral effusions Non-small cell stage IV carcinoma with mets Dysphagia and vocal cord dysfunction following prior intubation - on nectar thick liquids per SLP recs on previous admit; will need MBS prior to advancement P:   Continue 2L O2 or for sats > 92% Obtain ABG if change in mental status Aspiration precautions duonebs prn Hold Lorlatinib for now, ?not sure if she has been taking   CARDIOVASCULAR A:  Shock- suspected hypovolemic from poor PO intake, r/o septic, cardiogenic Hx HTN, STEMI, TTE 07/03/17 with EF 60-65%, G1DD P:  Tele  monitoring Additional 1 L of LR over 1 hour Goal MAP > 65 Continue low dose levophed for now, if increased dose needed, will need CVL Trend lactate Cards consulted by ER for positive troponin, EKG without acute changes, bedside TTE per cards, formal TTE to follow Trend troponin/ EKG Assess BNP Hold home hypertensive's- lopressor, imdur, lasix, lisinopril  Hold asa, lipitor, amiodarone while npo Continue solu-cortef 50 mg q 6  RENAL A:   AKI  P:   Additional 1L LR now Insert indwelling foley Awaiting UA Trend BMP /mag/ phos/ daily wts/  urinary output Replace electrolytes as indicated  GASTROINTESTINAL A:   Nausea, anorexia, abd discomfort,  diarrhea R/o illeus R/o cdiff, intra-abd process/ infection - neg Cdiff on 4/17, KUB neg for ileus 4/18 P:   NPO zofran prn, monitor QTc, 4/22 at 0.474 CT of abd  Send stool for cdiff   HEMATOLOGIC A:   Anemia - stable  P:  Trend CBC SCDs and heparin SQ  INFECTIOUS A:   Leukocyotsis R/o sepsis - no clear infiltrate on CXR, - ddx  Intra-abd source vs LLE/wound infection vs urinary vs bacteremia P:   Pending UA  Follow BC x 2 Assess PCT, if negative consider d/c of abx Empiric zosyn and vanc for now, may need vanc po  empiric enteric precautions Stool for cdiff   ENDOCRINE A:   Adrenal insufficency - cortisol 11 on 4/1 P:   assess cortisol and TSH CBG q 4 while NPO  NEUROLOGIC A:   Acute encephalopathy - resolved in ER, ? Syncopal episode 2/2 hypotension Brain mets P:   Monitor neuro status PT/OT when appropriate  Hold home norco, zoloft, Neurontin while npo FAMILY  - Updates: Patient and husband, Waunita Schooner [phone]) updated at bedside on plan of care.  At this point, patient wishes for all measures currently.    - Inter-disciplinary family meet or Palliative Care meeting due by:  4/30  CCT 60 mins  Kennieth Rad, AGACNP-BC [facility] Pgr: [phone] or if no answer  [phone] 07/24/2017, 8:10 PM

## 2017-07-24 NOTE — Progress Notes (Signed)
Pharmacy Antibiotic Note  Jennifer Delgado is a 67 y.o. female with non-small cell stage carcinoma with brain mets who presented to [facility] from SNF on 4/22 with AMS and hypotension concerning for sepsis. Pharmacy has been consulted for Vancomycin and Zosyn dosing.   The patient is noted to be in AKI, SCr 2.6 (BL <1), estimated CrCl<20 ml/min.   Plan: - Vancomycin 1500 mg IV x 1 - No standing Vancomycin doses for now with AKI - Zosyn 3.375g IV x 1 followed by 2.25g IV every 8 hours - Will continue to follow renal function, culture results, LOT, and antibiotic de-escalation plans      Temp (24hrs), Avg:97.7 F (36.5 C), Min:97.6 F (36.4 C), Max:97.7 F (36.5 C)  Recent Labs  Lab 07/18/17 0450 07/19/17 0515 07/20/17 0459 07/21/17 0530 07/24/17 1528 07/24/17 1547 07/24/17 2002  WBC 10.0 7.0 9.0 9.4 23.4*  --   --   CREATININE 0.93 0.85  --   --  2.59* 2.60*  --   LATICACIDVEN  --   --   --   --   --  2.07* 0.67    Estimated Creatinine Clearance: 23.9 mL/min (A) (by C-G formula based on SCr of 2.6 mg/dL (H)).    Allergies  Allergen Reactions  . Daptomycin Other (See Comments)    Rhyabdomyolosis (a breakdown of muscle tissue that releases a damaging protein into the blood)  . Dilaudid [Hydromorphone] Nausea And Vomiting    Antimicrobials this admission: Vanc 4/22 >> Zosyn 4/22 >>  Dose adjustments this admission: n/a  Microbiology results: 4/22 BCx >>  Thank you for allowing pharmacy to be a part of this patient's care.  Alycia Rossetti, PharmD, BCPS Clinical Pharmacist Pager: [phone] Clinical phone for 07/24/2017: [phone] If after 3:30p, please call main pharmacy at: [phone] 07/24/2017 8:27 PM

## 2017-07-25 DIAGNOSIS — R579 Shock, unspecified: Secondary | ICD-10-CM

## 2017-07-25 DIAGNOSIS — N179 Acute kidney failure, unspecified: Secondary | ICD-10-CM

## 2017-07-25 LAB — CBC
HCT: 24.2 % — ABNORMAL LOW (ref 36.0–46.0)
Hemoglobin: 7.2 g/dL — ABNORMAL LOW (ref 12.0–15.0)
MCH: 28 pg (ref 26.0–34.0)
MCHC: 29.8 g/dL — ABNORMAL LOW (ref 30.0–36.0)
MCV: 94.2 fL (ref 78.0–100.0)
Platelets: 169 10*3/uL (ref 150–400)
RBC: 2.57 MIL/uL — ABNORMAL LOW (ref 3.87–5.11)
RDW: 17.1 % — ABNORMAL HIGH (ref 11.5–15.5)
WBC: 16.2 10*3/uL — ABNORMAL HIGH (ref 4.0–10.5)

## 2017-07-25 LAB — GLUCOSE, CAPILLARY
GLUCOSE-CAPILLARY: 102 mg/dL — AB (ref 65–99)
GLUCOSE-CAPILLARY: 75 mg/dL (ref 65–99)
GLUCOSE-CAPILLARY: 76 mg/dL (ref 65–99)
GLUCOSE-CAPILLARY: 84 mg/dL (ref 65–99)
Glucose-Capillary: 46 mg/dL — ABNORMAL LOW (ref 65–99)
Glucose-Capillary: 40 mg/dL — CL (ref 65–99)
Glucose-Capillary: 79 mg/dL (ref 65–99)
Glucose-Capillary: 93 mg/dL (ref 65–99)

## 2017-07-25 LAB — COMPREHENSIVE METABOLIC PANEL
ALT: 13 U/L — ABNORMAL LOW (ref 14–54)
AST: 20 U/L (ref 15–41)
Albumin: 2.1 g/dL — ABNORMAL LOW (ref 3.5–5.0)
Alkaline Phosphatase: 82 U/L (ref 38–126)
Anion gap: 8 (ref 5–15)
BUN: 32 mg/dL — ABNORMAL HIGH (ref 6–20)
CO2: 20 mmol/L — ABNORMAL LOW (ref 22–32)
Calcium: 7.4 mg/dL — ABNORMAL LOW (ref 8.9–10.3)
Chloride: 111 mmol/L (ref 101–111)
Creatinine, Ser: 1.9 mg/dL — ABNORMAL HIGH (ref 0.44–1.00)
GFR calc Af Amer: 31 mL/min — ABNORMAL LOW (ref >60)
GFR calc non Af Amer: 27 mL/min — ABNORMAL LOW (ref >60)
Glucose, Bld: 103 mg/dL — ABNORMAL HIGH (ref 65–99)
Potassium: 4.2 mmol/L (ref 3.5–5.1)
Sodium: 139 mmol/L (ref 135–145)
Total Bilirubin: 0.4 mg/dL (ref 0.3–1.2)
Total Protein: 5.5 g/dL — ABNORMAL LOW (ref 6.5–8.1)

## 2017-07-25 LAB — PHOSPHORUS: Phosphorus: 4.8 mg/dL — ABNORMAL HIGH (ref 2.5–4.6)

## 2017-07-25 LAB — TSH: TSH: 2.814 u[IU]/mL (ref 0.350–4.500)

## 2017-07-25 LAB — TROPONIN I: Troponin I: 0.17 ng/mL (ref <0.03)

## 2017-07-25 LAB — PATHOLOGIST SMEAR REVIEW

## 2017-07-25 LAB — MAGNESIUM: Magnesium: 1.8 mg/dL (ref 1.7–2.4)

## 2017-07-25 LAB — BRAIN NATRIURETIC PEPTIDE: B Natriuretic Peptide: 677 pg/mL — ABNORMAL HIGH (ref 0.0–100.0)

## 2017-07-25 MED ORDER — ATORVASTATIN CALCIUM 40 MG PO TABS
40.0000 mg | ORAL_TABLET | Freq: Every day | ORAL | Status: DC
  Administered 2017-07-25 – 2017-08-03 (×10): 40 mg via ORAL
  Filled 2017-07-25: qty 1
  Filled 2017-07-25: qty 2
  Filled 2017-07-25 (×9): qty 1

## 2017-07-25 MED ORDER — DEXTROSE 50 % IV SOLN
50.0000 mL | Freq: Once | INTRAVENOUS | Status: AC
  Filled 2017-07-25: qty 50

## 2017-07-25 MED ORDER — LORLATINIB 100 MG PO TABS
100.0000 mg | ORAL_TABLET | Freq: Every day | ORAL | Status: DC
  Administered 2017-07-26 – 2017-08-01 (×8): 100 mg via ORAL
  Filled 2017-07-25 (×11): qty 1

## 2017-07-25 MED ORDER — PIPERACILLIN-TAZOBACTAM 3.375 G IVPB
3.3750 g | Freq: Three times a day (TID) | INTRAVENOUS | Status: DC
  Administered 2017-07-25 – 2017-07-27 (×4): 3.375 g via INTRAVENOUS
  Filled 2017-07-25 (×7): qty 50

## 2017-07-25 MED ORDER — DEXTROSE-NACL 5-0.9 % IV SOLN
INTRAVENOUS | Status: DC
  Administered 2017-07-25 – 2017-07-26 (×2): via INTRAVENOUS

## 2017-07-25 MED ORDER — DEXTROSE 50 % IV SOLN
50.0000 mL | Freq: Once | INTRAVENOUS | Status: DC
  Administered 2017-07-25: 50 mL via INTRAVENOUS
  Filled 2017-07-25: qty 50

## 2017-07-25 MED ORDER — PRO-STAT SUGAR FREE PO LIQD
30.0000 mL | Freq: Every day | ORAL | Status: DC
  Administered 2017-07-25 – 2017-07-30 (×6): 30 mL via ORAL
  Filled 2017-07-25 (×8): qty 30

## 2017-07-25 MED ORDER — LORLATINIB 100 MG PO TABS: 100.0000 mg | ORAL_TABLET | Freq: Every day | ORAL | Status: DC

## 2017-07-25 MED ORDER — DEXTROSE 50 % IV SOLN
INTRAVENOUS | Status: AC
  Filled 2017-07-25: qty 50

## 2017-07-25 MED ORDER — DEXTROSE 50 % IV SOLN
50.0000 mL | Freq: Once | INTRAVENOUS | Status: AC
  Administered 2017-07-25: 50 mL via INTRAVENOUS

## 2017-07-25 MED ORDER — ASPIRIN 81 MG PO CHEW
81.0000 mg | CHEWABLE_TABLET | Freq: Every day | ORAL | Status: DC
  Administered 2017-07-25 – 2017-08-03 (×10): 81 mg via ORAL
  Filled 2017-07-25 (×10): qty 1

## 2017-07-25 MED ORDER — ENSURE ENLIVE PO LIQD
237.0000 mL | Freq: Every day | ORAL | Status: DC
  Administered 2017-07-25 – 2017-07-31 (×6): 237 mL via ORAL

## 2017-07-25 MED ORDER — NON FORMULARY: 100.0000 mg | Freq: Every day | Status: DC

## 2017-07-25 MED ORDER — FOOD THICKENER (SIMPLYTHICK)
1.0000 | ORAL | Status: DC | PRN
  Filled 2017-07-25: qty 1

## 2017-07-25 MED ORDER — AMIODARONE HCL 200 MG PO TABS
400.0000 mg | ORAL_TABLET | Freq: Every day | ORAL | Status: DC
  Administered 2017-07-25 – 2017-07-28 (×4): 400 mg via ORAL
  Filled 2017-07-25 (×4): qty 2

## 2017-07-25 MED ORDER — DEXTROSE 10 % IV SOLN
INTRAVENOUS | Status: DC
  Administered 2017-07-25: 13:00:00 via INTRAVENOUS

## 2017-07-25 NOTE — Progress Notes (Signed)
PULMONARY  / CRITICAL CARE MEDICINE  Name: Jennifer Delgado MRN: [id number] DOB: Aug 28, 1950    LOS: 36  REFERRING MD :  Dr. Tyrone Nine  CHIEF COMPLAINT:  Acute encephalopathy and hypovolemic shock   BRIEF PATIENT DESCRIPTION: 67 yo F with PMH NSCLC ALK+ on Xalkorioa and alectinib, chronic hypoxic respiratory failure on 2L [place] at home, HT, and obesity who presented from SNF with acute encephalopathy after removing home O2 x 20 minutes for a bath. She was also found to be hypotensive and required low dose levophed for blood pressure support. She was recently admitted 3/28-4/19 for hyponatremia and PNAN treated with Zosyn and Rocephin. Her hospital course was complicated by Vfib arrest due to STEMI that was medically treated as she was not a candidate for invasive interventions.   LINES / TUBES: 4/22 PIV x2   CULTURES: 4/22 blood cultures sent and pending  4/23 urine culture pending   ANTIBIOTICS: Vancomycin 4/22 Zosyn 4/22>  SIGNIFICANT EVENTS:  4/22 - Admitted to the ICU due to hypovolemic shock with hypotension requiring pressors   INTERVAL HISTORY: Afebrile and hemodynamically stable. Off pressors since last night. Alert and oriented. Feeling much better than yesterday. Abdominal discomfort improved, which she describes as suprapubic in nature. Denies diarrhea, describes loose stools at SNF. Denies any issues with voiding such as retention prior to admission. Reports urinary incontinence at baseline.    VITAL SIGNS: Temp:  [97.5 F (36.4 C)-97.7 F (36.5 C)] 97.5 F (36.4 C) (04/23 0416) Pulse Rate:  [75-83] 75 (04/23 0500) Resp:  [12-22] 14 (04/23 0600) BP: (55-119)/(29-98) 106/64 (04/23 0600) SpO2:  [95 %-100 %] 95 % (04/23 0600) Weight:  [198 lb 7 oz (90 kg)-223 lb 5.2 oz (101.3 kg)] 223 lb 5.2 oz (101.3 kg) (04/23 0500) HEMODYNAMICS:   VENTILATOR SETTINGS:   INTAKE / OUTPUT: Intake/Output      04/22 0701 - 04/23 0700 04/23 0701 - 04/24 0700   I.V. (mL/kg) 698.9 (6.9)    IV  Piggyback 4100    Total Intake(mL/kg) 4798.9 (47.4)    Urine (mL/kg/hr) 2640    Total Output 2640    Net +2158.9           PHYSICAL EXAMINATION: General:  Awake ad alert, resting comfortably in bed  Neuro:  Alert and oriented, follows commands, no focal deficits noted  HEENT:  NCAT, dry MMM, OP clear, PERRL Cardiovascular:  RRR, no mrg  Lungs:  Decreased breath sounds at bases, otherwise clear without crackles or wheezes  Abdomen: soft, NTND, decreased bowel sounds Musculoskeletal:  No deformities or lowe extremity edema  Skin:  Diffuse bruising on bilateral UE, ulceration of LLE at ankle extending to shin healing well with granulation tissue noted    LABS: Cbc Recent Labs  Lab 07/21/17 0530 07/24/17 1528 07/24/17 1547 07/25/17 0247  WBC 9.4 23.4*  --  16.2*  HGB 8.0* 8.3* 8.2* 7.2*  HCT 26.1* 27.8* 24.0* 24.2*  PLT 255 295  --  169    Chemistry  Recent Labs  Lab 07/19/17 0515 07/24/17 1528 07/24/17 1547 07/24/17 2134 07/25/17 0247  NA 144 138 138  --  139  K 3.6 3.9 3.8  --  4.2  CL 106 105 105  --  111  CO2 27 21*  --   --  20*  BUN 34* 34* 32*  --  32*  CREATININE 0.85 2.59* 2.60*  --  1.90*  CALCIUM 8.4* 8.2*  --   --  7.4*  MG  --   --   --  1.8 1.8  PHOS  --   --   --  4.6 4.8*  GLUCOSE 90 118* 117*  --  103*    Liver fxn Recent Labs  Lab 07/24/17 1528 07/25/17 0247  AST 21 20  ALT 13* 13*  ALKPHOS 97 82  BILITOT 0.2* 0.4  PROT 6.2* 5.5*  ALBUMIN 2.5* 2.1*   coags Recent Labs  Lab 07/24/17 1528  INR 2.05   Sepsis markers Recent Labs  Lab 07/24/17 1547 07/24/17 2002 07/24/17 2134  LATICACIDVEN 2.07* 0.67 1.4  PROCALCITON  --   --  0.29   Cardiac markers No results for input(s): CKTOTAL, CKMB, TROPONINI in the last 168 hours. BNP No results for input(s): PROBNP in the last 168 hours. ABG Recent Labs  Lab 07/24/17 1547  TCO2 22    CBG trend Recent Labs  Lab 07/21/17 1401 07/21/17 1647 07/21/17 1951 07/25/17 0029  07/25/17 0415  GLUCAP 85 81 98 102* 76    IMAGING:  ECG:  DIAGNOSES: Active Problems:   Shock ([place])   Pressure injury of skin   ASSESSMENT / PLAN:  PULMONARY A: Acute on chronic hypoxic respiratory failure - At baseline respiratory on 2L [place] without increased WOB - CXR appears improved from prior, continued small bilateral effusions Non-small cell stage IV carcinoma with mets to the brain and liver (?new) Dysphagia and vocal cord dysfunction following prior intubation - On nectar thick liquids per SLP recs on previous admit; will need MBS prior to advancement P:   - Continue 2L O2 or for sats > 92% - Obtain ABG if change in mental status - Aspiration precautions - Duonebs prn - Hold Lorlatinib for now - Stable for transfer from ICU to telemetry   CARDIOVASCULAR A:  Shock- suspected hypovolemic from poor PO intake, r/o septic, cardiogenic Hx HTN, STEMI, TTE 07/03/17 with EF 60-65%, G1DD BNP 644 P:  - S/p 5L bolus  - Initially on levophed, now off after IVF resuscitation and hemodynamically stable  - Goal MAP > 65 -Tele monitoring - Cards consulted by ER for positive troponin. No further interventions at this time.  - Trend troponin - Hold home hypertensive's- lopressor, imdur, lasix, lisinopril  - Resume home asa, lipitor, amiodarone 4/ - D/c solu-cortef 50 mg q 6  RENAL A:   AKI - likely a combination of pre and intra-renal in the setting of hypovolemic shock and urinary retention. U sodium 73 though unclear if study obtained after IVF resuscitation UTI - UA with leukocytes, TNTC Wbc, and many bacteria Urinary retention - Unclear etiology, ?medication side effect. Non-obstructive nephrolithiasis on CT abd/pelvis P:   - Foley in place  - Monitor lytes and UOP  - Replace electrolytes as indicated - Avoid anticholinergic  Medications  - On Zosyn   GASTROINTESTINAL A:   Nonspecific GI sxs - N/V and loose stools prior to presentation. May be compression by  extensively distended bladder. No further emesis or loose stools.  P:   - NPO -> Dysphagia 3  - zofran prn, monitor QTc, 4/22 at 0.474  - D/c c diff   HEMATOLOGIC A:   Anemia - stable  P:  - Follow CBC - SCDs and heparin SQ  INFECTIOUS A:   UTI  Non-infected LLE ulcers  P:   - Follow BC x 2 and Ucx  - Continue Zosyn pending Ucx   - D/c vancomycin  - D/c c diff study and enteric precautions  - Wound care following   ENDOCRINE A:  Hypoglycemia m P:   - Switch IVF to D5 1/2 NS @ 75cc/h  - CBG q 4h  NEUROLOGIC A:   Acute encephalopathy - likely 2/2 hypoxia after removing supplemental oxygen for bathing at SNF  Brain mets  P:   - Monitor neuro status - currently fully alert and oriented - PT/OT when appropriate  - Hold home norco, zoloft, Neurontin. May resume tomorrow   FAMILY  - Updates: Patient and husband, Waunita Schooner [phone]) updated at bedside 4/22 on plan of care.  At this point, patient wishes for all measures currently.     Welford Roche, MD  Internal Medicine PGY-1  P [phone]  07/25/2017, 7:24 AM

## 2017-07-25 NOTE — Procedures (Signed)
Objective Swallowing Evaluation: Type of Study: FEES-Fiberoptic Endoscopic Evaluation of Swallow   Patient Details  Name: Jennifer Delgado MRN: [id number] Date of Birth: 04-06-1950  Today's Date: 07/25/2017 Time: SLP Start Time (ACUTE ONLY): 1314 -SLP Stop Time (ACUTE ONLY): 1402  SLP Time Calculation (min) (ACUTE ONLY): 48 min   Past Medical History:  Past Medical History:  Diagnosis Date  . Brain cancer ([place])   . Cancer ([place])    lung  . Cellulitis   . Hypertension   . Liver cancer ([place])   . Renal disorder    Past Surgical History:  Past Surgical History:  Procedure Laterality Date  . BRONCHOSCOPY    . LEG SURGERY    . THORACENTESIS     HPI: 42 yoF presenting from SNF with nausea, decreased PO intake, abd discomfort since recent discharge (4/19) with AMS after being off oxygen. Pt admitted with hypovolemic shock with HTN requiring pressors, acute on chronic respiratory failure. Pt was seen during recent admission with evidence of post-extubation dysphagia, weak cough and dysphonic voice. Pt was most recently recommended to have Dys 3 diet and nectar thick liquids with use of chin tuck; thin liquids via provale cup only (MBS 4/18).    Subjective: pt describes good compliance with previous dysphagia recommendations, thinks that her voice is improving    Assessment / Plan / Recommendation  CHL IP CLINICAL IMPRESSIONS 07/25/2017  Clinical Impression Pt shows mild improvements from prios MBS, most recently completed on 4/18, with dysphagia still felt to be related to prolonged intubation. Her left true vocal fold does not appear to have much if any movement, while her right true vocal fold appears to be trying to compensate to achieve improving glottal closure. She initially had good airway protection with thin liquids when using a chin tuck, but pt also has mild, generalized weakness that leaves residue (L>R) throughout the pharynx with all consistencies, although increasing with  solids. When pt's valleculae contains solid residuals, she can no longer contain the thin liquids before she swallows and they spill into her airway. Penetration is deep to the vocal folds with some potentially passing beyond them, and although she triggers a reflexive cough it is a subtle one. SLP provided Min cues to clear remaining penetrates. A head turn left was attempted but did not provide much difference in swallowing efficiency or safety. Recommend continuing Dys 3 diet and nectar thick liquids using a chin tuck, but would allow sips of thin water in between meals per the water protocol (wait 30 min after other POs, clean mouth first, drink water only, use a chin tuck). Will continue to follow for tolerance and potential to advance. Pt may benefit from ENT consult given lack of mobility in her L vocal fold.  SLP Visit Diagnosis Dysphagia, oropharyngeal phase (R13.12)  Attention and concentration deficit following --  Frontal lobe and executive function deficit following --  Impact on safety and function Mild aspiration risk;Moderate aspiration risk      CHL IP TREATMENT RECOMMENDATION 07/25/2017  Treatment Recommendations Therapy as outlined in treatment plan below     Prognosis 07/25/2017  Prognosis for Safe Diet Advancement Good  Barriers to Reach Goals --  Barriers/Prognosis Comment --    CHL IP DIET RECOMMENDATION 07/25/2017  SLP Diet Recommendations Dysphagia 3 (Mech soft) solids;Nectar thick liquid;Other (Comment)  Liquid Administration via Cup;Straw  Medication Administration Whole meds with puree  Compensations Slow rate;Small sips/bites;Chin tuck  Postural Changes Remain semi-upright after after feeds/meals (Comment);Seated upright at 90  degrees      CHL IP OTHER RECOMMENDATIONS 07/25/2017  Recommended Consults --  Oral Care Recommendations Oral care BID  Other Recommendations Order thickener from pharmacy;Prohibited food (jello, ice cream, thin soups);Remove water pitcher       CHL IP FOLLOW UP RECOMMENDATIONS 07/25/2017  Follow up Recommendations Skilled Nursing facility      [facility] IP FREQUENCY AND DURATION 07/25/2017  Speech Therapy Frequency (ACUTE ONLY) min 2x/week  Treatment Duration 2 weeks           CHL IP ORAL PHASE 07/25/2017  Oral Phase Impaired  Oral - Pudding Teaspoon --  Oral - Pudding Cup --  Oral - Honey Teaspoon --  Oral - Honey Cup --  Oral - Nectar Teaspoon NT  Oral - Nectar Cup NT  Oral - Nectar Straw WFL  Oral - Thin Teaspoon WFL  Oral - Thin Cup WFL  Oral - Thin Straw WFL  Oral - Puree WFL  Oral - Mech Soft NT  Oral - Regular Impaired mastication  Oral - Multi-Consistency --  Oral - Pill NT  Oral Phase - Comment --    CHL IP PHARYNGEAL PHASE 07/25/2017  Pharyngeal Phase Impaired  Pharyngeal- Pudding Teaspoon --  Pharyngeal --  Pharyngeal- Pudding Cup --  Pharyngeal --  Pharyngeal- Honey Teaspoon --  Pharyngeal --  Pharyngeal- Honey Cup --  Pharyngeal --  Pharyngeal- Nectar Teaspoon NT  Pharyngeal --  Pharyngeal- Nectar Cup NT  Pharyngeal --  Pharyngeal- Nectar Straw Delayed swallow initiation-vallecula;Reduced pharyngeal peristalsis;Reduced tongue base retraction;Pharyngeal residue - valleculae;Pharyngeal residue - pyriform;Compensatory strategies attempted (with notebox);Reduced anterior laryngeal mobility  Pharyngeal Material does not enter airway  Pharyngeal- Thin Teaspoon Delayed swallow initiation-vallecula;Reduced pharyngeal peristalsis;Reduced tongue base retraction;Pharyngeal residue - pyriform;Compensatory strategies attempted (with notebox);Reduced anterior laryngeal mobility  Pharyngeal Material does not enter airway  Pharyngeal- Thin Cup Delayed swallow initiation-vallecula;Reduced pharyngeal peristalsis;Reduced tongue base retraction;Pharyngeal residue - pyriform;Compensatory strategies attempted (with notebox);Reduced airway/laryngeal closure  Pharyngeal Material does not enter airway  Pharyngeal- Thin  Straw Delayed swallow initiation-vallecula;Reduced pharyngeal peristalsis;Reduced tongue base retraction;Pharyngeal residue - valleculae;Pharyngeal residue - pyriform;Reduced airway/laryngeal closure;Penetration/Aspiration during swallow;Compensatory strategies attempted (with notebox)  Pharyngeal Material enters airway, CONTACTS cords and then ejected out  Pharyngeal- Puree Delayed swallow initiation-vallecula;Reduced pharyngeal peristalsis;Reduced tongue base retraction;Pharyngeal residue - valleculae;Pharyngeal residue - pyriform;Lateral channel residue;Reduced airway/laryngeal closure  Pharyngeal --  Pharyngeal- Mechanical Soft NT  Pharyngeal --  Pharyngeal- Regular Delayed swallow initiation-vallecula;Reduced pharyngeal peristalsis;Reduced tongue base retraction;Pharyngeal residue - valleculae;Lateral channel residue;Reduced airway/laryngeal closure  Pharyngeal --  Pharyngeal- Multi-consistency --  Pharyngeal --  Pharyngeal- Pill NT  Pharyngeal --  Pharyngeal Comment --     CHL IP CERVICAL ESOPHAGEAL PHASE 07/25/2017  Cervical Esophageal Phase WFL  Pudding Teaspoon --  Pudding Cup --  Honey Teaspoon --  Honey Cup --  Nectar Teaspoon --  Nectar Cup --  Nectar Straw --  Thin Teaspoon --  Thin Cup --  Thin Straw --  Puree --  Mechanical Soft --  Regular --  Multi-consistency --  Pill --  Cervical Esophageal Comment --    CHL IP GO 07/20/2017  Functional Assessment Tool Used    Functional Limitations (None)  Swallow Current Status (P6195) (None)  Swallow Goal Status (K9326) (None)  Swallow Discharge Status (Z1245) (None)  Motor Speech Current Status (Y0998) (None)  Motor Speech Goal Status (P3825) (None)  Motor Speech Goal Status (K5397) (None)  Spoken Language Comprehension Current Status (Q7341) (None)  Spoken Language Comprehension Goal Status (P3790) (None)  Spoken Language Comprehension Discharge Status [phone]) (None)  Spoken Language Expression Current Status  [phone]) (None)  Spoken Language Expression Goal Status [phone]) (None)  Spoken Language Expression Discharge Status [phone]) (None)  Attention Current Status (P4982) (None)  Attention Goal Status (M4158) (None)  Attention Discharge Status [phone]) (None)  Memory Current Status (H6808) (None)  Memory Goal Status (U1103) (None)  Memory Discharge Status (P5945) (None)  Voice Current Status (O5929) (None)  Voice Goal Status (W4462) (None)  Voice Discharge Status (M6381) (None)  Other Speech-Language Pathology Functional Limitation Current Status (R7116) (None)  Other Speech-Language Pathology Functional Limitation Goal Status (F7903) (None)  Other Speech-Language Pathology Functional Limitation Discharge Status [phone]) (None)    Germain Osgood 07/25/2017, 3:57 PM    Germain Osgood, M.A. CCC-SLP [phone]

## 2017-07-25 NOTE — Progress Notes (Signed)
Nutrition Follow-up  DOCUMENTATION CODES:   Obesity unspecified  INTERVENTION:  Provide Ensure Enlive po once daily (thickened to appropriate consistency), each supplement provides 350 kcal and 20 grams of protein.  Provide 30 ml Prostat po once daily, each supplement provides 100 kcal and 15 grams of protein.   Encourage adequate PO intake.   NUTRITION DIAGNOSIS:   Increased nutrient needs related to chronic illness as evidenced by estimated needs.  GOAL:   Patient will meet greater than or equal to 90% of their needs  MONITOR:   PO intake, Supplement acceptance, Diet advancement, Weight trends, Labs, Skin, I & O's  REASON FOR ASSESSMENT:   Low Braden    ASSESSMENT:   67 year old female with past medical history of non-small cell stage IV carcinoma (diagnosed October 2016, adenocarcinoma with positive ALK gene translocation s/p treatment w/ Viviann Spare and Alectnib) with progression of metastasis to the brain, hypertension, obesity, chronic hypoxic resp failure on baseline 2L home oxygen presenting from SNF with altered mental status and hypotension.    Pt was asleep during time of visit and did not awaken to RD. No family at bedside. Per RN, pt awaiting on lunch for arrival as pt did not receive breakfast tray. Per SLP swallow evaluation, pt with mild to moderate aspiration risk and recommends dysphagia 3 diet with nectar thick liquids. Plan for FEES in the afternoon to better assess oropharyngeal function. RD to order nutritional supplements to aid in caloric and protein needs.  Labs and medications reviewed. Phosphorous elevated at 4.8.  NUTRITION - FOCUSED PHYSICAL EXAM:    Most Recent Value  Orbital Region  No depletion  Upper Arm Region  No depletion  Thoracic and Lumbar Region  No depletion  Buccal Region  No depletion  Temple Region  No depletion  Clavicle Bone Region  No depletion  Clavicle and Acromion Bone Region  No depletion  Scapular Bone Region  No  depletion  Dorsal Hand  No depletion  Patellar Region  No depletion  Anterior Thigh Region  No depletion  Posterior Calf Region  No depletion  Edema (RD Assessment)  Mild  Hair  Reviewed  Eyes  Unable to assess  Mouth  Reviewed  Skin  Reviewed  Nails  Reviewed       Diet Order:  Aspiration precautions DIET DYS 3 Room service appropriate? Yes; Fluid consistency: Nectar Thick  EDUCATION NEEDS:   Not appropriate for education at this time  Skin:  Skin Assessment: Skin Integrity Issues: Skin Integrity Issues:: Other (Comment), DTI DTI: buttocks Other: non pressure injury to L leg  Last BM:  4/22  Height:   Ht Readings from Last 1 Encounters:  07/25/17 5' 4"  (1.626 m)    Weight:   Wt Readings from Last 1 Encounters:  07/25/17 231 lb 7.7 oz (105 kg)    Ideal Body Weight:  54.5 kg  BMI:  Body mass index is 39.73 kg/m.  Estimated Nutritional Needs:   Kcal:  1900-2200  Protein:  105-125 grams  Fluid:  >/= 2 L/day    Corrin Parker, MS, RD, LDN Pager # [phone] After hours/ weekend pager # [phone]

## 2017-07-25 NOTE — Progress Notes (Signed)
Pharmacy Antibiotic Note  Jennifer Delgado is a 67 y.o. female with non-small cell stage carcinoma with brain mets who presented to [facility] from SNF on 4/22 with AMS and hypotension concerning for sepsis. Pharmacy has been consulted for Vancomycin and Zosyn dosing. AKI now improving, concern for recurrent UTI. UCx pending.  Plan: -Stop vancomycin -Adjust Zosyn to 3.375g IV q8h EI -F/U urine culture and narrow as able  Height: 5\' 5"  (165.1 cm) Weight: 223 lb 5.2 oz (101.3 kg) IBW/kg (Calculated) : 57  Temp (24hrs), Avg:97.6 F (36.4 C), Min:97.5 F (36.4 C), Max:97.7 F (36.5 C)  Recent Labs  Lab 07/19/17 0515 07/20/17 0459 07/21/17 0530 07/24/17 1528 07/24/17 1547 07/24/17 2002 07/24/17 2134 07/25/17 0247  WBC 7.0 9.0 9.4 23.4*  --   --   --  16.2*  CREATININE 0.85  --   --  2.59* 2.60*  --   --  1.90*  LATICACIDVEN  --   --   --   --  2.07* 0.67 1.4  --     Estimated Creatinine Clearance: 34.8 mL/min (A) (by C-G formula based on SCr of 1.9 mg/dL (H)).    Allergies  Allergen Reactions  . Daptomycin Other (See Comments)    Rhyabdomyolosis (a breakdown of muscle tissue that releases a damaging protein into the blood)  . Dilaudid [Hydromorphone] Nausea And Vomiting    Antimicrobials this admission: Vanc 4/22 x1 Zosyn 4/22 >>  Dose adjustments this admission: n/a  Microbiology results: 4/22 BCx: sent 4/22 UCx: pending  Thank you for allowing pharmacy to be a part of this patient's care.  Arrie Senate, PharmD, BCPS PGY-2 Cardiology Pharmacy Resident Pager: [phone] 07/25/2017

## 2017-07-25 NOTE — Consult Note (Signed)
[place] Nurse wound consult note Reason for Consult: Consult requested for left leg.  Pt is familiar to [facility] team from recent admission. Pt has chronic wounds to left anterior and posterior calf related to lipodermatosclerosis.  She is followed by the outpatient wound center and had skin grafts applied in the past.  Pt uses Drawtex dressings to absorb drainage, which are not available in the [place]. She uses Surepress for compression wraps which are also not available. Wound type: Multiple full thickness wounds Measurement: Affected patchy area to anterior and posterior legs are 9X4X.2cm, 3X2X.2cm and 15X6X.2cm, seperated by narrow skin bridges. Wound bed: Skin graft sites are pink and moist, other areas are yellow and moist. Drainage (amount, consistency, odor) Mod amt tan drainage, no odor. Dressing procedure/placement/frequency: Bedside nurse to change dressings Q day; instructions have been provided.  Mepitel contact layers to protect skin graft sites from adherence of dressing and decrease discomfort with dressing changes. Substitute Aquacel for Drawtex to provide antimicrobial benefits and absorb drainage.  ABD pads and kerlex, then ace wrap for light compression, since Surepress is not available. Reviewed plan of care with patient and she verbalized understanding.   Please re-consult if further assistance is needed.  Thank-you,  Julien Girt MSN, [place], [place], [place], [place]

## 2017-07-25 NOTE — Progress Notes (Signed)
Progress Note  Patient Name: Rula Keniston Date of Encounter: 07/25/2017  Primary Cardiologist: Sanda Klein, MD   Subjective   Breathing is OK at rest  No CP  Abdomen without pain  Inpatient Medications    Scheduled Meds: . amiodarone  400 mg Oral Daily  . aspirin  81 mg Oral Daily  . atorvastatin  40 mg Oral q1800  . Chlorhexidine Gluconate Cloth  6 each Topical Q0600  . heparin  5,000 Units Subcutaneous Q8H  . mouth rinse  15 mL Mouth Rinse BID  . mupirocin ointment  1 application Nasal BID   Continuous Infusions: . sodium chloride    . sodium chloride    . dextrose 50 mL/hr at 07/25/17 1248  . dextrose 5 % and 0.9% NaCl 75 mL/hr at 07/25/17 0914  . piperacillin-tazobactam (ZOSYN)  IV     PRN Meds: Place/Maintain arterial line **AND** sodium chloride, sodium chloride, food thickener, ondansetron (ZOFRAN) IV   Vital Signs    Vitals:   07/25/17 0800 07/25/17 0845 07/25/17 1000 07/25/17 1235  BP: 110/67  121/63   Pulse:   80   Resp: 16  19   Temp:  97.6 F (36.4 C)  97.8 F (36.6 C)  TempSrc:  Oral  Oral  SpO2:   95%   Weight:      Height:        Intake/Output Summary (Last 24 hours) at 07/25/2017 1500 Last data filed at 07/25/2017 1400 Gross per 24 hour  Intake 5506.39 ml  Output 2893 ml  Net 2613.39 ml   Filed Weights   07/24/17 2327 07/25/17 0500  Weight: 198 lb 7 oz (90 kg) 223 lb 5.2 oz (101.3 kg)    Telemetry    SR - Personally Reviewed  ECG    Physical Exam   GEN: No acute distress.   Neck: Neck is full   Cardiac: RRR, no murmurs, rubs, or gallops.  Respiratory: Clear to auscultation bilaterally. GI: Soft, nontender,  MS: No edema; No deformity Neuro:  Nonfocal  Psych: Normal affect   Labs    Chemistry Recent Labs  Lab 07/19/17 0515 07/24/17 1528 07/24/17 1547 07/25/17 0247  NA 144 138 138 139  K 3.6 3.9 3.8 4.2  CL 106 105 105 111  CO2 27 21*  --  20*  GLUCOSE 90 118* 117* 103*  BUN 34* 34* 32* 32*  CREATININE 0.85  2.59* 2.60* 1.90*  CALCIUM 8.4* 8.2*  --  7.4*  PROT  --  6.2*  --  5.5*  ALBUMIN  --  2.5*  --  2.1*  AST  --  21  --  20  ALT  --  13*  --  13*  ALKPHOS  --  97  --  82  BILITOT  --  0.2*  --  0.4  GFRNONAA >60 18*  --  27*  GFRAA >60 21*  --  31*  ANIONGAP 11 12  --  8     Hematology Recent Labs  Lab 07/21/17 0530 07/24/17 1528 07/24/17 1547 07/25/17 0247  WBC 9.4 23.4*  --  16.2*  RBC 2.74* 2.96*  --  2.57*  HGB 8.0* 8.3* 8.2* 7.2*  HCT 26.1* 27.8* 24.0* 24.2*  MCV 95.3 93.9  --  94.2  MCH 29.2 28.0  --  28.0  MCHC 30.7 29.9*  --  29.8*  RDW 16.8* 17.3*  --  17.1*  PLT 255 295  --  169    Cardiac Enzymes  Recent Labs  Lab 07/25/17 0919  TROPONINI 0.17*    Recent Labs  Lab 07/24/17 1545  TROPIPOC 0.21*     BNP Recent Labs  Lab 07/24/17 2135 07/25/17 0247  BNP 684.8* 677.0*     DDimer No results for input(s): DDIMER in the last 168 hours.   Radiology    Ct Abdomen Pelvis Wo Contrast  Result Date: 07/24/2017 CLINICAL DATA:  Acute onset of lower abdominal pain, renal failure and shock. EXAM: CT ABDOMEN AND PELVIS WITHOUT CONTRAST TECHNIQUE: Multidetector CT imaging of the abdomen and pelvis was performed following the standard protocol without IV contrast. COMPARISON:  CT of the abdomen and pelvis performed 01/15/2015, and renal ultrasound performed 01/22/2015 FINDINGS: Lower chest: Small bilateral pleural effusions are noted. Patchy bibasilar airspace opacities raise concern for pneumonia. A trace pericardial effusion is noted. Hepatobiliary: A new vague 5.5 cm mass is noted at the right hepatic lobe. A smaller 3.6 cm mass is noted more superiorly. Per correlation with the patient's clinical history, this reflects known metastatic disease to the liver. Stones are seen dependently within the gallbladder. The gallbladder is otherwise unremarkable. The common bile duct remains normal in caliber. Pancreas: The pancreas is within normal limits. Spleen: A  nonspecific vague 2.9 cm hypodensity is noted at the inferior aspect of the spleen. Decreased attenuation along the medial aspect of the spleen may reflect remote infarct. Adrenals/Urinary Tract: The adrenal glands are unremarkable in appearance. Nonspecific perinephric stranding is noted bilaterally. Scattered nonobstructing bilateral renal stones are seen, measuring up to 4 mm in size. There is no evidence of hydronephrosis. No obstructing ureteral stones are identified. Stomach/Bowel: The stomach is unremarkable in appearance. The small bowel is within normal limits. The appendix is normal in caliber, without evidence of appendicitis. Scattered diverticulosis is noted along the descending and sigmoid colon, with contrast filled diverticula. The colon is otherwise unremarkable. Vascular/Lymphatic: The abdominal aorta is unremarkable in appearance. The inferior vena cava is grossly unremarkable. No retroperitoneal lymphadenopathy is seen. No pelvic sidewall lymphadenopathy is identified. Reproductive: The bladder is markedly distended, extending above the umbilicus. The uterus is somewhat compressed but otherwise unremarkable in appearance. No suspicious adnexal masses are seen. Other: A small umbilical hernia is noted, containing only fat. Musculoskeletal: No acute osseous abnormalities are identified. There is mild chronic loss of height at vertebral body T12. The visualized musculature is unremarkable in appearance. IMPRESSION: 1. Markedly distended bladder, extending above the umbilicus. This may be contributing to the patient's renal failure. Foley catheter placement would be helpful, as deemed clinically appropriate. 2. Small bilateral pleural effusions. Patchy bibasilar airspace opacities raise concern for pneumonia. 3. Hepatic masses measure up to 5.5 cm in size, reflecting known metastatic disease to the liver. 4. Vague 2.9 cm hypodensity at the inferior aspect of the spleen may also reflect metastatic  disease. 5. Nonobstructing bilateral renal stones measure up to 4 mm in size. 6. Cholelithiasis.  Gallbladder otherwise unremarkable. 7. Scattered diverticulosis along the descending and sigmoid colon, without evidence of diverticulitis. 8. Small umbilical hernia, containing only fat. 9. Trace pericardial effusion noted. These results were called by telephone at the time of interpretation on 07/24/2017 at 9:18 pm to Nursing on [id number], who verbally acknowledged these results. Electronically Signed   By: Garald Balding M.D.   On: 07/24/2017 21:25   Dg Chest Port 1 View  Result Date: 07/24/2017 CLINICAL DATA:  Unresponsive. EXAM: PORTABLE CHEST 1 VIEW COMPARISON:  07/12/2017 and CT chest 07/03/2017. FINDINGS: Trachea is midline. Heart  size stable. Mild mixed interstitial and airspace opacification. Small bilateral pleural effusions. IMPRESSION: Resolving pneumonia or edema.  Small bilateral pleural effusions. Electronically Signed   By: Lorin Picket M.D.   On: 07/24/2017 15:58    Cardiac Studies    Patient Profile     67 y.o. female with recent admit for hypotension, VF arrest, NSTEMI.   Readmitted yesterday with hypotension, poor po intake  Assessment & Plan    1  Hypotension   BP improved   Off of pressors  Still receiving IV fluids    Pt had poor intake after discharge   Follow  Echo yesterday showed normal LV function   RVEF down  Similar to previous  Follow  2  Lung CA   CT yesterday showe new lesions in liver   Bladder markedly distended   Note aorta normal      3  VF arrest  Occurred on 4/1, few days after admitted in march  Elevation in trop  Echo with normal LVEF but mild /moderate RV dysfunciton noted    Note CT of chest with no coronary calcifications  Currently on po amiodarone   No furehter recurrence    WIll review with EP amio dosing  With  No arrhythmias, CP I would continue current Rx for now.    Dorris Carnes   For questions or updates, please contact [facility] Please consult [email or web address] for contact info under Cardiology/STEMI.      Signed, Dorris Carnes, MD  07/25/2017, 3:00 PM

## 2017-07-25 NOTE — Evaluation (Signed)
Clinical/Bedside Swallow Evaluation Patient Details  Name: Jennifer Delgado MRN: [id number] Date of Birth: 1950-12-29  Today's Date: 07/25/2017 Time: SLP Start Time (ACUTE ONLY): 0941 SLP Stop Time (ACUTE ONLY): 1002 SLP Time Calculation (min) (ACUTE ONLY): 21 min  Past Medical History:  Past Medical History:  Diagnosis Date  . Brain cancer ([place])   . Cancer ([place])    lung  . Cellulitis   . Hypertension   . Liver cancer ([place])   . Renal disorder    Past Surgical History:  Past Surgical History:  Procedure Laterality Date  . BRONCHOSCOPY    . LEG SURGERY    . THORACENTESIS     HPI:  69 yoF presenting from SNF with nausea, decreased PO intake, abd discomfort since recent discharge (4/19) with AMS after being off oxygen. Pt admitted with hypovolemic shock with HTN requiring pressors, acute on chronic respiratory failure. Pt was seen during recent admission with evidence of post-extubation dysphagia, weak cough and dysphonic voice. Pt was most recently recommended to have Dys 3 diet and nectar thick liquids with use of chin tuck; thin liquids via provale cup only (MBS 4/18).    Assessment / Plan / Recommendation Clinical Impression  Pt believes she has had mild improvement in her vocal quality since recent admission, at which time she reports that she was grossly aphonic. Previous SLP notes describe her voice as breathy. Today her voice is reduced in volume and hoarse, but without obvious air leakage, all suggestive of likely improving glottal approximation. Pt however has immediate coughing concerning for aspiration that follows ice chips and thin liquids by even small, single cup sips. She needed only one verbal reminder to use her chin tuck across all liquid trials. Recommend restarting recommendations from prior MBS (Dys 3 diet, nectar thick liquids, meds crushed in puree, chin tuck) with plans for FEES this afternoon to better assess oropharyngeal function and better visualize vocal fold  integrity. SLP Visit Diagnosis: Dysphagia, oropharyngeal phase (R13.12)    Aspiration Risk  Moderate aspiration risk;Mild aspiration risk    Diet Recommendation Dysphagia 3 (Mech soft);Nectar-thick liquid   Liquid Administration via: Cup Medication Administration: Crushed with puree Supervision: Patient able to self feed;Intermittent supervision to cue for compensatory strategies Compensations: Slow rate;Small sips/bites;Effortful swallow;Chin tuck;Multiple dry swallows after each bite/sip Postural Changes: Seated upright at 90 degrees;Remain upright for at least 30 minutes after po intake    Other  Recommendations Oral Care Recommendations: Oral care BID Other Recommendations: Order thickener from pharmacy;Prohibited food (jello, ice cream, thin soups);Remove water pitcher   Follow up Recommendations        Frequency and Duration            Prognosis Prognosis for Safe Diet Advancement: Good      Swallow Study   General HPI: 63 yoF presenting from SNF with nausea, decreased PO intake, abd discomfort since recent discharge (4/19) with AMS after being off oxygen. Pt admitted with hypovolemic shock with HTN requiring pressors, acute on chronic respiratory failure. Pt was seen during recent admission with evidence of post-extubation dysphagia, weak cough and dysphonic voice. Pt was most recently recommended to have Dys 3 diet and nectar thick liquids with use of chin tuck; thin liquids via provale cup only (MBS 4/18).  Type of Study: Bedside Swallow Evaluation Previous Swallow Assessment: see HPI Diet Prior to this Study: Dysphagia 3 (soft);Thin liquids Temperature Spikes Noted: No Respiratory Status: Nasal cannula History of Recent Intubation: No(12 day intubation during recent admission though) Behavior/Cognition:  Alert;Cooperative;Pleasant mood Oral Cavity Assessment: Within Functional Limits Oral Care Completed by SLP: Recent completion by staff Oral Cavity - Dentition:  Adequate natural dentition Vision: Functional for self-feeding Self-Feeding Abilities: Able to feed self Patient Positioning: Upright in bed Baseline Vocal Quality: Hoarse;Low vocal intensity Volitional Cough: Weak Volitional Swallow: Able to elicit    Oral/Motor/Sensory Function Overall Oral Motor/Sensory Function: Within functional limits   Ice Chips Ice chips: Impaired Presentation: Spoon Pharyngeal Phase Impairments: Cough - Immediate   Thin Liquid Thin Liquid: Impaired Presentation: Cup;Self Fed(uses chin tuck) Pharyngeal  Phase Impairments: Cough - Immediate    Nectar Thick Nectar Thick Liquid: Within functional limits Presentation: Cup;Self Fed(uses chin tuck )   Honey Thick Honey Thick Liquid: Not tested   Puree Puree: Within functional limits Presentation: Self Fed;Spoon   Solid   GO   Solid: Within functional limits Presentation: Self Ennis Forts 07/25/2017,10:22 AM  Germain Osgood, M.A. CCC-SLP [phone]

## 2017-07-26 LAB — GLUCOSE, CAPILLARY
GLUCOSE-CAPILLARY: 82 mg/dL (ref 65–99)
GLUCOSE-CAPILLARY: 94 mg/dL (ref 65–99)
Glucose-Capillary: 114 mg/dL — ABNORMAL HIGH (ref 65–99)
Glucose-Capillary: 67 mg/dL (ref 65–99)
Glucose-Capillary: 72 mg/dL (ref 65–99)
Glucose-Capillary: 87 mg/dL (ref 65–99)

## 2017-07-26 LAB — URINE CULTURE

## 2017-07-26 MED ORDER — HYDROCODONE-ACETAMINOPHEN 10-325 MG PO TABS
1.0000 | ORAL_TABLET | Freq: Four times a day (QID) | ORAL | Status: DC | PRN
  Administered 2017-07-26 – 2017-08-03 (×9): 1 via ORAL
  Filled 2017-07-26 (×9): qty 1

## 2017-07-26 MED ORDER — RESOURCE THICKENUP CLEAR PO POWD
ORAL | Status: DC | PRN
  Filled 2017-07-26: qty 125

## 2017-07-26 MED ORDER — ALBUTEROL SULFATE (2.5 MG/3ML) 0.083% IN NEBU: 2.5000 mg | INHALATION_SOLUTION | RESPIRATORY_TRACT | Status: DC | PRN

## 2017-07-26 NOTE — Progress Notes (Signed)
  Speech Language Pathology Treatment: Dysphagia  Patient Details Name: Jennifer Delgado MRN: [id number] DOB: July 28, 1950 Today's Date: 07/26/2017 Time: [id number] SLP Time Calculation (min) (ACUTE ONLY): 15 min  Assessment / Plan / Recommendation Clinical Impression  Pt presents with initial coughing with thin liquid trials, despite use of chin tuck. Use of a straw and Min verbal/visual cues for adequate positioning reduced but did not eliminate coughing with subsequent water trials. Pt had additional coughing at the end of the session with nectar thick liquids following solid trials. Pt reported coughing throughout the day and during meals, and that she feels very tired when eating. Overall she looks more deconditioned today than on previous date. SLP provided education on need to take breaks when eating when short of breath and when fatigued, with pt verbalizing understanding. Continue to recommend nectar thick liquids and Dysphagia 3 (mech soft) diet with chin tuck for all liquids, aspiration precautions (HOB elevated, pt alert for intake, slow/small bites), medications crushed in puree, and intermittent supervision. Sips of water allowed after oral care with chin tuck unless coughing with intake. SLP will continue to follow to determine safety with diet/ability to advance textures.    HPI HPI: 26 yoF presenting from SNF with nausea, decreased PO intake, abd discomfort since recent discharge (4/19) with AMS after being off oxygen. Pt admitted with hypovolemic shock with HTN requiring pressors, acute on chronic respiratory failure. Pt was seen during recent admission with evidence of post-extubation dysphagia, weak cough and dysphonic voice. Pt was most recently recommended to have Dys 3 diet and nectar thick liquids with use of chin tuck; thin liquids via provale cup only (MBS 4/18).       SLP Plan  Continue with current plan of care       Recommendations  Diet recommendations: Nectar-thick  liquid;Dysphagia 3 (mechanical soft) Liquids provided via: Straw Medication Administration: Crushed with puree Supervision: Patient able to self feed;Intermittent supervision to cue for compensatory strategies Compensations: Slow rate;Small sips/bites;Chin tuck Postural Changes and/or Swallow Maneuvers: Seated upright 90 degrees;Chin tuck                Oral Care Recommendations: Oral care BID;Oral care prior to ice chip/H20 Follow up Recommendations: Skilled Nursing facility SLP Visit Diagnosis: Dysphagia, oropharyngeal phase (R13.12) Plan: Continue with current plan of care       GO              [place] Bryon Parker SLP Student Clinician   [place] Earlyne Feeser 07/26/2017, 12:45 PM

## 2017-07-26 NOTE — Progress Notes (Signed)
RT to bedside for consult per MD. Pt c/o hoarseness, constant cough, and possibly mucous that is "stuck". Pt with clear BS bilaterally, no increased WOB, VS within normal limits. Humidity added to [place] to help with dry throat. HHN not needed at this time. Pt could possibly benefit from a cough medicine or something like a mucinex. HHN to remain PRN now. RT will continue to monitor pt.

## 2017-07-26 NOTE — Progress Notes (Addendum)
Progress Note  Patient Name: Jennifer Delgado Date of Encounter: 07/26/2017  Primary Cardiologist: Sanda Klein, MD   Subjective   Pt complains of hoarseness and requests breathing treatments, which helped her in the past. She is feeling better today. Right hand and arm red and wam  Inpatient Medications    Scheduled Meds: . amiodarone  400 mg Oral Daily  . aspirin  81 mg Oral Daily  . atorvastatin  40 mg Oral q1800  . feeding supplement (ENSURE ENLIVE)  237 mL Oral Q1500  . feeding supplement (PRO-STAT SUGAR FREE 64)  30 mL Oral Daily  . heparin  5,000 Units Subcutaneous Q8H  . Lorlatinib  100 mg Oral Daily   Continuous Infusions: . sodium chloride    . dextrose 50 mL/hr at 07/25/17 1248  . dextrose 5 % and 0.9% NaCl 75 mL/hr at 07/26/17 0049  . piperacillin-tazobactam (ZOSYN)  IV Stopped (07/26/17 0455)   PRN Meds: sodium chloride, food thickener, ondansetron (ZOFRAN) IV, RESOURCE THICKENUP CLEAR   Vital Signs    Vitals:   07/25/17 2025 07/26/17 0145 07/26/17 0446 07/26/17 0459  BP: (!) 107/48 (!) 123/54  131/66  Pulse: 85 89  92  Resp:  20    Temp: 97.6 F (36.4 C) 97.6 F (36.4 C)  98.5 F (36.9 C)  TempSrc: Oral Oral  Oral  SpO2:  93%  93%  Weight:   238 lb 8.6 oz (108.2 kg)   Height:        Intake/Output Summary (Last 24 hours) at 07/26/2017 1013 Last data filed at 07/26/2017 0649 Gross per 24 hour  Intake 2992.08 ml  Output 830 ml  Net 2162.08 ml   Filed Weights   07/25/17 0500 07/25/17 1511 07/26/17 0446  Weight: 223 lb 5.2 oz (101.3 kg) 231 lb 7.7 oz (105 kg) 238 lb 8.6 oz (108.2 kg)    Telemetry    sinus - Personally Reviewed  ECG    No new tracings - Personally Reviewed  Physical Exam   GEN: No acute distress.   Neck: No JVD Cardiac: RRR, no murmurs, rubs, or gallops.  Respiratory: Clear to auscultation bilaterally. GI: Soft, nontender, non-distended  MS: left LE swelling Neuro:  Nonfocal  Psych: Normal affect   Labs      Chemistry Recent Labs  Lab 07/24/17 1528 07/24/17 1547 07/25/17 0247  NA 138 138 139  K 3.9 3.8 4.2  CL 105 105 111  CO2 21*  --  20*  GLUCOSE 118* 117* 103*  BUN 34* 32* 32*  CREATININE 2.59* 2.60* 1.90*  CALCIUM 8.2*  --  7.4*  PROT 6.2*  --  5.5*  ALBUMIN 2.5*  --  2.1*  AST 21  --  20  ALT 13*  --  13*  ALKPHOS 97  --  82  BILITOT 0.2*  --  0.4  GFRNONAA 18*  --  27*  GFRAA 21*  --  31*  ANIONGAP 12  --  8     Hematology Recent Labs  Lab 07/21/17 0530 07/24/17 1528 07/24/17 1547 07/25/17 0247  WBC 9.4 23.4*  --  16.2*  RBC 2.74* 2.96*  --  2.57*  HGB 8.0* 8.3* 8.2* 7.2*  HCT 26.1* 27.8* 24.0* 24.2*  MCV 95.3 93.9  --  94.2  MCH 29.2 28.0  --  28.0  MCHC 30.7 29.9*  --  29.8*  RDW 16.8* 17.3*  --  17.1*  PLT 255 295  --  169    Cardiac  Enzymes Recent Labs  Lab 07/25/17 0919  TROPONINI 0.17*    Recent Labs  Lab 07/24/17 1545  TROPIPOC 0.21*     BNP Recent Labs  Lab 07/24/17 2135 07/25/17 0247  BNP 684.8* 677.0*     DDimer No results for input(s): DDIMER in the last 168 hours.   Radiology    Ct Abdomen Pelvis Wo Contrast  Result Date: 07/24/2017 CLINICAL DATA:  Acute onset of lower abdominal pain, renal failure and shock. EXAM: CT ABDOMEN AND PELVIS WITHOUT CONTRAST TECHNIQUE: Multidetector CT imaging of the abdomen and pelvis was performed following the standard protocol without IV contrast. COMPARISON:  CT of the abdomen and pelvis performed 01/15/2015, and renal ultrasound performed 01/22/2015 FINDINGS: Lower chest: Small bilateral pleural effusions are noted. Patchy bibasilar airspace opacities raise concern for pneumonia. A trace pericardial effusion is noted. Hepatobiliary: A new vague 5.5 cm mass is noted at the right hepatic lobe. A smaller 3.6 cm mass is noted more superiorly. Per correlation with the patient's clinical history, this reflects known metastatic disease to the liver. Stones are seen dependently within the gallbladder. The  gallbladder is otherwise unremarkable. The common bile duct remains normal in caliber. Pancreas: The pancreas is within normal limits. Spleen: A nonspecific vague 2.9 cm hypodensity is noted at the inferior aspect of the spleen. Decreased attenuation along the medial aspect of the spleen may reflect remote infarct. Adrenals/Urinary Tract: The adrenal glands are unremarkable in appearance. Nonspecific perinephric stranding is noted bilaterally. Scattered nonobstructing bilateral renal stones are seen, measuring up to 4 mm in size. There is no evidence of hydronephrosis. No obstructing ureteral stones are identified. Stomach/Bowel: The stomach is unremarkable in appearance. The small bowel is within normal limits. The appendix is normal in caliber, without evidence of appendicitis. Scattered diverticulosis is noted along the descending and sigmoid colon, with contrast filled diverticula. The colon is otherwise unremarkable. Vascular/Lymphatic: The abdominal aorta is unremarkable in appearance. The inferior vena cava is grossly unremarkable. No retroperitoneal lymphadenopathy is seen. No pelvic sidewall lymphadenopathy is identified. Reproductive: The bladder is markedly distended, extending above the umbilicus. The uterus is somewhat compressed but otherwise unremarkable in appearance. No suspicious adnexal masses are seen. Other: A small umbilical hernia is noted, containing only fat. Musculoskeletal: No acute osseous abnormalities are identified. There is mild chronic loss of height at vertebral body T12. The visualized musculature is unremarkable in appearance. IMPRESSION: 1. Markedly distended bladder, extending above the umbilicus. This may be contributing to the patient's renal failure. Foley catheter placement would be helpful, as deemed clinically appropriate. 2. Small bilateral pleural effusions. Patchy bibasilar airspace opacities raise concern for pneumonia. 3. Hepatic masses measure up to 5.5 cm in size,  reflecting known metastatic disease to the liver. 4. Vague 2.9 cm hypodensity at the inferior aspect of the spleen may also reflect metastatic disease. 5. Nonobstructing bilateral renal stones measure up to 4 mm in size. 6. Cholelithiasis.  Gallbladder otherwise unremarkable. 7. Scattered diverticulosis along the descending and sigmoid colon, without evidence of diverticulitis. 8. Small umbilical hernia, containing only fat. 9. Trace pericardial effusion noted. These results were called by telephone at the time of interpretation on 07/24/2017 at 9:18 pm to Nursing on [id number], who verbally acknowledged these results. Electronically Signed   By: Garald Balding M.D.   On: 07/24/2017 21:25   Dg Chest Port 1 View  Result Date: 07/24/2017 CLINICAL DATA:  Unresponsive. EXAM: PORTABLE CHEST 1 VIEW COMPARISON:  07/12/2017 and CT chest 07/03/2017. FINDINGS: Trachea is midline.  Heart size stable. Mild mixed interstitial and airspace opacification. Small bilateral pleural effusions. IMPRESSION: Resolving pneumonia or edema.  Small bilateral pleural effusions. Electronically Signed   By: Lorin Picket M.D.   On: 07/24/2017 15:58    Cardiac Studies   Echo 07/03/17: Study Conclusions - Left ventricle: The cavity size was normal. Wall thickness was   increased in a pattern of moderate LVH. Systolic function was   normal. The estimated ejection fraction was in the range of 60%   to 65%. Wall motion was normal; there were no regional wall   motion abnormalities. Doppler parameters are consistent with   abnormal left ventricular relaxation (grade 1 diastolic   dysfunction). - Right ventricle: The cavity size was mildly dilated. Systolic   function was mildly to moderately reduced. - Pericardium, extracardiac: A small pericardial effusion was   identified. There was no evidence of hemodynamic compromise.   Features were not consistent with tamponade physiology.  Patient Profile     67 y.o. female with a hx of  recent VF arrest, NSTEMI/STEMI  who is being seen for the evaluation of N/V, hypotension and EKG changes.  Assessment & Plan    1. Hypotension - off pressors - IVF running - pressure is much improved with systolic in the 322G - hypotension may be related to her poor PO intake - try to wean off fluids today  2. Hx of VF arrest on 07/03/17, discharged to SNF on 07/21/17 - troponin 0.21 --> 0.17 - EKG with TWI diffusely, may be related to decreased RV function - will not pursue ischemic evaluation given her metastatic cancer  3. Renal function - sCr improving: 1.90, from 2.59 - follow daily labs    For questions or updates, please contact [place] Please consult [email or web address] for contact info under Cardiology/STEMI.      Signed, Tami Lin Duke, PA  07/26/2017, 10:13 AM    Pt seen and examined  Working with speech therapy for swallowig Exam:  COmfortable   Neck  JVP nromal   Lungs are CTA  Cardiac RRR   No S3  Ext with tr edema  Cr still remains elevated compare to early last admittt Would continue IV hdration for now until renal function improves   Follow exam Continue medical Rx   Keep on amiodarone.  Dorris Carnes

## 2017-07-26 NOTE — Progress Notes (Signed)
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 She was recently admitted 3/28 -4/19 for pneumonia and hyponatremia.  During this admission she developed V. fib arrest due to STEMI that was treated medically. She was admitted 4/22 for acute encephalopathy after removing her oxygen, found to be hypotensive transiently requiring pressors but responded to fluids.  She was also noted to have acute renal failure. She has chronic dysphagia and vocal cord dysfunction following prior intubation. CT abdomen showed distended bladder and nonobstructive calculi with liver metastases.  She has left lower extremity wound which does not appear to be infected.   Assessment & Plan:   Active Problems:   Hypotensive shock (HCC) - Likely due to intravascular volume depletion - Off pressor support  Chronic wounds to left anterior and posterior calf related to lipodermatosclerosis - Pt uses Drawtex dressings to absorb drainage, which are not available in the [facility]. She uses Surepress for compression wrapswhich arealso not available. - Wound type:Multiple full thickness wounds Measurement:Affectedpatchyarea to anterior and posterior legs are 9X4X.2cm, 3X2X.2cm and 15X6X.2cm, seperated by narrow skin bridges. - Bedside nurse to change dressings Q day. Mepitel contact layers to protect skin graft sites from adherence of dressing and decrease discomfort with dressing changes. Substitute Aquacel for Drawtex to provide antimicrobial benefits and absorb drainage. ABD pads and kerlex, then ace wrap for light compression, since Surepress is not available.   H/O V fib arrest 07/03/17 - No further ischemic w/u given pt history of metastatic ca per cardio  AKI - Due to hypotension - Cr improving   DVT prophylaxis: Heparin subQ Code  Status: full code  Family Communication: no family at bedside this am Disposition Plan: home versus SNF once stable   Consultants:   Cardiology   WOC  Procedures:   None  Antimicrobials:   Zosyn 4/22 -->   Subjective: No overnight events.  Objective: Vitals:   07/26/17 0446 07/26/17 0459 07/26/17 1300 07/26/17 2105  BP:  131/66 136/64 129/64  Pulse:  92 (!) 108 99  Resp:   18 18  Temp:  98.5 F (36.9 C) 97.8 F (36.6 C) (!) 97.3 F (36.3 C)  TempSrc:  Oral Oral Oral  SpO2:  93% 100% 100%  Weight: 108.2 kg (238 lb 8.6 oz)     Height:        Intake/Output Summary (Last 24 hours) at 07/26/2017 2239 Last data filed at 07/26/2017 2145 Gross per 24 hour  Intake 3375.83 ml  Output 2001 ml  Net 1374.83 ml   Filed Weights   07/25/17 0500 07/25/17 1511 07/26/17 0446  Weight: 101.3 kg (223 lb 5.2 oz) 105 kg (231 lb 7.7 oz) 108.2 kg (238 lb 8.6 oz)    Examination:  General exam: Appears calm and comfortable  Respiratory system: Clear to auscultation. Respiratory effort normal. Cardiovascular system: S1 & S2 heard, RRR. Gastrointestinal system: Abdomen is nondistended, soft and nontender. No organomegaly or masses felt. Normal bowel sounds heard. Central nervous system: Alert and oriented. No focal neurological deficits. Extremities: Symmetric 5 x 5 power. Skin: warm, dry  Psychiatry: Judgement and insight appear normal. Mood & affect appropriate.   Data Reviewed: I have personally reviewed following labs and imaging studies  CBC: Recent Labs  Lab 07/20/17 0459 07/21/17 0530 07/24/17 1528 07/24/17 1547 07/25/17 0247  WBC 9.0 9.4 23.4*  --  16.2*  NEUTROABS 7.8* 7.6 19.7*  --   --   HGB 8.0* 8.0* 8.3* 8.2* 7.2*  HCT 26.1* 26.1* 27.8* 24.0* 24.2*  MCV 96.7 95.3 93.9  --  94.2  PLT 259 255 295  --  098   Basic Metabolic Panel: Recent Labs  Lab 07/24/17 1528 07/24/17 1547 07/24/17 2134 07/25/17 0247  NA 138 138  --  139  K 3.9 3.8  --  4.2  CL 105  105  --  111  CO2 21*  --   --  20*  GLUCOSE 118* 117*  --  103*  BUN 34* 32*  --  32*  CREATININE 2.59* 2.60*  --  1.90*  CALCIUM 8.2*  --   --  7.4*  MG  --   --  1.8 1.8  PHOS  --   --  4.6 4.8*   GFR: Estimated Creatinine Clearance: 35.5 mL/min (A) (by C-G formula based on SCr of 1.9 mg/dL (H)). Liver Function Tests: Recent Labs  Lab 07/24/17 1528 07/25/17 0247  AST 21 20  ALT 13* 13*  ALKPHOS 97 82  BILITOT 0.2* 0.4  PROT 6.2* 5.5*  ALBUMIN 2.5* 2.1*   Recent Labs  Lab 07/24/17 1528  LIPASE 66*   No results for input(s): AMMONIA in the last 168 hours. Coagulation Profile: Recent Labs  Lab 07/24/17 1528  INR 2.05   Cardiac Enzymes: Recent Labs  Lab 07/25/17 0919  TROPONINI 0.17*   BNP (last 3 results) No results for input(s): PROBNP in the last 8760 hours. HbA1C: No results for input(s): HGBA1C in the last 72 hours. CBG: Recent Labs  Lab 07/26/17 0420 07/26/17 0831 07/26/17 1259 07/26/17 1706 07/26/17 2115  GLUCAP 87 82 94 67 72   Lipid Profile: No results for input(s): CHOL, HDL, LDLCALC, TRIG, CHOLHDL, LDLDIRECT in the last 72 hours. Thyroid Function Tests: Recent Labs    07/25/17 0247  TSH 2.814   Anemia Panel: No results for input(s): VITAMINB12, FOLATE, FERRITIN, TIBC, IRON, RETICCTPCT in the last 72 hours. Urine analysis:    Component Value Date/Time   COLORURINE AMBER (A) 07/24/2017 2143   APPEARANCEUR CLOUDY (A) 07/24/2017 2143   LABSPEC 1.015 07/24/2017 2143   PHURINE 6.0 07/24/2017 2143   GLUCOSEU NEGATIVE 07/24/2017 2143   HGBUR MODERATE (A) 07/24/2017 2143   BILIRUBINUR NEGATIVE 07/24/2017 2143   [place] NEGATIVE 07/24/2017 2143   PROTEINUR 30 (A) 07/24/2017 2143   NITRITE NEGATIVE 07/24/2017 2143   LEUKOCYTESUR LARGE (A) 07/24/2017 2143   Sepsis Labs: @LABRCNTIP (procalcitonin:4,lacticidven:4)   Recent Results (from the past 240 hour(s))  C difficile quick scan w PCR reflex     Status: None   Collection Time:  07/19/17  6:00 PM  Result Value Ref Range Status   C Diff antigen NEGATIVE NEGATIVE Final   C Diff toxin NEGATIVE NEGATIVE Final   C Diff interpretation No C. difficile detected.  Final    Comment: Performed at [facility], [place] [address] [place], [place] [address]  Culture, blood (routine x 2)     Status: None (Preliminary result)   Collection Time: 07/24/17  2:56 PM  Result Value Ref Range Status   Specimen Description BLOOD RIGHT NECK  Final   Special Requests   Final    BOTTLES DRAWN AEROBIC AND ANAEROBIC Blood Culture adequate volume   Culture   Final    NO GROWTH 2 DAYS Performed at [facility], [place] [address] [place], [place] [address]  Report Status PENDING  Incomplete  Culture, blood (routine x 2)     Status: None (Preliminary result)   Collection Time: 07/24/17  3:01 PM  Result Value Ref Range Status   Specimen Description BLOOD LEFT FOREARM  Final   Special Requests   Final    BOTTLES DRAWN AEROBIC AND ANAEROBIC Blood Culture adequate volume   Culture   Final    NO GROWTH 2 DAYS Performed at [facility], [address], [place], [place] [address]    Report Status PENDING  Incomplete  Culture, Urine     Status: Abnormal   Collection Time: 07/25/17 10:40 AM  Result Value Ref Range Status   Specimen Description URINE, CATHETERIZED  Final   Special Requests   Final    NONE Performed at [facility], [place] [address] [place], [place] [address]    Culture MULTIPLE SPECIES PRESENT, SUGGEST RECOLLECTION (A)  Final   Report Status 07/26/2017 FINAL  Final     Radiology Studies: Ct Abdomen Pelvis Wo Contrast Result Date: 07/24/2017 1. Markedly distended bladder, extending above the umbilicus. This may be contributing to the patient's renal failure. Foley catheter placement would be helpful, as deemed clinically appropriate. 2. Small bilateral pleural effusions. Patchy bibasilar airspace opacities raise concern for pneumonia. 3.  Hepatic masses measure up to 5.5 cm in size, reflecting known metastatic disease to the liver. 4. Vague 2.9 cm hypodensity at the inferior aspect of the spleen may also reflect metastatic disease. 5. Nonobstructing bilateral renal stones measure up to 4 mm in size. 6. Cholelithiasis.  Gallbladder otherwise unremarkable. 7. Scattered diverticulosis along the descending and sigmoid colon, without evidence of diverticulitis. 8. Small umbilical hernia, containing only fat. 9. Trace pericardial effusion noted.  Dg Chest Port 1 View Result Date: 07/24/2017 Resolving pneumonia or edema.  Small bilateral pleural effusions.   Scheduled Meds: . amiodarone  400 mg Oral Daily  . aspirin  81 mg Oral Daily  . atorvastatin  40 mg Oral q1800  . feeding supplement  237 mL Oral Q1500  . feeding supplement   30 mL Oral Daily  . heparin  5,000 Units Subcutaneous Q8H  . Lorlatinib  100 mg Oral Daily   Continuous Infusions: . dextrose 5 % and 0.9% NaCl 75 mL/hr at 07/26/17 0049  . piperacillin-tazobactam (ZOSYN)  IV 3.375 g (07/26/17 2145)     LOS: 2 days    Time spent: 25 minutes Greater than 50% of the time spent on counseling and coordinating the care.   Leisa Lenz, MD [facility] Pager [phone]  If 7PM-7AM, please contact night-coverage [email or web address] Password [facility] 07/26/2017, 10:39 PM

## 2017-07-27 ENCOUNTER — Inpatient Hospital Stay (HOSPITAL_COMMUNITY): Payer: BLUE CROSS/BLUE SHIELD

## 2017-07-27 DIAGNOSIS — M7989 Other specified soft tissue disorders: Secondary | ICD-10-CM

## 2017-07-27 LAB — GLUCOSE, CAPILLARY
GLUCOSE-CAPILLARY: 113 mg/dL — AB (ref 65–99)
GLUCOSE-CAPILLARY: 78 mg/dL (ref 65–99)
GLUCOSE-CAPILLARY: 96 mg/dL (ref 65–99)
Glucose-Capillary: 87 mg/dL (ref 65–99)
Glucose-Capillary: 87 mg/dL (ref 65–99)
Glucose-Capillary: 78 mg/dL (ref 65–99)

## 2017-07-27 LAB — BASIC METABOLIC PANEL
Anion gap: 8 (ref 5–15)
BUN: 15 mg/dL (ref 6–20)
CHLORIDE: 109 mmol/L (ref 101–111)
CO2: 20 mmol/L — ABNORMAL LOW (ref 22–32)
CREATININE: 0.88 mg/dL (ref 0.44–1.00)
Calcium: 7.9 mg/dL — ABNORMAL LOW (ref 8.9–10.3)
GFR calc non Af Amer: >60 mL/min (ref >60)
Glucose, Bld: 98 mg/dL (ref 65–99)
POTASSIUM: 3.1 mmol/L — AB (ref 3.5–5.1)
Sodium: 137 mmol/L (ref 135–145)

## 2017-07-27 LAB — CBC
HEMATOCRIT: 25.3 % — AB (ref 36.0–46.0)
HEMOGLOBIN: 8 g/dL — AB (ref 12.0–15.0)
MCH: 29.1 pg (ref 26.0–34.0)
MCHC: 31.6 g/dL (ref 30.0–36.0)
MCV: 92 fL (ref 78.0–100.0)
Platelets: 180 10*3/uL (ref 150–400)
RBC: 2.75 MIL/uL — AB (ref 3.87–5.11)
RDW: 16.6 % — ABNORMAL HIGH (ref 11.5–15.5)
WBC: 10.1 10*3/uL (ref 4.0–10.5)

## 2017-07-27 MED ORDER — POTASSIUM CHLORIDE 10 MEQ/100ML IV SOLN
10.0000 meq | INTRAVENOUS | Status: AC
  Administered 2017-07-27 (×4): 10 meq via INTRAVENOUS
  Filled 2017-07-27 (×4): qty 100

## 2017-07-27 MED ORDER — FUROSEMIDE 10 MG/ML IJ SOLN
60.0000 mg | Freq: Once | INTRAMUSCULAR | Status: AC
  Administered 2017-07-27: 60 mg via INTRAVENOUS
  Filled 2017-07-27: qty 6

## 2017-07-27 MED ORDER — CEFAZOLIN SODIUM-DEXTROSE 2-4 GM/100ML-% IV SOLN
2.0000 g | Freq: Three times a day (TID) | INTRAVENOUS | Status: DC
  Filled 2017-07-27: qty 100

## 2017-07-27 MED ORDER — SODIUM CHLORIDE 0.9 % IV SOLN
3.0000 g | Freq: Four times a day (QID) | INTRAVENOUS | Status: DC
  Administered 2017-07-27 – 2017-07-31 (×15): 3 g via INTRAVENOUS
  Filled 2017-07-27 (×17): qty 3

## 2017-07-27 NOTE — Progress Notes (Signed)
Pt states potassium IVPB is burning her arm. RN lowered rate to 50 mL/h.

## 2017-07-27 NOTE — Evaluation (Signed)
Physical Therapy Evaluation Patient Details Name: Jennifer Delgado MRN: [id number] DOB: 1950-09-05 Today's Date: 07/27/2017   History of Present Illness  67 yo admitted 4/22 for acute encephalopathy after removing her oxygen, found to be hypotensive transiently requiring pressors, with acute renal failure. PMHx: lung CA with mets to brin and liver, STEMI, vocal cord dysfunction  Clinical Impression  Pt pleasant with flat affect and decreased mobility since last hospitalization. Pt has been unable to sit or stand without 2 person assist and demonstrates general deconditioning, impaired balance, cognition and functional mobility who will benefit from acute therapy to maximize mobility, safety and function to decrease burden of care. Recommend lift for OOB to chair with nursing assist. Pt may be able to complete lateral scoot transfer with drop arm next session.      Follow Up Recommendations SNF;Supervision/Assistance - 24 hour    Equipment Recommendations  None recommended by PT    Recommendations for Other Services       Precautions / Restrictions Precautions Precautions: Fall Restrictions Weight Bearing Restrictions: No      Mobility  Bed Mobility   Bed Mobility: Supine to Sit;Sit to Supine     Supine to sit: Mod assist;+2 for physical assistance;HOB elevated Sit to supine: Max assist;+2 for physical assistance   General bed mobility comments: mod +2 assist to pivot to right side of bed with rail, pad, increased time and cues to sequence. Assist to fully move legs off of bed and elevate trunk with HOb 30degrees. Return to bed max +2 assist for pivot back to bed and slide toward [facility] total assist +2  Transfers Overall transfer level: Needs assistance   Transfers: Lateral/Scoot Transfers           General transfer comment: initiated lateral scoot transfer with pt able to perform a few scoots however noted soiled pad under pt, unable to change in sitting and required return to  bed  Ambulation/Gait             General Gait Details: unable at this time  Stairs            Wheelchair Mobility    Modified Rankin (Stroke Patients Only)       Balance Overall balance assessment: Needs assistance Sitting-balance support: Bilateral upper extremity supported;Feet supported Sitting balance-Leahy Scale: Poor Sitting balance - Comments: posterior LOB with min-mod assist for sitting balance with cues for weight shift Postural control: Posterior lean                                   Pertinent Vitals/Pain Pain Score: 5  Pain Location: RUE Pain Descriptors / Indicators: Aching;Sore Pain Intervention(s): Limited activity within patient's tolerance;Repositioned;Monitored during session    [place] expects to be discharged to:: Skilled nursing facility Living Arrangements: Spouse/significant other Available Help at Discharge: Family;Available PRN/intermittently Type of Home: House Home Access: Stairs to enter     Home Layout: Able to live on main level with bedroom/bathroom;Two level Home Equipment: Walker - 4 wheels      Prior Function           Comments: walked with RW, has been sponge bathing independently prior to last admission, has required 2 person assist since last hospitalization and unable to stand     Hand Dominance        Extremity/Trunk Assessment   Upper Extremity Assessment Upper Extremity Assessment: Defer to OT evaluation  Lower Extremity Assessment Lower Extremity Assessment: Generalized weakness RLE Deficits / Details: grossly 2/5 did not formally assess LLE Deficits / Details: grossly 2/5 did not formally assess    Cervical / Trunk Assessment Cervical / Trunk Assessment: Other exceptions Cervical / Trunk Exceptions: rounded shoulders, forward head, body habitus limiting rOM  Communication   Communication: No difficulties  Cognition Arousal/Alertness: Awake/alert Behavior  During Therapy: Flat affect Overall Cognitive Status: Impaired/Different from baseline Area of Impairment: Following commands;Problem solving                       Following Commands: Follows one step commands with increased time Safety/Judgement: Decreased awareness of safety;Decreased awareness of deficits   Problem Solving: Decreased initiation;Difficulty sequencing;Requires verbal cues;Requires tactile cues;Slow processing General Comments: pt with increased time to follow commands, posterior LOB in sitting with cues for midline and slow to respond to tasks      General Comments      Exercises     Assessment/Plan    PT Assessment Patient needs continued PT services  PT Problem List Decreased strength;Decreased activity tolerance;Decreased balance;Decreased mobility;Decreased range of motion;Decreased safety awareness;Decreased cognition;Cardiopulmonary status limiting activity;Pain       PT Treatment Interventions DME instruction;Gait training;Therapeutic activities;Functional mobility training;Therapeutic exercise;Balance training;Patient/family education;Cognitive remediation    PT Goals (Current goals can be found in the Care Plan section)  Acute Rehab PT Goals Patient Stated Goal: to be able to walk and return home PT Goal Formulation: With patient Time For Goal Achievement: 08/10/17 Potential to Achieve Goals: Fair    Frequency Min 3X/week   Barriers to discharge Decreased caregiver support      Co-evaluation PT/OT/SLP Co-Evaluation/Treatment: Yes Reason for Co-Treatment: Complexity of the patient's impairments (multi-system involvement);For patient/therapist safety PT goals addressed during session: Mobility/safety with mobility;Balance         AM-PAC PT "6 Clicks" Daily Activity  Outcome Measure Difficulty turning over in bed (including adjusting bedclothes, sheets and blankets)?: Unable Difficulty moving from lying on back to sitting on the side  of the bed? : Unable Difficulty sitting down on and standing up from a chair with arms (e.g., wheelchair, bedside commode, etc,.)?: Unable Help needed moving to and from a bed to chair (including a wheelchair)?: Total Help needed walking in hospital room?: Total Help needed climbing 3-5 steps with a railing? : Total 6 Click Score: 6    End of Session   Activity Tolerance: Patient tolerated treatment well Patient left: in bed;with call bell/phone within reach;with bed alarm set Nurse Communication: Mobility status;Need for lift equipment PT Visit Diagnosis: Difficulty in walking, not elsewhere classified (R26.2);Other symptoms and signs involving the nervous system (R29.898);Muscle weakness (generalized) (M62.81);Other abnormalities of gait and mobility (R26.89)    Time: 8676-7209 PT Time Calculation (min) (ACUTE ONLY): 21 min   Charges:   PT Evaluation $PT Eval Moderate Complexity: 1 Mod     PT G Codes:        Elwyn Reach, PT [phone]   Vesna Kable B Shuntae Herzig 07/27/2017, 1:50 PM

## 2017-07-27 NOTE — Progress Notes (Signed)
RN paged MD with venous duplex results.

## 2017-07-27 NOTE — Progress Notes (Addendum)
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 She was recently admitted 3/28 -4/19 for pneumonia and hyponatremia.  During this admission she developed V. fib arrest due to STEMI that was treated medically. She was admitted 4/22 for acute encephalopathy after removing her oxygen, found to be hypotensive transiently requiring pressors but responded to fluids.  She was also noted to have acute renal failure. She has chronic dysphagia and vocal cord dysfunction following prior intubation. CT abdomen showed distended bladder and nonobstructive calculi with liver metastases.  She has left lower extremity wound which does not appear to be infected.   Assessment & Plan:   Active Problems:   Hypotensive shock (HCC) - Likely due to intravascular volume depletion - Off pressor support - BP stable   Chronic wounds to left anterior and posterior calf related to lipodermatosclerosis - Pt uses Drawtex dressings to absorb drainage, which are not available in the [place]. She uses Surepress for compression wrapswhich arealso not available. - Wound type:Multiple full thickness wounds Measurement:Affectedpatchyarea to anterior and posterior legs are 9X4X.2cm, 3X2X.2cm and 15X6X.2cm, seperated by narrow skin bridges. - Bedside nurse to change dressings Q day. Mepitel contact layers to protect skin graft sites from adherence of dressing and decrease discomfort with dressing changes. Substitute Aquacel for Drawtex to provide antimicrobial benefits and absorb drainage. ABD pads and kerlex, then ace wrap for light compression, since Surepress is not available.   Right upper extremity erythema and swelling - Obtain doppler study - Add Unasyn  Aspiration pneumonitis - Stop zosyn and add Unasyn which will cover for aspiration  nad cellulitis   H/O V fib arrest 07/03/17 - No further ischemic w/u given pt history of metastatic ca per cardio - Continue amiodarone   AKI - Due to hypotension - Cr is wnl  Hypokalemia - Supplemented  Lung adenocarcinoma - Per oncology  Anemia of chronic disease - Due to malignancy - Hgb 8 this am  Moderate protein calorie malnutrition - In the context of chronic illness - Continue nutritional supplementation      DVT prophylaxis: Heparin subQ Code Status: full code  Family Communication: no family at the bedside Disposition Plan: we need to obtain RUE doppler to rule out DVT   Consultants:   Cardiology   WOC  SLP - eval 4/24 - dysphagia 3 diet  Procedures:   None  Antimicrobials:   Zosyn 4/22 --> 4/25  Unasyn 4/25 -->   Subjective: No overnight events.  Objective: Vitals:   07/26/17 0459 07/26/17 1300 07/26/17 2105 07/27/17 0533  BP: 131/66 136/64 129/64 (!) 146/66  Pulse: 92 (!) 108 99 95  Resp:  _0 Temp: 98.5 F (36.9 C) 97.8 F (36.6 C) (!) 97.3 F (36.3 C) 98.4 F (36.9 C)  TempSrc: Oral Oral Oral Oral  SpO2: 93% 100% 100% 96%  Weight:    110.4 kg (243 lb 6.2 oz)  Height:        Intake/Output Summary (Last 24 hours) at 07/27/2017 0849 Last data filed at 07/27/2017 0340 Gross per 24 hour  Intake 2383.75 ml  Output 2001 ml  Net 382.75 ml   Filed Weights   07/25/17 1511 07/26/17 0446 07/27/17 0533  Weight: 105 kg (231 lb 7.7 oz) 108.2 kg (238 lb 8.6 oz) 110.4 kg (243 lb 6.2 oz)  Physical Exam  Constitutional: Appears well-developed and well-nourished. No distress.   CVS: RRR, S1/S2 + Pulmonary: Effort and breath sounds normal, no stridor, rhonchi, wheezes, rales.  Abdominal: Soft. BS +,  Obese, non tender Musculoskeletal: Trace edema in LE, no tenderness Lymphadenopathy: No lymphadenopathy noted, cervical, inguinal. Neuro: Alert. Normal reflexes, muscle tone coordination. No cranial nerve deficit. Skin: Skin is  red  over right upper extremity  Psychiatric: Normal mood and affect. Behavior, judgment, thought content normal.     Data Reviewed: I have personally reviewed following labs and imaging studies  CBC: Recent Labs  Lab 07/21/17 0530 07/24/17 1528 07/24/17 1547 07/25/17 0247 07/27/17 0344  WBC 9.4 23.4*  --  16.2* 10.1  NEUTROABS 7.6 19.7*  --   --   --   HGB 8.0* 8.3* 8.2* 7.2* 8.0*  HCT 26.1* 27.8* 24.0* 24.2* 25.3*  MCV 95.3 93.9  --  94.2 92.0  PLT 255 295  --  169 419   Basic Metabolic Panel: Recent Labs  Lab 07/24/17 1528 07/24/17 1547 07/24/17 2134 07/25/17 0247 07/27/17 0344  NA 138 138  --  139 137  K 3.9 3.8  --  4.2 3.1*  CL 105 105  --  111 109  CO2 21*  --   --  20* 20*  GLUCOSE 118* 117*  --  103* 98  BUN 34* 32*  --  32* 15  CREATININE 2.59* 2.60*  --  1.90* 0.88  CALCIUM 8.2*  --   --  7.4* 7.9*  MG  --   --  1.8 1.8  --   PHOS  --   --  4.6 4.8*  --    GFR: Estimated Creatinine Clearance: 77.5 mL/min (by C-G formula based on SCr of 0.88 mg/dL). Liver Function Tests: Recent Labs  Lab 07/24/17 1528 07/25/17 0247  AST 21 20  ALT 13* 13*  ALKPHOS 97 82  BILITOT 0.2* 0.4  PROT 6.2* 5.5*  ALBUMIN 2.5* 2.1*   Recent Labs  Lab 07/24/17 1528  LIPASE 66*   No results for input(s): AMMONIA in the last 168 hours. Coagulation Profile: Recent Labs  Lab 07/24/17 1528  INR 2.05   Cardiac Enzymes: Recent Labs  Lab 07/25/17 0919  TROPONINI 0.17*   BNP (last 3 results) No results for input(s): PROBNP in the last 8760 hours. HbA1C: No results for input(s): HGBA1C in the last 72 hours. CBG: Recent Labs  Lab 07/26/17 1706 07/26/17 2115 07/27/17 0009 07/27/17 0531 07/27/17 0809  GLUCAP 67 72 113* 96 87   Lipid Profile: No results for input(s): CHOL, HDL, LDLCALC, TRIG, CHOLHDL, LDLDIRECT in the last 72 hours. Thyroid Function Tests: Recent Labs    07/25/17 0247  TSH 2.814   Anemia Panel: No results for input(s): VITAMINB12, FOLATE,  FERRITIN, TIBC, IRON, RETICCTPCT in the last 72 hours. Urine analysis:    Component Value Date/Time   COLORURINE AMBER (A) 07/24/2017 2143   APPEARANCEUR CLOUDY (A) 07/24/2017 2143   LABSPEC 1.015 07/24/2017 2143   PHURINE 6.0 07/24/2017 2143   GLUCOSEU NEGATIVE 07/24/2017 2143   HGBUR MODERATE (A) 07/24/2017 2143   BILIRUBINUR NEGATIVE 07/24/2017 2143   [place] NEGATIVE 07/24/2017 2143   PROTEINUR 30 (A) 07/24/2017 2143   NITRITE NEGATIVE 07/24/2017 2143   LEUKOCYTESUR LARGE (A) 07/24/2017 2143   Sepsis Labs: _0 (procalcitonin:4,lacticidven:4)   Recent Results (from the past 240 hour(s))  C difficile quick scan w PCR reflex     Status: None   Collection Time: 07/19/17  6:00 PM  Result Value Ref Range Status   C Diff antigen NEGATIVE NEGATIVE Final   C Diff toxin NEGATIVE NEGATIVE Final   C Diff interpretation No C. difficile detected.  Final    Comment: Performed at [facility], [place] [address] [place], [place] [address]  Culture, blood (routine x 2)     Status: None (Preliminary result)   Collection Time: 07/24/17  2:56 PM  Result Value Ref Range Status   Specimen Description BLOOD RIGHT NECK  Final   Special Requests   Final    BOTTLES DRAWN AEROBIC AND ANAEROBIC Blood Culture adequate volume   Culture   Final    NO GROWTH 2 DAYS Performed at [facility], [place] [address], [place], [place] [address]    Report Status PENDING  Incomplete  Culture, blood (routine x 2)     Status: None (Preliminary result)   Collection Time: 07/24/17  3:01 PM  Result Value Ref Range Status   Specimen Description BLOOD LEFT FOREARM  Final   Special Requests   Final    BOTTLES DRAWN AEROBIC AND ANAEROBIC Blood Culture adequate volume   Culture   Final    NO GROWTH 2 DAYS Performed at [facility], [place] [address], [place], [place] [address]    Report Status PENDING  Incomplete  Culture, Urine     Status: Abnormal   Collection Time: 07/25/17 10:40  AM  Result Value Ref Range Status   Specimen Description URINE, CATHETERIZED  Final   Special Requests   Final    NONE Performed at [facility], [place] [address], [place], [place] [address]    Culture MULTIPLE SPECIES PRESENT, SUGGEST RECOLLECTION (A)  Final   Report Status 07/26/2017 FINAL  Final     Radiology Studies: Ct Abdomen Pelvis Wo Contrast Result Date: 07/24/2017 1. Markedly distended bladder, extending above the umbilicus. This may be contributing to the patient's renal failure. Foley catheter placement would be helpful, as deemed clinically appropriate. 2. Small bilateral pleural effusions. Patchy bibasilar airspace opacities raise concern for pneumonia. 3. Hepatic masses measure up to 5.5 cm in size, reflecting known metastatic disease to the liver. 4. Vague 2.9 cm hypodensity at the inferior aspect of the spleen may also reflect metastatic disease. 5. Nonobstructing bilateral renal stones measure up to 4 mm in size. 6. Cholelithiasis.  Gallbladder otherwise unremarkable. 7. Scattered diverticulosis along the descending and sigmoid colon, without evidence of diverticulitis. 8. Small umbilical hernia, containing only fat. 9. Trace pericardial effusion noted.  Dg Chest Port 1 View Result Date: 07/24/2017 Resolving pneumonia or edema.  Small bilateral pleural effusions.   Scheduled Meds: . amiodarone  400 mg Oral Daily  . aspirin  81 mg Oral Daily  . atorvastatin  40 mg Oral q1800  . feeding supplement  237 mL Oral Q1500  . feeding supplement   30 mL Oral Daily  . heparin  5,000 Units Subcutaneous Q8H  . Lorlatinib  100 mg Oral Daily   Continuous Infusions: . dextrose 5 % and 0.9% NaCl 75 mL/hr at 07/26/17 0049  . piperacillin-tazobactam (ZOSYN)  IV 3.375 g (07/26/17 2145)     LOS: 3 days    Time spent: 25 minutes Greater than 50% of the time spent on counseling and coordinating the care.   Leisa Lenz, MD [facility] Pager [phone]  If 7PM-7AM,  please contact night-coverage [email or web address] Password [facility] 07/27/2017, 8:49 AM

## 2017-07-27 NOTE — Progress Notes (Signed)
Progress Note  Patient Name: Jennifer Delgado Date of Encounter: 07/27/2017  Primary Cardiologist: Sanda Klein, MD   Subjective   Pt is SOB   No CP   Inpatient Medications    Scheduled Meds: . amiodarone  400 mg Oral Daily  . aspirin  81 mg Oral Daily  . atorvastatin  40 mg Oral q1800  . feeding supplement (ENSURE ENLIVE)  237 mL Oral Q1500  . feeding supplement (PRO-STAT SUGAR FREE 64)  30 mL Oral Daily  . heparin  5,000 Units Subcutaneous Q8H  . Lorlatinib  100 mg Oral Daily   Continuous Infusions: . sodium chloride    . ampicillin-sulbactam (UNASYN) IV Stopped (07/27/17 1112)  . dextrose 5 % and 0.9% NaCl 75 mL/hr at 07/26/17 0049   PRN Meds: sodium chloride, albuterol, food thickener, HYDROcodone-acetaminophen, ondansetron (ZOFRAN) IV, RESOURCE THICKENUP CLEAR   Vital Signs    Vitals:   07/27/17 0533 07/27/17 0932 07/27/17 1149 07/27/17 1342  BP: (!) 146/66 (!) 150/65 (!) 154/75 118/61  Pulse: 95 92 92 85  Resp: 20     Temp: 98.4 F (36.9 C)  (!) 97.4 F (36.3 C)   TempSrc: Oral  Oral   SpO2: 96%  99%   Weight: 243 lb 6.2 oz (110.4 kg)     Height:        Intake/Output Summary (Last 24 hours) at 07/27/2017 1353 Last data filed at 07/27/2017 0340 Gross per 24 hour  Intake 2143.75 ml  Output 1100 ml  Net 1043.75 ml   Filed Weights   07/25/17 1511 07/26/17 0446 07/27/17 0533  Weight: 231 lb 7.7 oz (105 kg) 238 lb 8.6 oz (108.2 kg) 243 lb 6.2 oz (110.4 kg)    Telemetry    SR - Personally Reviewed  ECG    No new tracings - Personally Reviewed  Physical Exam   GEN: No acute distress.   Neck: JVP is increased   Cardiac: RRR, no murmurs, rubs, or gallops.  Respiratory: Rales at bases  GI: Soft, nontender, non-distended  MS:   R hand swollen, red   !+ LE edema   Neuro:  Nonfocal  Psych: Normal affect   Labs    Chemistry Recent Labs  Lab 07/24/17 1528 07/24/17 1547 07/25/17 0247 07/27/17 0344  NA 138 138 139 137  K 3.9 3.8 4.2 3.1*  CL  105 105 111 109  CO2 21*  --  20* 20*  GLUCOSE 118* 117* 103* 98  BUN 34* 32* 32* 15  CREATININE 2.59* 2.60* 1.90* 0.88  CALCIUM 8.2*  --  7.4* 7.9*  PROT 6.2*  --  5.5*  --   ALBUMIN 2.5*  --  2.1*  --   AST 21  --  20  --   ALT 13*  --  13*  --   ALKPHOS 97  --  82  --   BILITOT 0.2*  --  0.4  --   GFRNONAA 18*  --  27* >60  GFRAA 21*  --  31* >60  ANIONGAP 12  --  8 8     Hematology Recent Labs  Lab 07/24/17 1528 07/24/17 1547 07/25/17 0247 07/27/17 0344  WBC 23.4*  --  16.2* 10.1  RBC 2.96*  --  2.57* 2.75*  HGB 8.3* 8.2* 7.2* 8.0*  HCT 27.8* 24.0* 24.2* 25.3*  MCV 93.9  --  94.2 92.0  MCH 28.0  --  28.0 29.1  MCHC 29.9*  --  29.8* 31.6  RDW 17.3*  --  17.1* 16.6*  PLT 295  --  169 180    Cardiac Enzymes Recent Labs  Lab 07/25/17 0919  TROPONINI 0.17*    Recent Labs  Lab 07/24/17 1545  TROPIPOC 0.21*     BNP Recent Labs  Lab 07/24/17 2135 07/25/17 0247  BNP 684.8* 677.0*     DDimer No results for input(s): DDIMER in the last 168 hours.   Radiology    No results found.  Cardiac Studies   Echo 07/03/17: Study Conclusions - Left ventricle: The cavity size was normal. Wall thickness was   increased in a pattern of moderate LVH. Systolic function was   normal. The estimated ejection fraction was in the range of 60%   to 65%. Wall motion was normal; there were no regional wall   motion abnormalities. Doppler parameters are consistent with   abnormal left ventricular relaxation (grade 1 diastolic   dysfunction). - Right ventricle: The cavity size was mildly dilated. Systolic   function was mildly to moderately reduced. - Pericardium, extracardiac: A small pericardial effusion was   identified. There was no evidence of hemodynamic compromise.   Features were not consistent with tamponade physiology.  Patient Profile     67 y.o. female with a hx of recent VF arrest, NSTEMI/STEMI  who is being seen for the evaluation of N/V, hypotension and EKG  changes.  Assessment & Plan    1. Hypotension BP is OK    She is SOB  Has received about 5 L of fluid since admit   Exam with evid of volume increase I would stop IV fluids and give lasix x 1   She is taking PO   Follow     2. Hx of VF arrest on 07/03/17   RV dysfunction on echo   - troponin 0.21 --> 0.17 - EKG with TWI diffusely, may be related to decreased RV function - will not pursue ischemic evaluation given her metastatic cancer  3. Renal function - Cr less than 1   Follow       For questions or updates, please contact [place] Please consult [email or web address] for contact info under Cardiology/STEMI.      Signed, Dorris Carnes, MD  07/27/2017, 1:53 PM    Pt seen and examined  Working with speech therapy for swallowig Exam:  COmfortable   Neck  JVP nromal   Lungs are CTA  Cardiac RRR   No S3  Ext with tr edema  Cr still remains elevated compare to early last admittt Would continue IV hdration for now until renal function improves   Follow exam Continue medical Rx   Keep on amiodarone.  Dorris Carnes

## 2017-07-27 NOTE — Evaluation (Signed)
Occupational Therapy Evaluation Patient Details Name: Jennifer Delgado MRN: [id number] DOB: 1950/11/21 Today's Date: 07/27/2017    History of Present Illness 67 yo admitted 4/22 for acute encephalopathy after removing her oxygen, found to be hypotensive transiently requiring pressors, with acute renal failure. PMHx: lung CA with mets to brin and liver, STEMI, vocal cord dysfunction   Clinical Impression   Pt with decline in function and safety with ADLs and ADL mobility with decreased strength, balance endurance and cognition. Pt with edematous/painful R UE. Pt previously admitted and d/c to SNF for rehab and plans to return to SNF for rehab after this acute stay. Pt would benefit from acute OT services to address impairments to maximize level of function and safety    Follow Up Recommendations  SNF    Equipment Recommendations  None recommended by OT;Other (comment)(TBD at next venue of care)    Recommendations for Other Services       Precautions / Restrictions Precautions Precautions: Fall Precaution Comments: 2 falls in past 1 year Restrictions Weight Bearing Restrictions: No      Mobility Bed Mobility   Bed Mobility: Supine to Sit;Sit to Supine     Supine to sit: Mod assist;+2 for physical assistance;HOB elevated Sit to supine: Max assist;+2 for physical assistance   General bed mobility comments: mod +2 assist to pivot to right side of bed with rail, pad, increased time and cues to sequence. Assist to fully move legs off of bed and elevate trunk with HOb 30degrees. Return to bed max +2 assist for pivot back to bed and slide toward HOB total assist +2  Transfers Overall transfer level: Needs assistance   Transfers: Lateral/Scoot Transfers Sit to Stand: +2 physical assistance;Max assist         General transfer comment: initiated lateral scoot transfer with pt able to perform a few scoots however noted soiled pad under pt, unable to change in sitting and required  return to bed    Balance Overall balance assessment: Needs assistance Sitting-balance support: Bilateral upper extremity supported;Feet supported Sitting balance-Leahy Scale: Poor Sitting balance - Comments: posterior LOB with min-mod assist for sitting balance with cues for weight shift Postural control: Posterior lean                                 ADL either performed or assessed with clinical judgement   ADL Overall ADL's : Needs assistance/impaired Eating/Feeding: Bed level;Minimal assistance   Grooming: Moderate assistance;Sitting;Bed level   Upper Body Bathing: Maximal assistance;Sitting;Bed level   Lower Body Bathing: Total assistance   Upper Body Dressing : Maximal assistance;Sitting;Bed level   Lower Body Dressing: Total assistance   Toilet Transfer: Total assistance Toilet Transfer Details (indicate cue type and reason): initiated lateral scoot to recliner for transfer, however pad soiled and had to return to supin for hygiene     Tub/ Shower Transfer: Total assistance   Functional mobility during ADLs: Maximal assistance;+2 for physical assistance;+2 for safety/equipment;Cueing for sequencing       Vision Patient Visual Report: No change from baseline       Perception     Praxis      Pertinent Vitals/Pain Pain Assessment: Faces Pain Score: 5  Faces Pain Scale: Hurts a little bit Pain Location: RUE Pain Descriptors / Indicators: Aching;Sore Pain Intervention(s): Limited activity within patient's tolerance;Monitored during session;Repositioned     Hand Dominance Right   Extremity/Trunk Assessment Upper Extremity Assessment Upper  Extremity Assessment: Generalized weakness   Lower Extremity Assessment Lower Extremity Assessment: Generalized weakness RLE Deficits / Details: grossly 2/5 did not formally assess LLE Deficits / Details: grossly 2/5 did not formally assess   Cervical / Trunk Assessment Cervical / Trunk Assessment: Other  exceptions Cervical / Trunk Exceptions: rounded shoulders, forward head, body habitus limiting rOM   Communication Communication Communication: No difficulties   Cognition Arousal/Alertness: Awake/alert Behavior During Therapy: Flat affect Overall Cognitive Status: Impaired/Different from baseline Area of Impairment: Following commands;Problem solving                       Following Commands: Follows one step commands with increased time Safety/Judgement: Decreased awareness of safety;Decreased awareness of deficits   Problem Solving: Decreased initiation;Difficulty sequencing;Requires verbal cues;Requires tactile cues;Slow processing General Comments: pt with increased time to follow commands, posterior LOB in sitting with cues for midline and slow to respond to tasks   General Comments   pt very pleasant and cooperative    Exercises     Shoulder Instructions      Home Living Family/patient expects to be discharged to:: Skilled nursing facility Living Arrangements: Spouse/significant other Available Help at Discharge: Family;Available PRN/intermittently Type of Home: House Home Access: Stairs to enter Entrance Stairs-Number of Steps: 1   Home Layout: Able to live on main level with bedroom/bathroom;Two level     Bathroom Shower/Tub: Teacher, early years/pre: Standard     Home Equipment: Environmental consultant - 4 wheels          Prior Functioning/Environment Level of Independence: Independent with assistive device(s)        Comments: walked with RW, has been sponge bathing independently prior to last admission, has required 2 person assist since last hospitalization and unable to stand        OT Problem List: Decreased strength;Impaired balance (sitting and/or standing);Decreased range of motion;Decreased safety awareness;Decreased activity tolerance;Impaired UE functional use;Decreased cognition      OT Treatment/Interventions: Self-care/ADL training;DME  and/or AE instruction;Therapeutic activities;Therapeutic exercise;Patient/family education    OT Goals(Current goals can be found in the care plan section) Acute Rehab OT Goals Patient Stated Goal: to be able to walk and return home OT Goal Formulation: With patient Time For Goal Achievement: 08/10/17 Potential to Achieve Goals: Fair ADL Goals Pt Will Perform Eating: with min guard assist;with supervision;with set-up;sitting Pt Will Perform Grooming: with min assist;sitting Pt Will Perform Upper Body Bathing: with mod assist;sitting Pt Will Perform Lower Body Bathing: with max assist;with mod assist;sitting/lateral leans Pt Will Perform Upper Body Dressing: with mod assist;sitting Pt Will Transfer to Toilet: with max assist;with mod assist;stand pivot transfer;bedside commode Additional ADL Goal #1: pt will completed bed mobility with mod A to sit EOB in prep for grooming and UB ADLs tasks  OT Frequency: Min 2X/week   Barriers to D/C: Decreased caregiver support          Co-evaluation   Reason for Co-Treatment: Complexity of the patient's impairments (multi-system involvement);For patient/therapist safety PT goals addressed during session: Mobility/safety with mobility;Balance        AM-PAC PT "6 Clicks" Daily Activity     Outcome Measure Help from another person eating meals?: A Lot Help from another person taking care of personal grooming?: A Lot Help from another person toileting, which includes using toliet, bedpan, or urinal?: Total Help from another person bathing (including washing, rinsing, drying)?: Total Help from another person to put on and taking off regular upper body  clothing?: A Lot Help from another person to put on and taking off regular lower body clothing?: Total 6 Click Score: 9   End of Session    Activity Tolerance: Patient limited by fatigue Patient left: in bed;with call bell/phone within reach;with bed alarm set  OT Visit Diagnosis: Muscle  weakness (generalized) (M62.81);Repeated falls (R29.6);History of falling (Z91.81);Unsteadiness on feet (R26.81);Other abnormalities of gait and mobility (R26.89)                Time: 0383-3383 OT Time Calculation (min): 21 min Charges:  OT General Charges $OT Visit: 1 Visit OT Evaluation $OT Eval Moderate Complexity: 1 Mod G-Codes: OT G-codes **NOT FOR INPATIENT CLASS** Functional Assessment Tool Used: AM-PAC 6 Clicks Daily Activity     Britt Bottom 07/27/2017, 2:28 PM

## 2017-07-27 NOTE — Progress Notes (Signed)
RN changed dressing on pt's left lower leg per order.

## 2017-07-27 NOTE — Progress Notes (Signed)
  Speech Language Pathology Treatment: Dysphagia  Patient Details Name: Jennifer Delgado MRN: [id number] DOB: [birth year] Today's Date: 07/27/2017 Time: 9371-6967 SLP Time Calculation (min) (ACUTE ONLY): 14 min  Assessment / Plan / Recommendation Clinical Impression  Pt presents with no signs/symptoms concerning for aspiration this session with PO trials from breakfast tray. Pt reports continued coughing throughout the day, as indicated by initial cough before PO trials began. Pt was educated on restriction of thin liquid to water/ice chips ONLY, given observed airway compromise of thin liquids on previous FEES (4/23). Pt required Mod verbal/visual cues to maintain chin tuck strategy with straw sips. Given observed swallow function, recommend continuing current recommendation of Dysphagia 3 (Mech Soft) diet and Nectar Thick Liquids with chin tuck, aspiration precautions (slow, small bites/sips, eating only when alert and sitting upright, pausing intake if increase in cough or SOB), and intermittent supervision. SLP will continue to follow-up to determine safety with current diet.     HPI HPI: 35 yoF presenting from SNF with nausea, decreased PO intake, abd discomfort since recent discharge (4/19) with AMS after being off oxygen. Pt admitted with hypovolemic shock with HTN requiring pressors, acute on chronic respiratory failure. Pt was seen during recent admission with evidence of post-extubation dysphagia, weak cough and dysphonic voice. Pt was most recently recommended to have Dys 3 diet and nectar thick liquids with use of chin tuck; thin liquids via provale cup only (MBS 4/18).       SLP Plan  Continue with current plan of care       Recommendations  Diet recommendations: Dysphagia 3 (mechanical soft);Nectar-thick liquid Liquids provided via: Straw Medication Administration: Crushed with puree Supervision: Patient able to self feed;Intermittent supervision to cue for compensatory  strategies Compensations: Slow rate;Small sips/bites;Chin tuck Postural Changes and/or Swallow Maneuvers: Seated upright 90 degrees;Chin tuck                Oral Care Recommendations: Oral care BID;Oral care prior to ice chip/H20 Follow up Recommendations: Skilled Nursing facility SLP Visit Diagnosis: Dysphagia, oropharyngeal phase (R13.12) Plan: Continue with current plan of care       GO              Jennifer Delgado SLP Student Clinician  Jennifer Delgado 07/27/2017, 9:49 AM

## 2017-07-27 NOTE — Progress Notes (Signed)
Preliminary notes--Right upper extremity venous duplex study completed. Negative for deep veins thrombosis. Positive for Cephalic and Basilic veins on forearm segments and AC fossa area. Severe edema noted.   Attempted to call RN but no answer.   Hongying Landry Mellow (RDMS RVT) 07/27/17 4:08 PM

## 2017-07-28 DIAGNOSIS — D649 Anemia, unspecified: Secondary | ICD-10-CM

## 2017-07-28 DIAGNOSIS — I959 Hypotension, unspecified: Secondary | ICD-10-CM

## 2017-07-28 DIAGNOSIS — R748 Abnormal levels of other serum enzymes: Secondary | ICD-10-CM

## 2017-07-28 LAB — CBC
HEMATOCRIT: 24.7 % — AB (ref 36.0–46.0)
HEMOGLOBIN: 7.7 g/dL — AB (ref 12.0–15.0)
MCH: 27.8 pg (ref 26.0–34.0)
MCHC: 31.2 g/dL (ref 30.0–36.0)
MCV: 89.2 fL (ref 78.0–100.0)
Platelets: 175 10*3/uL (ref 150–400)
RBC: 2.77 MIL/uL — AB (ref 3.87–5.11)
RDW: 16.5 % — ABNORMAL HIGH (ref 11.5–15.5)
WBC: 8.2 10*3/uL (ref 4.0–10.5)

## 2017-07-28 LAB — GLUCOSE, CAPILLARY
GLUCOSE-CAPILLARY: 102 mg/dL — AB (ref 65–99)
GLUCOSE-CAPILLARY: 94 mg/dL (ref 65–99)
Glucose-Capillary: 81 mg/dL (ref 65–99)
Glucose-Capillary: 79 mg/dL (ref 65–99)
Glucose-Capillary: 95 mg/dL (ref 65–99)
Glucose-Capillary: 80 mg/dL (ref 65–99)

## 2017-07-28 LAB — BASIC METABOLIC PANEL
Anion gap: 10 (ref 5–15)
BUN: 12 mg/dL (ref 6–20)
CHLORIDE: 104 mmol/L (ref 101–111)
CO2: 23 mmol/L (ref 22–32)
Calcium: 8 mg/dL — ABNORMAL LOW (ref 8.9–10.3)
Creatinine, Ser: 0.76 mg/dL (ref 0.44–1.00)
GFR calc Af Amer: >60 mL/min (ref >60)
GFR calc non Af Amer: >60 mL/min (ref >60)
Glucose, Bld: 98 mg/dL (ref 65–99)
POTASSIUM: 3 mmol/L — AB (ref 3.5–5.1)
SODIUM: 137 mmol/L (ref 135–145)

## 2017-07-28 LAB — MAGNESIUM: MAGNESIUM: 1.5 mg/dL — AB (ref 1.7–2.4)

## 2017-07-28 MED ORDER — POTASSIUM CHLORIDE CRYS ER 20 MEQ PO TBCR
20.0000 meq | EXTENDED_RELEASE_TABLET | Freq: Two times a day (BID) | ORAL | Status: DC
  Administered 2017-07-28 – 2017-08-03 (×12): 20 meq via ORAL
  Filled 2017-07-28 (×12): qty 1

## 2017-07-28 MED ORDER — POTASSIUM CHLORIDE 10 MEQ/100ML IV SOLN
INTRAVENOUS | Status: AC
  Administered 2017-07-29: 10 meq via INTRAVENOUS
  Filled 2017-07-28: qty 100

## 2017-07-28 MED ORDER — MAGNESIUM SULFATE 4 GM/100ML IV SOLN
4.0000 g | Freq: Once | INTRAVENOUS | Status: AC
  Administered 2017-07-28: 4 g via INTRAVENOUS
  Filled 2017-07-28: qty 100

## 2017-07-28 MED ORDER — POTASSIUM CHLORIDE 10 MEQ/100ML IV SOLN
10.0000 meq | INTRAVENOUS | Status: AC
  Administered 2017-07-28 – 2017-07-29 (×4): 10 meq via INTRAVENOUS
  Filled 2017-07-28 (×2): qty 100

## 2017-07-28 MED ORDER — AMIODARONE HCL 200 MG PO TABS
200.0000 mg | ORAL_TABLET | Freq: Every day | ORAL | Status: DC
  Administered 2017-07-29 – 2017-08-03 (×6): 200 mg via ORAL
  Filled 2017-07-28 (×6): qty 1

## 2017-07-28 MED ORDER — POTASSIUM CHLORIDE 10 MEQ/100ML IV SOLN
INTRAVENOUS | Status: AC
  Administered 2017-07-28: 10 meq via INTRAVENOUS
  Filled 2017-07-28: qty 100

## 2017-07-28 MED ORDER — ALUM & MAG HYDROXIDE-SIMETH 200-200-20 MG/5ML PO SUSP
30.0000 mL | Freq: Once | ORAL | Status: AC
  Administered 2017-07-28: 30 mL via ORAL
  Filled 2017-07-28: qty 30

## 2017-07-28 NOTE — NC FL2 (Signed)
[place] MEDICAID FL2 LEVEL OF CARE SCREENING TOOL     IDENTIFICATION  Patient Name: Jennifer Delgado Birthdate: 05/21/1950 Sex: female Admission Date (Current Location): 07/24/2017  [facility] and [place] Number:  Herbalist and Address:  [facility], [place] [address], [place], [place] [address]      Provider Number: [id number]  Attending Physician Name and Address:  Robbie Lis, MD  Relative Name and Phone Number:       Current Level of Care: Hospital Recommended Level of Care: [place] Prior Approval Number:    Date Approved/Denied:   PASRR Number: [id number]  Discharge Plan: SNF    Current Diagnoses: Patient Active Problem List   Diagnosis Date Noted  . AKI (acute kidney injury) ([place])   . Shock ([place]) 07/24/2017  . Pressure injury of skin 07/24/2017  . Acute on chronic respiratory failure with hypoxia ([place]) 07/17/2017  . HCAP (healthcare-associated pneumonia) 07/17/2017  . Cardiac arrest ([place])   . Encounter for central line placement   . Acute encephalopathy   . Hyponatremia 06/30/2017  . Intractable nausea and vomiting 06/30/2017  . Non-small cell lung cancer with metastasis ([place]) 06/30/2017  . Lipodermatosclerosis of lower extremity due to varicose veins 06/30/2017  . Depression 06/30/2017  . GERD (gastroesophageal reflux disease) 06/30/2017    Orientation RESPIRATION BLADDER Height & Weight     Self, Time, Situation, Place  O2(Nasal Canula 5 L) Continent, Indwelling catheter Weight: 242 lb 4.6 oz (109.9 kg) Height:  5\' 4"  (162.6 cm)  BEHAVIORAL SYMPTOMS/MOOD NEUROLOGICAL BOWEL NUTRITION STATUS  (None) (None) Continent Diet(DYS 3. Fluid nectar thick. Meds crushed in puree. Chin tuck.)  AMBULATORY STATUS COMMUNICATION OF NEEDS Skin   Extensive Assist Verbally Bruising, Other (Comment)(Cellulitis, MASD. Deep tissue injury on right and left buttocks: Foam prn. Non-pressure wound on left lower leg: ABD,  Compression wrap, gauze, Silicone dressing, Silver hydrofiber daily.)                       Personal Care Assistance Level of Assistance  Bathing, Feeding, Dressing Bathing Assistance: Maximum assistance Feeding assistance: Limited assistance Dressing Assistance: Maximum assistance     Functional Limitations Info  Sight, Hearing, Speech Sight Info: Adequate Hearing Info: Adequate Speech Info: Adequate    SPECIAL CARE FACTORS FREQUENCY  PT (By licensed PT), OT (By licensed OT), Speech therapy     PT Frequency: 5 x week OT Frequency: 5 x week     Speech Therapy Frequency: 5 x week      Contractures Contractures Info: Not present    Additional Factors Info  Code Status, Allergies, Psychotropic, Isolation Precautions Code Status Info: Full Allergies Info: Daptomycin, Dilaudid (Hydromorphone). Psychotropic Info: History of depression: No meds.   Isolation Precautions Info: Contact: MRSA     Current Medications (07/28/2017):  This is the current hospital active medication list Current Facility-Administered Medications  Medication Dose Route Frequency Provider Last Rate Last Dose  . 0.9 %  sodium chloride infusion  250 mL Intravenous PRN Santos-Sanchez, Idalys, MD      . albuterol (PROVENTIL) (2.5 MG/3ML) 0.083% nebulizer solution 2.5 mg  2.5 mg Nebulization Q4H PRN Duke, Tami Lin, PA      . amiodarone (PACERONE) tablet 400 mg  400 mg Oral Daily Welford Roche, MD   400 mg at 07/27/17 0927  . Ampicillin-Sulbactam (UNASYN) 3 g in sodium chloride 0.9 % 100 mL IVPB  3 g Intravenous Q6H Robbie Lis, MD  200 mL/hr at 07/28/17 0617 3 g at 07/28/17 0617  . aspirin chewable tablet 81 mg  81 mg Oral Daily Welford Roche, MD   81 mg at 07/27/17 0928  . atorvastatin (LIPITOR) tablet 40 mg  40 mg Oral q1800 Welford Roche, MD   40 mg at 07/27/17 1559  . feeding supplement (ENSURE ENLIVE) (ENSURE ENLIVE) liquid 237 mL  237 mL Oral Q1500 Thomas, Tijo, DO    237 mL at 07/27/17 1414  . feeding supplement (PRO-STAT SUGAR FREE 64) liquid 30 mL  30 mL Oral Daily Thomas, Tijo, DO   30 mL at 07/27/17 0928  . food thickener (SIMPLYTHICK) packet 1 packet  1 packet Oral PRN Santos-Sanchez, Merlene Morse, MD      . heparin injection 5,000 Units  5,000 Units Subcutaneous Q8H Welford Roche, MD   5,000 Units at 07/28/17 0617  . HYDROcodone-acetaminophen (NORCO) 10-325 MG per tablet 1 tablet  1 tablet Oral Q6H PRN Gardiner Barefoot, NP   1 tablet at 07/27/17 2244  . Lorlatinib TABS 100 mg  100 mg Oral Daily Johna Sheriff, MD   100 mg at 07/27/17 0927  . ondansetron (ZOFRAN) injection 4 mg  4 mg Intravenous Q6H PRN Welford Roche, MD      . [place]   Oral PRN Carlyon Prows, DO         Discharge Medications: Please see discharge summary for a list of discharge medications.  Relevant Imaging Results:  Relevant Lab Results:   Additional Information SS#: [id number]. Discharged to [facility] on a 5-day LOG on 4/19 and was readmitted on 4/22.  Candie Chroman, LCSW

## 2017-07-28 NOTE — Progress Notes (Addendum)
Progress Note  Patient Name: Jennifer Delgado Date of Encounter: 07/28/2017  Primary Cardiologist: Sanda Klein, MD   Subjective   Dyspnea resolved with lasix yesterday. No chest pain.   Inpatient Medications    Scheduled Meds: . amiodarone  400 mg Oral Daily  . aspirin  81 mg Oral Daily  . atorvastatin  40 mg Oral q1800  . feeding supplement (ENSURE ENLIVE)  237 mL Oral Q1500  . feeding supplement (PRO-STAT SUGAR FREE 64)  30 mL Oral Daily  . heparin  5,000 Units Subcutaneous Q8H  . Lorlatinib  100 mg Oral Daily   Continuous Infusions: . sodium chloride    . ampicillin-sulbactam (UNASYN) IV 3 g (07/28/17 0617)   PRN Meds: sodium chloride, albuterol, food thickener, HYDROcodone-acetaminophen, ondansetron (ZOFRAN) IV, RESOURCE THICKENUP CLEAR   Vital Signs    Vitals:   07/27/17 1500 07/27/17 2034 07/28/17 0415 07/28/17 0634  BP:  125/67 116/83   Pulse:  96 88   Resp:  18 18   Temp:  (!) 97.5 F (36.4 C) 99.8 F (37.7 C) 97.6 F (36.4 C)  TempSrc:  Oral Oral Oral  SpO2: 99% 99% 100% 98%  Weight:   242 lb 4.6 oz (109.9 kg)   Height:        Intake/Output Summary (Last 24 hours) at 07/28/2017 0911 Last data filed at 07/27/2017 2314 Gross per 24 hour  Intake 820 ml  Output 5050 ml  Net -4230 ml   Filed Weights   07/26/17 0446 07/27/17 0533 07/28/17 0415  Weight: 238 lb 8.6 oz (108.2 kg) 243 lb 6.2 oz (110.4 kg) 242 lb 4.6 oz (109.9 kg)    Telemetry    SR at rate of 90s- Personally Reviewed  ECG    N/A  Physical Exam   GEN: No acute distress.   Neck: No JVD Cardiac: RRR, no murmurs, rubs, or gallops.  Respiratory: Clear to auscultation bilaterally. GI: Soft, nontender, non-distended  MS: 1+ BL LE edema, ACE wrap on LLE; No deformity. RUE erythema with edema Erythema is up the arm   Neuro:  Nonfocal  Psych: Normal affect   Labs    Chemistry Recent Labs  Lab 07/24/17 1528  07/25/17 0247 07/27/17 0344 07/28/17 0452  NA 138   < > 139 137 137    K 3.9   < > 4.2 3.1* 3.0*  CL 105   < > 111 109 104  CO2 21*  --  20* 20* 23  GLUCOSE 118*   < > 103* 98 98  BUN 34*   < > 32* 15 12  CREATININE 2.59*   < > 1.90* 0.88 0.76  CALCIUM 8.2*  --  7.4* 7.9* 8.0*  PROT 6.2*  --  5.5*  --   --   ALBUMIN 2.5*  --  2.1*  --   --   AST 21  --  20  --   --   ALT 13*  --  13*  --   --   ALKPHOS 97  --  82  --   --   BILITOT 0.2*  --  0.4  --   --   GFRNONAA 18*  --  27* >60 >60  GFRAA 21*  --  31* >60 >60  ANIONGAP 12  --  8 8 10    < > = values in this interval not displayed.     Hematology Recent Labs  Lab 07/25/17 0247 07/27/17 0344 07/28/17 0452  WBC 16.2* 10.1 8.2  RBC 2.57* 2.75* 2.77*  HGB 7.2* 8.0* 7.7*  HCT 24.2* 25.3* 24.7*  MCV 94.2 92.0 89.2  MCH 28.0 29.1 27.8  MCHC 29.8* 31.6 31.2  RDW 17.1* 16.6* 16.5*  PLT 169 180 175    Cardiac Enzymes Recent Labs  Lab 07/25/17 0919  TROPONINI 0.17*    Recent Labs  Lab 07/24/17 1545  TROPIPOC 0.21*     BNP Recent Labs  Lab 07/24/17 2135 07/25/17 0247  BNP 684.8* 677.0*     Radiology    No results found.  Cardiac Studies   None this admission   Patient Profile     67 y.o. female histeory of metastatic lung cancer (stage IV with mets to brain, sternum and spine), HTN, ulcerated lipodermatosclerosis of LLE,  recent VF arrest 4/1, NSTEMI/STEMI  (medical management) admitted 07/24/17 for evaluation of N/V, hypotension and EKG changes.   Assessment & Plan    1. Hypotension - Treated with IV fluids for a couple days after  admit. Yesterday noted sob due to volume overload. Given IV lasix x1 with signif diuresis  improvement. Now off fluids and diuretics. Volume status still mildly increased  . BP stable now. Consider restarting BB at low dose.  Will go to low Na diet   Repleted K    2. AKI  Cr normalized   - Resolved with hydration  3. Hx of VF arrest on 08/07/71  Signif metabolic abnormalities at that time  Trop positive at time     RV dysfunction on echo CT  olf lungs shows extensive bilateral infiltrates in early April 2019   - diffuse TWI on admission.Not new No chest pain. No work up planned given metastatic Ca . Medical management.  - Continue ASA, amiodarone and Statin. No recurrence arrhythmia  Would cut back on amiodarone to 200 mg per day    4  Anemia   Hgb 7.7  Relatively stable     5  Onc  Pt with Stage IV nonsmall cell lung CA  Abdomenal CT showed new lesion in liver  Followed by oncology    For questions or updates, please contact [facility] Please consult [email or web address] for contact info under Cardiology/STEMI.     Signed, Leanor Kail, PA  07/28/2017, 9:11 AM    Pt seen and examined  I have amended note above by B Bhagat to reflect my findings   On exam:   Lungs are CTA   Cardiac RRR  No S3  Abd is soft   Ext with 1+ edema  Pt is doing much better than yesterday  Diuresed signif   Still has some mild volume increase  But much improved  Wioud replete K   May give additional lasix tomorrow Low Na diet  (change, she is not on now) Would back down on amio to 200 mg per day   WOuld not pursue further work up of VF, RV dysfunciton.   Dorris Carnes

## 2017-07-28 NOTE — Clinical Social Work Note (Signed)
Clinical Social Work Assessment  Patient Details  Name: Jennifer Delgado MRN: [id number] Date of Birth: 10/06/1950  Date of referral:  07/28/17               Reason for consult:  Facility Placement, Discharge Planning                Permission sought to share information with:  Chartered certified accountant granted to share information::  Yes, Verbal Permission Granted  Name::        Agency::  SNF's  Relationship::     Contact Information:     Housing/Transportation Living arrangements for the past 2 months:  [place], [place] of Information:  Patient, Medical Team, Facility Patient Interpreter Needed:  None Criminal Activity/Legal Involvement Pertinent to Current Situation/Hospitalization:  No - Comment as needed Significant Relationships:  Spouse Lives with:  Spouse Do you feel safe going back to the place where you live?  Yes Need for family participation in patient care:  Yes (Comment)  Care giving concerns:  Patient was a short-term rehab resident at [facility]. PT continues to recommend SNF placement once medically stable for discharge.   Social Worker assessment / plan:  CSW met with patient. No supports at bedside. CSW introduced role and explained that PT recommendations would be discussed. Patient confirmed she was admitted from [facility] but does not want to return. First preference is [facility]. CSW sent referral and left message for hospital liaison. No further concerns. CSW encouraged patient to contact CSW as needed. CSW will continue to follow patient for support and facilitate discharge to SNF once medically stable.  Employment status:  Disabled (Comment on whether or not currently receiving Disability) Insurance information:  Medicare, Other (Comment Required)(BCBS) PT Recommendations:  [place] / Referral to community resources:  [place]  Patient/Family's Response to care:  Patient agreeable to SNF placement. Patient's husband supportive and involved in patient's care. Patient appreciated social work intervention.  Patient/Family's Understanding of and Emotional Response to Diagnosis, Current Treatment, and Prognosis:  Patient has a good understanding of the reason for admission and plan for continued SNF at discharge. Patient appears pleased with hospital care.  Emotional Assessment Appearance:  Appears stated age Attitude/Demeanor/Rapport:  Engaged, Gracious Affect (typically observed):  Accepting, Appropriate, Calm, Pleasant Orientation:  Oriented to Self, Oriented to Place, Oriented to  Time, Oriented to Situation Alcohol / Substance use:  Never Used Psych involvement (Current and /or in the community):  No (Comment)  Discharge Needs  Concerns to be addressed:  Care Coordination Readmission within the last 30 days:  Yes Current discharge risk:  Dependent with Mobility Barriers to Discharge:  Continued Medical Work up, [place], LCSW 07/28/2017, 8:54 AM

## 2017-07-28 NOTE — Progress Notes (Signed)
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 She was recently admitted 3/28 -4/19 for pneumonia and hyponatremia.  During this admission she developed V. fib arrest due to STEMI that was treated medically. She was admitted 4/22 for acute encephalopathy after removing her oxygen, found to be hypotensive transiently requiring pressors but responded to fluids.  She was also noted to have acute renal failure. She has chronic dysphagia and vocal cord dysfunction following prior intubation. CT abdomen showed distended bladder and nonobstructive calculi with liver metastases.  She has left lower extremity wound which does not appear to be infected.   Assessment & Plan:   Active Problems:   Hypotensive shock (HCC) - Likely due to intravascular volume depletion - Treated with IV fluids for few days then given lasix for fluid overload - Now off of IV fluids and lasix  - Off pressor support - BP 138/61  Chronic wounds to left anterior and posterior calf related to lipodermatosclerosis - Pt uses Drawtex dressings to absorb drainage, which are not available in the [place]. She uses Surepress for compression wrapswhich arealso not available. - Wound type:Multiple full thickness wounds Measurement:Affectedpatchyarea to anterior and posterior legs are 9X4X.2cm, 3X2X.2cm and 15X6X.2cm, seperated by narrow skin bridges. - Bedside nurse to change dressings Q day. Mepitel contact layers to protect skin graft sites from adherence of dressing and decrease discomfort with dressing changes. Substitute Aquacel for Drawtex to provide antimicrobial benefits and absorb drainage. ABD pads and kerlex, then ace wrap for light compression, since Surepress is not available.   Right upper extremity erythema and swelling probable  cellulitis  - Doppler study 4/25 was negative for deep veins thrombosis. Positive for Cephalic and Basilic veins on forearm segments and AC fossa area. Severe edema noted.   - Added unasyn 4/25 - Redness and swelling better   Aspiration pneumonitis - Stable resp status - On dysphagia 3 diet - Continue unasyn  H/O V fib arrest 07/03/17 - No further ischemic w/u given pt history of metastatic ca per cardio - Continue amiodarone   AKI - Due to hypotension - Cr is wnl  Hypokalemia/ Hypomagnesemia - Supplemented   Lung adenocarcinoma - Per oncology  Anemia of chronic disease - Due to malignancy - Hgb 7.7 - Transfuse for hgb less than 7  Moderate protein calorie malnutrition - In the context of chronic illness - Continue nutritional supplementation      DVT prophylaxis: Heparin subQ Code Status: full code  Family Communication: no family at the bedside Disposition Plan: to SNF likely Monday    Consultants:   Cardiology   WOC  SLP - eval 4/24 - dysphagia 3 diet  Procedures:   Upper extremity doppler 4/25  - Right upper extremity venous duplex study completed. Negative for deep veins thrombosis. Positive for Cephalic and Basilic veins on forearm segments and AC fossa area. Severe edema noted.   Attempted to call RN but no answer.   Antimicrobials:   Zosyn 4/22 --> 4/25  Unasyn 4/25 -->   Subjective: No overnight events.  Objective: Vitals:   07/28/17 0415 07/28/17 0634 07/28/17 1053 07/28/17 1145  BP: 116/83  (!) 120/53 138/61  Pulse: 88  76 86  Resp: 18  18 18   Temp: 99.8 F (37.7 C) 97.6 F (36.4 C) 97.9 F (36.6 C) 97.6 F (36.4 C)  TempSrc: Oral Oral Oral Oral  SpO2: 100% 98% 97% 96%  Weight: 109.9 kg (242 lb 4.6 oz)     Height:        Intake/Output Summary (Last 24 hours) at 07/28/2017 1727 Last data filed at 07/28/2017 1414 Gross per 24 hour  Intake 880 ml  Output 2500 ml  Net -1620 ml   Filed Weights   07/26/17 0446 07/27/17 0533  07/28/17 0415  Weight: 108.2 kg (238 lb 8.6 oz) 110.4 kg (243 lb 6.2 oz) 109.9 kg (242 lb 4.6 oz)    Physical Exam  Constitutional: Appears well-developed and well-nourished. No distress.  CVS: RRR, S1/S2 (+) Pulmonary: Effort and breath sounds normal, no stridor, rhonchi, wheezes, rales.  Abdominal: Soft. BS +,  no distension, tenderness, rebound or guarding.  Musculoskeletal: abd is obese, (+) BS, non tender  Lymphadenopathy: No lymphadenopathy noted, cervical, inguinal. Neuro: Alert. Normal reflexes, muscle tone coordination. No cranial nerve deficit. Skin: Skin is warm and dry. Edema in LE +1; RUE redness and swelling  better  Psychiatric: Normal mood and affect. Behavior, judgment, thought content normal.    Data Reviewed: I have personally reviewed following labs and imaging studies  CBC: Recent Labs  Lab 07/24/17 1528 07/24/17 1547 07/25/17 0247 07/27/17 0344 07/28/17 0452  WBC 23.4*  --  16.2* 10.1 8.2  NEUTROABS 19.7*  --   --   --   --   HGB 8.3* 8.2* 7.2* 8.0* 7.7*  HCT 27.8* 24.0* 24.2* 25.3* 24.7*  MCV 93.9  --  94.2 92.0 89.2  PLT 295  --  169 180 366   Basic Metabolic Panel: Recent Labs  Lab 07/24/17 1528 07/24/17 1547 07/24/17 2134 07/25/17 0247 07/27/17 0344 07/28/17 0452 07/28/17 1305  NA 138 138  --  139 137 137  --   K 3.9 3.8  --  4.2 3.1* 3.0*  --   CL 105 105  --  111 109 104  --   CO2 21*  --   --  20* 20* 23  --   GLUCOSE 118* 117*  --  103* 98 98  --   BUN 34* 32*  --  32* 15 12  --   CREATININE 2.59* 2.60*  --  1.90* 0.88 0.76  --   CALCIUM 8.2*  --   --  7.4* 7.9* 8.0*  --   MG  --   --  1.8 1.8  --   --  1.5*  PHOS  --   --  4.6 4.8*  --   --   --    GFR: Estimated Creatinine Clearance: 85 mL/min (by C-G formula based on SCr of 0.76 mg/dL). Liver Function Tests: Recent Labs  Lab 07/24/17 1528 07/25/17 0247  AST 21 20  ALT 13* 13*  ALKPHOS 97 82  BILITOT 0.2* 0.4  PROT 6.2* 5.5*  ALBUMIN 2.5* 2.1*   Recent Labs  Lab  07/24/17 1528  LIPASE 66*   No results for input(s): AMMONIA in the last 168 hours. Coagulation Profile: Recent Labs  Lab 07/24/17 1528  INR 2.05   Cardiac Enzymes: Recent Labs  Lab 07/25/17 0919  TROPONINI 0.17*   BNP (last 3 results) No results for input(s): PROBNP in the last 8760 hours. HbA1C: No results for input(s): HGBA1C in the last 72 hours. CBG: Recent Labs  Lab 07/28/17 0014 07/28/17 0414 07/28/17 0746 07/28/17 1141 07/28/17 1706  GLUCAP 102* 94 81 79 95   Lipid Profile: No results for input(s): CHOL,  HDL, LDLCALC, TRIG, CHOLHDL, LDLDIRECT in the last 72 hours. Thyroid Function Tests: No results for input(s): TSH, T4TOTAL, FREET4, T3FREE, THYROIDAB in the last 72 hours. Anemia Panel: No results for input(s): VITAMINB12, FOLATE, FERRITIN, TIBC, IRON, RETICCTPCT in the last 72 hours. Urine analysis:    Component Value Date/Time   COLORURINE AMBER (A) 07/24/2017 2143   APPEARANCEUR CLOUDY (A) 07/24/2017 2143   LABSPEC 1.015 07/24/2017 2143   PHURINE 6.0 07/24/2017 2143   GLUCOSEU NEGATIVE 07/24/2017 2143   HGBUR MODERATE (A) 07/24/2017 2143   BILIRUBINUR NEGATIVE 07/24/2017 2143   [place] NEGATIVE 07/24/2017 2143   PROTEINUR 30 (A) 07/24/2017 2143   NITRITE NEGATIVE 07/24/2017 2143   LEUKOCYTESUR LARGE (A) 07/24/2017 2143   Sepsis Labs: @LABRCNTIP (procalcitonin:4,lacticidven:4)   Recent Results (from the past 240 hour(s))  C difficile quick scan w PCR reflex     Status: None   Collection Time: 07/19/17  6:00 PM  Result Value Ref Range Status   C Diff antigen NEGATIVE NEGATIVE Final   C Diff toxin NEGATIVE NEGATIVE Final   C Diff interpretation No C. difficile detected.  Final    Comment: Performed at [facility], [place] [address], [place], [place] [address]  Culture, blood (routine x 2)     Status: None (Preliminary result)   Collection Time: 07/24/17  2:56 PM  Result Value Ref Range Status   Specimen Description BLOOD  RIGHT NECK  Final   Special Requests   Final    BOTTLES DRAWN AEROBIC AND ANAEROBIC Blood Culture adequate volume   Culture   Final    NO GROWTH 4 DAYS Performed at [facility], [address], [place], [place] [address]    Report Status PENDING  Incomplete  Culture, blood (routine x 2)     Status: None (Preliminary result)   Collection Time: 07/24/17  3:01 PM  Result Value Ref Range Status   Specimen Description BLOOD LEFT FOREARM  Final   Special Requests   Final    BOTTLES DRAWN AEROBIC AND ANAEROBIC Blood Culture adequate volume   Culture   Final    NO GROWTH 4 DAYS Performed at [facility], [place] [address] [place], [place] [address]    Report Status PENDING  Incomplete  Culture, Urine     Status: Abnormal   Collection Time: 07/25/17 10:40 AM  Result Value Ref Range Status   Specimen Description URINE, CATHETERIZED  Final   Special Requests   Final    NONE Performed at [facility], [place] [address], [place],  [address]    Culture MULTIPLE SPECIES PRESENT, SUGGEST RECOLLECTION (A)  Final   Report Status 07/26/2017 FINAL  Final     Radiology Studies: Ct Abdomen Pelvis Wo Contrast Result Date: 07/24/2017 1. Markedly distended bladder, extending above the umbilicus. This may be contributing to the patient's renal failure. Foley catheter placement would be helpful, as deemed clinically appropriate. 2. Small bilateral pleural effusions. Patchy bibasilar airspace opacities raise concern for pneumonia. 3. Hepatic masses measure up to 5.5 cm in size, reflecting known metastatic disease to the liver. 4. Vague 2.9 cm hypodensity at the inferior aspect of the spleen may also reflect metastatic disease. 5. Nonobstructing bilateral renal stones measure up to 4 mm in size. 6. Cholelithiasis.  Gallbladder otherwise unremarkable. 7. Scattered diverticulosis along the descending and sigmoid colon, without evidence of diverticulitis. 8. Small umbilical hernia, containing  only fat. 9. Trace pericardial effusion noted.  Dg Chest [facility]  1 View Result Date: 07/24/2017 Resolving pneumonia or edema.  Small bilateral pleural effusions.   Scheduled Meds: . amiodarone  400 mg Oral Daily  . aspirin  81 mg Oral Daily  . atorvastatin  40 mg Oral q1800  . feeding supplement  237 mL Oral Q1500  . feeding supplement   30 mL Oral Daily  . heparin  5,000 Units Subcutaneous Q8H  . Lorlatinib  100 mg Oral Daily   Continuous Infusions: . dextrose 5 % and 0.9% NaCl 75 mL/hr at 07/26/17 0049  . piperacillin-tazobactam (ZOSYN)  IV 3.375 g (07/26/17 2145)     LOS: 4 days    Time spent: 25 minutes Greater than 50% of the time spent on counseling and coordinating the care.   Leisa Lenz, MD [facility] Pager [phone]  If 7PM-7AM, please contact night-coverage [email or web address] Password [facility] 07/28/2017, 5:27 PM

## 2017-07-28 NOTE — Clinical Social Work Note (Addendum)
[facility] has made a bed offer. CSW paged MD to see if there was an anticipated discharge date yet as insurance authorization typically takes two days to obtain.  Dayton Scrape, [place] [phone]  11:25 am Per MD, anticipated discharge date is Monday. SNF will start insurance authorization today.  Dayton Scrape, [place]

## 2017-07-29 DIAGNOSIS — C3490 Malignant neoplasm of unspecified part of unspecified bronchus or lung: Secondary | ICD-10-CM

## 2017-07-29 DIAGNOSIS — R627 Adult failure to thrive: Secondary | ICD-10-CM

## 2017-07-29 DIAGNOSIS — E876 Hypokalemia: Secondary | ICD-10-CM

## 2017-07-29 LAB — GLUCOSE, CAPILLARY
GLUCOSE-CAPILLARY: 90 mg/dL (ref 65–99)
GLUCOSE-CAPILLARY: 81 mg/dL (ref 65–99)
GLUCOSE-CAPILLARY: 79 mg/dL (ref 65–99)
Glucose-Capillary: 67 mg/dL (ref 65–99)
Glucose-Capillary: 70 mg/dL (ref 65–99)
Glucose-Capillary: 78 mg/dL (ref 65–99)
Glucose-Capillary: 87 mg/dL (ref 65–99)

## 2017-07-29 LAB — BASIC METABOLIC PANEL
ANION GAP: 8 (ref 5–15)
BUN: 6 mg/dL (ref 6–20)
CALCIUM: 7.8 mg/dL — AB (ref 8.9–10.3)
CO2: 25 mmol/L (ref 22–32)
Chloride: 100 mmol/L — ABNORMAL LOW (ref 101–111)
Creatinine, Ser: 0.62 mg/dL (ref 0.44–1.00)
Glucose, Bld: 104 mg/dL — ABNORMAL HIGH (ref 65–99)
Potassium: 3.3 mmol/L — ABNORMAL LOW (ref 3.5–5.1)
SODIUM: 133 mmol/L — AB (ref 135–145)

## 2017-07-29 LAB — CBC
HCT: 24.5 % — ABNORMAL LOW (ref 36.0–46.0)
Hemoglobin: 7.6 g/dL — ABNORMAL LOW (ref 12.0–15.0)
MCH: 27.5 pg (ref 26.0–34.0)
MCHC: 31 g/dL (ref 30.0–36.0)
MCV: 88.8 fL (ref 78.0–100.0)
PLATELETS: 163 10*3/uL (ref 150–400)
RBC: 2.76 MIL/uL — ABNORMAL LOW (ref 3.87–5.11)
RDW: 16.5 % — AB (ref 11.5–15.5)
WBC: 8.3 10*3/uL (ref 4.0–10.5)

## 2017-07-29 LAB — CULTURE, BLOOD (ROUTINE X 2)
CULTURE: NO GROWTH
Culture: NO GROWTH
SPECIAL REQUESTS: ADEQUATE
Special Requests: ADEQUATE

## 2017-07-29 MED ORDER — GLUCOSE 40 % PO GEL
ORAL | Status: AC
  Administered 2017-07-29: 37.5 g
  Filled 2017-07-29: qty 1

## 2017-07-29 MED ORDER — LOPERAMIDE HCL 2 MG PO CAPS
2.0000 mg | ORAL_CAPSULE | Freq: Four times a day (QID) | ORAL | Status: DC | PRN
  Administered 2017-07-29 – 2017-08-02 (×6): 2 mg via ORAL
  Filled 2017-07-29 (×5): qty 1

## 2017-07-29 NOTE — Progress Notes (Signed)
BS=67. Oral glocugel  37.5 gms given. P;t can't tolerqate po glucerna or pudding

## 2017-07-29 NOTE — Progress Notes (Addendum)
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 She was recently admitted 3/28 -4/19 for pneumonia and hyponatremia. During this admission she developed V. fib arrest due to STEMI that was treated medically.   She was admitted 4/22 for acute encephalopathy after removing her oxygen, found to be hypotensive transiently requiring pressors but responded to fluids. She was also noted to have acute renal failure. She has chronic dysphagia and vocal cord dysfunction following prior intubation. CT abdomen showed distended bladder and nonobstructive calculi with liver metastases. She has left lower extremity wound which does not appear to be infected    HPI/Recap of past 24 hours:  Report intermittent lose stool , no ab pain, no n/v, no fever Foley catheter in place , she reports this was put on presentation due to urinary retention  Husband at bedside  Assessment/Plan: Active Problems:   Shock ([place])   Pressure injury of skin   AKI (acute kidney injury) ([place])   Active Problems:   Hypotensive shock ([place]) - Likely due to intravascular volume depletion - Treated with IV fluids for few days then given lasix for fluid overload - Now off of IV fluids and lasix  - Off pressor support - BP 138/61  Chronic wounds to left anterior and posterior calf related to lipodermatosclerosis - Pt uses Drawtex dressings to absorb drainage, which are not available in the [place]. She uses Surepress for compression wrapswhich arealso not available. - Wound type:Multiple full thickness wounds Measurement:Affectedpatchyarea to anteriorand posterior legs are 9X4X.2cm, 3X2X.2cm and 15X6X.2cm,seperated by narrow skin bridges. - Bedside nurse to change dressings Q day. Mepitel contact layersto protect skin graft sitesfrom  adherence of dressing and decrease discomfort with dressing changes. Substitute Aquacel for Drawtex to provide antimicrobial benefits and absorb drainage. ABD pads and kerlex, then ace wrap for light compression, since Surepress is not available.   Right upper extremity erythema and swelling probable cellulitis  - Doppler study 4/25 was negative for deep veins thrombosis. Positive for Cephalic and Basilic veins on forearm segments and AC fossa area. Severe edema noted.  - Added unasyn 4/25 - Redness and swelling better   Aspiration pneumonitis - Stable resp status - On dysphagia 3 diet - Continue unasyn  H/O V fib arrest 07/03/17 - No further ischemic w/u given pt history of metastatic ca per cardio - Continue amiodarone  -cardiology following  AKI - Due to hypotension - Cr is wnl  Hypokalemia/ Hypomagnesemia - Supplemented Repeat in am   Lung adenocarcinoma with brain and liver mets On Alk inhibitor (see note from oncology from 4/12) - she needs to follow up with oncology  Anemia of chronic disease - Due to malignancy - Hgb 7.7 - Transfuse for hgb less than 7  Moderate protein calorie malnutrition - In the context of chronic illness - Continue nutritional supplementation   morbid obesity: Body mass index is 41.4 kg/m.   DVT prophylaxis: Heparin subQ Code Status: full code  Family Communication: husband Disposition Plan: to SNF likely Monday    Consultants:  Critical care  Cardiology   WOC  SLP - eval 4/24 - dysphagia 3 diet  Procedures:   Upper extremity doppler 4/25  - Right upper extremity venous duplex study completed. Negative for deep veins thrombosis. Positive for Cephalic and Basilic veins on forearm segments and AC fossa area. Severe edema noted.  Antimicrobials:   Zosyn 4/22 --> 4/25  Unasyn 4/25 -->    Objective: BP (!) 125/53 (BP Location: Right Arm)   Pulse 84   Temp 98.8 F (37.1 C) (Oral)   Resp 18   Ht 5' 4"   (1.626 m)   Wt 109.4 kg (241 lb 2.9 oz)   SpO2 95%   BMI 41.40 kg/m   Intake/Output Summary (Last 24 hours) at 07/29/2017 1655 Last data filed at 07/29/2017 1032 Gross per 24 hour  Intake 1060 ml  Output 875 ml  Net 185 ml   Filed Weights   07/27/17 0533 07/28/17 0415 07/29/17 0416  Weight: 110.4 kg (243 lb 6.2 oz) 109.9 kg (242 lb 4.6 oz) 109.4 kg (241 lb 2.9 oz)    Exam: Patient is examined daily including today on 07/29/2017, exams remain the same as of yesterday except that has changed    General:  Chronically ill, but NAD  Cardiovascular: RRR  Respiratory: CTABL  Abdomen: Soft/ND/NT, positive BS  Musculoskeletal: right upper extremity edema, mild bilateral pedal Edema  Neuro: alert, oriented   Data Reviewed: Basic Metabolic Panel: Recent Labs  Lab 07/24/17 1528 07/24/17 1547 07/24/17 2134 07/25/17 0247 07/27/17 0344 07/28/17 0452 07/28/17 1305 07/29/17 0625  NA 138 138  --  139 137 137  --  133*  K 3.9 3.8  --  4.2 3.1* 3.0*  --  3.3*  CL 105 105  --  111 109 104  --  100*  CO2 21*  --   --  20* 20* 23  --  25  GLUCOSE 118* 117*  --  103* 98 98  --  104*  BUN 34* 32*  --  32* 15 12  --  6  CREATININE 2.59* 2.60*  --  1.90* 0.88 0.76  --  0.62  CALCIUM 8.2*  --   --  7.4* 7.9* 8.0*  --  7.8*  MG  --   --  1.8 1.8  --   --  1.5*  --   PHOS  --   --  4.6 4.8*  --   --   --   --    Liver Function Tests: Recent Labs  Lab 07/24/17 1528 07/25/17 0247  AST 21 20  ALT 13* 13*  ALKPHOS 97 82  BILITOT 0.2* 0.4  PROT 6.2* 5.5*  ALBUMIN 2.5* 2.1*   Recent Labs  Lab 07/24/17 1528  LIPASE 66*   No results for input(s): AMMONIA in the last 168 hours. CBC: Recent Labs  Lab 07/24/17 1528 07/24/17 1547 07/25/17 0247 07/27/17 0344 07/28/17 0452 07/29/17 0625  WBC 23.4*  --  16.2* 10.1 8.2 8.3  NEUTROABS 19.7*  --   --   --   --   --   HGB 8.3* 8.2* 7.2* 8.0* 7.7* 7.6*  HCT 27.8* 24.0* 24.2* 25.3* 24.7* 24.5*  MCV 93.9  --  94.2 92.0 89.2 88.8    PLT 295  --  169 180 175 163   Cardiac Enzymes:   Recent Labs  Lab 07/25/17 0919  TROPONINI 0.17*   BNP (last 3 results) Recent Labs    07/24/17 2135 07/25/17 0247  BNP 684.8* 677.0*    ProBNP (last 3 results) No results for input(s): PROBNP in the last 8760 hours.  CBG: Recent Labs  Lab 07/29/17 0013 07/29/17 0413 07/29/17 0749 07/29/17 1155 07/29/17 1622  GLUCAP 70 87 90 81 78    Recent Results (from the past 240 hour(s))  C difficile  quick scan w PCR reflex     Status: None   Collection Time: 07/19/17  6:00 PM  Result Value Ref Range Status   C Diff antigen NEGATIVE NEGATIVE Final   C Diff toxin NEGATIVE NEGATIVE Final   C Diff interpretation No C. difficile detected.  Final    Comment: Performed at [facility], [place] [address], [place], [place] [address]  Culture, blood (routine x 2)     Status: None   Collection Time: 07/24/17  2:56 PM  Result Value Ref Range Status   Specimen Description BLOOD RIGHT NECK  Final   Special Requests   Final    BOTTLES DRAWN AEROBIC AND ANAEROBIC Blood Culture adequate volume   Culture   Final    NO GROWTH 5 DAYS Performed at [facility], [address], [place], [place] [address]    Report Status 07/29/2017 FINAL  Final  Culture, blood (routine x 2)     Status: None   Collection Time: 07/24/17  3:01 PM  Result Value Ref Range Status   Specimen Description BLOOD LEFT FOREARM  Final   Special Requests   Final    BOTTLES DRAWN AEROBIC AND ANAEROBIC Blood Culture adequate volume   Culture   Final    NO GROWTH 5 DAYS Performed at [facility], [place] [address], [place],  [address]    Report Status 07/29/2017 FINAL  Final  Culture, Urine     Status: Abnormal   Collection Time: 07/25/17 10:40 AM  Result Value Ref Range Status   Specimen Description URINE, CATHETERIZED  Final   Special Requests   Final    NONE Performed at [facility], [place] [address], [place],   [address]    Culture MULTIPLE SPECIES PRESENT, SUGGEST RECOLLECTION (A)  Final   Report Status 07/26/2017 FINAL  Final     Studies: No results found.  Scheduled Meds: . amiodarone  200 mg Oral Daily  . aspirin  81 mg Oral Daily  . atorvastatin  40 mg Oral q1800  . feeding supplement (ENSURE ENLIVE)  237 mL Oral Q1500  . feeding supplement (PRO-STAT SUGAR FREE 64)  30 mL Oral Daily  . heparin  5,000 Units Subcutaneous Q8H  . Lorlatinib  100 mg Oral Daily  . potassium chloride  20 mEq Oral BID    Continuous Infusions: . sodium chloride    . ampicillin-sulbactam (UNASYN) IV 3 g (07/29/17 1655)     Time spent: 53mns I have personally reviewed and interpreted on  07/29/2017 daily labs, tele strips, imagings as discussed above under date review session and assessment and plans.  I reviewed all nursing notes, pharmacy notes, consultant notes,  vitals, pertinent old records  I have discussed plan of care as described above with RN , patient and family on 07/29/2017   FFlorencia ReasonsMD, PhD  [facility] Pager [phone] If 7PM-7AM, please contact night-coverage at [email or web address], password [facility]4/27/2019, 4:55 PM  LOS: 5 days

## 2017-07-30 DIAGNOSIS — R2231 Localized swelling, mass and lump, right upper limb: Secondary | ICD-10-CM

## 2017-07-30 LAB — MAGNESIUM: MAGNESIUM: 1.9 mg/dL (ref 1.7–2.4)

## 2017-07-30 LAB — GLUCOSE, CAPILLARY
Glucose-Capillary: 77 mg/dL (ref 65–99)
Glucose-Capillary: 81 mg/dL (ref 65–99)
Glucose-Capillary: 84 mg/dL (ref 65–99)
Glucose-Capillary: 84 mg/dL (ref 65–99)
Glucose-Capillary: 88 mg/dL (ref 65–99)
Glucose-Capillary: 92 mg/dL (ref 65–99)

## 2017-07-30 LAB — CBC
HCT: 24.7 % — ABNORMAL LOW (ref 36.0–46.0)
Hemoglobin: 7.5 g/dL — ABNORMAL LOW (ref 12.0–15.0)
MCH: 27 pg (ref 26.0–34.0)
MCHC: 30.4 g/dL (ref 30.0–36.0)
MCV: 88.8 fL (ref 78.0–100.0)
PLATELETS: 187 10*3/uL (ref 150–400)
RBC: 2.78 MIL/uL — ABNORMAL LOW (ref 3.87–5.11)
RDW: 16.7 % — ABNORMAL HIGH (ref 11.5–15.5)
WBC: 8.5 10*3/uL (ref 4.0–10.5)

## 2017-07-30 LAB — BASIC METABOLIC PANEL
ANION GAP: 6 (ref 5–15)
BUN: 8 mg/dL (ref 6–20)
CO2: 25 mmol/L (ref 22–32)
Calcium: 7.9 mg/dL — ABNORMAL LOW (ref 8.9–10.3)
Chloride: 102 mmol/L (ref 101–111)
Creatinine, Ser: 0.6 mg/dL (ref 0.44–1.00)
GFR calc Af Amer: >60 mL/min (ref >60)
Glucose, Bld: 93 mg/dL (ref 65–99)
Potassium: 4.3 mmol/L (ref 3.5–5.1)
SODIUM: 133 mmol/L — AB (ref 135–145)

## 2017-07-30 NOTE — Progress Notes (Signed)
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 She was recently admitted 3/28 -4/19 for pneumonia and hyponatremia.  During this admission she developed V. fib arrest due to STEMI that was treated medically. She was admitted 4/22 for acute encephalopathy after removing her oxygen, found to be hypotensive transiently requiring pressors but responded to fluids.  She was also noted to have acute renal failure. She has chronic dysphagia and vocal cord dysfunction following prior intubation. CT abdomen showed distended bladder and nonobstructive calculi with liver metastases.  She has left lower extremity wound which does not appear to be infected.   Assessment & Plan:   Active Problems:   Hypotensive shock (HCC) - Likely due to intravascular volume depletion - Treated with IV fluids for few days then given lasix for fluid overload - Now off of IV fluids and lasix  - Off pressor support - BP remains stable   Chronic wounds to left anterior and posterior calf related to lipodermatosclerosis - Pt uses Drawtex dressings to absorb drainage, which are not available in the [place]. She uses Surepress for compression wrapswhich arealso not available. - Wound type:Multiple full thickness wounds Measurement:Affectedpatchyarea to anterior and posterior legs are 9X4X.2cm, 3X2X.2cm and 15X6X.2cm, seperated by narrow skin bridges. - Bedside nurse to change dressings Q day. Mepitel contact layers to protect skin graft sites from adherence of dressing and decrease discomfort with dressing changes. Substitute Aquacel for Drawtex to provide antimicrobial benefits and absorb drainage. ABD pads and kerlex, then ace wrap for light compression, since Surepress is not available.   Right upper extremity erythema and swelling  probable cellulitis  - Doppler study 4/25 was negative for deep veins thrombosis. Positive for Cephalic and Basilic veins on forearm segments and AC fossa area. Severe edema noted.   - Added unasyn 4/25 - Redness and swelling much better over past 48 hours   Aspiration pneumonitis - Stable resp status - On dysphagia 3 diet - Continue unasyn  H/O V fib arrest 07/03/17 - No further ischemic w/u given pt history of metastatic ca per cardio - Continue amiodarone  - Appreciate cardio following   AKI - Due to hypotension - Cr is wnl  Hypokalemia/ Hypomagnesemia - Supplemented   Lung adenocarcinoma with brain and liver mets  - Per oncology - Outpt follow up  - Plan to start ALK inh on outpt basis  Anemia of chronic disease - Due to malignancy - Hgb is 7.5, stable   Moderate protein calorie malnutrition - In the context of chronic illness - Continue nutritional supplementation     DVT prophylaxis: Heparin subQ Code Status: full code  Family Communication: no family at bedside this am Disposition Plan: anticipate d/c Monday provided further improvement in right arm redness and swelling and if pt hemoglobin remains stable    Consultants:   Cardiology   WOC  SLP - eval 4/24 - dysphagia 3 diet  Procedures:   Upper extremity doppler 4/25  - Right upper extremity venous duplex study completed. Negative for deep veins thrombosis. Positive for Cephalic and Basilic veins on forearm segments and AC fossa area. Severe edema noted.   Attempted to call RN but no answer.   Antimicrobials:   Zosyn 4/22 --> 4/25  Unasyn 4/25 -->   Subjective: No overnight events.  Objective: Vitals:   07/30/17 0445 07/30/17  0449 07/30/17 1011 07/30/17 1231  BP: 122/63  (!) 117/53 127/70  Pulse: 88  90 85  Resp:   18 18  Temp:   98.1 F (36.7 C) 97.8 F (36.6 C)  TempSrc:   Oral Oral  SpO2: 98%   97%  Weight:  110.6 kg (243 lb 13.3 oz)    Height:        Intake/Output Summary  (Last 24 hours) at 07/30/2017 1447 Last data filed at 07/30/2017 1112 Gross per 24 hour  Intake 650 ml  Output 975 ml  Net -325 ml   Filed Weights   07/28/17 0415 07/29/17 0416 07/30/17 0449  Weight: 109.9 kg (242 lb 4.6 oz) 109.4 kg (241 lb 2.9 oz) 110.6 kg (243 lb 13.3 oz)    Physical Exam  Constitutional: Appears well-developed and well-nourished. No distress.  HENT: Normocephalic. External right and left ear normal. Oropharynx is clear and moist.  Eyes: Conjunctivae and EOM are normal. PERRLA, no scleral icterus.  Neck: Normal ROM. Neck supple. No JVD. No tracheal deviation. No thyromegaly.  CVS: RRR, S1/S2 + Pulmonary: Effort and breath sounds normal, no stridor, rhonchi, wheezes, rales.  Abdominal: Soft. BS +,  Obese abd  Musculoskeletal: some LE edema, palpable pulses   Lymphadenopathy: No lymphadenopathy noted, cervical, inguinal. Neuro: Alert. Normal reflexes, muscle tone coordination. No cranial nerve deficit. Skin: right arm redness and swelling seems to be improving over past 48 hours  Psychiatric: Normal mood and affect. Behavior, judgment, thought content normal.     Data Reviewed: I have personally reviewed following labs and imaging studies  CBC: Recent Labs  Lab 07/24/17 1528  07/25/17 0247 07/27/17 0344 07/28/17 0452 07/29/17 0625 07/30/17 0502  WBC 23.4*  --  16.2* 10.1 8.2 8.3 8.5  NEUTROABS 19.7*  --   --   --   --   --   --   HGB 8.3*   < > 7.2* 8.0* 7.7* 7.6* 7.5*  HCT 27.8*   < > 24.2* 25.3* 24.7* 24.5* 24.7*  MCV 93.9  --  94.2 92.0 89.2 88.8 88.8  PLT 295  --  169 180 175 163 187   < > = values in this interval not displayed.   Basic Metabolic Panel: Recent Labs  Lab 07/24/17 2134 07/25/17 0247 07/27/17 0344 07/28/17 0452 07/28/17 1305 07/29/17 0625 07/30/17 0502  NA  --  139 137 137  --  133* 133*  K  --  4.2 3.1* 3.0*  --  3.3* 4.3  CL  --  111 109 104  --  100* 102  CO2  --  20* 20* 23  --  25 25  GLUCOSE  --  103* 98 98  --   104* 93  BUN  --  32* 15 12  --  6 8  CREATININE  --  1.90* 0.88 0.76  --  0.62 0.60  CALCIUM  --  7.4* 7.9* 8.0*  --  7.8* 7.9*  MG 1.8 1.8  --   --  1.5*  --  1.9  PHOS 4.6 4.8*  --   --   --   --   --    GFR: Estimated Creatinine Clearance: 85.3 mL/min (by C-G formula based on SCr of 0.6 mg/dL). Liver Function Tests: Recent Labs  Lab 07/24/17 1528 07/25/17 0247  AST 21 20  ALT 13* 13*  ALKPHOS 97 82  BILITOT 0.2* 0.4  PROT 6.2* 5.5*  ALBUMIN 2.5* 2.1*   Recent Labs  Lab  07/24/17 1528  LIPASE 66*   No results for input(s): AMMONIA in the last 168 hours. Coagulation Profile: Recent Labs  Lab 07/24/17 1528  INR 2.05   Cardiac Enzymes: Recent Labs  Lab 07/25/17 0919  TROPONINI 0.17*   BNP (last 3 results) No results for input(s): PROBNP in the last 8760 hours. HbA1C: No results for input(s): HGBA1C in the last 72 hours. CBG: Recent Labs  Lab 07/29/17 2009 07/29/17 2146 07/30/17 0049 07/30/17 0442 07/30/17 0756  GLUCAP 67 79 84 92 84   Lipid Profile: No results for input(s): CHOL, HDL, LDLCALC, TRIG, CHOLHDL, LDLDIRECT in the last 72 hours. Thyroid Function Tests: No results for input(s): TSH, T4TOTAL, FREET4, T3FREE, THYROIDAB in the last 72 hours. Anemia Panel: No results for input(s): VITAMINB12, FOLATE, FERRITIN, TIBC, IRON, RETICCTPCT in the last 72 hours. Urine analysis:    Component Value Date/Time   COLORURINE AMBER (A) 07/24/2017 2143   APPEARANCEUR CLOUDY (A) 07/24/2017 2143   LABSPEC 1.015 07/24/2017 2143   PHURINE 6.0 07/24/2017 2143   GLUCOSEU NEGATIVE 07/24/2017 2143   HGBUR MODERATE (A) 07/24/2017 2143   BILIRUBINUR NEGATIVE 07/24/2017 2143   [place] NEGATIVE 07/24/2017 2143   PROTEINUR 30 (A) 07/24/2017 2143   NITRITE NEGATIVE 07/24/2017 2143   LEUKOCYTESUR LARGE (A) 07/24/2017 2143   Sepsis Labs: @LABRCNTIP (procalcitonin:4,lacticidven:4)   Recent Results (from the past 240 hour(s))  Culture, blood (routine x 2)      Status: None   Collection Time: 07/24/17  2:56 PM  Result Value Ref Range Status   Specimen Description BLOOD RIGHT NECK  Final   Special Requests   Final    BOTTLES DRAWN AEROBIC AND ANAEROBIC Blood Culture adequate volume   Culture   Final    NO GROWTH 5 DAYS Performed at [facility], [place] [address], [place], [place] [address]    Report Status 07/29/2017 FINAL  Final  Culture, blood (routine x 2)     Status: None   Collection Time: 07/24/17  3:01 PM  Result Value Ref Range Status   Specimen Description BLOOD LEFT FOREARM  Final   Special Requests   Final    BOTTLES DRAWN AEROBIC AND ANAEROBIC Blood Culture adequate volume   Culture   Final    NO GROWTH 5 DAYS Performed at [facility], [place] [address], [place], [place] [address]    Report Status 07/29/2017 FINAL  Final  Culture, Urine     Status: Abnormal   Collection Time: 07/25/17 10:40 AM  Result Value Ref Range Status   Specimen Description URINE, CATHETERIZED  Final   Special Requests   Final    NONE Performed at [facility], [place] [address] [place], [place] [address]    Culture MULTIPLE SPECIES PRESENT, SUGGEST RECOLLECTION (A)  Final   Report Status 07/26/2017 FINAL  Final     Radiology Studies: Ct Abdomen Pelvis Wo Contrast Result Date: 07/24/2017 1. Markedly distended bladder, extending above the umbilicus. This may be contributing to the patient's renal failure. Foley catheter placement would be helpful, as deemed clinically appropriate. 2. Small bilateral pleural effusions. Patchy bibasilar airspace opacities raise concern for pneumonia. 3. Hepatic masses measure up to 5.5 cm in size, reflecting known metastatic disease to the liver. 4. Vague 2.9 cm hypodensity at the inferior aspect of the spleen may also reflect metastatic disease. 5. Nonobstructing bilateral renal stones measure up to 4 mm in size. 6. Cholelithiasis.  Gallbladder otherwise unremarkable. 7. Scattered diverticulosis along the  descending and sigmoid colon, without evidence of diverticulitis. 8. Small umbilical hernia, containing only fat. 9. Trace pericardial effusion noted.  Dg Chest Port 1 View Result Date: 07/24/2017 Resolving pneumonia or edema.  Small bilateral pleural effusions.   Scheduled Meds: . amiodarone  400 mg Oral Daily  . aspirin  81 mg Oral Daily  . atorvastatin  40 mg Oral q1800  . feeding supplement  237 mL Oral Q1500  . feeding supplement   30 mL Oral Daily  . heparin  5,000 Units Subcutaneous Q8H  . Lorlatinib  100 mg Oral Daily   Continuous Infusions: . dextrose 5 % and 0.9% NaCl 75 mL/hr at 07/26/17 0049  . piperacillin-tazobactam (ZOSYN)  IV 3.375 g (07/26/17 2145)     LOS: 6 days    Time spent: 25 minutes Greater than 50% of the time spent on counseling and coordinating the care.   Leisa Lenz, MD [facility] Pager [phone]  If 7PM-7AM, please contact night-coverage [email or web address] Password [facility] 07/30/2017, 2:47 PM

## 2017-07-30 NOTE — Progress Notes (Signed)
  Speech Language Pathology Treatment: Dysphagia  Patient Details Name: Jennifer Delgado MRN: [id number] DOB: 04/01/1951 Today's Date: 07/30/2017 Time: 0722-5750 SLP Time Calculation (min) (ACUTE ONLY): 17 min  Assessment / Plan / Recommendation Clinical Impression  Pt consumed thin water via straw with only one delayed cough. Her voice remains dysphonic, but her intensity and cough appear to be mildly improved. Pt denies any coughing during meals with thickened liquids, and she believes that her coughing with water has greatly reduced since she has been able to coordinate her chin tuck better. SLP provided only Min cues for appropriate timing and positioning today. Recommend continuing with Dys 3 diet and nectar thick liquids, allowing water in between meals after oral care. Would still use the chin tuck with all liquids.   HPI HPI: 89 yoF presenting from SNF with nausea, decreased PO intake, abd discomfort since recent discharge (4/19) with AMS after being off oxygen. Pt admitted with hypovolemic shock with HTN requiring pressors, acute on chronic respiratory failure. Pt was seen during recent admission with evidence of post-extubation dysphagia, weak cough and dysphonic voice. Pt was most recently recommended to have Dys 3 diet and nectar thick liquids with use of chin tuck; thin liquids via provale cup only (MBS 4/18).       SLP Plan  Continue with current plan of care       Recommendations  Diet recommendations: Dysphagia 3 (mechanical soft);Nectar-thick liquid;Other(comment)(thin water okay in between meals) Liquids provided via: Straw Medication Administration: Crushed with puree Supervision: Patient able to self feed;Intermittent supervision to cue for compensatory strategies Compensations: Slow rate;Small sips/bites;Chin tuck Postural Changes and/or Swallow Maneuvers: Seated upright 90 degrees;Chin tuck                Oral Care Recommendations: Oral care BID;Oral care prior to  ice chip/H20 Follow up Recommendations: Skilled Nursing facility SLP Visit Diagnosis: Dysphagia, oropharyngeal phase (R13.12) Plan: Continue with current plan of care       GO                Germain Osgood 07/30/2017, 4:35 PM  Germain Osgood, M.A. CCC-SLP [phone]

## 2017-07-30 NOTE — Plan of Care (Signed)
  Problem: Clinical Measurements: Goal: Ability to maintain clinical measurements within normal limits will improve Outcome: Progressing   Problem: Clinical Measurements: Goal: Diagnostic test results will improve Outcome: Progressing   Problem: Clinical Measurements: Goal: Respiratory complications will improve Outcome: Progressing   Problem: Clinical Measurements: Goal: Cardiovascular complication will be avoided Outcome: Progressing

## 2017-07-30 NOTE — Progress Notes (Signed)
Patient dislodged the IV line in her right neck this am. IV was found on floor.  No issues with IV site. Small amount of drainage noted on sheet when turning patient.  IV site sealed over. No active drainage at this time. 2x2 gauze with tegaderm placed to site.  Left upper arm IV site currently in use and patent.

## 2017-07-30 NOTE — Progress Notes (Signed)
Physician notified that pt dislodged IV to right neck

## 2017-07-31 LAB — BASIC METABOLIC PANEL
Anion gap: 4 — ABNORMAL LOW (ref 5–15)
CO2: 25 mmol/L (ref 22–32)
Calcium: 8 mg/dL — ABNORMAL LOW (ref 8.9–10.3)
Chloride: 104 mmol/L (ref 101–111)
Creatinine, Ser: 0.68 mg/dL (ref 0.44–1.00)
GFR calc Af Amer: >60 mL/min (ref >60)
GLUCOSE: 86 mg/dL (ref 65–99)
POTASSIUM: 4.3 mmol/L (ref 3.5–5.1)
Sodium: 133 mmol/L — ABNORMAL LOW (ref 135–145)

## 2017-07-31 LAB — CBC
HCT: 23.9 % — ABNORMAL LOW (ref 36.0–46.0)
Hemoglobin: 7.2 g/dL — ABNORMAL LOW (ref 12.0–15.0)
MCH: 26.7 pg (ref 26.0–34.0)
MCHC: 30.1 g/dL (ref 30.0–36.0)
MCV: 88.5 fL (ref 78.0–100.0)
PLATELETS: UNDETERMINED 10*3/uL (ref 150–400)
RBC: 2.7 MIL/uL — AB (ref 3.87–5.11)
RDW: 16.9 % — ABNORMAL HIGH (ref 11.5–15.5)
WBC: 7.4 10*3/uL (ref 4.0–10.5)

## 2017-07-31 LAB — GLUCOSE, CAPILLARY
Glucose-Capillary: 77 mg/dL (ref 65–99)
Glucose-Capillary: 95 mg/dL (ref 65–99)
Glucose-Capillary: 85 mg/dL (ref 65–99)
Glucose-Capillary: 72 mg/dL (ref 65–99)

## 2017-07-31 MED ORDER — SERTRALINE HCL 100 MG PO TABS
100.0000 mg | ORAL_TABLET | Freq: Every day | ORAL | Status: DC
  Administered 2017-07-31 – 2017-08-03 (×4): 100 mg via ORAL
  Filled 2017-07-31 (×4): qty 1

## 2017-07-31 MED ORDER — AMIODARONE HCL 200 MG PO TABS: 200.0000 mg | ORAL_TABLET | Freq: Every day | ORAL | 0 refills | Status: AC

## 2017-07-31 NOTE — Discharge Summary (Addendum)
Physician Discharge Summary  Everlena Mackley ZDG:[id number] DOB: October 29, 1950 DOA: 07/24/2017  PCP: Daylene Posey, FNP  Admit date: 07/24/2017 Discharge date: 08/01/2017  Admitted From: SNF Disposition:  SNF   Recommendations for Outpatient Follow-up:  1. Follow up with PCP in 1 week 2. Follow up with Cardiology in 1 week  3. Follow up with Oncology at [facility]  4. Monitor CBC intermittently for chronic normocytic anemia  Discharge Condition: Stable CODE STATUS: Full  Diet recommendation: Dysphagia 3 diet   Brief/Interim Summary: Winna Golla is a 67 year old female with past medical history of non-small cell stage IV carcinoma (diagnosed October 2016, adenocarcinoma with positive ALK gene translocation s/p treatment w/ Viviann Spare and Alectnib) with progression of metastasis to the brain, hypertension, obesity, chronic hypoxic resp failure on baseline 2L home oxygen presenting from SNF with altered mental status and hypotension.    Recently hospitalized from 3/28 to 4/19 with hyponatremia and possibly pneumonia.  Hospital course complicated by V. fib arrest with STEMI on 4/1, respiratory failure, and AKI.  She was not a candidate for invasive procedures and treated medically.  10 day course of zosyn then rocephin completed for pneumonia.  Additionally patient had ongoing nausea and vomiting without signs of obstruction or ilues on abdominal xray.  She was found to have vocal cord weakness and dysphagia following extubation.  She was placed on a dysphagia 3 diet per speech recommendations.  Additionally evaluated by Oncology with recommendations to start Lorlatinib for 10 days.    Since discharge, patient and husband report very poor appetite with little PO intake, nausea, some constipation followed 3 large bowel movements, non-specific, and abdominal discomfort. Cdiff negative 4/17.  Denies fever but had ongoing cough and soft voice since extubation.  Denies chest pain or discomfort, or shortness of  breath.    States was getting her bath on day of admission and not wearing her oxygen for about 20 minutes when she initially became altered.  On EMS arrival, she was unresponsive and respiratory support given until patient pushed them away with her hands.  In the ER, she was sleepy but alert and oriented.  Found to be hypotensive with BP 55/29, afebrile, HR 82, respirations unlabored.  She was given 3L NS and then placed on low dose levophed peripherally.  Labs noted for WBC 23.4, sCr 2.6 (discharge sCr 0.85), troponin 0.21, EKG without acute changes, lactic acid 2.07, CXR noted for resolving edema/ pneumonia.  PCCM consulted for admission.    She was admitted 4/22 for acute encephalopathy and hypotension. She transiently required pressors but responded to fluids. She was noted to have AKI likely due to intravascular volume depletion. BP remained stable and she was transferred to hospitalist service. She was treated with 7 day IV unasyn for aspiration pneumonitis as well as right hand cellulitis, with improvement. On day of discharge, her mentation was at baseline with no new physical complaints or issues. She was discharged in stable condition to SNF.   Discharge Diagnoses:  Active Problems:   Shock ([place])   Pressure injury of skin   AKI (acute kidney injury) ([place])  Hypotensive shock  - Likely due to intravascular volume depletion - Treated with IV fluids for few days then given lasix for fluid overload - Now off of IV fluids and lasix  - Off pressor support - BP remains stable   Chronic wounds to left anterior and posterior calf related to lipodermatosclerosis - Pt uses Drawtex dressings to absorb drainage, which are not available in  the [place]. She uses Surepress for compression wrapswhich arealso not available. - Wound type:Multiple full thickness wounds Measurement:Affectedpatchyarea to anteriorand posterior legs are 9X4X.2cm, 3X2X.2cm and 15X6X.2cm,seperated by  narrow skin bridges. - Bedside nurse to change dressings Q day. Mepitel contact layersto protect skin graft sitesfrom adherence of dressing and decrease discomfort with dressing changes. Substitute Aquacel for Drawtex to provide antimicrobial benefits and absorb drainage. ABD pads and kerlex, then ace wrap for light compression, since Surepress is not available.   Right upper extremity erythema and swelling probable cellulitis  - Doppler study 4/25 was negative for deep veins thrombosis. Positive for Cephalic and Basilic veins on forearm segments and AC fossa area. Severe edema noted.  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  - Per oncology - Outpt follow up  - Plan to start ALK inh on outpt basis  Anemia of chronic disease - Due to malignancy - Hgb stable   Moderate protein calorie malnutrition - In the context of chronic illness - Continue nutritional supplementation    Discharge Instructions  Discharge Instructions    Increase activity slowly   Complete by:  As directed      Allergies as of 08/01/2017      Reactions   Daptomycin Other (See Comments)   Rhyabdomyolosis (a breakdown of muscle tissue that releases a damaging protein into the blood)   Dilaudid [hydromorphone] Nausea And Vomiting      Medication List    STOP taking these medications   HYDROcodone-acetaminophen 10-325 MG tablet Commonly known as:  NORCO   ibuprofen 800 MG tablet Commonly known as:  ADVIL,MOTRIN     TAKE these medications   amiodarone 200 MG tablet Commonly known as:  PACERONE Take 1 tablet (200 mg total) by mouth  daily. What changed:    medication strength  how much to take   aspirin 81 MG chewable tablet Chew 1 tablet (81 mg total) by mouth daily.   atorvastatin 40 MG tablet Commonly known as:  LIPITOR Take 1 tablet (40 mg total) by mouth daily at 6 PM.   cetirizine 10 MG tablet Commonly known as:  ZYRTEC Take 10 mg by mouth at bedtime.   diclofenac sodium 1 % Gel Commonly known as:  VOLTAREN Apply 2 g topically every 6 (six) hours as needed (for pain).   diphenoxylate-atropine 2.5-0.025 MG tablet Commonly known as:  LOMOTIL Take 1 tablet by mouth 4 (four) times daily as needed for diarrhea or loose stools. What changed:  when to take this   furosemide 20 MG tablet Commonly known as:  LASIX Take 20 mg by mouth daily.   gabapentin 300 MG capsule Commonly known as:  NEURONTIN Take 900 mg by mouth at bedtime.   isosorbide mononitrate 30 MG 24 hr tablet Commonly known as:  IMDUR Take 30 mg by mouth daily.   lisinopril 10 MG tablet Commonly known as:  PRINIVIL,ZESTRIL Take 10 mg by mouth daily.   liver oil-zinc oxide 40 % ointment Commonly known as:  DESITIN Apply topically as needed for irritation.   Lorlatinib 100 MG Tabs Take 100 mg by mouth  daily.   metoprolol tartrate 25 MG tablet Commonly known as:  LOPRESSOR Take 1 tablet (25 mg total) by mouth 2 (two) times daily.   ondansetron 4 MG tablet Commonly known as:  ZOFRAN Take 4 mg by mouth every 8 (eight) hours as needed for nausea or vomiting.   OXYGEN Inhale 2 L into the lungs continuous.   pantoprazole 40 MG tablet Commonly known as:  PROTONIX Take 1 tablet (40 mg total) by mouth daily.   potassium chloride SA 20 MEQ tablet Commonly known as:  K-DUR,KLOR-CON Take 20 mEq by mouth daily.   PROAIR HFA 108 (90 Base) MCG/ACT inhaler Generic drug:  albuterol Inhale 2 puffs into the lungs every 6 (six) hours as needed for wheezing or shortness of breath.   sertraline 100 MG tablet Commonly known as:   ZOLOFT Take 1 tablet (100 mg total) by mouth daily.   triamcinolone cream 0.1 % Commonly known as:  KENALOG Apply 1 application topically every 12 (twelve) hours as needed (for rash following chemo).       Contact information for follow-up providers    Oncology at [facility] Follow up.        Croitoru, Mihai, MD. Schedule an appointment as soon as possible for a visit in 1 week(s).   Specialty:  Cardiology Contact information: [address] [place] [address] [place], [place], [place]. Schedule an appointment as soon as possible for a visit in 1 week(s).   Specialty:  Nurse Practitioner Contact information: [address] [place] [address] [phone]            Contact information for after-discharge care    [facility] .   Service:  Skilled Nursing Contact information: [address] [address] [phone]                 Allergies  Allergen Reactions  . Daptomycin Other (See Comments)    Rhyabdomyolosis (a breakdown of muscle tissue that releases a damaging protein into the blood)  . Dilaudid [Hydromorphone] Nausea And Vomiting    Consultations:  PCCM admit  Cardiology    Procedures/Studies: Ct Abdomen Pelvis Wo Contrast  Result Date: 07/24/2017 CLINICAL DATA:  Acute onset of lower abdominal pain, renal failure and shock. EXAM: CT ABDOMEN AND PELVIS WITHOUT CONTRAST TECHNIQUE: Multidetector CT imaging of the abdomen and pelvis was performed following the standard protocol without IV contrast. COMPARISON:  CT of the abdomen and pelvis performed 01/15/2015, and renal ultrasound performed 01/22/2015 FINDINGS: Lower chest: Small bilateral pleural effusions are noted. Patchy bibasilar airspace opacities raise concern for pneumonia. A trace pericardial effusion is noted. Hepatobiliary: A new vague 5.5 cm mass is noted at the right hepatic lobe. A smaller  3.6 cm mass is noted more superiorly. Per correlation with the patient's clinical history, this reflects known metastatic disease to the liver. Stones are seen dependently within the gallbladder. The gallbladder is otherwise unremarkable. The common bile duct remains normal in caliber. Pancreas: The pancreas is within normal limits. Spleen: A nonspecific vague 2.9 cm hypodensity is noted at the inferior aspect of the spleen. Decreased attenuation along the medial aspect of the spleen may reflect remote infarct. Adrenals/Urinary Tract: The adrenal glands are unremarkable in appearance. Nonspecific perinephric stranding is noted bilaterally. Scattered nonobstructing bilateral renal stones are seen, measuring up to 4 mm in size. There is no evidence of hydronephrosis. No obstructing  ureteral stones are identified. Stomach/Bowel: The stomach is unremarkable in appearance. The small bowel is within normal limits. The appendix is normal in caliber, without evidence of appendicitis. Scattered diverticulosis is noted along the descending and sigmoid colon, with contrast filled diverticula. The colon is otherwise unremarkable. Vascular/Lymphatic: The abdominal aorta is unremarkable in appearance. The inferior vena cava is grossly unremarkable. No retroperitoneal lymphadenopathy is seen. No pelvic sidewall lymphadenopathy is identified. Reproductive: The bladder is markedly distended, extending above the umbilicus. The uterus is somewhat compressed but otherwise unremarkable in appearance. No suspicious adnexal masses are seen. Other: A small umbilical hernia is noted, containing only fat. Musculoskeletal: No acute osseous abnormalities are identified. There is mild chronic loss of height at vertebral body T12. The visualized musculature is unremarkable in appearance. IMPRESSION: 1. Markedly distended bladder, extending above the umbilicus. This may be contributing to the patient's renal failure. Foley catheter placement  would be helpful, as deemed clinically appropriate. 2. Small bilateral pleural effusions. Patchy bibasilar airspace opacities raise concern for pneumonia. 3. Hepatic masses measure up to 5.5 cm in size, reflecting known metastatic disease to the liver. 4. Vague 2.9 cm hypodensity at the inferior aspect of the spleen may also reflect metastatic disease. 5. Nonobstructing bilateral renal stones measure up to 4 mm in size. 6. Cholelithiasis.  Gallbladder otherwise unremarkable. 7. Scattered diverticulosis along the descending and sigmoid colon, without evidence of diverticulitis. 8. Small umbilical hernia, containing only fat. 9. Trace pericardial effusion noted. These results were called by telephone at the time of interpretation on 07/24/2017 at 9:18 pm to Nursing on [id number], who verbally acknowledged these results. Electronically Signed   By: Garald Balding M.D.   On: 07/24/2017 21:25   Dg Chest 1 View  Result Date: 07/03/2017 CLINICAL DATA:  67 year old female with central line placement. EXAM: ABDOMEN - 1 VIEW; CHEST  1 VIEW COMPARISON:  Chest radiograph dated 07/03/2017 FINDINGS: Endotracheal tube above the carina in similar position. Enteric tube extends into the stomach with tip likely located in the distal stomach. There has been interval placement of a left IJ central venous line with tip over central SVC at the level of the right hilum. No pneumothorax. Cardiomegaly with interstitial edema with more confluent airspace opacity in the right lung most likely representing asymmetric pulmonary edema although pneumonia is not excluded. Clinical correlation is recommended. Overall interval worsening of the airspace disease compared to the prior radiograph. Probable small right pleural effusion. No acute osseous pathology. IMPRESSION: 1. Interval placement of a left IJ central line with tip over central SVC. No pneumothorax. Endotracheal tube above the carina and enteric tube likely in the distal stomach. 2.  Cardiomegaly with findings of CHF. Pneumonia is not excluded. Clinical correlation is recommended. Electronically Signed   By: Anner Crete M.D.   On: 07/03/2017 05:26   Dg Abd 1 View  Result Date: 07/06/2017 CLINICAL DATA:  67 year old female status post OG tube placement. EXAM: ABDOMEN - 1 VIEW COMPARISON:  07/03/2017.  Portable chest 0456 hours today. FINDINGS: Portable AP supine view at 1110 hours. Enteric tube courses to the left upper quadrant, tip at the level of the mid stomach, and the side hole projects at the level of the proximal gastric body. Paucity of bowel gas, nonobstructed gas pattern. Stable lung bases. No acute osseous abnormality identified. IMPRESSION: Enteric tube within the stomach, side hole at the level of the proximal gastric body. Electronically Signed   By: Genevie Ann M.D.   On: 07/06/2017 11:58  Dg Abd 1 View  Result Date: 07/03/2017 CLINICAL DATA:  67 year old female with central line placement. EXAM: ABDOMEN - 1 VIEW; CHEST  1 VIEW COMPARISON:  Chest radiograph dated 07/03/2017 FINDINGS: Endotracheal tube above the carina in similar position. Enteric tube extends into the stomach with tip likely located in the distal stomach. There has been interval placement of a left IJ central venous line with tip over central SVC at the level of the right hilum. No pneumothorax. Cardiomegaly with interstitial edema with more confluent airspace opacity in the right lung most likely representing asymmetric pulmonary edema although pneumonia is not excluded. Clinical correlation is recommended. Overall interval worsening of the airspace disease compared to the prior radiograph. Probable small right pleural effusion. No acute osseous pathology. IMPRESSION: 1. Interval placement of a left IJ central line with tip over central SVC. No pneumothorax. Endotracheal tube above the carina and enteric tube likely in the distal stomach. 2. Cardiomegaly with findings of CHF. Pneumonia is not excluded.  Clinical correlation is recommended. Electronically Signed   By: Anner Crete M.D.   On: 07/03/2017 05:26   Ct Head Wo Contrast  Result Date: 07/03/2017 CLINICAL DATA:  67 y/o F; history of metastatic non-small cell cancer with brain metastasis. Cardiac arrest. EXAM: CT HEAD WITHOUT CONTRAST TECHNIQUE: Contiguous axial images were obtained from the base of the skull through the vertex without intravenous contrast. COMPARISON:  12/31/2017 CT of the head. FINDINGS: Brain: New small areas of cortical hypoattenuation in the right parietal lobe (series 2: Image 21), right occipital lobe 2:15, left posterior temporal lobe 2:14, right frontal lobe 2:13, and bilateral cerebellar hemispheres 2:9 and 8. Cortical and basal ganglia gray-white differentiation is maintained at this time. No hemorrhage or focal mass effect. Stable small lucencies in the left frontal lobe. No extra-axial collection, hydrocephalus, or effacement of basilar cisterns. Stable mild brain parenchymal volume loss. Vascular: Calcific atherosclerosis of carotid siphons. No hyperdense vessel. Skull: Normal. Negative for fracture or focal lesion. Sinuses/Orbits: No acute finding. Other: None. IMPRESSION: 1. Several new small areas of hypoattenuation are present in right frontal, right parietal, right occipital, left posterior temporal lobes as well as bilateral cerebellar hemispheres, probably representing interval infarctions. Multiple vascular territories suggests embolic phenomenon. 2. Stable lucency in left frontal lobe probably representing chronic microvascular ischemic change, possibly treated metastasis. These results will be called to the ordering clinician or representative by the Radiologist Assistant, and communication documented in the PACS or zVision Dashboard. Electronically Signed   By: Kristine Garbe M.D.   On: 07/03/2017 18:00   Ct Chest Wo Contrast  Result Date: 07/03/2017 CLINICAL DATA:  Metastatic non-small cell lung  cancer with metastasis to brain, hospitalized with hyponatremia, nausea, and vomiting, underwent cardiac arrest with CPR EXAM: CT CHEST WITHOUT CONTRAST TECHNIQUE: Multidetector CT imaging of the chest was performed following the standard protocol without IV contrast. Sagittal and coronal MPR images reconstructed from axial data set. Extensive beam hardening artifacts from inclusion of patient's arms in imaged field. COMPARISON:  None FINDINGS: Cardiovascular: Aorta normal caliber. Minimal pericardial effusion. Heart otherwise unremarkable. LEFT jugular line with tip in SVC. Mediastinum/Nodes: Tip of endotracheal tube above carina. Nasogastric tube coiled in stomach. Esophagus unremarkable. No definite thoracic adenopathy though hilar assessment is limited by the lack of IV contrast. Base of cervical region grossly unremarkable. Lungs/Pleura: Extensive BILATERAL pulmonary infiltrates greatest in LEFT lower lobe. Small to moderate RIGHT pleural effusion. Unable to exclude underlying mass lesions due to the severity of observed infiltrates. Upper  Abdomen: Visualized upper abdomen unremarkable Musculoskeletal: BILATERAL anterior rib fractures which may related to CPR. Bones demineralized. Mid sternal fracture. IMPRESSION: Extensive BILATERAL pulmonary infiltrates which could be related to infection or aspiration. Small to moderate low-attenuation RIGHT pleural effusion. BILATERAL rib and sternal fractures question related to CPR/resuscitation. Small pericardial effusion. Electronically Signed   By: Lavonia Dana M.D.   On: 07/03/2017 18:01   Mr Jeri Cos SA Contrast  Result Date: 07/12/2017 CLINICAL DATA:  Follow-up stroke. History of metastatic lung cancer and recent cardiac arrest. EXAM: MRI HEAD WITHOUT AND WITH CONTRAST TECHNIQUE: Multiplanar, multiecho pulse sequences of the brain and surrounding structures were obtained without and with intravenous contrast. CONTRAST:  34m MULTIHANCE GADOBENATE DIMEGLUMINE 529  MG/ML IV SOLN COMPARISON:  CT HEAD July 03, 2017. FINDINGS: INTRACRANIAL CONTENTS: Patchy reduced diffusion bilateral frontoparietal lobes and LEFT greater than RIGHT occipital lobes with nearly normalized ADC values, wispy enhancement and FLAIR T2 hyperintense signal. In addition, multiple less than 5 mm solidly enhancing and slight subcentimeter rim enhancing nodules numbering at least 15 in the supra-and infratentorial brain predominately at gray-white matter junction. No susceptibility artifact to suggest hemorrhage. No midline shift or mass effect. Additional. Very minimal suspected chronic small vessel ischemic disease. Mild parenchymal brain volume loss without hydrocephalus. No abnormal extra-axial fluid collections or masses. VASCULAR: Normal major intracranial vascular flow voids present at skull base. SKULL AND UPPER CERVICAL SPINE: No abnormal sellar expansion. Heterogeneous calvarial bone marrow signal without focal signal abnormality or enhancement. Craniocervical junction maintained. SINUSES/ORBITS: Trace paranasal sinus mucosal thickening. Hypoplastic bilateral mastoid air cells with effusion. Nasal septum perforation. The included ocular globes and orbital contents are non-suspicious. OTHER: None. IMPRESSION: 1. Numerous supra- and infratentorial early subacute mildly enhancing infarcts attributable to hypoperfusion/cardiac arrest, though also seen with embolic phenomena. 2. Numerous small supra- and infratentorial metastasis. 3. Mild parenchymal brain volume loss. Electronically Signed   By: CElon AlasM.D.   On: 07/12/2017 15:18   Dg Chest Port 1 View  Result Date: 07/24/2017 CLINICAL DATA:  Unresponsive. EXAM: PORTABLE CHEST 1 VIEW COMPARISON:  07/12/2017 and CT chest 07/03/2017. FINDINGS: Trachea is midline. Heart size stable. Mild mixed interstitial and airspace opacification. Small bilateral pleural effusions. IMPRESSION: Resolving pneumonia or edema.  Small bilateral pleural  effusions. Electronically Signed   By: MLorin PicketM.D.   On: 07/24/2017 15:58   Dg Chest Port 1 View  Result Date: 07/12/2017 CLINICAL DATA:  Respiratory failure EXAM: PORTABLE CHEST 1 VIEW COMPARISON:  07/11/2017 FINDINGS: Cardiac shadow is stable. Endotracheal tube, nasogastric catheter and left jugular central line are again seen and stable. Persistent right-sided pleural effusion with basilar atelectasis. Is seen. Left basilar atelectasis has increased in the interval. IMPRESSION: Increasing changes in the left base. Electronically Signed   By: MInez CatalinaM.D.   On: 07/12/2017 07:44   Dg Chest Port 1 View  Result Date: 07/11/2017 CLINICAL DATA:  Respiratory failure. EXAM: PORTABLE CHEST 1 VIEW COMPARISON:  07/10/2017. FINDINGS: Endotracheal tube and orogastric tube in stable position. Left IJ line stable position. Left IJ line Skene at the insertion site. Heart size stable. Persistent but improved bilateral pulmonary infiltrates/edema. Persistent but improved small right pleural effusion. No pneumothorax. IMPRESSION: 1.  Lines and tubes in stable position. 2. Persistent but improved bilateral pulmonary infiltrates/edema. Persistent but improved small right-sided pleural effusion Electronically Signed   By: TCherry Hill  On: 07/11/2017 07:24   Dg Chest Port 1 View  Result Date: 07/10/2017 CLINICAL DATA:  Respiratory  failure EXAM: PORTABLE CHEST 1 VIEW COMPARISON:  Yesterday FINDINGS: Endotracheal tube tip at the clavicular heads. An orogastric tube reaches the stomach at least. Left IJ catheter, tip at the SVC origin. Diffuse airspace opacity with layering pleural fluid on the right. Chronic cardiomegaly. No visible pneumothorax. No convincing improvement. IMPRESSION: 1. Stable positioning of hardware. 2. Extensive airspace disease with layering pleural fluid on the right. No interval change. Electronically Signed   By: Monte Fantasia M.D.   On: 07/10/2017 07:08   Dg Chest Port 1  View  Result Date: 07/09/2017 CLINICAL DATA:  Respiratory failure EXAM: PORTABLE CHEST 1 VIEW COMPARISON:  07/08/2017 FINDINGS: Endotracheal tube in good position. NG tube enters the stomach with the tip not visualized. Left jugular central venous catheter tip in the SVC. No pneumothorax Severe diffuse bilateral airspace disease shows mild progression. Bilateral pleural effusions. IMPRESSION: Endotracheal tube remains in good position Severe diffuse bilateral airspace disease with mild progression. Electronically Signed   By: Franchot Gallo M.D.   On: 07/09/2017 08:11   Dg Chest Port 1 View  Result Date: 07/08/2017 CLINICAL DATA:  Pneumonia EXAM: PORTABLE CHEST 1 VIEW COMPARISON:  07/07/2017 FINDINGS: Endotracheal tube in good position. Left jugular central venous catheter tip in the SVC unchanged in position. NG tube enters the stomach. Severe diffuse bilateral airspace disease with progression. Mild to moderate right pleural effusion unchanged. Bibasilar volume loss right greater than left unchanged IMPRESSION: Support lines remain in satisfactory position Severe bilateral airspace disease with progression. Electronically Signed   By: Franchot Gallo M.D.   On: 07/08/2017 07:13   Dg Chest Port 1 View  Result Date: 07/07/2017 CLINICAL DATA:  Hypoxia EXAM: PORTABLE CHEST 1 VIEW COMPARISON:  July 06, 2017 FINDINGS: Endotracheal tube tip is 3.9 cm above the carina. Left jugular catheter tip is at the junction of the left innominate vein and superior vena cava. Nasogastric tube tip and side port below the diaphragm. No pneumothorax. There are pleural effusions bilaterally, larger on the right than on the left. There is moderate interstitial and patchy alveolar edema, stable. There is cardiomegaly with pulmonary venous hypertension. No evident adenopathy. No bone lesions. IMPRESSION: Tube and catheter positions as described without pneumothorax. Pulmonary vascular congestion with pleural effusions bilaterally and  diffuse pulmonary edema. Suspect underlying congestive heart failure. Cannot exclude superimposed pneumonia in areas of alveolar opacity. There may well be pneumonia and congestive heart failure present concurrently. Electronically Signed   By: Lowella Grip III M.D.   On: 07/07/2017 07:32   Dg Chest Port 1 View  Result Date: 07/06/2017 CLINICAL DATA:  Respiratory failure EXAM: PORTABLE CHEST 1 VIEW COMPARISON:  07/05/2017 FINDINGS: Endotracheal tube in good position. Left jugular catheter tip in the SVC unchanged. NG in the stomach. Diffuse bilateral airspace disease unchanged. Progression of right lower lobe atelectasis/infiltrate. No change in left lower lobe atelectasis. IMPRESSION: Diffuse bilateral airspace disease unchanged most likely edema Bibasilar atelectasis. Progression of right lower lobe atelectasis and right effusion. Electronically Signed   By: Franchot Gallo M.D.   On: 07/06/2017 07:47   Dg Chest Port 1 View  Result Date: 07/05/2017 CLINICAL DATA:  Respiratory failure. EXAM: PORTABLE CHEST 1 VIEW COMPARISON:  One-view chest x-ray 07/04/2017 FINDINGS: Heart is enlarged. Endotracheal tube left IJ line and NG tube are stable. Interstitial edema and bilateral effusions are unchanged. Bibasilar airspace disease likely reflects atelectasis. IMPRESSION: 1. Stable appearance of congestive heart failure. 2. Support apparatus is stable. Electronically Signed   By: San Morelle  M.D.   On: 07/05/2017 08:58   Dg Chest Port 1 View  Result Date: 07/04/2017 CLINICAL DATA:  resp failure EXAM: PORTABLE CHEST 1 VIEW COMPARISON:  Chest radiograph and CT 07/03/2017. FINDINGS: ET tube 4.5 cm above carina. BILATERAL pulmonary opacities are improved. Significant residual lower lobe opacities remain. Orogastric tube tip not visualized. No pneumothorax. Rib fractures better demonstrated on CT. IMPRESSION: Improved aeration. Electronically Signed   By: Staci Righter M.D.   On: 07/04/2017 07:03   Dg  Chest Port 1 View  Result Date: 07/03/2017 CLINICAL DATA:  Endotracheal tube placement. Dyspnea and respiratory failure. EXAM: PORTABLE CHEST 1 VIEW COMPARISON:  None. FINDINGS: Portable AP supine view of the chest. Cardiomegaly with interstitial edema. Slightly more confluent airspace opacities in the right upper and lower lung. Endotracheal tube tip is 4.2 cm above the carina in satisfactory position. External defibrillator paddles project over the cardiac silhouette. No acute osseous abnormality. IMPRESSION: 1. Endotracheal tube tip is satisfactory in position approximately 4.2 cm above the carina. 2. Cardiomegaly with interstitial edema. Slightly more confluent airspace opacity in the right upper and lower. Superimposed CHF or pneumonia may contribute to this appearance. Electronically Signed   By: Ashley Royalty M.D.   On: 07/03/2017 03:19   Dg Abd 2 Views  Result Date: 07/20/2017 CLINICAL DATA:  Abdominal pain EXAM: ABDOMEN - 2 VIEW COMPARISON:  Abdominal x-rays dated 07/06/2017 and 07/03/2017. FINDINGS: On today's exam, the overall bowel gas pattern is nonobstructive. Contrast from previous fluoroscopic swallow examination is seen in the colon and rectum. No evidence of soft tissue mass or abnormal fluid collection. No evidence of free intraperitoneal air. No acute or suspicious osseous finding. IMPRESSION: No acute findings.  Nonobstructive bowel gas pattern. Electronically Signed   By: Franki Cabot M.D.   On: 07/20/2017 13:43   Dg Swallowing Func-speech Pathology  Result Date: 07/20/2017 Objective Swallowing Evaluation: Type of Study: MBS-Modified Barium Swallow Study  Patient Details Name: Shaneta Cervenka MRN: [id number] Date of Birth: 1950-07-17 Today's Date: 07/20/2017 Time: SLP Start Time (ACUTE ONLY): 1415 -SLP Stop Time (ACUTE ONLY): 1440 SLP Time Calculation (min) (ACUTE ONLY): 25 min Past Medical History: Past Medical History: Diagnosis Date . Brain cancer ([place])  . Cancer ([place])   lung . Cellulitis   . Hypertension  . Liver cancer ([place])  . Renal disorder  Past Surgical History: Past Surgical History: Procedure Laterality Date . BRONCHOSCOPY   . LEG SURGERY   . THORACENTESIS   HPI: 66 y.o.femalewith a hx of pulmonary edema after second chemo, with normal Echo, Metastatic lung cancer-stage IV-metatases to brain, sternum and spineulcerated lipodermatosclerosis of LLE, HTN. Admitted with nausea and vomiting and hyponatremia. V-fib arrest 07/03/17 due to STEMI. Respiratory failure due to aspiration (?) pneumonia. note intractibile nausea vomiting prior to admit. Intubated from 4/1-4/12  Pt reports improvement with nausea today.   Subjective: pt awake in chair Assessment / Plan / Recommendation CHL IP CLINICAL IMPRESSIONS 07/20/2017 Clinical Impression Limited amount of barium given due to pt's reported abdomen discomfort earlier today - DG abdomen negative for acute deficit prior to proceeding with MBS.  Pt presents with improved swallow function but continues with aspiration of secretions and thin liquids via cup/straw.   Trace penetration of liquids (nectar) cleared with cued throat clearing.   Aspiration continued due to decreased laryngeal closure and poor timing of swallow.  decreased tongue base retraction/oral propulsion.  Mild amount of pyriform sinus residuals present due to decreased laryngeal elevation.  Did not test solids/puree  today.  Delayed reflexive cough with aspiration (cough x1 only after aspirates likely reached carina) was not effective to clear airway.   Recommend dys3/ground meats/nectar liquid with ongoing precautions.  Recommend pt be allowed thin liquids via [place] cup independently *releases 5 cc of liquids only.  Again if pt is coughing with po = she is ASPIRATING.  Recommend follow up SLP at [facility] for dysphagia management/treatment.  Of note, prior to provided pt with barium - SLP examined oral cavity - noted subtle white spots on soft palate - concerning for oral candidiasis. - Thanks  for allowing me to help care for this pt.    SLP Visit Diagnosis Dysphagia, oropharyngeal phase (R13.12) Attention and concentration deficit following -- Frontal lobe and executive function deficit following -- Impact on safety and function Moderate aspiration risk   CHL IP TREATMENT RECOMMENDATION 07/20/2017 Treatment Recommendations Therapy as outlined in treatment plan below   Prognosis 07/20/2017 Prognosis for Safe Diet Advancement Good Barriers to Reach Goals -- Barriers/Prognosis Comment -- CHL IP DIET RECOMMENDATION 07/20/2017 SLP Diet Recommendations Other (Comment);Nectar thick liquid Liquid Administration via -- Medication Administration Crushed with puree Compensations Minimize environmental distractions;Small sips/bites;Slow rate;Effortful swallow;Chin tuck;Multiple dry swallows after each bite/sip Postural Changes --   CHL IP OTHER RECOMMENDATIONS 07/20/2017 Recommended Consults -- Oral Care Recommendations Other (Comment);Oral care BID Other Recommendations Have oral suction available;Order thickener from pharmacy;Clarify dietary restrictions   CHL IP FOLLOW UP RECOMMENDATIONS 07/20/2017 Follow up Recommendations Skilled Nursing facility   [facility] IP FREQUENCY AND DURATION 07/20/2017 Speech Therapy Frequency (ACUTE ONLY) min 2x/week Treatment Duration 2 weeks      CHL IP ORAL PHASE 07/20/2017 Oral Phase Impaired Oral - Pudding Teaspoon -- Oral - Pudding Cup -- Oral - Honey Teaspoon -- Oral - Honey Cup -- Oral - Nectar Teaspoon WFL Oral - Nectar Cup Premature spillage Oral - Nectar Straw Premature spillage Oral - Thin Teaspoon Premature spillage Oral - Thin Cup Premature spillage Oral - Thin Straw Premature spillage Oral - Puree NT Oral - Mech Soft NT Oral - Regular -- Oral - Multi-Consistency -- Oral - Pill NT Oral Phase - Comment --  CHL IP PHARYNGEAL PHASE 07/20/2017 Pharyngeal Phase Impaired Pharyngeal- Pudding Teaspoon -- Pharyngeal -- Pharyngeal- Pudding Cup -- Pharyngeal -- Pharyngeal- Honey Teaspoon --  Pharyngeal -- Pharyngeal- Honey Cup -- Pharyngeal -- Pharyngeal- Nectar Teaspoon Reduced laryngeal elevation;Pharyngeal residue - pyriform;Penetration/Aspiration during swallow;Reduced airway/laryngeal closure Pharyngeal Material enters airway, passes BELOW cords without attempt by patient to eject out (silent aspiration) Pharyngeal- Nectar Cup Pharyngeal residue - pyriform;Reduced laryngeal elevation;Reduced airway/laryngeal closure Pharyngeal -- Pharyngeal- Nectar Straw Pharyngeal residue - pyriform;Reduced laryngeal elevation;Reduced airway/laryngeal closure Pharyngeal -- Pharyngeal- Thin Teaspoon Reduced laryngeal elevation;Reduced airway/laryngeal closure;Penetration/Aspiration during swallow Pharyngeal Material enters airway, passes BELOW cords without attempt by patient to eject out (silent aspiration) Pharyngeal- Thin Cup Penetration/Aspiration during swallow;Penetration/Apiration after swallow;Reduced laryngeal elevation;Reduced airway/laryngeal closure;Trace aspiration Pharyngeal Material enters airway, passes BELOW cords without attempt by patient to eject out (silent aspiration) Pharyngeal- Thin Straw Reduced laryngeal elevation;Reduced airway/laryngeal closure;Trace aspiration;Penetration/Aspiration during swallow Pharyngeal Material enters airway, passes BELOW cords without attempt by patient to eject out (silent aspiration) Pharyngeal- Puree NT Pharyngeal -- Pharyngeal- Mechanical Soft NT Pharyngeal -- Pharyngeal- Regular -- Pharyngeal -- Pharyngeal- Multi-consistency -- Pharyngeal -- Pharyngeal- Pill NT Pharyngeal -- Pharyngeal Comment throat clear improved laryngeal clearance of barium compared to cough, pt does not sense mild residuals mixed with secretions at pyriform sinus, aspiration of secretions noted (silent)  CHL IP CERVICAL ESOPHAGEAL PHASE 07/20/2017 Cervical  Esophageal Phase Impaired Pudding Teaspoon -- Pudding Cup -- Honey Teaspoon -- Honey Cup -- Nectar Teaspoon -- Nectar Cup -- Nectar  Straw -- Thin Teaspoon -- Thin Cup -- Thin Straw -- Puree -- Mechanical Soft -- Regular -- Multi-consistency -- Pill -- Cervical Esophageal Comment mild residuals at distal pharynx -pyriform sinus No flowsheet data found. Macario Golds 07/20/2017, 3:56 PM  Luanna Salk, [place] [facility] SLP [phone]             Dg Swallowing Func-speech Pathology  Result Date: 07/17/2017 Objective Swallowing Evaluation: Type of Study: MBS-Modified Barium Swallow Study  Patient Details Name: Akeria Hedstrom MRN: [id number] Date of Birth: 03-30-1951 Today's Date: 07/17/2017 Time: SLP Start Time (ACUTE ONLY): 1240 -SLP Stop Time (ACUTE ONLY): 1305 SLP Time Calculation (min) (ACUTE ONLY): 25 min Past Medical History: Past Medical History: Diagnosis Date . Brain cancer ([place])  . Cancer ([place])   lung . Cellulitis  . Hypertension  . Liver cancer ([place])  . Renal disorder  Past Surgical History: Past Surgical History: Procedure Laterality Date . BRONCHOSCOPY   . LEG SURGERY   . THORACENTESIS   HPI: 67 y.o.femalewith a hx of pulmonary edema after second chemo, with normal Echo, Metastatic lung cancer-stage IV-metatases to brain, sternum and spineulcerated lipodermatosclerosis of LLE, HTN. Admitted with nausea and vomiting and hyponatremia. V-fib arrest 07/03/17 due to STEMI. Respiratory failure due to aspiration (?) pneumonia. note intractibile nausea vomiting prior to admit. Intubated from 4/1-4/12  Subjective: pt awake in chair Assessment / Plan / Recommendation CHL IP CLINICAL IMPRESSIONS 07/17/2017 Clinical Impression Pt presents with moderately severe oropharyngeal dysphagia with resultant aspiration of thin, nectar liquids due to decreased laryngeal closure and poor timing of swallow.  Chin tuck with tsps nectar prevented aspiration.  Vallecular residuals present due to decreased tongue base retraction/oral propulsion.  Pt with inconsistent awareness to vallecular residuals.  Reflexive delayed cough with aspiration was not effective to clear  airway.   Barium tablet given with puree readily transited oropharynx to distal esophagus.  Recommend consider full liquid *nectar* diet with STRICT precautions (tsps with chin tuck) and full supervision.  If pt is coughing with po = she is ASPIRATING.  She will remain a risk due to her deconditioning and sensory deficits.  Hopeful for ongoing improvement as pt medically improves.  Recommended pt strengthen her cough/expectoration ability using teach back.  Dependent on her tolerance, she may benefit from repeat MBS.  Of note, pt asking if she can take her chemo pill today - advised her to speak to her MD.    SLP Visit Diagnosis Dysphagia, oropharyngeal phase (R13.12) Attention and concentration deficit following -- Frontal lobe and executive function deficit following -- Impact on safety and function Moderate aspiration risk   CHL IP TREATMENT RECOMMENDATION 07/17/2017 Treatment Recommendations Therapy as outlined in treatment plan below   Prognosis 07/17/2017 Prognosis for Safe Diet Advancement Guarded Barriers to Reach Goals Severity of deficits;Time post onset Barriers/Prognosis Comment -- CHL IP DIET RECOMMENDATION 07/17/2017 SLP Diet Recommendations Other (Comment);Nectar thick liquid Liquid Administration via -- Medication Administration Crushed with puree Compensations Minimize environmental distractions;Small sips/bites;Slow rate;Effortful swallow;Chin tuck;Multiple dry swallows after each bite/sip Postural Changes Remain semi-upright after after feeds/meals (Comment);Seated upright at 90 degrees   CHL IP OTHER RECOMMENDATIONS 07/17/2017 Recommended Consults -- Oral Care Recommendations Other (Comment);Oral care BID Other Recommendations Have oral suction available;Order thickener from pharmacy;Clarify dietary restrictions   CHL IP FOLLOW UP RECOMMENDATIONS 07/17/2017 Follow up Recommendations Skilled Nursing facility   [facility] IP  FREQUENCY AND DURATION 07/17/2017 Speech Therapy Frequency (ACUTE ONLY) min 2x/week  Treatment Duration 2 weeks      CHL IP ORAL PHASE 07/17/2017 Oral Phase Impaired Oral - Pudding Teaspoon -- Oral - Pudding Cup -- Oral - Honey Teaspoon -- Oral - Honey Cup -- Oral - Nectar Teaspoon Decreased bolus cohesion;Delayed oral transit;Lingual/palatal residue;Reduced posterior propulsion;Weak lingual manipulation Oral - Nectar Cup Decreased bolus cohesion;Lingual/palatal residue;Reduced posterior propulsion;Weak lingual manipulation;Delayed oral transit Oral - Nectar Straw Decreased bolus cohesion;Delayed oral transit;Lingual/palatal residue;Reduced posterior propulsion;Weak lingual manipulation Oral - Thin Teaspoon Decreased bolus cohesion;Lingual/palatal residue;Reduced posterior propulsion;Weak lingual manipulation;Delayed oral transit Oral - Thin Cup Decreased bolus cohesion;Lingual/palatal residue;Reduced posterior propulsion;Weak lingual manipulation;Delayed oral transit Oral - Thin Straw Decreased bolus cohesion;Lingual/palatal residue;Reduced posterior propulsion;Weak lingual manipulation;Delayed oral transit Oral - Puree Decreased bolus cohesion;Lingual/palatal residue;Reduced posterior propulsion;Weak lingual manipulation;Delayed oral transit Oral - Mech Soft Decreased bolus cohesion;Lingual/palatal residue;Reduced posterior propulsion;Weak lingual manipulation;Delayed oral transit Oral - Regular -- Oral - Multi-Consistency -- Oral - Pill Decreased bolus cohesion;Lingual/palatal residue;Reduced posterior propulsion;Weak lingual manipulation;Delayed oral transit Oral Phase - Comment --  CHL IP PHARYNGEAL PHASE 07/17/2017 Pharyngeal Phase Impaired Pharyngeal- Pudding Teaspoon -- Pharyngeal -- Pharyngeal- Pudding Cup -- Pharyngeal -- Pharyngeal- Honey Teaspoon -- Pharyngeal -- Pharyngeal- Honey Cup -- Pharyngeal -- Pharyngeal- Nectar Teaspoon Delayed swallow initiation-vallecula Pharyngeal -- Pharyngeal- Nectar Cup Delayed swallow initiation-vallecula Pharyngeal -- Pharyngeal- Nectar Straw Delayed  swallow initiation-vallecula;Penetration/Aspiration during swallow Pharyngeal Material enters airway, passes BELOW cords without attempt by patient to eject out (silent aspiration) Pharyngeal- Thin Teaspoon Penetration/Aspiration before swallow;Penetration/Aspiration during swallow;Pharyngeal residue - valleculae Pharyngeal Material enters airway, passes BELOW cords and not ejected out despite cough attempt by patient Pharyngeal- Thin Cup Penetration/Aspiration during swallow;Penetration/Aspiration before swallow;Pharyngeal residue - valleculae Pharyngeal Material enters airway, passes BELOW cords and not ejected out despite cough attempt by patient Pharyngeal- Thin Straw Delayed swallow initiation-vallecula;Reduced pharyngeal peristalsis;Pharyngeal residue - valleculae;Moderate aspiration Pharyngeal -- Pharyngeal- Puree Pharyngeal residue - valleculae;Reduced tongue base retraction;Reduced pharyngeal peristalsis;Reduced epiglottic inversion Pharyngeal -- Pharyngeal- Mechanical Soft Reduced tongue base retraction;Reduced pharyngeal peristalsis;Pharyngeal residue - valleculae Pharyngeal -- Pharyngeal- Regular -- Pharyngeal -- Pharyngeal- Multi-consistency -- Pharyngeal -- Pharyngeal- Pill Delayed swallow initiation-vallecula;Reduced tongue base retraction;Pharyngeal residue - valleculae Pharyngeal -- Pharyngeal Comment pt's cough is weak and not protective, "Hock" and expectoration also weak, cued dry swallows decrease residuals, chin tuck and cued strong swallow helpful with nectar - but not thin, as testing progressed, pt with increased residuals/penetration - concerning for fatigue  CHL IP CERVICAL ESOPHAGEAL PHASE 07/17/2017 Cervical Esophageal Phase WFL Pudding Teaspoon -- Pudding Cup -- Honey Teaspoon -- Honey Cup -- Nectar Teaspoon -- Nectar Cup -- Nectar Straw -- Thin Teaspoon -- Thin Cup -- Thin Straw -- Puree -- Mechanical Soft -- Regular -- Multi-consistency -- Pill -- Cervical Esophageal Comment -- No  flowsheet data found. Luanna Salk, [place] [facility] SLP [phone]              [place] Ekg Site Rite  Result Date: 07/13/2017 If Site Rite image not attached, placement could not be confirmed due to current cardiac rhythm.   Vascular US right upper extremity 4/25   Right: Ultrasound findings are unable to discriminate whether obstruction in the basilic vein and cephalic vein is acute or chronic.    Discharge Exam: Vitals:   07/31/17 2016 08/01/17 0500  BP: (!) 141/65 (!) 146/70  Pulse: 87 80  Resp: 20 18  Temp: 97.8 F (36.6 C) 97.6 F (36.4 C)  SpO2: 93% 98%  General: Pt is alert, awake, not in acute distress Cardiovascular: RRR, S1/S2 +, no rubs, no gallops Respiratory: CTA bilaterally, no wheezing, no rhonchi Abdominal: Soft, NT, ND, bowel sounds + Extremities: +right hand with some erythema but no increase in warmth or drainage    The results of significant diagnostics from this hospitalization (including imaging, microbiology, ancillary and laboratory) are listed below for reference.     Microbiology: Recent Results (from the past 240 hour(s))  Culture, blood (routine x 2)     Status: None   Collection Time: 07/24/17  2:56 PM  Result Value Ref Range Status   Specimen Description BLOOD RIGHT NECK  Final   Special Requests   Final    BOTTLES DRAWN AEROBIC AND ANAEROBIC Blood Culture adequate volume   Culture   Final    NO GROWTH 5 DAYS Performed at [facility], [address], [place], [place] [address]    Report Status 07/29/2017 FINAL  Final  Culture, blood (routine x 2)     Status: None   Collection Time: 07/24/17  3:01 PM  Result Value Ref Range Status   Specimen Description BLOOD LEFT FOREARM  Final   Special Requests   Final    BOTTLES DRAWN AEROBIC AND ANAEROBIC Blood Culture adequate volume   Culture   Final    NO GROWTH 5 DAYS Performed at [facility], [place] [address] [place], [place] [address]    Report Status 07/29/2017 FINAL  Final   Culture, Urine     Status: Abnormal   Collection Time: 07/25/17 10:40 AM  Result Value Ref Range Status   Specimen Description URINE, CATHETERIZED  Final   Special Requests   Final    NONE Performed at [facility], [place] [address], [place], [place] [address]    Culture MULTIPLE SPECIES PRESENT, SUGGEST RECOLLECTION (A)  Final   Report Status 07/26/2017 FINAL  Final     Labs: BNP (last 3 results) Recent Labs    07/24/17 2135 07/25/17 0247  BNP 684.8* 952.8*   Basic Metabolic Panel: Recent Labs  Lab 07/27/17 0344 07/28/17 0452 07/28/17 1305 07/29/17 0625 07/30/17 0502 07/31/17 0541  NA 137 137  --  133* 133* 133*  K 3.1* 3.0*  --  3.3* 4.3 4.3  CL 109 104  --  100* 102 104  CO2 20* 23  --  25 25 25   GLUCOSE 98 98  --  104* 93 86  BUN 15 12  --  6 8 <5*  CREATININE 0.88 0.76  --  0.62 0.60 0.68  CALCIUM 7.9* 8.0*  --  7.8* 7.9* 8.0*  MG  --   --  1.5*  --  1.9  --    Liver Function Tests: No results for input(s): AST, ALT, ALKPHOS, BILITOT, PROT, ALBUMIN in the last 168 hours. No results for input(s): LIPASE, AMYLASE in the last 168 hours. No results for input(s): AMMONIA in the last 168 hours. CBC: Recent Labs  Lab 07/27/17 0344 07/28/17 0452 07/29/17 0625 07/30/17 0502 07/31/17 0541  WBC 10.1 8.2 8.3 8.5 7.4  HGB 8.0* 7.7* 7.6* 7.5* 7.2*  HCT 25.3* 24.7* 24.5* 24.7* 23.9*  MCV 92.0 89.2 88.8 88.8 88.5  PLT 180 175 163 187 PLATELET CLUMPS NOTED ON SMEAR, UNABLE TO ESTIMATE   Cardiac Enzymes: No results for input(s): CKTOTAL, CKMB, CKMBINDEX, TROPONINI in the last 168 hours. BNP: Invalid input(s): POCBNP CBG: Recent Labs  Lab 07/30/17 2054 07/30/17 2344 07/31/17 4132  07/31/17 0731 07/31/17 1147  GLUCAP 77 88 95 85 72   D-Dimer No results for input(s): DDIMER in the last 72 hours. Hgb A1c No results for input(s): HGBA1C in the last 72 hours. Lipid Profile No results for input(s): CHOL, HDL, LDLCALC, TRIG, CHOLHDL, LDLDIRECT in the last  72 hours. Thyroid function studies No results for input(s): TSH, T4TOTAL, T3FREE, THYROIDAB in the last 72 hours.  Invalid input(s): FREET3 Anemia work up No results for input(s): VITAMINB12, FOLATE, FERRITIN, TIBC, IRON, RETICCTPCT in the last 72 hours. Urinalysis    Component Value Date/Time   COLORURINE AMBER (A) 07/24/2017 2143   APPEARANCEUR CLOUDY (A) 07/24/2017 2143   LABSPEC 1.015 07/24/2017 2143   PHURINE 6.0 07/24/2017 2143   GLUCOSEU NEGATIVE 07/24/2017 2143   HGBUR MODERATE (A) 07/24/2017 2143   BILIRUBINUR NEGATIVE 07/24/2017 2143   [place] NEGATIVE 07/24/2017 2143   PROTEINUR 30 (A) 07/24/2017 2143   NITRITE NEGATIVE 07/24/2017 2143   LEUKOCYTESUR LARGE (A) 07/24/2017 2143   Sepsis Labs Invalid input(s): PROCALCITONIN,  WBC,  LACTICIDVEN Microbiology Recent Results (from the past 240 hour(s))  Culture, blood (routine x 2)     Status: None   Collection Time: 07/24/17  2:56 PM  Result Value Ref Range Status   Specimen Description BLOOD RIGHT NECK  Final   Special Requests   Final    BOTTLES DRAWN AEROBIC AND ANAEROBIC Blood Culture adequate volume   Culture   Final    NO GROWTH 5 DAYS Performed at [facility], [place] [address] [place], [place] [address]    Report Status 07/29/2017 FINAL  Final  Culture, blood (routine x 2)     Status: None   Collection Time: 07/24/17  3:01 PM  Result Value Ref Range Status   Specimen Description BLOOD LEFT FOREARM  Final   Special Requests   Final    BOTTLES DRAWN AEROBIC AND ANAEROBIC Blood Culture adequate volume   Culture   Final    NO GROWTH 5 DAYS Performed at [facility], [place] [address], [place], [place] [address]    Report Status 07/29/2017 FINAL  Final  Culture, Urine     Status: Abnormal   Collection Time: 07/25/17 10:40 AM  Result Value Ref Range Status   Specimen Description URINE, CATHETERIZED  Final   Special Requests   Final    NONE Performed at [facility], [place] [address]  [place], [place] [address]    Culture MULTIPLE SPECIES PRESENT, SUGGEST RECOLLECTION (A)  Final   Report Status 07/26/2017 FINAL  Final     Patient was seen and examined on the day of discharge and was found to be in stable condition. Time coordinating discharge: 40 minutes including assessment and coordination of care, as well as examination of the patient.   SIGNED:  Dessa Phi, DO [facility] Pager [phone]  If 7PM-7AM, please contact night-coverage [email or web address] Password [email or web address] 08/01/2017, 10:18 AM

## 2017-07-31 NOTE — Progress Notes (Signed)
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  Recently hospitalized from3/28 to 4/19 with hyponatremia and possibly pneumonia. Hospital course complicated by V. fib arrest with STEMIon 4/1, respiratory failure, and AKI. She was not a candidate for invasive procedures and treated medically. 10 day course of zosyn then rocephin completed for pneumonia. Additionally patient had ongoing nausea and vomiting without signs of obstruction or ilues on abdominal xray. She was found to have vocal cord weakness and dysphagia following extubation. She was placed on a dysphagia 3 diet per speech recommendations. Additionally evaluated by Oncology with recommendations to start Lorlatinib for 10 days.   Since discharge, patient and husband report very poor appetite with little PO intake, nausea, some constipation followed 3 large bowel movements, non-specific, and abdominal discomfort. Cdiff negative 4/17. Denies fever but had ongoing cough and soft voice since extubation. Denies chest pain or discomfort, or shortness of breath.   States was getting her bath on day of admission and not wearing her oxygen for about 20 minutes when she initially became altered. On EMS arrival, she was unresponsive and respiratory support given until patient pushed them away with her hands. In the ER, she was sleepy but alert and oriented. Found to be hypotensive with BP 55/29, afebrile, HR 82, respirations unlabored. She was given 3L NS  and then placed on low dose levophed peripherally. Labs noted for WBC 23.4, sCr 2.6 (discharge sCr 0.85), troponin 0.21, EKG without acute changes, lactic acid 2.07, CXR noted for resolving edema/ pneumonia. PCCM consulted for admission.   She was admitted 4/22 for acute encephalopathy and hypotension. She transiently required pressors but responded to fluids. She was noted to have AKI likely due to intravascular volume depletion. BP remained stable and she was transferred to hospitalist service. She was treated with 7 day IV unasyn for aspiration pneumonitis as well as right hand cellulitis, with improvement.   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 She uses Surepress for compression wrapswhich arealso not available. - Wound type:Multiple full thickness wounds Measurement:Affectedpatchyarea to anteriorand posterior legs are 9X4X.2cm, 3X2X.2cm and 15X6X.2cm,seperated by narrow skin bridges. - Bedside nurse to change dressings Q day. Mepitel contact layersto protect skin graft sitesfrom adherence of dressing and decrease discomfort with dressing changes. Substitute Aquacel for Drawtex to provide antimicrobial benefits and absorb drainage. ABD pads and kerlex, then ace wrap for light compression, since Surepress is not available.   Right upper extremity erythema and swelling probable cellulitis  - Doppler study 4/25 was negative for deep veins thrombosis. Positive for Cephalic and Basilic veins on forearm  segments and AC fossa area. Severe edema noted.  - Treated with unasyn with improvement in appearacne   Aspiration pneumonitis - On dysphagia 3 diet - Completed 7 day course unasyn   H/O V fib arrest 07/03/17 - No further ischemic w/u given pt history of metastatic ca per cardiology  - Continue amiodarone - Appreciate cardiology consultation   AKI - Due to hypotension - Now resolved and stable   Lung adenocarcinomawith brain and liver mets - Per oncology - Outpt follow up  - Plan to start ALK inh on outpt basis  Anemia of chronic disease - Due to malignancy - Hgb stable   Moderate protein calorie malnutrition - In the context of chronic illness - Continue nutritional supplementation    Code Status: Full Family Communication: No family at bedside Disposition Plan: SNF when insurance auth complete, likely 4/30   Consultants:   Cardiology  PCCM  Antimicrobials:  Anti-infectives (From admission, onward)   Start     Dose/Rate Route Frequency Ordered Stop   07/27/17 1100  Ampicillin-Sulbactam (UNASYN) 3 g in sodium chloride 0.9 % 100 mL IVPB  Status:  Discontinued     3 g 200 mL/hr over 30 Minutes Intravenous Every 6 hours 07/27/17 0918 07/31/17 0955   07/27/17 1000  ceFAZolin (ANCEF) IVPB 2g/100 mL premix  Status:  Discontinued     2 g 200 mL/hr over 30 Minutes Intravenous Every 8 hours 07/27/17 0906 07/27/17 0917   07/25/17 1500  piperacillin-tazobactam (ZOSYN) IVPB 3.375 g  Status:  Discontinued     3.375 g 12.5 mL/hr over 240 Minutes Intravenous Every 8 hours 07/25/17 0857 07/27/17 0917   07/24/17 2200  piperacillin-tazobactam (ZOSYN) IVPB 2.25 g  Status:  Discontinued     2.25 g 100 mL/hr over 30 Minutes Intravenous Every 8 hours 07/24/17 2027 07/25/17 0857   07/24/17 1600  vancomycin (VANCOCIN) 1,500 mg in sodium chloride 0.9 % 500 mL IVPB     1,500 mg 250 mL/hr over 120 Minutes Intravenous  Once 07/24/17 1526 07/24/17 1756   07/24/17 1530   piperacillin-tazobactam (ZOSYN) IVPB 3.375 g     3.375 g 100 mL/hr over 30 Minutes Intravenous  Once 07/24/17 1526 07/24/17 1721        Subjective: No new complaints. Doing well.   Objective: Vitals:   07/31/17 0350 07/31/17 0954 07/31/17 1239 07/31/17 1302  BP: 130/61 (!) 137/42 138/67   Pulse: 91 85 88   Resp: 18 19 18    Temp: 98.6 F (37 C)  98.1 F (36.7 C)   TempSrc: Oral  Oral   SpO2: 97% 96% 93% 94%  Weight: 110.1 kg (242 lb 11.6 oz)     Height:        Intake/Output Summary (Last 24 hours) at 07/31/2017 1631 Last data filed at 07/31/2017 1536 Gross per 24 hour  Intake 450 ml  Output 1826 ml  Net -1376 ml   Filed Weights   07/29/17 0416 07/30/17 0449 07/31/17 0350  Weight: 109.4 kg (241 lb 2.9 oz) 110.6 kg (243 lb 13.3 oz) 110.1 kg (242 lb 11.6 oz)    Examination:  General exam: Appears calm and comfortable  Respiratory system: Clear to auscultation. Respiratory effort normal. Cardiovascular system: S1 & S2 heard, RRR. No JVD, murmurs, rubs, gallops or clicks. No pedal edema. Gastrointestinal system: Abdomen is nondistended, soft and nontender. No organomegaly or masses felt. Normal bowel sounds heard. Central nervous system: Alert and oriented. No focal neurological deficits. Extremities: Symmetric 5 x 5 power.  Skin: +Right hand cellulitis with minimal erythema, no increase in warmth or drainage noted  Psychiatry: Judgement and insight appear normal. Mood & affect appropriate.   Data Reviewed: I have personally reviewed following labs and imaging studies  CBC: Recent Labs  Lab 07/27/17 0344 07/28/17 0452 07/29/17 0625 07/30/17 0502 07/31/17 0541  WBC 10.1 8.2 8.3 8.5 7.4  HGB 8.0* 7.7* 7.6* 7.5* 7.2*  HCT 25.3* 24.7* 24.5* 24.7* 23.9*  MCV 92.0 89.2 88.8 88.8 88.5  PLT 180 175 163 187 PLATELET CLUMPS NOTED ON SMEAR, UNABLE TO ESTIMATE   Basic Metabolic Panel: Recent Labs  Lab 07/24/17 2134  07/25/17 0247 07/27/17 0344 07/28/17 0452  07/28/17 1305 07/29/17 0625 07/30/17 0502 07/31/17 0541  NA  --    < > 139 137 137  --  133* 133* 133*  K  --    < > 4.2 3.1* 3.0*  --  3.3* 4.3 4.3  CL  --    < > 111 109 104  --  100* 102 104  CO2  --    < > 20* 20* 23  --  25 25 25   GLUCOSE  --    < > 103* 98 98  --  104* 93 86  BUN  --    < > 32* 15 12  --  6 8 <5*  CREATININE  --    < > 1.90* 0.88 0.76  --  0.62 0.60 0.68  CALCIUM  --    < > 7.4* 7.9* 8.0*  --  7.8* 7.9* 8.0*  MG 1.8  --  1.8  --   --  1.5*  --  1.9  --   PHOS 4.6  --  4.8*  --   --   --   --   --   --    < > = values in this interval not displayed.   GFR: Estimated Creatinine Clearance: 85.1 mL/min (by C-G formula based on SCr of 0.68 mg/dL). Liver Function Tests: Recent Labs  Lab 07/25/17 0247  AST 20  ALT 13*  ALKPHOS 82  BILITOT 0.4  PROT 5.5*  ALBUMIN 2.1*   No results for input(s): LIPASE, AMYLASE in the last 168 hours. No results for input(s): AMMONIA in the last 168 hours. Coagulation Profile: No results for input(s): INR, PROTIME in the last 168 hours. Cardiac Enzymes: Recent Labs  Lab 07/25/17 0919  TROPONINI 0.17*   BNP (last 3 results) No results for input(s): PROBNP in the last 8760 hours. HbA1C: No results for input(s): HGBA1C in the last 72 hours. CBG: Recent Labs  Lab 07/30/17 2054 07/30/17 2344 07/31/17 0339 07/31/17 0731 07/31/17 1147  GLUCAP 77 88 95 85 72   Lipid Profile: No results for input(s): CHOL, HDL, LDLCALC, TRIG, CHOLHDL, LDLDIRECT in the last 72 hours. Thyroid Function Tests: No results for input(s): TSH, T4TOTAL, FREET4, T3FREE, THYROIDAB in the last 72 hours. Anemia Panel: No results for input(s): VITAMINB12, FOLATE, FERRITIN, TIBC, IRON, RETICCTPCT in the last 72 hours. Sepsis Labs: Recent Labs  Lab 07/24/17 2002 07/24/17 2134  PROCALCITON  --  0.29  LATICACIDVEN 0.67 1.4    Recent Results (from the past 240 hour(s))  Culture, blood (routine x 2)     Status: None   Collection Time: 07/24/17   2:56 PM  Result Value Ref Range Status   Specimen Description BLOOD RIGHT NECK  Final   Special Requests   Final    BOTTLES DRAWN AEROBIC AND ANAEROBIC Blood Culture  adequate volume   Culture   Final    NO GROWTH 5 DAYS Performed at [facility], [place] [address] [place], [place] [address]    Report Status 07/29/2017 FINAL  Final  Culture, blood (routine x 2)     Status: None   Collection Time: 07/24/17  3:01 PM  Result Value Ref Range Status   Specimen Description BLOOD LEFT FOREARM  Final   Special Requests   Final    BOTTLES DRAWN AEROBIC AND ANAEROBIC Blood Culture adequate volume   Culture   Final    NO GROWTH 5 DAYS Performed at [facility], [place] [address] [place], [place] [address]    Report Status 07/29/2017 FINAL  Final  Culture, Urine     Status: Abnormal   Collection Time: 07/25/17 10:40 AM  Result Value Ref Range Status   Specimen Description URINE, CATHETERIZED  Final   Special Requests   Final    NONE Performed at [facility], [place] [address], [place], [place] [address]    Culture MULTIPLE SPECIES PRESENT, SUGGEST RECOLLECTION (A)  Final   Report Status 07/26/2017 FINAL  Final       Radiology Studies: No results found.    Scheduled Meds: . amiodarone  200 mg Oral Daily  . aspirin  81 mg Oral Daily  . atorvastatin  40 mg Oral q1800  . feeding supplement (ENSURE ENLIVE)  237 mL Oral Q1500  . feeding supplement (PRO-STAT SUGAR FREE 64)  30 mL Oral Daily  . heparin  5,000 Units Subcutaneous Q8H  . Lorlatinib  100 mg Oral Daily  . potassium chloride  20 mEq Oral BID  . sertraline  100 mg Oral Daily   Continuous Infusions: . sodium chloride 250 mL (07/29/17 1742)     LOS: 7 days    Time spent: 35 minutes   Dessa Phi, DO [facility] [email or web address] Password [facility] 07/31/2017, 4:31 PM

## 2017-07-31 NOTE — Progress Notes (Signed)
Cleansed patient's left leg cellulitis with saline moistened gauze. Mepitel still on leg. Applied aquacel, kerlex and ace wrap. Patient tolerated well.

## 2017-08-01 LAB — GLUCOSE, CAPILLARY: Glucose-Capillary: 86 mg/dL (ref 65–99)

## 2017-08-01 MED ORDER — ENSURE ENLIVE PO LIQD
237.0000 mL | Freq: Two times a day (BID) | ORAL | Status: DC
  Administered 2017-08-03: 237 mL via ORAL

## 2017-08-01 MED ORDER — ADULT MULTIVITAMIN W/MINERALS CH
1.0000 | ORAL_TABLET | Freq: Every day | ORAL | Status: DC
  Administered 2017-08-01 – 2017-08-03 (×3): 1 via ORAL
  Filled 2017-08-01 (×3): qty 1

## 2017-08-01 NOTE — Progress Notes (Signed)
  Speech Language Pathology Treatment: Dysphagia  Patient Details Name: Jennifer Delgado MRN: [id number] DOB: Nov 26, 1950 Today's Date: 08/01/2017 Time: 2585-2778 SLP Time Calculation (min) (ACUTE ONLY): 12 min  Assessment / Plan / Recommendation Clinical Impression  Pt was seen for dysphagia treatment with SLP providing trials of soft solids with thin liquids to assess for readiness to upgrade. SLP provided Min cues for effortful swallows and use of dry swallows to facilitate clearance of residue with solids, which was what had reduced pt's ability to safely swallow thin liquids on FEES. When given very small pieces of solids (about dime-sized) pt used a liquid wash without overt signs of aspiration given Min cues for adequate timing of chin tuck. However with mildly bigger pieces (about quarter-sized), pt had immediate coughing suggestive of airway compromise. Recommend to continue with current diet and precautions (Dys 3 textures, nectar thick liquids, thin water in between meals, chin tuck).   HPI HPI: 15 yoF presenting from SNF with nausea, decreased PO intake, abd discomfort since recent discharge (4/19) with AMS after being off oxygen. Pt admitted with hypovolemic shock with HTN requiring pressors, acute on chronic respiratory failure. Pt was seen during recent admission with evidence of post-extubation dysphagia, weak cough and dysphonic voice. Pt was most recently recommended to have Dys 3 diet and nectar thick liquids with use of chin tuck; thin liquids via provale cup only (MBS 4/18).       SLP Plan  Continue with current plan of care       Recommendations  Diet recommendations: Dysphagia 3 (mechanical soft);Nectar-thick liquid;Other(comment)(water okay in between meals) Liquids provided via: Straw Medication Administration: Crushed with puree Supervision: Patient able to self feed;Intermittent supervision to cue for compensatory strategies Compensations: Slow rate;Small sips/bites;Chin  tuck Postural Changes and/or Swallow Maneuvers: Seated upright 90 degrees;Chin tuck                Oral Care Recommendations: Oral care BID;Oral care prior to ice chip/H20 Follow up Recommendations: Skilled Nursing facility SLP Visit Diagnosis: Dysphagia, oropharyngeal phase (R13.12) Plan: Continue with current plan of care       GO                Germain Osgood 08/01/2017, 4:55 PM  Germain Osgood, M.A. CCC-SLP [phone]

## 2017-08-01 NOTE — Progress Notes (Signed)
Nutrition Follow-up  DOCUMENTATION CODES:   Obesity unspecified  INTERVENTION:   -D/c Prostat due to poor acceptance -MVI daily -Increase Ensure Enlive po to BID, each supplement provides 350 kcal and 20 grams of protein  NUTRITION DIAGNOSIS:   Increased nutrient needs related to chronic illness as evidenced by estimated needs.  Ongoing  GOAL:   Patient will meet greater than or equal to 90% of their needs  Progressing  MONITOR:   PO intake, Supplement acceptance, Diet advancement, Weight trends, Labs, Skin, I & O's  REASON FOR ASSESSMENT:   Low [place]    ASSESSMENT:   67 year old female with past medical history of non-small cell stage IV carcinoma (diagnosed October 2016, adenocarcinoma with positive ALK gene translocation s/p treatment w/ Viviann Spare and Alectnib) with progression of metastasis to the brain, hypertension, obesity, chronic hypoxic resp failure on baseline 2L home oxygen presenting from SNF with altered mental status and hypotension.    4/23- s/p FEES, continue dysphagia 3 diet with nectar thick liquids  Per SLP, pt to remain on dysphagia 3 diet with nectar thick liquids. Pt is permitted thin liquids with Provale cup.   Pt resting in bed at time of visit. Noted pt consumed about 50% of her lunch tray. Intake is variable; noted 35-100%. Pt is refusing Prostat supplements, but taking Ensure Enlive per MAR.  Medically stable to d/c to [facility]; awaiting insurance authorization per CSW.  Labs reviewed: CBGS WDL. Na: 133  Diet Order:   Diet Order           DIET DYS 3 Room service appropriate? Yes; Fluid consistency: Nectar Thick  Diet effective now          EDUCATION NEEDS:   Not appropriate for education at this time  Skin:  Skin Assessment: Skin Integrity Issues: Skin Integrity Issues:: Other (Comment), DTI DTI: buttocks Other: non pressure injury to L leg  Last BM:  07/31/17  Height:   Ht Readings from Last 1 Encounters:   07/25/17 5' 4"  (1.626 m)    Weight:   Wt Readings from Last 1 Encounters:  08/01/17 243 lb 6.2 oz (110.4 kg)    Ideal Body Weight:  54.5 kg  BMI:  Body mass index is 41.78 kg/m.  Estimated Nutritional Needs:   Kcal:  1900-2200  Protein:  105-125 grams  Fluid:  >/= 2 L/day    Karie Skowron A. Jimmye Norman, RD, LDN, CDE Pager: [phone] After hours Pager: [phone]

## 2017-08-01 NOTE — Consult Note (Addendum)
[facility] Nurse wound consult note Reason for Consult: Consulted for assessment of buttocks and sacral areas.  Patient well known to our team from previous consultations and a recent admission. Patient with recent episodes of loose stools (negative for C-DIff) and there is an external urinary incontinence collection device (PurWick) in place. There is a small amount of urinary and fecal incontinence this morning despite PurWick and despite frequent placement on the bedpan. Wound type: Moisture, friction Pressure Injury POA: NA Measurement: Right buttock with partial thickness tissue loss secondary to bedpan use, moisture and friction in an area measuring 2.6cm x 3cm x 0.1cm Wound GOV:PCHE, "glistening" tissue, scant serous drainage Drainage (amount, consistency, odor) As noted above Periwound: intact, erythematous.  Medial thigh presentation could be a fungal overgrowth. Recommend treatment with oral antifungal for a few days. If you agree, please order. Dressing procedure/placement/frequency: In light of the recent onset of loose stools and the minimal seepage from the PurWick urinary containment device, I will add a mattress replacement with low air loss feature this morning.  Additionally, I have provided Nursing with guidance for care of the buttock area using our house skin cleanser and following each episode of incontinence with application of our moisture barrier cream (Critic Aide Clear).  Patient to continue turning from side to side and HOB is to be at or below a 30 degree angle.  [facility] nursing team will not follow, but will remain available to this patient, the nursing and medical teams.  Please re-consult if needed. Thanks, Maudie Flakes, MSN, RN, [place], Arther Abbott  Pager# [phone]

## 2017-08-01 NOTE — Plan of Care (Signed)
  Problem: Education: Goal: Knowledge of General Education information will improve Outcome: Progressing   Problem: Health Behavior/Discharge Planning: Goal: Ability to manage health-related needs will improve Outcome: Progressing   Problem: Clinical Measurements: Goal: Ability to maintain clinical measurements within normal limits will improve Outcome: Progressing Goal: Will remain free from infection Outcome: Progressing Goal: Diagnostic test results will improve Outcome: Progressing Goal: Respiratory complications will improve Outcome: Progressing Goal: Cardiovascular complication will be avoided Outcome: Progressing   Problem: Clinical Measurements: Goal: Will remain free from infection Outcome: Progressing   Problem: Clinical Measurements: Goal: Diagnostic test results will improve Outcome: Progressing   Problem: Clinical Measurements: Goal: Respiratory complications will improve Outcome: Progressing   Problem: Nutrition: Goal: Adequate nutrition will be maintained Outcome: Progressing   Problem: Coping: Goal: Level of anxiety will decrease Outcome: Progressing   Problem: Skin Integrity: Goal: Risk for impaired skin integrity will decrease Outcome: Progressing providing daily would care.

## 2017-08-01 NOTE — Progress Notes (Signed)
Physical Therapy Treatment Patient Details Name: Jennifer Delgado MRN: [id number] DOB: [birth year] Today's Date: 08/01/2017    History of Present Illness 67 yo admitted 4/22 for acute encephalopathy after removing her oxygen, found to be hypotensive transiently requiring pressors, with acute renal failure. PMHx: lung CA with mets to brin and liver, STEMI, vocal cord dysfunction    PT Comments    Pt pleasant, alert, disoriented to day but following commands with decreased time. Pt with improved balance, transfers and able to stand with 2 person assist from bed today. Pt encouraged to continue to assist nursing with bed mobility and will continue to follow to progress mobility. Pt fatigued after standing trials and unable to then transfer with lateral scoot to chair.   Pt on 2L throughout with no AD HR 76    Follow Up Recommendations  SNF;Supervision/Assistance - 24 hour     Equipment Recommendations  None recommended by PT    Recommendations for Other Services       Precautions / Restrictions Precautions Precautions: Fall Restrictions Weight Bearing Restrictions: No    Mobility  Bed Mobility Overal bed mobility: Needs Assistance Bed Mobility: Supine to Sit;Sit to Supine     Supine to sit: Mod assist;+2 for physical assistance;HOB elevated Sit to supine: Max assist;+2 for physical assistance   General bed mobility comments: mod +2 assist to pivot to right side of bed with rail, pad, cues to sequence. Assist to fully move legs off of bed and elevate trunk with HOb 30degrees. Return to bed max +2 assist for pivot back to bed and slide toward [facility] total assist +2  Transfers Overall transfer level: Needs assistance   Transfers: Sit to/from Stand Sit to Stand: Mod assist;+2 physical assistance         General transfer comment: pt able to scoot to EOB then stand from EOB with bil knees blocked and assist of pad and belt to stand x 4 trials grossly 5-10 sec. Pt with very wide BOS  shoulder width and unable to narrow BOS or shift weight  Ambulation/Gait             General Gait Details: unable at this time   Stairs             Wheelchair Mobility    Modified Rankin (Stroke Patients Only)       Balance Overall balance assessment: Needs assistance   Sitting balance-Leahy Scale: Fair Sitting balance - Comments: pt able to sit EOB with minguard assist and tolerate sitting even with weight shifting     Standing balance-Leahy Scale: Zero Standing balance comment: bil UE support in standing                            Cognition Arousal/Alertness: Awake/alert Behavior During Therapy: Flat affect                           Following Commands: Follows one step commands consistently     Problem Solving: Requires verbal cues;Requires tactile cues General Comments: pt with improved cognition and balance from last session      Exercises      General Comments        Pertinent Vitals/Pain Pain Assessment: No/denies pain    Home Living                      Prior Function  PT Goals (current goals can now be found in the care plan section) Progress towards PT goals: Progressing toward goals    Frequency           PT Plan Current plan remains appropriate    Co-evaluation              AM-PAC PT "6 Clicks" Daily Activity  Outcome Measure  Difficulty turning over in bed (including adjusting bedclothes, sheets and blankets)?: Unable Difficulty moving from lying on back to sitting on the side of the bed? : Unable Difficulty sitting down on and standing up from a chair with arms (e.g., wheelchair, bedside commode, etc,.)?: Unable Help needed moving to and from a bed to chair (including a wheelchair)?: Total Help needed walking in hospital room?: Total Help needed climbing 3-5 steps with a railing? : Total 6 Click Score: 6    End of Session Equipment Utilized During Treatment: Gait  belt Activity Tolerance: Patient tolerated treatment well Patient left: in bed;with call bell/phone within reach;with bed alarm set Nurse Communication: Mobility status;Need for lift equipment PT Visit Diagnosis: Difficulty in walking, not elsewhere classified (R26.2);Other symptoms and signs involving the nervous system (R29.898);Muscle weakness (generalized) (M62.81);Other abnormalities of gait and mobility (R26.89)     Time: 1021-1173 PT Time Calculation (min) (ACUTE ONLY): 29 min  Charges:  $Therapeutic Activity: 23-37 mins                    G Codes:       Elwyn Reach, PT [phone]    Quantez Schnyder B Mehki Klumpp 08/01/2017, 12:25 PM

## 2017-08-01 NOTE — Progress Notes (Signed)
Changed pts dressing on left leg. Cleansed with saline moist gauze. Placed Mepitel, Aquacel, ABD pads, kurlex and ace wrap. Pt tolerated well.

## 2017-08-01 NOTE — Clinical Social Work Note (Addendum)
Insurance authorization still pending.  Dayton Scrape, [place] [phone]  1:23 pm Insurance authorization still pending. SNF found out on Friday that patient has [place] [employer] [place]. Patient aware that we are still waiting on authorization.  Dayton Scrape, CSW [phone]  4:32 pm Authorization still pending.   Dayton Scrape, [place]

## 2017-08-01 NOTE — Progress Notes (Signed)
Patient resting inn bed with no complaints, vitals stable, afebrile, plan to go to [facility] health rehab per patient husband at bedside shift report.

## 2017-08-01 NOTE — Progress Notes (Signed)
RN aware that transfer to SNF is still pending. No new orders for patient.

## 2017-08-02 NOTE — Clinical Social Work Note (Addendum)
Insurance authorization still pending. [place] is sending admissions coordinator a form to fill out to try and expedite authorization since patient has been ready for discharge. They will consider a 5-day LOG if we do not have authorization by later this afternoon.  Dayton Scrape, [place] [phone]  1:25 pm CSW waiting on status update from SNF admissions coordinator. Left message.  Dayton Scrape, CSW [phone]  1:32 pm Admissions coordinator has not gotten any updates from [facility] and they have not sent the form to expedite the process. She faxed over updated clinicals and left a voicemail for patient's husband to see if he could call the insurance company in hopes that that will expedite the process as well. She will ask about a 5-day LOG in case we still do not have auth by 4:00.  Dayton Scrape, [place] [phone]  4:35 pm SNF will not take LOG due to issues with reaching insurance provider and concern that they may not approve. Only if [employer] denies authorization are we able to utilize her secondary insurance, Medicare. CSW has been on hold attempting to reach someone at [facility] regarding status of authorization.  Dayton Scrape, [place]

## 2017-08-02 NOTE — Care Management Important Message (Signed)
Important Message  Patient Details  Name: Jennifer Delgado MRN: [id number] Date of Birth: 1951-04-01   Medicare Important Message Given:  Yes    Carles Collet, RN 08/02/2017, 10:11 AM

## 2017-08-02 NOTE — Progress Notes (Signed)
   No acute events overnight. Pending placement.   Vitals:   08/01/17 2000 08/02/17 0543  BP: 132/71 (!) 148/64  Pulse: 88 89  Resp:  18  Temp:  (!) 97.3 F (36.3 C)  SpO2:  96%    Discharge summary and Med Rec Done by Dr Maylene Roes on 08/01/17. Please call with further questions if needed. Otherwise no change in plans.    Ankit Arsenio Loader, MD [facility] Pager [phone]   If 7PM-7AM, please contact night-coverage [email or web address] Password [email or web address] 08/02/2017, 12:15 PM

## 2017-08-03 DIAGNOSIS — L8990 Pressure ulcer of unspecified site, unspecified stage: Secondary | ICD-10-CM

## 2017-08-03 NOTE — Progress Notes (Signed)
SLP Cancellation Note  Patient Details Name: Jennifer Delgado MRN: [id number] DOB: March 08, 1951   Cancelled treatment:       Reason Eval/Treat Not Completed: Medical issues which prohibited therapy. Pt declined dysphagia tx this morning due to nausea. Will f/u as able.   Germain Osgood 08/03/2017, 10:04 AM  Germain Osgood, M.A. CCC-SLP [phone]

## 2017-08-03 NOTE — Clinical Social Work Note (Addendum)
Authorization still pending. CSW spoke with [place] at [place] [place]. She stated they never received the form that they needed yesterday. Per admissions coordinator, it was never faxed to her. Katrina showed CSW how to access form on their website and CSW faxed it to admissions coordinator. [employer] [place] is expecting the form and will work on the authorization for today once received.  Jennifer Delgado, [place] [phone]  12:03 pm [employer] [place] just called admissions coordinator to ask questions and said she will give paperwork to RN to review and the RN will call back with authorization.  Jennifer Delgado, [place] [phone]  1:38 pm Insurance authorization approved for discharge to [facility] today.  Jennifer Delgado, [place]

## 2017-08-03 NOTE — Clinical Social Work Placement (Signed)
   CLINICAL SOCIAL WORK PLACEMENT  NOTE  Date:  08/03/2017  Patient Details  Name: Jennifer Delgado MRN: [id number] Date of Birth: Mar 03, 1951  Clinical Social Work is seeking post-discharge placement for this patient at the [place] level of care (*CSW will initial, date and re-position this form in  chart as items are completed):  Yes   Patient/family provided with [place] Work Department's list of facilities offering this level of care within the geographic area requested by the patient (or if unable, by the patient's family).  Yes   Patient/family informed of their freedom to choose among providers that offer the needed level of care, that participate in Medicare, Medicaid or managed care program needed by the patient, have an available bed and are willing to accept the patient.  Yes   Patient/family informed of [place]'s ownership interest in [facility] and [facility], as well as of the fact that they are under no obligation to receive care at these facilities.  PASRR submitted to EDS on 07/28/17     PASRR number received on       Existing PASRR number confirmed on 07/28/17     FL2 transmitted to all facilities in geographic area requested by pt/family on 07/28/17     FL2 transmitted to all facilities within larger geographic area on       Patient informed that his/her managed care company has contracts with or will negotiate with certain facilities, including the following:        Yes   Patient/family informed of bed offers received.  Patient chooses bed at [facility]     Physician recommends and patient chooses bed at      Patient to be transferred to [facility] on 08/03/17.  Patient to be transferred to facility by [facility]     Patient family notified on 08/03/17 of transfer.  Name of family member notified:  Attempted calling husband, Olubunmi Rothenberger.     PHYSICIAN       Additional Comment:     _______________________________________________ Candie Chroman, LCSW 08/03/2017, 2:16 PM

## 2017-08-03 NOTE — Progress Notes (Signed)
Patient ready for transport to SNF. Tele removed, IV removed. All personal belongings packed and with patient. Pt has no complaints or any noted distress at this time. Report called to receiving facility.

## 2017-08-03 NOTE — Progress Notes (Signed)
Report called to [facility]. All personal belongings with patient.

## 2017-08-03 NOTE — Clinical Social Work Note (Signed)
CSW facilitated patient discharge including contacting facility to confirm patient discharge plans. CSW attempted calling patient's husband on phone number in chart and phone number patient provided. Both phones are off. Patient will notify her husband when he calls. Clinical information faxed to facility and family agreeable with plan. CSW arranged ambulance transport via [facility] to [facility] at 4:15 pm. RN has already called report.  CSW will sign off for now as social work intervention is no longer needed. Please consult [place] again if new needs arise.  Dayton Scrape, [place]

## 2017-08-03 NOTE — Progress Notes (Signed)
   NO acute events overnight. Patient seen at bedside, answered all of her questions.   Vitals:   08/02/17 2016 08/03/17 0623  BP: (!) 144/69 134/66  Pulse: 79 83  Resp: 18 17  Temp: 98.5 F (36.9 C) 97.8 F (36.6 C)  SpO2: 99% 99%   Med Rec and discharge summary done by Dr Maylene Roes on 4/30. No other changes. Please call as needed.   Ankit Arsenio Loader, MD [facility] Pager [phone]   If 7PM-7AM, please contact night-coverage [email or web address] Password [email or web address] 08/03/2017, 10:13 AM

## 2017-08-04 ENCOUNTER — Encounter: Payer: Self-pay | Admitting: Adult Health

## 2017-08-04 DIAGNOSIS — G893 Neoplasm related pain (acute) (chronic): Secondary | ICD-10-CM | POA: Insufficient documentation

## 2017-08-04 DIAGNOSIS — E785 Hyperlipidemia, unspecified: Secondary | ICD-10-CM | POA: Insufficient documentation

## 2017-08-04 DIAGNOSIS — R600 Localized edema: Secondary | ICD-10-CM | POA: Insufficient documentation

## 2017-08-04 DIAGNOSIS — I4901 Ventricular fibrillation: Secondary | ICD-10-CM | POA: Insufficient documentation

## 2017-08-23 ENCOUNTER — Inpatient Hospital Stay (HOSPITAL_COMMUNITY)
Admission: EM | Admit: 2017-08-23 | Discharge: 2017-09-02 | DRG: 064 | Disposition: E | Payer: BLUE CROSS/BLUE SHIELD | Attending: Internal Medicine | Admitting: Internal Medicine

## 2017-08-23 ENCOUNTER — Inpatient Hospital Stay: Payer: Self-pay

## 2017-08-23 ENCOUNTER — Encounter (HOSPITAL_COMMUNITY): Payer: Self-pay

## 2017-08-23 ENCOUNTER — Emergency Department (HOSPITAL_COMMUNITY): Payer: BLUE CROSS/BLUE SHIELD

## 2017-08-23 DIAGNOSIS — Z7982 Long term (current) use of aspirin: Secondary | ICD-10-CM

## 2017-08-23 DIAGNOSIS — E870 Hyperosmolality and hypernatremia: Secondary | ICD-10-CM | POA: Diagnosis not present

## 2017-08-23 DIAGNOSIS — N179 Acute kidney failure, unspecified: Secondary | ICD-10-CM | POA: Diagnosis present

## 2017-08-23 DIAGNOSIS — C787 Secondary malignant neoplasm of liver and intrahepatic bile duct: Secondary | ICD-10-CM | POA: Diagnosis present

## 2017-08-23 DIAGNOSIS — E876 Hypokalemia: Secondary | ICD-10-CM | POA: Diagnosis not present

## 2017-08-23 DIAGNOSIS — Z515 Encounter for palliative care: Secondary | ICD-10-CM | POA: Diagnosis not present

## 2017-08-23 DIAGNOSIS — E860 Dehydration: Secondary | ICD-10-CM | POA: Diagnosis present

## 2017-08-23 DIAGNOSIS — R4182 Altered mental status, unspecified: Secondary | ICD-10-CM | POA: Diagnosis present

## 2017-08-23 DIAGNOSIS — J9621 Acute and chronic respiratory failure with hypoxia: Secondary | ICD-10-CM | POA: Diagnosis not present

## 2017-08-23 DIAGNOSIS — E785 Hyperlipidemia, unspecified: Secondary | ICD-10-CM | POA: Diagnosis present

## 2017-08-23 DIAGNOSIS — C7931 Secondary malignant neoplasm of brain: Secondary | ICD-10-CM | POA: Diagnosis present

## 2017-08-23 DIAGNOSIS — C3490 Malignant neoplasm of unspecified part of unspecified bronchus or lung: Secondary | ICD-10-CM | POA: Diagnosis present

## 2017-08-23 DIAGNOSIS — I63521 Cerebral infarction due to unspecified occlusion or stenosis of right anterior cerebral artery: Secondary | ICD-10-CM

## 2017-08-23 DIAGNOSIS — R4702 Dysphasia: Secondary | ICD-10-CM | POA: Diagnosis present

## 2017-08-23 DIAGNOSIS — Z888 Allergy status to other drugs, medicaments and biological substances status: Secondary | ICD-10-CM

## 2017-08-23 DIAGNOSIS — Z8674 Personal history of sudden cardiac arrest: Secondary | ICD-10-CM

## 2017-08-23 DIAGNOSIS — R6521 Severe sepsis with septic shock: Secondary | ICD-10-CM | POA: Diagnosis present

## 2017-08-23 DIAGNOSIS — Z66 Do not resuscitate: Secondary | ICD-10-CM | POA: Diagnosis not present

## 2017-08-23 DIAGNOSIS — E875 Hyperkalemia: Secondary | ICD-10-CM | POA: Diagnosis present

## 2017-08-23 DIAGNOSIS — A4151 Sepsis due to Escherichia coli [E. coli]: Secondary | ICD-10-CM | POA: Diagnosis present

## 2017-08-23 DIAGNOSIS — I6340 Cerebral infarction due to embolism of unspecified cerebral artery: Principal | ICD-10-CM | POA: Diagnosis present

## 2017-08-23 DIAGNOSIS — L03317 Cellulitis of buttock: Secondary | ICD-10-CM

## 2017-08-23 DIAGNOSIS — A419 Sepsis, unspecified organism: Secondary | ICD-10-CM | POA: Diagnosis not present

## 2017-08-23 DIAGNOSIS — G92 Toxic encephalopathy: Secondary | ICD-10-CM | POA: Diagnosis present

## 2017-08-23 DIAGNOSIS — J910 Malignant pleural effusion: Secondary | ICD-10-CM | POA: Diagnosis present

## 2017-08-23 DIAGNOSIS — Z781 Physical restraint status: Secondary | ICD-10-CM

## 2017-08-23 DIAGNOSIS — E669 Obesity, unspecified: Secondary | ICD-10-CM | POA: Diagnosis present

## 2017-08-23 DIAGNOSIS — E872 Acidosis: Secondary | ICD-10-CM | POA: Diagnosis present

## 2017-08-23 DIAGNOSIS — I129 Hypertensive chronic kidney disease with stage 1 through stage 4 chronic kidney disease, or unspecified chronic kidney disease: Secondary | ICD-10-CM | POA: Diagnosis present

## 2017-08-23 DIAGNOSIS — Z9981 Dependence on supplemental oxygen: Secondary | ICD-10-CM

## 2017-08-23 DIAGNOSIS — D649 Anemia, unspecified: Secondary | ICD-10-CM

## 2017-08-23 DIAGNOSIS — L89624 Pressure ulcer of left heel, stage 4: Secondary | ICD-10-CM | POA: Diagnosis present

## 2017-08-23 DIAGNOSIS — I82413 Acute embolism and thrombosis of femoral vein, bilateral: Secondary | ICD-10-CM | POA: Diagnosis not present

## 2017-08-23 DIAGNOSIS — R410 Disorientation, unspecified: Secondary | ICD-10-CM

## 2017-08-23 DIAGNOSIS — N10 Acute tubulo-interstitial nephritis: Secondary | ICD-10-CM | POA: Diagnosis present

## 2017-08-23 DIAGNOSIS — J9811 Atelectasis: Secondary | ICD-10-CM | POA: Diagnosis not present

## 2017-08-23 DIAGNOSIS — J383 Other diseases of vocal cords: Secondary | ICD-10-CM | POA: Diagnosis present

## 2017-08-23 DIAGNOSIS — E861 Hypovolemia: Secondary | ICD-10-CM | POA: Diagnosis present

## 2017-08-23 DIAGNOSIS — Z881 Allergy status to other antibiotic agents status: Secondary | ICD-10-CM

## 2017-08-23 DIAGNOSIS — L03116 Cellulitis of left lower limb: Secondary | ICD-10-CM | POA: Diagnosis present

## 2017-08-23 DIAGNOSIS — I272 Pulmonary hypertension, unspecified: Secondary | ICD-10-CM | POA: Diagnosis present

## 2017-08-23 DIAGNOSIS — I252 Old myocardial infarction: Secondary | ICD-10-CM

## 2017-08-23 DIAGNOSIS — J9690 Respiratory failure, unspecified, unspecified whether with hypoxia or hypercapnia: Secondary | ICD-10-CM

## 2017-08-23 DIAGNOSIS — R739 Hyperglycemia, unspecified: Secondary | ICD-10-CM | POA: Diagnosis not present

## 2017-08-23 DIAGNOSIS — I2699 Other pulmonary embolism without acute cor pulmonale: Secondary | ICD-10-CM | POA: Clinically undetermined

## 2017-08-23 DIAGNOSIS — N183 Chronic kidney disease, stage 3 (moderate): Secondary | ICD-10-CM | POA: Diagnosis present

## 2017-08-23 DIAGNOSIS — C7889 Secondary malignant neoplasm of other digestive organs: Secondary | ICD-10-CM | POA: Diagnosis present

## 2017-08-23 DIAGNOSIS — G928 Other toxic encephalopathy: Secondary | ICD-10-CM | POA: Diagnosis present

## 2017-08-23 DIAGNOSIS — Z6838 Body mass index (BMI) 38.0-38.9, adult: Secondary | ICD-10-CM

## 2017-08-23 DIAGNOSIS — R001 Bradycardia, unspecified: Secondary | ICD-10-CM | POA: Diagnosis not present

## 2017-08-23 DIAGNOSIS — R1912 Hyperactive bowel sounds: Secondary | ICD-10-CM | POA: Diagnosis not present

## 2017-08-23 DIAGNOSIS — D638 Anemia in other chronic diseases classified elsewhere: Secondary | ICD-10-CM | POA: Diagnosis present

## 2017-08-23 DIAGNOSIS — Z8673 Personal history of transient ischemic attack (TIA), and cerebral infarction without residual deficits: Secondary | ICD-10-CM

## 2017-08-23 DIAGNOSIS — I2510 Atherosclerotic heart disease of native coronary artery without angina pectoris: Secondary | ICD-10-CM | POA: Diagnosis present

## 2017-08-23 DIAGNOSIS — K219 Gastro-esophageal reflux disease without esophagitis: Secondary | ICD-10-CM | POA: Diagnosis present

## 2017-08-23 DIAGNOSIS — R1310 Dysphagia, unspecified: Secondary | ICD-10-CM | POA: Diagnosis present

## 2017-08-23 HISTORY — DX: Cardiac arrest, cause unspecified: I46.9

## 2017-08-23 LAB — BASIC METABOLIC PANEL
Anion gap: 10 (ref 5–15)
BUN: 86 mg/dL — ABNORMAL HIGH (ref 6–20)
CO2: 15 mmol/L — ABNORMAL LOW (ref 22–32)
CREATININE: 3.01 mg/dL — AB (ref 0.44–1.00)
Calcium: 7.9 mg/dL — ABNORMAL LOW (ref 8.9–10.3)
Chloride: 115 mmol/L — ABNORMAL HIGH (ref 101–111)
GFR calc non Af Amer: 15 mL/min — ABNORMAL LOW (ref >60)
GFR, EST AFRICAN AMERICAN: 18 mL/min — AB (ref >60)
Glucose, Bld: 71 mg/dL (ref 65–99)
Potassium: 4.5 mmol/L (ref 3.5–5.1)
SODIUM: 140 mmol/L (ref 135–145)

## 2017-08-23 LAB — PREPARE RBC (CROSSMATCH)

## 2017-08-23 LAB — COMPREHENSIVE METABOLIC PANEL
ALBUMIN: 2 g/dL — AB (ref 3.5–5.0)
ALT: 9 U/L — ABNORMAL LOW (ref 14–54)
AST: 14 U/L — AB (ref 15–41)
Alkaline Phosphatase: 81 U/L (ref 38–126)
Anion gap: 9 (ref 5–15)
BUN: 103 mg/dL — AB (ref 6–20)
CHLORIDE: 110 mmol/L (ref 101–111)
CO2: 18 mmol/L — ABNORMAL LOW (ref 22–32)
Calcium: 8.3 mg/dL — ABNORMAL LOW (ref 8.9–10.3)
Creatinine, Ser: 4.03 mg/dL — ABNORMAL HIGH (ref 0.44–1.00)
GFR calc Af Amer: 12 mL/min — ABNORMAL LOW (ref >60)
GFR calc non Af Amer: 11 mL/min — ABNORMAL LOW (ref >60)
GLUCOSE: 77 mg/dL (ref 65–99)
Potassium: 5.4 mmol/L — ABNORMAL HIGH (ref 3.5–5.1)
SODIUM: 137 mmol/L (ref 135–145)
Total Bilirubin: 0.4 mg/dL (ref 0.3–1.2)
Total Protein: 6.4 g/dL — ABNORMAL LOW (ref 6.5–8.1)

## 2017-08-23 LAB — CBC WITH DIFFERENTIAL/PLATELET
BAND NEUTROPHILS: 4 %
BASOS PCT: 0 %
Basophils Absolute: 0 10*3/uL (ref 0.0–0.1)
Blasts: 0 %
Eosinophils Absolute: 0 10*3/uL (ref 0.0–0.7)
Eosinophils Relative: 0 %
HEMATOCRIT: 23.2 % — AB (ref 36.0–46.0)
HEMOGLOBIN: 6.9 g/dL — AB (ref 12.0–15.0)
LYMPHS PCT: 11 %
Lymphs Abs: 1.8 10*3/uL (ref 0.7–4.0)
MCH: 26.1 pg (ref 26.0–34.0)
MCHC: 29.7 g/dL — AB (ref 30.0–36.0)
MCV: 87.9 fL (ref 78.0–100.0)
MONOS PCT: 5 %
Metamyelocytes Relative: 1 %
Monocytes Absolute: 0.8 10*3/uL (ref 0.1–1.0)
Myelocytes: 0 %
NEUTROS ABS: 14.2 10*3/uL — AB (ref 1.7–7.7)
Neutrophils Relative %: 79 %
OTHER: 0 %
Platelets: 175 10*3/uL (ref 150–400)
Promyelocytes Relative: 0 %
RBC: 2.64 MIL/uL — ABNORMAL LOW (ref 3.87–5.11)
RDW: 18.6 % — ABNORMAL HIGH (ref 11.5–15.5)
WBC: 16.8 10*3/uL — ABNORMAL HIGH (ref 4.0–10.5)
nRBC: 0 /100 WBC

## 2017-08-23 LAB — PROTIME-INR
INR: 1.28
Prothrombin Time: 15.9 seconds — ABNORMAL HIGH (ref 11.4–15.2)

## 2017-08-23 LAB — URINALYSIS, COMPLETE (UACMP) WITH MICROSCOPIC
BILIRUBIN URINE: NEGATIVE
Glucose, UA: NEGATIVE mg/dL
KETONES UR: NEGATIVE mg/dL
Nitrite: POSITIVE — AB
PROTEIN: 30 mg/dL — AB
Specific Gravity, Urine: 1.012 (ref 1.005–1.030)
pH: 5 (ref 5.0–8.0)

## 2017-08-23 LAB — GLUCOSE, CAPILLARY
GLUCOSE-CAPILLARY: 60 mg/dL — AB (ref 65–99)
Glucose-Capillary: 102 mg/dL — ABNORMAL HIGH (ref 65–99)
Glucose-Capillary: 33 mg/dL — CL (ref 65–99)
Glucose-Capillary: 79 mg/dL (ref 65–99)

## 2017-08-23 LAB — HEMOGLOBIN AND HEMATOCRIT, BLOOD
HCT: 23.2 % — ABNORMAL LOW (ref 36.0–46.0)
Hemoglobin: 6.9 g/dL — CL (ref 12.0–15.0)

## 2017-08-23 LAB — TROPONIN I: TROPONIN I: 0.26 ng/mL — AB (ref <0.03)

## 2017-08-23 LAB — MRSA PCR SCREENING: MRSA BY PCR: POSITIVE — AB

## 2017-08-23 LAB — POC OCCULT BLOOD, ED: FECAL OCCULT BLD: POSITIVE — AB

## 2017-08-23 LAB — CBG MONITORING, ED: GLUCOSE-CAPILLARY: 73 mg/dL (ref 65–99)

## 2017-08-23 LAB — I-STAT CG4 LACTIC ACID, ED: Lactic Acid, Venous: 0.67 mmol/L (ref 0.5–1.9)

## 2017-08-23 LAB — LACTIC ACID, PLASMA: LACTIC ACID, VENOUS: 0.8 mmol/L (ref 0.5–1.9)

## 2017-08-23 MED ORDER — PIPERACILLIN-TAZOBACTAM 3.375 G IVPB 30 MIN
3.3750 g | Freq: Once | INTRAVENOUS | Status: AC
  Administered 2017-08-23: 3.375 g via INTRAVENOUS
  Filled 2017-08-23: qty 50

## 2017-08-23 MED ORDER — DEXTROSE 50 % IV SOLN
50.0000 mL | Freq: Once | INTRAVENOUS | Status: AC
  Administered 2017-08-23 (×2): 50 mL via INTRAVENOUS

## 2017-08-23 MED ORDER — LABETALOL HCL 5 MG/ML IV SOLN
10.0000 mg | Freq: Once | INTRAVENOUS | Status: AC
  Administered 2017-08-23: 10 mg via INTRAVENOUS
  Filled 2017-08-23: qty 4

## 2017-08-23 MED ORDER — FAMOTIDINE IN NACL 20-0.9 MG/50ML-% IV SOLN
20.0000 mg | INTRAVENOUS | Status: DC
  Administered 2017-08-23: 20 mg via INTRAVENOUS
  Filled 2017-08-23: qty 50

## 2017-08-23 MED ORDER — FAMOTIDINE IN NACL 20-0.9 MG/50ML-% IV SOLN
20.0000 mg | Freq: Two times a day (BID) | INTRAVENOUS | Status: DC
  Filled 2017-08-23: qty 50

## 2017-08-23 MED ORDER — SODIUM CHLORIDE 0.9 % IV SOLN
INTRAVENOUS | Status: DC
  Administered 2017-08-23: 14:00:00 via INTRAVENOUS

## 2017-08-23 MED ORDER — DEXTROSE 50 % IV SOLN: 1.0000 | Freq: Once | INTRAVENOUS | Status: AC

## 2017-08-23 MED ORDER — SODIUM CHLORIDE 0.9 % IV BOLUS
1000.0000 mL | Freq: Once | INTRAVENOUS | Status: AC
  Administered 2017-08-23: 1000 mL via INTRAVENOUS

## 2017-08-23 MED ORDER — SODIUM CHLORIDE 0.9 % IV BOLUS
1500.0000 mL | Freq: Once | INTRAVENOUS | Status: AC
  Administered 2017-08-23: 1500 mL via INTRAVENOUS

## 2017-08-23 MED ORDER — SODIUM CHLORIDE 0.9 % IV SOLN: INTRAVENOUS | Status: DC

## 2017-08-23 MED ORDER — SODIUM CHLORIDE 0.9 % IV SOLN: 250.0000 mL | INTRAVENOUS | Status: DC | PRN

## 2017-08-23 MED ORDER — HYDRALAZINE HCL 20 MG/ML IJ SOLN
10.0000 mg | INTRAMUSCULAR | Status: DC | PRN
  Filled 2017-08-23: qty 1

## 2017-08-23 MED ORDER — DEXTROSE 50 % IV SOLN
INTRAVENOUS | Status: AC
  Administered 2017-08-23: 50 mL via INTRAVENOUS
  Filled 2017-08-23: qty 50

## 2017-08-23 MED ORDER — VANCOMYCIN HCL 10 G IV SOLR
1500.0000 mg | INTRAVENOUS | Status: DC
  Administered 2017-08-25: 1500 mg via INTRAVENOUS
  Filled 2017-08-23: qty 1500

## 2017-08-23 MED ORDER — VANCOMYCIN HCL IN DEXTROSE 1-5 GM/200ML-% IV SOLN
1000.0000 mg | Freq: Once | INTRAVENOUS | Status: AC
  Administered 2017-08-23: 1000 mg via INTRAVENOUS
  Filled 2017-08-23: qty 200

## 2017-08-23 MED ORDER — DEXTROSE-NACL 5-0.45 % IV SOLN
INTRAVENOUS | Status: AC
  Administered 2017-08-23: 18:00:00 via INTRAVENOUS
  Administered 2017-08-24: 50 mL/h via INTRAVENOUS

## 2017-08-23 MED ORDER — PIPERACILLIN-TAZOBACTAM IN DEX 2-0.25 GM/50ML IV SOLN
2.2500 g | Freq: Three times a day (TID) | INTRAVENOUS | Status: DC
  Administered 2017-08-23 – 2017-08-24 (×2): 2.25 g via INTRAVENOUS
  Filled 2017-08-23 (×2): qty 50

## 2017-08-23 MED ORDER — DEXTROSE 50 % IV SOLN
1.0000 | Freq: Once | INTRAVENOUS | Status: AC
  Administered 2017-08-23: 50 mL via INTRAVENOUS
  Filled 2017-08-23: qty 50

## 2017-08-23 MED ORDER — SODIUM CHLORIDE 0.9% FLUSH
10.0000 mL | Freq: Two times a day (BID) | INTRAVENOUS | Status: DC
  Administered 2017-08-24: 10 mL
  Administered 2017-08-24: 20 mL
  Administered 2017-08-25: 10 mL

## 2017-08-23 MED ORDER — SODIUM CHLORIDE 0.9% FLUSH: 10.0000 mL | INTRAVENOUS | Status: DC | PRN

## 2017-08-23 MED ORDER — SODIUM CHLORIDE 0.9 % IV BOLUS
500.0000 mL | Freq: Once | INTRAVENOUS | Status: AC
  Administered 2017-08-24: 500 mL via INTRAVENOUS

## 2017-08-23 MED ORDER — SODIUM CHLORIDE 0.9 % IV SOLN: Freq: Once | INTRAVENOUS | Status: DC

## 2017-08-23 MED ORDER — HYDROCORTISONE NA SUCCINATE PF 100 MG IJ SOLR
50.0000 mg | Freq: Four times a day (QID) | INTRAMUSCULAR | Status: DC
  Administered 2017-08-23 – 2017-08-25 (×7): 50 mg via INTRAVENOUS
  Filled 2017-08-23 (×7): qty 2

## 2017-08-23 NOTE — ED Notes (Addendum)
Pt having multiple loose BM- pt cleaned and repositioned in bed. Pressure ulcer to coccyx noted, cleaned, and new dressing applied.

## 2017-08-23 NOTE — H&P (Signed)
PULMONARY / CRITICAL CARE MEDICINE   Name: Jennifer Delgado MRN: [id number] DOB: 10-31-1950    ADMISSION DATE:  08/16/2017  REFERRING MD:  Vanita Panda - EDP   CHIEF COMPLAINT:  AMS   HISTORY OF PRESENT ILLNESS:  Pt is encephelopathic; therefore, this HPI is obtained from chart review. Jennifer Delgado is a 67yo female with hx metastatic NCSLCA (malignant effusion, mets to brain, liver, lung, possible spleen) with several recent admissions beginning 3/28-4/19 with hyponatremia, PNA, STEMI, VFib arrest and again 4/22-4/30 with hypovolemic v septic shock.  She was discharged to [facility] rehab. Was brought to [facility] ED 5/22 after being found by staff at [place] with [name], dysphasia.  Apparently pt normally able to talk but on day of presentation was only tracking and nodding.  CT of the head was concerning for acute infarction over right frontotemporal lobe.  Also found to have hypotension (SBP in 70s and 80s), SCr 4.03, K 5.4, WBC 16.8, Hgb 6.9, FOBT positive.  2u PRBC were ordered by EDP.  She was started on IVF resuscitation.  PCCM was called for admission to ICU.  Neurology has been consulted for concerns of CVA.  PAST MEDICAL HISTORY :  She  has a past medical history of Brain cancer ([place]), Cancer ([place]), Cellulitis, Hypertension, Liver cancer ([place]), Non-small cell lung cancer with metastasis ([place]) (06/30/2017), and Renal disorder.  PAST SURGICAL HISTORY: She  has a past surgical history that includes Thoracentesis; Bronchoscopy; and Leg Surgery.  Allergies  Allergen Reactions  . Daptomycin Other (See Comments)    Rhyabdomyolosis (a breakdown of muscle tissue that releases a damaging protein into the blood)  . Dilaudid [Hydromorphone] Nausea And Vomiting    No current facility-administered medications on file prior to encounter.    Current Outpatient Medications on File Prior to Encounter  Medication Sig  . albuterol (PROAIR HFA) 108 (90 Base) MCG/ACT inhaler Inhale 2 puffs into the  lungs every 6 (six) hours as needed for wheezing or shortness of breath.   Marland Kitchen amiodarone (PACERONE) 200 MG tablet Take 1 tablet (200 mg total) by mouth daily.  Marland Kitchen aspirin 81 MG chewable tablet Chew 1 tablet (81 mg total) by mouth daily.  Marland Kitchen atorvastatin (LIPITOR) 40 MG tablet Take 1 tablet (40 mg total) by mouth daily at 6 PM.  . cetirizine (ZYRTEC) 10 MG tablet Take 10 mg by mouth at bedtime.  . diclofenac sodium (VOLTAREN) 1 % GEL Apply 2 g topically every 6 (six) hours as needed (for pain).   Marland Kitchen diphenoxylate-atropine (LOMOTIL) 2.5-0.025 MG tablet Take 1 tablet by mouth 4 (four) times daily as needed for diarrhea or loose stools. (Patient taking differently: Take 1 tablet by mouth every 6 (six) hours as needed for diarrhea or loose stools. )  . furosemide (LASIX) 20 MG tablet Take 20 mg by mouth daily.   Marland Kitchen gabapentin (NEURONTIN) 300 MG capsule Take 900 mg by mouth at bedtime.  . isosorbide mononitrate (IMDUR) 30 MG 24 hr tablet Take 30 mg by mouth daily.  Marland Kitchen lisinopril (PRINIVIL,ZESTRIL) 10 MG tablet Take 10 mg by mouth daily.  Marland Kitchen liver oil-zinc oxide (DESITIN) 40 % ointment Apply topically as needed for irritation. (Patient not taking: Reported on 07/24/2017)  . Lorlatinib 100 MG TABS Take 100 mg by mouth daily.  . metoprolol tartrate (LOPRESSOR) 25 MG tablet Take 1 tablet (25 mg total) by mouth 2 (two) times daily.  . ondansetron (ZOFRAN) 4 MG tablet Take 4 mg by mouth every 8 (eight) hours as needed  for nausea or vomiting.  . OXYGEN Inhale 2 L into the lungs continuous.  . pantoprazole (PROTONIX) 40 MG tablet Take 1 tablet (40 mg total) by mouth daily.  . potassium chloride SA (K-DUR,KLOR-CON) 20 MEQ tablet Take 20 mEq by mouth daily.   . sertraline (ZOLOFT) 100 MG tablet Take 1 tablet (100 mg total) by mouth daily.  Marland Kitchen triamcinolone cream (KENALOG) 0.1 % Apply 1 application topically every 12 (twelve) hours as needed (for rash following chemo).     FAMILY HISTORY:  Her has no family status  information on file.    SOCIAL HISTORY: She  reports that she has never smoked. She has never used smokeless tobacco. She reports that she drinks alcohol. She reports that she does not use drugs.  REVIEW OF SYSTEMS:  Unable to obtain as pt is encephalopathic.  SUBJECTIVE:  Awake, tries to answer some questions, SBP in low 90s now.  VITAL SIGNS: BP (!) 82/36   Pulse 91   Temp 98.6 F (37 C) (Rectal)   Resp 13   SpO2 98%   HEMODYNAMICS:    VENTILATOR SETTINGS:    INTAKE / OUTPUT: No intake/output data recorded.  PHYSICAL EXAMINATION: General: Chronically ill appearing female, resting in bed, critically ill. Neuro: Awake, tries to answer some questions. MAE's though is weak. HEENT: [place]/AT. EOMI, sclerae anicteric. Cardiovascular: RRR, no M/R/G.  Lungs: Respirations even and unlabored.  Coarse throughout. Abdomen: Obese, BS x 4, soft, NT/ND.  Musculoskeletal: No gross deformities, no edema.  Skin: Intact, warm, no rashes.   LABS:  BMET Recent Labs  Lab 08/04/2017 1011  NA 137  K 5.4*  CL 110  CO2 18*  BUN 103*  CREATININE 4.03*  GLUCOSE 77    Electrolytes Recent Labs  Lab 08/04/2017 1011  CALCIUM 8.3*    CBC Recent Labs  Lab 08/06/2017 1011  WBC 16.8*  HGB 6.9*  HCT 23.2*  PLT 175    Coag's Recent Labs  Lab 08/13/2017 1011  INR 1.28    Sepsis Markers Recent Labs  Lab 08/28/2017 1017  LATICACIDVEN 0.67    ABG No results for input(s): PHART, PCO2ART, PO2ART in the last 168 hours.  Liver Enzymes Recent Labs  Lab 08/18/2017 1011  AST 14*  ALT 9*  ALKPHOS 81  BILITOT 0.4  ALBUMIN 2.0*    Cardiac Enzymes No results for input(s): TROPONINI, PROBNP in the last 168 hours.  Glucose Recent Labs  Lab 08/29/2017 1001  GLUCAP 73    Imaging Ct Head Wo Contrast  Result Date: 08/03/2017 CLINICAL DATA:  Altered level of consciousness. EXAM: CT HEAD WITHOUT CONTRAST TECHNIQUE: Contiguous axial images were obtained from the base of the skull  through the vertex without intravenous contrast. COMPARISON:  MRI scan of July 12, 2017.  CT scan of July 03, 2017. FINDINGS: Brain: Mild diffuse cortical atrophy is noted. Mild chronic ischemic white matter disease is noted. New low density and sulcal effacement is seen involving right frontotemporal lobe concerning for acute infarction. No midline shift is noted. Ventricular size is within normal limits. Chronic ischemic white matter disease is noted. No hemorrhage is noted. Vascular: No hyperdense vessel or unexpected calcification. Skull: Normal. Negative for fracture or focal lesion. Sinuses/Orbits: No acute finding. Other: None. IMPRESSION: New low density and sulcal effacement is seen involving right frontotemporal lobe concerning for acute infarction. Further evaluation with MRI is recommended. Electronically Signed   By: Marijo Conception, M.D.   On: 08/04/2017 12:27   Dg Chest  Port 1 View  Result Date: 08/03/2017 CLINICAL DATA:  Loss of consciousness EXAM: PORTABLE CHEST 1 VIEW COMPARISON:  07/24/2017 FINDINGS: Cardiac shadow is at the upper limits of normal in size. Patchy changes are again seen in lung bases bilaterally. Mild vascular congestion is noted. No sizable IMPRESSION: Effusion is mild vascular congestion and patchy infiltrates bilaterally. Electronically Signed   By: Inez Catalina M.D.   On: 08/17/2017 10:06     STUDIES:  CT head 5/22 > concerning for acute infarct over right frontotemporal lobe. MRI brain 5/22 >  Echo 4/1 > EF 60-65%, G1DD.   CULTURES: Blood 5/22 >  Urine 5/22 >  Sputum 5/22 >   ANTIBIOTICS: Vanc 5/22 >  Zosyn 5/22 >   SIGNIFICANT EVENTS: 5/22 > admit.  LINES/TUBES: None.  DISCUSSION: 67 y.o. F with hx metastatic NSCLC and recent V.fib arrest, PNA, admitted 5/22 with AMS, hypotension, GI bleed.  Concern for acute CVA, neurology being consulted.  ASSESSMENT / PLAN:  PULMONARY A: Acute hypoxic respiratory failure - currently protecting airway but  certainly at risk for decompensation / requiring intubation. Hx NSCLC with widespread mets to brain, liver, lung, possible spleen. P:   Monitor closely. Continue supplemental O2 as needed to maintain SpO2 > 92%. Bronchial hygiene.  CARDIOVASCULAR A:  Hypotension - suspect multifactorial, hypovolemic, hemorrhagic, possible component septic. Hx V.fib arrest, NSTEMI, HTN. P:  Continue IVF's, PRBC's. Goal MAP > 65. Might need pressors if family opts for aggressive care (would favor against this). Trend lactate, troponin. Stress steroids. Assess cortisol, d/c roids if > 20. Hold preadmission amiodarone, aspirin, atorvastatin, furosemide, imdur, lisinopril, lopressor.  RENAL A:   Mild hyperkalemia. NAGMA. AKI - is making urine.  Exacerbated by ACEi and diuretics. P:   Repeat BMP at 1500. Continue fluids. Strict I/O's. Follow BMP. Might need renal consult if no improvement.  GASTROINTESTINAL A:   GI bleed - FOBT positive. Nutrition. Hx NSCLC with widespread mets to brain, liver, lung, possible spleen. Hx dysphagia and vocal cord dysfunction following prior intubation - on nectar thick liquids per SLP recs on prior admit. P:   GI consulted. NPO. MBS once stabilized.  HEMATOLOGIC / ONCOLOGIC A:   Anemia - chronic but now worse.  FOBT positive. VTE prophylaxis. Hx NSCLC with widespread mets to brain, liver, lung, possible spleen. P:  Being transfused 2u from ED. Follow H/H q6hrs. SCD's only. F/u with oncology.  INFECTIOUS A:   R/o sepsis. P:   Continue empiric abx (vanc / zosyn). Follow cultures.  ENDOCRINE A:   Probable AI - cortisol 11 on prior admit. P:   Stress steroids. Assess cortisol, d/c roids if > 20.  NEUROLOGIC A:   Concern for acute CVA - CT concerning for right frontotemporal infarct. AMS - multifactorial. Hx NSCLC with widespread mets to brain, liver, lung, possible spleen. P:   Neurology consulted. Limit sedating meds. Needs goals of  care discussion as her condition appears to be declining over recent months.  Would favor DNR, attempted to call family but no answer (spouse). Hold preadmission gabapentin, sertraline.  FAMILY  - Updates: No family available.  Attempted to call spouse Waunita Schooner [facility]) but no answer.  Needs goals of care discussion as her condition appears to be declining over recent months.  Would favor DNR, attempted to call family but no answer (spouse).  - Inter-disciplinary family meet or Palliative Care meeting due by:  08/29/17.  CC time: 40 min.   Montey Hora, PA - [facility] Pager: [phone]  or [phone] 08/21/2017, 2:16 PM

## 2017-08-23 NOTE — Consult Note (Addendum)
Reason for Consult:guaiac positive anemia Referring Physician: Hospital team  Jennifer Delgado is an 67 y.o. female.  HPI: patient seen and examined and hospital computer chart reviewed and she is very lethargic but able to answer yes and no questions and case discussed with the ER physician and they are attempting to reach the husband to obtain a DO NOT RESUSCITATE statushowever she is not complaining of any abdominal pain and has not seen any GI blood loss and has not had a colonoscopy or endoscopy before and did have a recent stroke and is being treated aggressively for metastatic lung cancer but has no other specific complaints and she is on an aspirin a day but also on a pump inhibitor as well  Past Medical History:  Diagnosis Date  . Brain cancer ([place])   . Cancer ([place])    lung  . Cardiac arrest ([place])   . Cellulitis   . Hypertension   . Liver cancer ([place])   . Non-small cell lung cancer with metastasis ([place]) 06/30/2017  . Renal disorder     Past Surgical History:  Procedure Laterality Date  . BRONCHOSCOPY    . LEG SURGERY    . THORACENTESIS      History reviewed. No pertinent family history.  Social History:  reports that she has never smoked. She has never used smokeless tobacco. She reports that she drinks alcohol. She reports that she does not use drugs.  Allergies:  Allergies  Allergen Reactions  . Daptomycin Other (See Comments)    Rhyabdomyolosis (a breakdown of muscle tissue that releases a damaging protein into the blood)  . Dilaudid [Hydromorphone] Nausea And Vomiting    Medications: I have reviewed the patient's current medications.  Results for orders placed or performed during the hospital encounter of 08/16/2017 (from the past 48 hour(s))  CBG monitoring, ED     Status: None   Collection Time: 08/29/2017 10:01 AM  Result Value Ref Range   Glucose-Capillary 73 65 - 99 mg/dL  Comprehensive metabolic panel     Status: Abnormal   Collection Time: 08/19/2017 10:11 AM   Result Value Ref Range   Sodium 137 135 - 145 mmol/L   Potassium 5.4 (H) 3.5 - 5.1 mmol/L    Comment: NO VISIBLE HEMOLYSIS   Chloride 110 101 - 111 mmol/L   CO2 18 (L) 22 - 32 mmol/L   Glucose, Bld 77 65 - 99 mg/dL   BUN 103 (H) 6 - 20 mg/dL   Creatinine, Ser 4.03 (H) 0.44 - 1.00 mg/dL   Calcium 8.3 (L) 8.9 - 10.3 mg/dL   Total Protein 6.4 (L) 6.5 - 8.1 g/dL   Albumin 2.0 (L) 3.5 - 5.0 g/dL   AST 14 (L) 15 - 41 U/L   ALT 9 (L) 14 - 54 U/L   Alkaline Phosphatase 81 38 - 126 U/L   Total Bilirubin 0.4 0.3 - 1.2 mg/dL   GFR calc non Af Amer 11 (L) >60 mL/min   GFR calc Af Amer 12 (L) >60 mL/min    Comment: (NOTE) The eGFR has been calculated using the CKD EPI equation. This calculation has not been validated in all clinical situations. eGFR's persistently <60 mL/min signify possible Chronic Kidney Disease.    Anion gap 9 5 - 15    Comment: Performed at [facility] [address], [place], [place] [address]  CBC WITH DIFFERENTIAL     Status: Abnormal   Collection Time: 08/02/2017 10:11 AM  Result Value Ref  Range   WBC 16.8 (H) 4.0 - 10.5 K/uL    Comment: WHITE COUNT CONFIRMED ON SMEAR   RBC 2.64 (L) 3.87 - 5.11 MIL/uL   Hemoglobin 6.9 (LL) 12.0 - 15.0 g/dL    Comment: REPEATED TO VERIFY CRITICAL RESULT CALLED TO, READ BACK BY AND VERIFIED WITH: C FELTS RN 1051 08/02/2017 BY A BENNETT    HCT 23.2 (L) 36.0 - 46.0 %   MCV 87.9 78.0 - 100.0 fL   MCH 26.1 26.0 - 34.0 pg   MCHC 29.7 (L) 30.0 - 36.0 g/dL   RDW 18.6 (H) 11.5 - 15.5 %   Platelets 175 150 - 400 K/uL   Neutrophils Relative % 79 %   Lymphocytes Relative 11 %   Monocytes Relative 5 %   Eosinophils Relative 0 %   Basophils Relative 0 %   Band Neutrophils 4 %   Metamyelocytes Relative 1 %   Myelocytes 0 %   Promyelocytes Relative 0 %   Blasts 0 %   nRBC 0 0 /100 WBC   Other 0 %   Neutro Abs 14.2 (H) 1.7 - 7.7 K/uL   Lymphs Abs 1.8 0.7 - 4.0 K/uL   Monocytes Absolute 0.8 0.1 - 1.0 K/uL   Eosinophils  Absolute 0.0 0.0 - 0.7 K/uL   Basophils Absolute 0.0 0.0 - 0.1 K/uL   RBC Morphology POLYCHROMASIA PRESENT     Comment: ELLIPTOCYTES BURR CELLS TEARDROP CELLS    WBC Morphology MILD LEFT SHIFT (1-5% METAS, OCC MYELO, OCC BANDS)     Comment: VACUOLATED NEUTROPHILS Performed at [facility] [address] [place], [place] [address]   Protime-INR     Status: Abnormal   Collection Time: 08/30/2017 10:11 AM  Result Value Ref Range   Prothrombin Time 15.9 (H) 11.4 - 15.2 seconds   INR 1.28     Comment: Performed at [facility] [address] [place], [place] [address]  Urinalysis, Complete w Microscopic     Status: Abnormal   Collection Time: 08/19/2017 10:12 AM  Result Value Ref Range   Color, Urine AMBER (A) YELLOW    Comment: BIOCHEMICALS MAY BE AFFECTED BY COLOR   APPearance TURBID (A) CLEAR   Specific Gravity, Urine 1.012 1.005 - 1.030   pH 5.0 5.0 - 8.0   Glucose, UA NEGATIVE NEGATIVE mg/dL   Hgb urine dipstick SMALL (A) NEGATIVE   Bilirubin Urine NEGATIVE NEGATIVE   Ketones, ur NEGATIVE NEGATIVE mg/dL   Protein, ur 30 (A) NEGATIVE mg/dL   Nitrite POSITIVE (A) NEGATIVE   Leukocytes, UA LARGE (A) NEGATIVE   RBC / HPF 0-5 0 - 5 RBC/hpf   WBC, UA >50 (H) 0 - 5 WBC/hpf   Bacteria, UA MANY (A) NONE SEEN   WBC Clumps PRESENT    Hyaline Casts, UA PRESENT     Comment: Performed at [facility], [address] [place], [place] [address]  I-Stat CG4 Lactic Acid, ED  (not at [facility])     Status: None   Collection Time: 08/18/2017 10:17 AM  Result Value Ref Range   Lactic Acid, Venous 0.67 0.5 - 1.9 mmol/L  POC occult blood, ED     Status: Abnormal   Collection Time: 08/04/2017 11:00 AM  Result Value Ref Range   Fecal Occult Bld POSITIVE (A) NEGATIVE  Type and screen [place]     Status: None (Preliminary result)   Collection Time: 08/02/2017 12:38 PM  Result Value Ref  Range   ABO/RH(D) O POS    Antibody Screen NEG    Sample Expiration 08/26/2017     Unit Number [id number]    Blood Component Type RED CELLS,LR    Unit division 00    Status of Unit ALLOCATED    Transfusion Status OK TO TRANSFUSE    Crossmatch Result      Compatible Performed at [facility], [place] [address] [place], [place] [address]    Unit Number [id number]    Blood Component Type RED CELLS,LR    Unit division 00    Status of Unit ALLOCATED    Transfusion Status OK TO TRANSFUSE    Crossmatch Result Compatible   Prepare RBC     Status: None   Collection Time: 08/28/2017 12:38 PM  Result Value Ref Range   Order Confirmation      ORDER PROCESSED BY BLOOD BANK Performed at [facility], [place] [address], [place], [place] [address]     Ct Head Wo Contrast  Result Date: 08/04/2017 CLINICAL DATA:  Altered level of consciousness. EXAM: CT HEAD WITHOUT CONTRAST TECHNIQUE: Contiguous axial images were obtained from the base of the skull through the vertex without intravenous contrast. COMPARISON:  MRI scan of July 12, 2017.  CT scan of July 03, 2017. FINDINGS: Brain: Mild diffuse cortical atrophy is noted. Mild chronic ischemic white matter disease is noted. New low density and sulcal effacement is seen involving right frontotemporal lobe concerning for acute infarction. No midline shift is noted. Ventricular size is within normal limits. Chronic ischemic white matter disease is noted. No hemorrhage is noted. Vascular: No hyperdense vessel or unexpected calcification. Skull: Normal. Negative for fracture or focal lesion. Sinuses/Orbits: No acute finding. Other: None. IMPRESSION: New low density and sulcal effacement is seen involving right frontotemporal lobe concerning for acute infarction. Further evaluation with MRI is recommended. Electronically Signed   By: Marijo Conception, M.D.   On: 08/15/2017 12:27   Dg Chest Port 1 View  Result Date: 08/13/2017 CLINICAL DATA:  Loss of consciousness EXAM: PORTABLE CHEST 1 VIEW COMPARISON:  07/24/2017 FINDINGS:  Cardiac shadow is at the upper limits of normal in size. Patchy changes are again seen in lung bases bilaterally. Mild vascular congestion is noted. No sizable IMPRESSION: Effusion is mild vascular congestion and patchy infiltrates bilaterally. Electronically Signed   By: Inez Catalina M.D.   On: 08/17/2017 10:06    ROSnegative except above Blood pressure (!) 94/54, pulse 92, temperature 98.6 F (37 C), temperature source Rectal, resp. rate 13, height _0  (1.626 m), weight 113.1 kg (249 lb 5.4 oz), SpO2 96 %. Physical Exampatient lying quietly in the bed seems to answer yes to questions appropriately exam pertinent for her abdomen being soft occasional bowel sounds nontender labs pertinent for acute renal failure hemoglobin minimal drop from baseline MCV about the same recent CT showing liver metastases and gallstones. No significant otherGI abnormalities  Assessment/Plan: Guaiac positive anemia in patient with multiple medical problems Plan: Await discussion about DO NOT RESUSCITATE status please call if signs of active bleeding otherwise happy to proceed with endoscopy when okay from neurologic standpoint and when hemodynamically stable and expect hemoglobin to drop as BUN and creatinine drop and needs kidney ultrasound to make sure hydronephrosis is not playing a role and if not consider renal consult and we will check on tomorrow  [facility] 08/17/2017, 3:31 PM

## 2017-08-23 NOTE — Consult Note (Signed)
NEURO HOSPITALIST CONSULT NOTE    Requesting Physician: Dr. [place] Bells    Chief Complaint: Altered Mental Status and Dysphasia  History obtained from:  Chart review  HPI:                                                                                                                                         Jennifer Delgado is an 67 y.o. female  With PMH of metastatic NCSLCA (malignant effusion, mets to brain, liver, lung, possible spleen), HTN, obesity, chronic respiratory failure. Notably patient was recently [facility] to [facility] 4[facility]22-4[facility]30 for shock ( hypovolemic vs septic).   Patient was brought to the ED today 08/16/2017 by EMS after being found with AMS and dysphagia. Patient was found by staff at [facility]. Per staff patient is able to talk at baseline.  CT of head was done and showed a frontotemporal lobe acute infarction. UA, urine cx, ordered. INR- 1.28 PT: 15.9 CMP:  Elevated BUN:103, Creatinine 4.03 Decreased: AST 14, ALT 9,GFR 11, albumin 2.0    Date last known well: Unable to determine Time last known well: Unable to determine tPA Given: No: outside window  Modified Rankin: Rankin Score=4    Past Medical History:  Diagnosis Date  . Brain cancer ([place])   . Cancer ([place])    lung  . Cardiac arrest ([place])   . Cellulitis   . Hypertension   . Liver cancer ([place])   . Non-small cell lung cancer with metastasis ([place]) 06/30/2017  . Renal disorder     Past Surgical History:  Procedure Laterality Date  . BRONCHOSCOPY    . LEG SURGERY    . THORACENTESIS      History reviewed. No pertinent family history.      Social History:  reports that she has never smoked. She has never used smokeless tobacco. She reports that she drinks alcohol. She reports that she does not use drugs.  Allergies:  Allergies  Allergen Reactions  . Daptomycin Other (See Comments)    Rhyabdomyolosis (a breakdown of muscle tissue that releases a damaging protein into the blood)  .  Dilaudid [Hydromorphone] Nausea And Vomiting    Medications:                                                                                                                           Prior to Admission:  Medications Prior to Admission  Medication Sig Dispense Refill Last Dose  . acetaminophen (TYLENOL) 325 MG tablet Take 650 mg by mouth every 6 (six) hours as needed for mild pain.   Past Week at Unknown time  . albuterol (PROAIR HFA) 108 (90 Base) MCG/ACT inhaler Inhale 2 puffs into the lungs every 6 (six) hours as needed for wheezing or shortness of breath.    rescue at rescue  . Amino Acids-Protein Hydrolys (FEEDING SUPPLEMENT, PRO-STAT SUGAR FREE 64,) LIQD Take 30 mLs by mouth 3 (three) times daily with meals.   08/22/2017 at Unknown time  . amiodarone (PACERONE) 200 MG tablet Take 1 tablet (200 mg total) by mouth daily. 30 tablet 0 08/22/2017 at Unknown time  . aspirin 81 MG chewable tablet Chew 1 tablet (81 mg total) by mouth daily.   08/22/2017 at Unknown time  . atorvastatin (LIPITOR) 40 MG tablet Take 1 tablet (40 mg total) by mouth daily at 6 PM.   08/22/2017 at Unknown time  . beclomethasone (QVAR REDIHALER) 40 MCG/ACT inhaler Inhale 2 puffs into the lungs See admin instructions. Every 10 hours for SOB   08/07/2017 at Unknown time  . cetirizine (ZYRTEC) 10 MG tablet Take 10 mg by mouth at bedtime.   08/22/2017 at Unknown time  . collagenase (SANTYL) ointment Apply 1 application topically every evening. Apply to sacrum   08/22/2017 at Unknown time  . diclofenac sodium (VOLTAREN) 1 % GEL Apply 2 g topically every 6 (six) hours as needed (for pain).    prn at prn  . diphenoxylate-atropine (LOMOTIL) 2.5-0.025 MG tablet Take 1 tablet by mouth 4 (four) times daily as needed for diarrhea or loose stools. (Patient taking differently: Take 1 tablet by mouth daily. May take 1 tablet by mouth every 6 hours as needed for diarrhea) 120 tablet 0 08/22/2017 at Unknown time  . furosemide (LASIX) 20 MG tablet  Take 20 mg by mouth daily.    08/22/2017 at Unknown time  . gabapentin (NEURONTIN) 300 MG capsule Take 900 mg by mouth at bedtime.   08/22/2017 at Unknown time  . Infant Care Products (DERMACLOUD EX) Apply 1 application topically every 12 (twelve) hours. To peri area for redness   08/22/2017 at Unknown time  . ipratropium-albuterol (DUONEB) 0.5-2.5 (3) MG/3ML SOLN Take 3 mLs by nebulization daily. May inhale 48ml via nebulization every 6 hours as needed for for shortness of breath or wheezing   08/22/2017 at Unknown time  . isosorbide mononitrate (IMDUR) 30 MG 24 hr tablet Take 30 mg by mouth daily.  6 Past Week at Unknown time  . levofloxacin (LEVAQUIN) 750 MG tablet Take 750 mg by mouth daily.   08/22/2017 at Unknown time  . lisinopril (PRINIVIL,ZESTRIL) 10 MG tablet Take 10 mg by mouth daily.   08/22/2017 at Unknown time  . loperamide (IMODIUM A-D) 2 MG tablet Take 2 mg by mouth every 4 (four) hours as needed for diarrhea or loose stools.   prn at prn  . Lorlatinib 100 MG TABS Take 100 mg by mouth daily.   08/22/2017 at Unknown time  . ondansetron (ZOFRAN) 4 MG tablet Take 4 mg by mouth every 6 (six) hours as needed for nausea or vomiting.    Past Week at Unknown time  . oxyCODONE-acetaminophen (PERCOCET/ROXICET) 5-325 MG tablet Take 1 tablet by mouth every 8 (eight) hours as needed for severe pain.   08/22/2017 at Unknown time  . OXYGEN Inhale 2 L into the lungs continuous.   continuous  .  pantoprazole (PROTONIX) 40 MG tablet Take 1 tablet (40 mg total) by mouth daily.   08/22/2017 at Unknown time  . potassium chloride SA (K-DUR,KLOR-CON) 20 MEQ tablet Take 20 mEq by mouth daily.    08/22/2017 at Unknown time  . sertraline (ZOLOFT) 100 MG tablet Take 1 tablet (100 mg total) by mouth daily.   08/22/2017 at Unknown time  . triamcinolone cream (KENALOG) 0.1 % Apply 1 application topically every 12 (twelve) hours as needed (for rash following chemo).    prn at prn  . liver oil-zinc oxide (DESITIN) 40 % ointment  Apply topically as needed for irritation. (Patient not taking: Reported on 07/24/2017) 56.7 g 0 Not Taking at Unknown time  . metoprolol tartrate (LOPRESSOR) 25 MG tablet Take 1 tablet (25 mg total) by mouth 2 (two) times daily. (Patient not taking: Reported on 08/26/2017)   Not Taking at Unknown time   Scheduled: . dextrose  50 mL Intravenous Once  . hydrocortisone sod succinate (SOLU-CORTEF) inj  50 mg Intravenous Q6H   Continuous: . sodium chloride 125 mL/hr at 08/09/2017 1403  . sodium chloride Stopped (08/22/2017 1403)  . sodium chloride Stopped (08/04/2017 1403)  . sodium chloride    . dextrose 5 % and 0.45% NaCl    . piperacillin-tazobactam (ZOSYN)  IV    . [START ON 08/25/2017] vancomycin     DDU:KGURKY chloride  ROS:                                                                                                                                       History obtained from chart review  Unable to obtain due to patients AMS Neurological ROS: as noted in HPI   General Examination:                                                                                                      Blood pressure (!) 94/54, pulse 92, temperature 98.6 F (37 C), temperature source Rectal, resp. rate 13, height 5\' 4"  (1.626 m), weight 113.1 kg (249 lb 5.4 oz), SpO2 96 %.  HEENT-  Normocephalic, no lesions, without obvious abnormality.  Normal external eye and conjunctiva.    Lungs-  no excessive working breathing.  Saturations within normal limits, patient in 2L [place] Extremities- Warm, dry and intact Musculoskeletal-no joint tenderness, deformity or swelling Skin-warm and dry, left leg area of redness covered by dressing.  Neurological Examination Mental Status: Lethargic.  Patient attempts to [facility] and answer some questions when  awake enough.  Able to follow some simple commands such as open and close your eyes, show me a thumbs up. Cranial Nerves: II: UTA III,IV, VI: ptosis not present, patient not  tracking at this time, pupils equal, round, reactive to light and accommodation V,VII:  facial light touch sensation normal bilaterally VIII: hearing normal bilaterally IX,X: UTA XI: bilateral shoulder shrug XII: midline tongue extension Motor: Right : Upper extremity   4/5    Left:     Upper extremity   4/5  Lower extremity   4/5     Lower extremity   4/5 Tone and bulk:normal tone throughout; no atrophy noted Sensory: UTA Deep Tendon Reflexes: 1+ and symmetric throughout Plantars: Right: downgoing   Left: downgoing Cerebellar: UTA Gait: UTA   Lab Results: Basic Metabolic Panel: Recent Labs  Lab 08/25/2017 1011  NA 137  K 5.4*  CL 110  CO2 18*  GLUCOSE 77  BUN 103*  CREATININE 4.03*  CALCIUM 8.3*    CBC: Recent Labs  Lab 08/24/2017 1011  WBC 16.8*  NEUTROABS 14.2*  HGB 6.9*  HCT 23.2*  MCV 87.9  PLT 175    Lipid Panel: No results for input(s): CHOL, TRIG, HDL, CHOLHDL, VLDL, LDLCALC in the last 168 hours.  CBG: Recent Labs  Lab 08/24/2017 1001 08/22/2017 1602  GLUCAP 73 33*    Imaging: Ct Head Wo Contrast  Result Date: 08/21/2017 CLINICAL DATA:  Altered level of consciousness. EXAM: CT HEAD WITHOUT CONTRAST TECHNIQUE: Contiguous axial images were obtained from the base of the skull through the vertex without intravenous contrast. COMPARISON:  MRI scan of July 12, 2017.  CT scan of July 03, 2017. FINDINGS: Brain: Mild diffuse cortical atrophy is noted. Mild chronic ischemic white matter disease is noted. New low density and sulcal effacement is seen involving right frontotemporal lobe concerning for acute infarction. No midline shift is noted. Ventricular size is within normal limits. Chronic ischemic white matter disease is noted. No hemorrhage is noted. Vascular: No hyperdense vessel or unexpected calcification. Skull: Normal. Negative for fracture or focal lesion. Sinuses/Orbits: No acute finding. Other: None. IMPRESSION: New low density and sulcal effacement is  seen involving right frontotemporal lobe concerning for acute infarction. Further evaluation with MRI is recommended. Electronically Signed   By: Marijo Conception, M.D.   On: 08/02/2017 12:27   Dg Chest Port 1 View  Result Date: 08/11/2017 CLINICAL DATA:  Loss of consciousness EXAM: PORTABLE CHEST 1 VIEW COMPARISON:  07/24/2017 FINDINGS: Cardiac shadow is at the upper limits of normal in size. Patchy changes are again seen in lung bases bilaterally. Mild vascular congestion is noted. No sizable IMPRESSION: Effusion is mild vascular congestion and patchy infiltrates bilaterally. Electronically Signed   By: Inez Catalina M.D.   On: 08/28/2017 10:06    Assessment and plan discussed with with attending physician and they are in agreement.    Laurey Morale, MSN, NP-C [facility]   08/14/2017, 4:15 PM   Assessment: 67 y.o. female PMH of metastatic NCSLCA (malignant effusion, mets to brain, liver, lung, possible spleen), HTN, obesity, chronic respiratory failure presents for AMS.  Noted to be hypo-intensive in the emergency room concerning for septic shock.  CT had revealed a subacute right temporal lobe infarct.   Stroke Risk Factors - HTN, malignancy  Etiology: Cardiombolic versus hypercoagulable state from malignancy  Recommend --Avoid hypotension, MAP >90  ( ideal BP is 762-831 mmHg systolic) --Echocardiogram -- Can hold ASA as patient has FOBT + and Hb  is 6.9 requiring transfusion --Carotid Dopplers --HgbA1c, fasting lipid panel --MRI Brain and MRA Head when stable ( not urgent )  --PT consult, OT consult, Speech consult --80 mg of Atorvastatin --Telemetry monitoring --Frequent neuro checks --NPO until passes stroke swallow screen   --please page stroke NP  Or  PA  Or MD from 8am -4 pm  as this patient from this time will be  followed by the stroke.   You can look them up on [email or web address]  Password [email or web address]    NEUROHOSPITALIST ADDENDUM Seen and examined the patient  today. I have reviewed the contents of history and physical exam as documented by PA/ARNP/Resident and agree with above documentation.  I have discussed and formulated the above plan as documented. Edits to the note have been made as needed.    Karena Addison Aroor MD [facility] [phone]   If 7pm to 7am, please call on call as listed on AMION.

## 2017-08-23 NOTE — ED Triage Notes (Signed)
[facility]- pt found altered in her bed by staff at [place]. Pt normally able to talk and right now she is only tracking and nodding. Pt has hx of brain cancer. BP low with EMS, 88 palpated, 20g RAC, 250cc normal saline given PTA. cbg 101, SPO2 90% on 4L HR 92

## 2017-08-23 NOTE — ED Notes (Signed)
Second iv attempted x2 by this RN- unsuccessful

## 2017-08-23 NOTE — Progress Notes (Signed)
Pharmacy Antibiotic Note  Jennifer Delgado is a 67 y.o. female admitted on 08/02/2017 with sepsis.  Pharmacy has been consulted for vancomycin and zosyn dosing. Pt is afebrile and WBC is elevated at 16.8. Pt with significant AKI with SCr of 4.03 (baseline <1). First doses of antibiotics already administered.   Plan: Vancomycin 1500mg  IV Q48H Zosyn 2.25gm IV Q8H F/u renal fxn, C&S, clinical status and trough at SS  Height: 5\' 4"  (162.6 cm) Weight: 249 lb 5.4 oz (113.1 kg) IBW/kg (Calculated) : 54.7  Temp (24hrs), Avg:98.6 F (37 C), Min:98.6 F (37 C), Max:98.6 F (37 C)  Recent Labs  Lab 08/31/2017 1011 08/28/2017 1017  WBC 16.8*  --   CREATININE 4.03*  --   LATICACIDVEN  --  0.67    Estimated Creatinine Clearance: 17.2 mL/min (A) (by C-G formula based on SCr of 4.03 mg/dL (H)).    Allergies  Allergen Reactions  . Daptomycin Other (See Comments)    Rhyabdomyolosis (a breakdown of muscle tissue that releases a damaging protein into the blood)  . Dilaudid [Hydromorphone] Nausea And Vomiting    Antimicrobials this admission: Vanc 5/22>> Zosyn 5/22>>  Dose adjustments this admission: N/A  Microbiology results: Pending  Thank you for allowing pharmacy to be a part of this patient's care.  Terald Jump, Rande Lawman 08/20/2017 2:34 PM

## 2017-08-23 NOTE — ED Notes (Signed)
Second iv attepted x 2 by [employer]- unsuccessful

## 2017-08-23 NOTE — ED Provider Notes (Signed)
[facility] EMERGENCY DEPARTMENT Provider Note   CSN: [id number] Arrival date & time: 08/24/2017  9292     History   Chief Complaint Chief Complaint  Patient presents with  . Altered Mental Status    HPI Jennifer Delgado is a 67 y.o. female.  HPI  "Jennifer Delgado is a 67 year old female with past medical history of non-small cell stage IV carcinoma (diagnosed October 2016, adenocarcinoma with positive ALK gene translocation s/p treatment w/ Viviann Spare and Alectnib) with progression of metastasis to the brain, hypertension, obesity, chronic hypoxic resp failure on baseline 2L home oxygen" (per chart review)   Patient presents from nursing facility with concern of altered mental status. The patient herself is slow of speech, cannot recall what occurred, states that she feels weak, cannot specify an area of weakness. She also complains of headache. It seems as though the patient had a 30-minute period of decreased interactivity, this is reported to EMS providers by the patient's husband. Some concern for patient's oxygen status during this.  The patient is on home oxygen 24/7, and reportedly had loss of her nasal cannula prior to the event. Level 5 caveat secondary to altered mental status. No report of fall, trauma, fever, vomiting, diarrhea, incontinence.  Past Medical History:  Diagnosis Date  . Brain cancer ([place])   . Cancer ([place])    lung  . Cellulitis   . Hypertension   . Liver cancer ([place])   . Non-small cell lung cancer with metastasis ([place]) 06/30/2017  . Renal disorder     Patient Active Problem List   Diagnosis Date Noted  . Ventricular fibrillation ([place]) 08/04/2017  . Cancer related pain 08/04/2017  . Bilateral lower extremity edema 08/04/2017  . Dyslipidemia 08/04/2017  . AKI (acute kidney injury) ([place])   . Shock ([place]) 07/24/2017  . Pressure injury of skin 07/24/2017  . Acute on chronic respiratory failure with hypoxia ([place]) 07/17/2017  . HCAP  (healthcare-associated pneumonia) 07/17/2017  . Cardiac arrest ([place])   . Encounter for central line placement   . Acute encephalopathy   . Hyponatremia 06/30/2017  . Intractable nausea and vomiting 06/30/2017  . Non-small cell lung cancer with metastasis ([place]) 06/30/2017  . Lipodermatosclerosis of lower extremity due to varicose veins 06/30/2017  . Depression 06/30/2017  . GERD (gastroesophageal reflux disease) 06/30/2017    Past Surgical History:  Procedure Laterality Date  . BRONCHOSCOPY    . LEG SURGERY    . THORACENTESIS       OB History   None      Home Medications    Prior to Admission medications   Medication Sig Start Date End Date Taking? Authorizing Provider  albuterol (PROAIR HFA) 108 (90 Base) MCG/ACT inhaler Inhale 2 puffs into the lungs every 6 (six) hours as needed for wheezing or shortness of breath.  12/25/15 04/20/18  [provider]  amiodarone (PACERONE) 200 MG tablet Take 1 tablet (200 mg total) by mouth daily. 07/31/17   Dessa Phi, DO  aspirin 81 MG chewable tablet Chew 1 tablet (81 mg total) by mouth daily. 07/21/17   Doreatha Lew, MD  atorvastatin (LIPITOR) 40 MG tablet Take 1 tablet (40 mg total) by mouth daily at 6 PM. 07/21/17   Patrecia Pour, Christean Grief, MD  cetirizine (ZYRTEC) 10 MG tablet Take 10 mg by mouth at bedtime.    [provider]  diclofenac sodium (VOLTAREN) 1 % GEL Apply 2 g topically every 6 (six) hours as needed (for pain).  [provider]  diphenoxylate-atropine (LOMOTIL) 2.5-0.025 MG tablet Take 1 tablet by mouth 4 (four) times daily as needed for diarrhea or loose stools. Patient taking differently: Take 1 tablet by mouth every 6 (six) hours as needed for diarrhea or loose stools.  07/24/17   Gerlene Fee, NP  furosemide (LASIX) 20 MG tablet Take 20 mg by mouth daily.     [provider]  gabapentin (NEURONTIN) 300 MG capsule Take 900 mg by mouth at bedtime. 04/27/17   [provider]  isosorbide mononitrate (IMDUR) 30 MG 24 hr tablet Take 30 mg by mouth daily. 06/14/17   [provider]  lisinopril (PRINIVIL,ZESTRIL) 10 MG tablet Take 10 mg by mouth daily.    [provider]  liver oil-zinc oxide (DESITIN) 40 % ointment Apply topically as needed for irritation. Patient not taking: Reported on 07/24/2017 07/21/17   Patrecia Pour, Christean Grief, MD  Lorlatinib 100 MG TABS Take 100 mg by mouth daily. 05/16/17   [provider]  metoprolol tartrate (LOPRESSOR) 25 MG tablet Take 1 tablet (25 mg total) by mouth 2 (two) times daily. 07/21/17   Doreatha Lew, MD  ondansetron (ZOFRAN) 4 MG tablet Take 4 mg by mouth every 8 (eight) hours as needed for nausea or vomiting.    [provider]  OXYGEN Inhale 2 L into the lungs continuous.    [provider]  pantoprazole (PROTONIX) 40 MG tablet Take 1 tablet (40 mg total) by mouth daily. 07/21/17   Doreatha Lew, MD  potassium chloride SA (K-DUR,KLOR-CON) 20 MEQ tablet Take 20 mEq by mouth daily.     [provider]  sertraline (ZOLOFT) 100 MG tablet Take 1 tablet (100 mg total) by mouth daily. 07/21/17   Doreatha Lew, MD  triamcinolone cream (KENALOG) 0.1 % Apply 1 application topically every 12 (twelve) hours as needed (for rash following chemo).  05/11/17   [provider]    Family History No family history on file.  Social History Social History   Tobacco Use  . Smoking status: Never Smoker  . Smokeless tobacco: Never Used  Substance Use Topics  . Alcohol use: Yes    Comment: occ  . Drug use: No     Allergies   Daptomycin and Dilaudid [hydromorphone]   Review of Systems Review of Systems  Unable to perform ROS: Mental status change     Physical Exam Updated Vital Signs BP (!) 77/36   Pulse 88   Temp 98.6 F (37 C) (Rectal)   Resp (!) 21   SpO2 100%   Physical Exam  Constitutional: She appears listless. She has a sickly appearance. No  distress.  HENT:  Head: Normocephalic and atraumatic.  Eyes: Conjunctivae and EOM are normal.  Cardiovascular: Normal rate and regular rhythm.  Pulmonary/Chest: Effort normal and breath sounds normal. No stridor. No respiratory distress.  Abdominal: She exhibits no distension.  Musculoskeletal: She exhibits no edema.  Neurological: She appears listless. She displays no tremor. No cranial nerve deficit.  Patient minimally interactive, very slow speech, very brief responses, inconsistent appropriateness  Skin: Skin is warm and dry.  Psychiatric: She is slowed and withdrawn. Cognition and memory are impaired.  Nursing note and vitals reviewed.    ED Treatments / Results  Labs (all labs ordered are listed, but only abnormal results are displayed) Labs Reviewed  COMPREHENSIVE METABOLIC PANEL - Abnormal; Notable for the following components:      Result Value   Potassium  5.4 (*)    CO2 18 (*)    BUN 103 (*)    Creatinine, Ser 4.03 (*)    Calcium 8.3 (*)    Total Protein 6.4 (*)    Albumin 2.0 (*)    AST 14 (*)    ALT 9 (*)    GFR calc non Af Amer 11 (*)    GFR calc Af Amer 12 (*)    All other components within normal limits  CBC WITH DIFFERENTIAL/PLATELET - Abnormal; Notable for the following components:   WBC 16.8 (*)    RBC 2.64 (*)    Hemoglobin 6.9 (*)    HCT 23.2 (*)    MCHC 29.7 (*)    RDW 18.6 (*)    Neutro Abs 14.2 (*)    All other components within normal limits  PROTIME-INR - Abnormal; Notable for the following components:   Prothrombin Time 15.9 (*)    All other components within normal limits  URINALYSIS, COMPLETE (UACMP) WITH MICROSCOPIC - Abnormal; Notable for the following components:   Color, Urine AMBER (*)    APPearance TURBID (*)    Hgb urine dipstick SMALL (*)    Protein, ur 30 (*)    Nitrite POSITIVE (*)    Leukocytes, UA LARGE (*)    WBC, UA >50 (*)    Bacteria, UA MANY (*)    All other components within normal limits  POC OCCULT BLOOD, ED -  Abnormal; Notable for the following components:   Fecal Occult Bld POSITIVE (*)    All other components within normal limits  URINE CULTURE  HIV ANTIBODY (ROUTINE TESTING)  BASIC METABOLIC PANEL  LACTIC ACID, PLASMA  LACTIC ACID, PLASMA  HEMOGLOBIN AND HEMATOCRIT, BLOOD  HEMOGLOBIN AND HEMATOCRIT, BLOOD  URINALYSIS, ROUTINE W REFLEX MICROSCOPIC  CBG MONITORING, ED  I-STAT CG4 LACTIC ACID, ED  TYPE AND SCREEN  PREPARE RBC (CROSSMATCH)    EKG EKG Interpretation  Date/Time:  Wednesday Aug 23 2017 09:52:05 EDT Ventricular Rate:  91 PR Interval:    QRS Duration: 111 QT Interval:  379 QTC Calculation: 467 R Axis:   55 Text Interpretation:  Sinus rhythm Low voltage, precordial leads Abnormal ekg Confirmed by Carmin Muskrat [phone]) on 08/03/2017 10:42:13 AM   Radiology Ct Head Wo Contrast  Result Date: 08/28/2017 CLINICAL DATA:  Altered level of consciousness. EXAM: CT HEAD WITHOUT CONTRAST TECHNIQUE: Contiguous axial images were obtained from the base of the skull through the vertex without intravenous contrast. COMPARISON:  MRI scan of July 12, 2017.  CT scan of July 03, 2017. FINDINGS: Brain: Mild diffuse cortical atrophy is noted. Mild chronic ischemic white matter disease is noted. New low density and sulcal effacement is seen involving right frontotemporal lobe concerning for acute infarction. No midline shift is noted. Ventricular size is within normal limits. Chronic ischemic white matter disease is noted. No hemorrhage is noted. Vascular: No hyperdense vessel or unexpected calcification. Skull: Normal. Negative for fracture or focal lesion. Sinuses/Orbits: No acute finding. Other: None. IMPRESSION: New low density and sulcal effacement is seen involving right frontotemporal lobe concerning for acute infarction. Further evaluation with MRI is recommended. Electronically Signed   By: Marijo Conception, M.D.   On: 08/15/2017 12:27   Dg Chest Port 1 View  Result Date:  08/17/2017 CLINICAL DATA:  Loss of consciousness EXAM: PORTABLE CHEST 1 VIEW COMPARISON:  07/24/2017 FINDINGS: Cardiac shadow is at the upper limits of normal in size. Patchy changes are again seen in lung bases bilaterally.  Mild vascular congestion is noted. No sizable IMPRESSION: Effusion is mild vascular congestion and patchy infiltrates bilaterally. Electronically Signed   By: Inez Catalina M.D.   On: 08/06/2017 10:06    Procedures Procedures (including critical care time)  Medications Ordered in ED Medications  sodium chloride 0.9 % bolus 1,000 mL (0 mLs Intravenous Stopped 08/20/2017 1245)    And  0.9 %  sodium chloride infusion (has no administration in time range)  0.9 %  sodium chloride infusion (has no administration in time range)  vancomycin (VANCOCIN) IVPB 1000 mg/200 mL premix (has no administration in time range)  piperacillin-tazobactam (ZOSYN) IVPB 3.375 g (has no administration in time range)  sodium chloride 0.9 % bolus 1,500 mL (has no administration in time range)  0.9 %  sodium chloride infusion (has no administration in time range)  0.9 %  sodium chloride infusion (has no administration in time range)     Initial Impression / Assessment and Plan / ED Course  I have reviewed the triage vital signs and the nursing notes.  Pertinent labs & imaging results that were available during my care of the patient were reviewed by me and considered in my medical decision making (see chart for details).    Review after the initial evaluation notable for history of recent admission for encephalopathy, hypertension, requiring brief pressor support..   11:55 AM Patient's blood pressure has improved marginally since arrival, now with a systolic greater than 80. Notably, this is approximately 20% greater than her blood pressure on admission 1 month ago. Initial labs also notable for hemoglobin less than 7, with Hemoccult positive status. Creatinine now greater than 4, previously less  than 1. Patient has been receiving fluid resuscitation, has had type and screen, will receive blood transfusion. Patient is now slightly more awake than on arrival, offering her name appropriately, denying focal complaints.  Chart review notable for multiple medical issues, as added to the HPI, including metastatic lung cancer, failure to thrive, recent pneumonia.  2:02 PM Patient's blood pressure has improved marginally with initial fluids, is awaiting blood transfusion. I discussed patient's case with our critical care colleagues for admission to the ICU. Thus far the patient's findings are notable for acute kidney injury, worsening anemia, possible pneumonia, and possible stroke. Given the Hemoccult positive status, anemia, the patient is not a candidate for heparin, anticoagulation, and though CT suggests possible stroke, patient is not stable enough to have MRI, and as she is not a candidate for additional antithrombotic agents, this study may be delayed. Patient has received fluid resuscitation for her kidney failure, antibiotic for possible pneumonia, and has had mild improvement in her mental status. On chart review the patient was noted to have previously been DNR, but was designated full code, seemingly on discharge. Family members are not present yet to discuss findings, but given the patient's critical nature, multiple ongoing maladies, the patient was admitted to the ICU for further evaluation and management.  Final Clinical Impressions(s) / ED Diagnoses   Final diagnoses:  Delirium  AKI (acute kidney injury) ([place])    CRITICAL CARE Performed by: Carmin Muskrat Total critical care time: 35 minutes Critical care time was exclusive of separately billable procedures and treating other patients. Critical care was necessary to treat or prevent imminent or life-threatening deterioration. Critical care was time spent personally by me on the following activities: development of  treatment plan with patient and/or surrogate as well as nursing, discussions with consultants, evaluation of patient's response to treatment,  examination of patient, obtaining history from patient or surrogate, ordering and performing treatments and interventions, ordering and review of laboratory studies, ordering and review of radiographic studies, pulse oximetry and re-evaluation of patient's condition.    Carmin Muskrat, MD 08/22/2017 [phone]

## 2017-08-24 ENCOUNTER — Inpatient Hospital Stay (HOSPITAL_COMMUNITY): Payer: BLUE CROSS/BLUE SHIELD

## 2017-08-24 DIAGNOSIS — I6340 Cerebral infarction due to embolism of unspecified cerebral artery: Secondary | ICD-10-CM | POA: Diagnosis present

## 2017-08-24 DIAGNOSIS — J9621 Acute and chronic respiratory failure with hypoxia: Secondary | ICD-10-CM

## 2017-08-24 LAB — BASIC METABOLIC PANEL
Anion gap: 10 (ref 5–15)
BUN: 74 mg/dL — AB (ref 6–20)
CO2: 16 mmol/L — ABNORMAL LOW (ref 22–32)
CREATININE: 2.38 mg/dL — AB (ref 0.44–1.00)
Calcium: 7.8 mg/dL — ABNORMAL LOW (ref 8.9–10.3)
Chloride: 115 mmol/L — ABNORMAL HIGH (ref 101–111)
GFR calc Af Amer: 23 mL/min — ABNORMAL LOW (ref >60)
GFR, EST NON AFRICAN AMERICAN: 20 mL/min — AB (ref >60)
GLUCOSE: 141 mg/dL — AB (ref 65–99)
Potassium: 4.2 mmol/L (ref 3.5–5.1)
SODIUM: 141 mmol/L (ref 135–145)

## 2017-08-24 LAB — CBC WITH DIFFERENTIAL/PLATELET
Abs Immature Granulocytes: 0.5 10*3/uL — ABNORMAL HIGH (ref 0.0–0.1)
Basophils Absolute: 0 10*3/uL (ref 0.0–0.1)
Basophils Relative: 0 %
EOS PCT: 0 %
Eosinophils Absolute: 0 10*3/uL (ref 0.0–0.7)
HEMATOCRIT: 28.1 % — AB (ref 36.0–46.0)
Hemoglobin: 8.6 g/dL — ABNORMAL LOW (ref 12.0–15.0)
Immature Granulocytes: 2 %
Lymphocytes Relative: 5 %
Lymphs Abs: 1.2 10*3/uL (ref 0.7–4.0)
MCH: 27.9 pg (ref 26.0–34.0)
MCHC: 30.6 g/dL (ref 30.0–36.0)
MCV: 91.2 fL (ref 78.0–100.0)
MONO ABS: 0.4 10*3/uL (ref 0.1–1.0)
MONOS PCT: 2 %
Neutro Abs: 20.8 10*3/uL — ABNORMAL HIGH (ref 1.7–7.7)
Neutrophils Relative %: 91 %
Platelets: 142 10*3/uL — ABNORMAL LOW (ref 150–400)
RBC: 3.08 MIL/uL — ABNORMAL LOW (ref 3.87–5.11)
RDW: 17.3 % — AB (ref 11.5–15.5)
WBC: 23 10*3/uL — ABNORMAL HIGH (ref 4.0–10.5)

## 2017-08-24 LAB — PHOSPHORUS: Phosphorus: 4.4 mg/dL (ref 2.5–4.6)

## 2017-08-24 LAB — GLUCOSE, CAPILLARY
GLUCOSE-CAPILLARY: 39 mg/dL — AB (ref 65–99)
GLUCOSE-CAPILLARY: 95 mg/dL (ref 65–99)
GLUCOSE-CAPILLARY: 99 mg/dL (ref 65–99)
Glucose-Capillary: 101 mg/dL — ABNORMAL HIGH (ref 65–99)
Glucose-Capillary: 103 mg/dL — ABNORMAL HIGH (ref 65–99)
Glucose-Capillary: 104 mg/dL — ABNORMAL HIGH (ref 65–99)
Glucose-Capillary: 113 mg/dL — ABNORMAL HIGH (ref 65–99)
Glucose-Capillary: 141 mg/dL — ABNORMAL HIGH (ref 65–99)

## 2017-08-24 LAB — BPAM RBC
Blood Product Expiration Date: 201906172359
Blood Product Expiration Date: 201906172359
ISSUE DATE / TIME: 201905222223
ISSUE DATE / TIME: 201905221805
Unit Type and Rh: 5100
Unit Type and Rh: 5100

## 2017-08-24 LAB — TYPE AND SCREEN
ABO/RH(D): O POS
Antibody Screen: NEGATIVE
UNIT DIVISION: 0
Unit division: 0

## 2017-08-24 LAB — HEMOGLOBIN AND HEMATOCRIT, BLOOD
HCT: 26.9 % — ABNORMAL LOW (ref 36.0–46.0)
HCT: 27.4 % — ABNORMAL LOW (ref 36.0–46.0)
HEMATOCRIT: 26.9 % — AB (ref 36.0–46.0)
HEMOGLOBIN: 8.5 g/dL — AB (ref 12.0–15.0)
HEMOGLOBIN: 8.7 g/dL — AB (ref 12.0–15.0)
Hemoglobin: 8.4 g/dL — ABNORMAL LOW (ref 12.0–15.0)

## 2017-08-24 LAB — POCT I-STAT 3, ART BLOOD GAS (G3+)
ACID-BASE DEFICIT: 14 mmol/L — AB (ref 0.0–2.0)
BICARBONATE: 13.1 mmol/L — AB (ref 20.0–28.0)
O2 Saturation: 90 %
TCO2: 14 mmol/L — AB (ref 22–32)
pCO2 arterial: 35.5 mmHg (ref 32.0–48.0)
pH, Arterial: 7.174 — CL (ref 7.350–7.450)
pO2, Arterial: 74 mmHg — ABNORMAL LOW (ref 83.0–108.0)

## 2017-08-24 LAB — LACTIC ACID, PLASMA: Lactic Acid, Venous: 1.4 mmol/L (ref 0.5–1.9)

## 2017-08-24 LAB — ECHOCARDIOGRAM COMPLETE
HEIGHTINCHES: 64 in
Weight: 3569.69 oz

## 2017-08-24 LAB — CORTISOL: Cortisol, Plasma: 73.4 ug/dL

## 2017-08-24 LAB — HIV ANTIBODY (ROUTINE TESTING W REFLEX): HIV SCREEN 4TH GENERATION: NONREACTIVE

## 2017-08-24 LAB — TROPONIN I: Troponin I: 0.25 ng/mL (ref <0.03)

## 2017-08-24 LAB — MAGNESIUM: MAGNESIUM: 1.3 mg/dL — AB (ref 1.7–2.4)

## 2017-08-24 MED ORDER — SODIUM BICARBONATE 8.4 % IV SOLN
INTRAVENOUS | Status: DC
  Administered 2017-08-24 – 2017-08-25 (×4): via INTRAVENOUS
  Filled 2017-08-24 (×7): qty 150

## 2017-08-24 MED ORDER — SODIUM BICARBONATE 8.4 % IV SOLN
200.0000 meq | Freq: Once | INTRAVENOUS | Status: AC
  Administered 2017-08-24: 200 meq via INTRAVENOUS
  Filled 2017-08-24: qty 50

## 2017-08-24 MED ORDER — SODIUM CHLORIDE 0.9 % IV BOLUS: 500.0000 mL | Freq: Once | INTRAVENOUS | Status: AC

## 2017-08-24 MED ORDER — MUPIROCIN 2 % EX OINT
1.0000 "application " | TOPICAL_OINTMENT | Freq: Two times a day (BID) | CUTANEOUS | Status: DC
  Administered 2017-08-24 – 2017-08-27 (×6): 1 via NASAL
  Filled 2017-08-24 (×3): qty 22

## 2017-08-24 MED ORDER — NOREPINEPHRINE 4 MG/250ML-% IV SOLN
0.0000 ug/min | INTRAVENOUS | Status: DC
  Administered 2017-08-24: 15 ug/min via INTRAVENOUS
  Filled 2017-08-24 (×2): qty 250

## 2017-08-24 MED ORDER — MAGNESIUM SULFATE 2 GM/50ML IV SOLN
2.0000 g | Freq: Once | INTRAVENOUS | Status: AC
  Administered 2017-08-24: 2 g via INTRAVENOUS
  Filled 2017-08-24: qty 50

## 2017-08-24 MED ORDER — PANTOPRAZOLE SODIUM 40 MG IV SOLR
40.0000 mg | Freq: Two times a day (BID) | INTRAVENOUS | Status: DC
  Administered 2017-08-24 – 2017-08-25 (×3): 40 mg via INTRAVENOUS
  Filled 2017-08-24 (×3): qty 40

## 2017-08-24 MED ORDER — SODIUM CHLORIDE 0.9 % IV SOLN: 8.0000 mg/h | INTRAVENOUS | Status: DC

## 2017-08-24 MED ORDER — PANTOPRAZOLE SODIUM 40 MG IV SOLR: 40.0000 mg | Freq: Two times a day (BID) | INTRAVENOUS | Status: DC

## 2017-08-24 MED ORDER — PERFLUTREN LIPID MICROSPHERE
1.0000 mL | INTRAVENOUS | Status: AC | PRN
  Administered 2017-08-24: 1 mL via INTRAVENOUS
  Filled 2017-08-24: qty 10

## 2017-08-24 MED ORDER — AMMONIUM LACTATE 12 % EX LOTN
TOPICAL_LOTION | CUTANEOUS | Status: AC
  Administered 2017-08-24: 11:00:00 via TOPICAL
  Filled 2017-08-24: qty 225

## 2017-08-24 MED ORDER — SODIUM CHLORIDE 0.9 % IV SOLN: 80.0000 mg | Freq: Once | INTRAVENOUS | Status: DC

## 2017-08-24 MED ORDER — PIPERACILLIN-TAZOBACTAM 3.375 G IVPB
3.3750 g | Freq: Three times a day (TID) | INTRAVENOUS | Status: DC
  Administered 2017-08-24 – 2017-08-25 (×3): 3.375 g via INTRAVENOUS
  Filled 2017-08-24 (×4): qty 50

## 2017-08-24 MED ORDER — WHITE PETROLATUM EX OINT
TOPICAL_OINTMENT | CUTANEOUS | Status: AC
  Administered 2017-08-24: 0.2
  Filled 2017-08-24: qty 28.35

## 2017-08-24 MED ORDER — CHLORHEXIDINE GLUCONATE CLOTH 2 % EX PADS
6.0000 | MEDICATED_PAD | Freq: Every day | CUTANEOUS | Status: DC
  Administered 2017-08-24 – 2017-08-26 (×3): 6 via TOPICAL

## 2017-08-24 MED ORDER — ONDANSETRON HCL 4 MG/2ML IJ SOLN
4.0000 mg | Freq: Three times a day (TID) | INTRAMUSCULAR | Status: DC | PRN
  Administered 2017-08-24: 4 mg via INTRAVENOUS
  Filled 2017-08-24: qty 2

## 2017-08-24 NOTE — Progress Notes (Signed)
  Echocardiogram 2D Echocardiogram has been performed.  Jennifer Delgado 08/24/2017, 10:50 AM

## 2017-08-24 NOTE — Progress Notes (Addendum)
STROKE TEAM PROGRESS NOTE   INTERVAL HISTORY Her RN is at the bedside.  No family present today - other care team members unable to contact. Per RN, husband lives 4 hrs away.   Vitals:   08/24/17 0700 08/24/17 0900 08/24/17 0945 08/24/17 1000  BP: (!) 81/45 110/77    Pulse: 92 90 89 93  Resp: 11 17 17 14   Temp: 98.1 F (36.7 C)     TempSrc: Axillary     SpO2: 100% 97% 99% 98%  Weight:      Height:        CBC:  Recent Labs  Lab 08/05/2017 1011 08/15/2017 1934 08/24/17 0324  WBC 16.8*  --  23.0*  NEUTROABS 14.2*  --  20.8*  HGB 6.9* 6.9* 8.6*  HCT 23.2* 23.2* 28.1*  MCV 87.9  --  91.2  PLT 175  --  142*    Basic Metabolic Panel:  Recent Labs  Lab 08/12/2017 1933 08/24/17 0550  NA 140 141  K 4.5 4.2  CL 115* 115*  CO2 15* 16*  GLUCOSE 71 141*  BUN 86* 74*  CREATININE 3.01* 2.38*  CALCIUM 7.9* 7.8*  MG  --  1.3*  PHOS  --  4.4   Lipid Panel:     Component Value Date/Time   TRIG 134 07/03/2017 1111   HgbA1c: No results found for: HGBA1C Urine Drug Screen: No results found for: LABOPIA, COCAINSCRNUR, LABBENZ, AMPHETMU, THCU, LABBARB  Alcohol Level No results found for: ETH  IMAGING Ct Head Wo Contrast  Result Date: 08/29/2017 CLINICAL DATA:  Altered level of consciousness. EXAM: CT HEAD WITHOUT CONTRAST TECHNIQUE: Contiguous axial images were obtained from the base of the skull through the vertex without intravenous contrast. COMPARISON:  MRI scan of July 12, 2017.  CT scan of July 03, 2017. FINDINGS: Brain: Mild diffuse cortical atrophy is noted. Mild chronic ischemic white matter disease is noted. New low density and sulcal effacement is seen involving right frontotemporal lobe concerning for acute infarction. No midline shift is noted. Ventricular size is within normal limits. Chronic ischemic white matter disease is noted. No hemorrhage is noted. Vascular: No hyperdense vessel or unexpected calcification. Skull: Normal. Negative for fracture or focal lesion.  Sinuses/Orbits: No acute finding. Other: None. IMPRESSION: New low density and sulcal effacement is seen involving right frontotemporal lobe concerning for acute infarction. Further evaluation with MRI is recommended. Electronically Signed   By: Marijo Conception, M.D.   On: 08/02/2017 12:27   Dg Chest Port 1 View  Result Date: 08/24/2017 CLINICAL DATA:  Respiratory failure. EXAM: PORTABLE CHEST 1 VIEW COMPARISON:  Radiograph of Aug 23, 2017. FINDINGS: Stable cardiomegaly with central pulmonary vascular congestion is noted. Interval placement of left-sided PICC line with distal tip in expected position of the SVC. No pneumothorax is noted. Stable bilateral patchy diffuse airspace opacities are noted concerning for pneumonia or edema. Minimal left pleural effusion may be present. Bony thorax is unremarkable. IMPRESSION: Stable bilateral lung opacities are noted concerning for pneumonia or edema. Minimal left pleural effusion is noted. Interval placement of left-sided PICC line with distal tip in expected position of SVC. Electronically Signed   By: Marijo Conception, M.D.   On: 08/24/2017 09:08   Dg Chest Port 1 View  Result Date: 08/24/2017 CLINICAL DATA:  Loss of consciousness EXAM: PORTABLE CHEST 1 VIEW COMPARISON:  07/24/2017 FINDINGS: Cardiac shadow is at the upper limits of normal in size. Patchy changes are again seen in lung bases bilaterally.  Mild vascular congestion is noted. No sizable IMPRESSION: Effusion is mild vascular congestion and patchy infiltrates bilaterally. Electronically Signed   By: Inez Catalina M.D.   On: 08/12/2017 10:06   [place] Ekg Site Rite  Result Date: 08/14/2017 If Site Rite image not attached, placement could not be confirmed due to current cardiac rhythm.  2D Echocardiogram  - Left ventricle: The cavity size was normal. Systolic function was normal. The estimated ejection fraction was in the range of 60% to 65%. Wall motion was normal; there were no regional wall motion  abnormalities. There was an increased relative contribution of atrial contraction to ventricular filling. Doppler parameters are consistent with abnormal left ventricular relaxation (grade 1 diastolic dysfunction). Doppler parameters are consistent with high ventricular filling pressure. - Ventricular septum: D shaped Ventricular septum consistent with pressure and volume overload of the RV. - Aortic valve: Valve area (VTI): 2.13 cm^2. Valve area (Vmax): 2.29 cm^2. Valve area (Vmean): 2.42 cm^2. - Mitral valve: There was trivial regurgitation. Valve area by pressure half-time: 1.32 cm^2. - Right ventricle: The cavity size was moderately dilated. Wall thickness was normal. Systolic function was moderately to severely reduced. - Pulmonic valve: There was mild regurgitation. - Pulmonary arteries: PA peak pressure: 58 mm Hg (S). - Pericardium, extracardiac: A possible, trivial, free-flowing pericardial effusion was identified along the right ventricular free wall. There was a large left pleural effusion. Impressions:   The right ventricular systolic pressure was increased consistent with moderate pulmonary hypertension.   PHYSICAL EXAM General - ill appearing obese caucasian female  HEENT-  Normocephalic, no lesions, without obvious abnormality.  Normal external eye and conjunctiva.   Lungs-  no excessive working breathing.  Saturations within normal limits, patient in 2L [place] Extremities- Warm, dry and intact Musculoskeletal-no joint tenderness, deformity or swelling Skin-warm and dry, left leg area of redness covered by dressing.  Neurological Examination Mental Status: Awake. Will answer intermittently answer questions appropriately. Will not state name or age, nor where she is. Can repeat phrases, perseverates. Can name objects. Able to follow some simple commands such as open and close your eyes, raise your arms Cranial Nerves: II: UTA III,IV, VI: ? L eye ptosis, patient tracking examiner,  pupils equal, round, reactive to light and accommodation V,VII:  facial light touch sensation normal bilaterally VIII: hearing normal bilaterally IX,X: UTA XI: bilateral shoulder shrug XII: midline tongue extension Motor: Right :  Upper extremity   4/5                                      Left:     Upper extremity   4/5             Lower extremity   4/5                                                  Lower extremity   0/5 Tone and bulk:normal tone throughout; no atrophy noted Sensory: no withdrawal to pain LLE. Purposeful movement other extremities. Deep Tendon Reflexes: 1+ and symmetric throughout Plantars: Right: downgoing                                Left: downgoing Cerebellar: UTA Gait:  UTA   ASSESSMENT/PLAN Ms. Kaydense Rizo is a 67 y.o. female with history of metastatic NCSLCA (malignanteffusion, mets to brain, liver, lung, possible spleen), HTN, obesity, chronic respiratory failure. Notably patient was recently admitted to [facility] 4/22-4/30 for shock (hypovolemic vs septic), d/c'd to SNF. Found at SNF with AMS and dysphasia, hypotensive in ED. CT showed a subacute R temporal lobe infarct. Unable to determine LKW, so not a tPA candidate.  Stroke:  Likely acute right frontotemporal and left brain infarct embolic secondary to unknown source  CT head right frontotemporal lobe concerning for acute infarct   2D Echo  EF 60-65%. No source of embolus. Pulm HTN  Consider MRI w/ & w/o and MRA  Consider Carotid Doppler    Consider LDL   Consider HgbA1c   SCDs ordered for VTE prophylaxis, not in place given leg wounds  NPO  aspirin 81 mg daily prior to admission, now on No antithrombotic d/t GIB, anemia.   Therapy recommendations:  pending   Disposition:  pending (from SNF)  Hypertension  Variable, yesterday 55/36-233/182, today 81/45-110/77 . From stroke standpoint, permissive hypertension (OK if < 220/120) but gradually normalize in 5-7 days . Long-term BP goal  normotensive  Hyperlipidemia  Home meds:  lipitor 40  Not resumed in hospital d/t NPO, critical status  LDL goal < 70  Other Stroke Risk Factors  Advanced age  Obesity, Body mass index is 38.3 kg/m., recommend weight loss, diet and exercise as appropriate   Coronary artery disease - hx V Fib arrest, NSTEMI  Other Active Problems  NSCLC  with widespread mets to brain, liver, lung, possible spleen  Acute hypoxic respiratory failure  Shock - multifactorial (sacral wounds, UTI), hypovolemic   AKI 74/2.38  Hypomagnesemia  FOBT positive  Dysphagia and vocal cord dysfunction following prior intubation - on nectar thick liquids per SLP recs on prior admit.  Anemia - chronic but now worse.  FOBT positive.  Improved after 2u PRBC's. 6.9  Probable AI  Leukocytosis, WBC 23  Given multiple critical medical issues and recommendations for DNR, paliiative meeting - will hold further stroke workup at this time. If family wants aggressive care, we can do MRI with and without contrast to futher define actual strokes and / or mets as maximal medical management (lipids, HgbA1c, carotid). Unable to contact family.   Hospital day # 1  Burnetta Sabin, MSN, APRN, ANVP-BC, AGPCNP-BC Advanced Practice Stroke Nurse [place] for Schedule & Pager information 08/24/2017 3:45 PM   ATTENDING NOTE: I reviewed above note and agree with the assessment and plan. I have made any additions or clarifications directly to the above note. Pt was seen and examined.   67 year old female with history of hypertension, obesity, non-small cell lung cancer with metastasis, cardiac arrest on 07/03/2017, recent discharge from [facility] last month for septic shock admitted for altered mental status and hypotension.  Found to have AKI and dehydration, urosepsis and anemia requiring blood transfusion.  GI consulted for positive occult blood.  CT head showed right temporal and left inferior MCA  territory infarcts, embolic pattern, concerning for hypercoagulable state secondary to advanced malignancy versus cardioembolic versus hypotension with cerebral vascular stenosis.  On exam, pt awake alert, however, confused, orientated only to self, but not to age, place, time or people.  Inconsistently blinking to visual threat bilaterally, PERRL, EOMI.  No significant facial droop, tongue midline.  Left upper extremity 3-/5, right upper extremity 4+/5, bilateral lower extremity withdraws to pain.  DTR  1+, no Babinski. Sensation, coordination and gait not tested.  Given patient serious medical condition including hypotension on pressor, anemia requiring blood transfusion, AKI, sepsis, recent cardiothoracic, and advanced metastasis, would like to hold off further stroke work-up until further goal of care clarified.  In addition, given severe anemia and thrombocytosis, patient not a good candidate for anticoagulation and even antiplatelet therapy.  Would hold off antiplatelet or anticoagulant at this time.  Continue medical treatment for sepsis and hypotension.  Will follow.  Rosalin Hawking, MD PhD Stroke Neurology 08/24/2017 6:06 PM     To contact Stroke Continuity provider, please refer to [email or web address] After hours, contact General Neurology

## 2017-08-24 NOTE — Consult Note (Signed)
[place] Nurse wound consult note Reason for Consult: Multiple wounds; all present on admission Left lower leg has circumferential partial and full thickness wounds.  Per a prior note, "Pt has chronic wounds to left anterior and posterior calf related to lipodermatosclerosis. She is followed by the outpatient wound center and had skin grafts applied in the past."  At the time of my evaluation today in room 2M08, the patient's left lower leg has multiple Xeroform gauzes secured with Kerlex the the left lower leg.  Plan of care for this area will be: Wash with warm, soapy water; pat dry.  Apply LacHydrin lotion.  Cover areas with multiple Xeroform gauzes and secure with Kerlex.  Change daily.  The patient also has a DTI injury to the right lateral heel that measures 2 cm x 2 cm and is currently covered with a foam dressing.  Plan for this site is to continue the foam dressing and add Prevalon boots bilaterally.  These have been ordered.  The sacrum has many wound areas, most of which are DTIs; others are partial or full thickness.  All wound beds are pink except for the DTI areas.  The entire sacra/buttock/coccyx area has erythema and that area measures 12 cm x 13 cm.  Within this darkened, erythematous area scattered sites are DTIs, interspersed with full and partial thickness wounds.  The patient is currently having loose brown stools and is fecally incontinent.  A fecal collection system has been inserted to assist with managing this situation.  The DTI areas will evolve and continue to deteriorate, at which time an enzymatic debriding agent (Santyl) may be indicated.  Additional multiple linear DTI areas are present to the upper posterior and inner thighs all of which appear related to postioning of incontinent briefs prior to arrival to the ICU.  The primary RNs present at the time of my assessment state the patient arrived with this product in place yesterday, and the DTI lines had already developed.  For now,  the treatment for the areas on the buttocks/sacrum/coccyx will be covering with foam dressings and change every 3 days and prn.  Monitoring of the linear areas on the thighs is all that is required at this time.  Some type of dressing may be necessary in the future if these sites deteriorate further.   Monitor the wound area(s) for worsening of condition such as: Signs/symptoms of infection,  Increase in size,  Development of or worsening of odor, Development of pain, or increased pain at the affected locations.  Notify the medical team if any of these develop.  Thank you for the consult.  Discussed plan of care with the patient and bedside nurse.  [place] nurse will not follow at this time.  Please re-consult the [place] team if needed.  Val Riles, RN, MSN, CWOCN, CNS-BC, pager [phone]

## 2017-08-24 NOTE — Progress Notes (Signed)
Pt is ED admission with sys BP 80s, pt able to answer yes and no questions, has garbled speech, pt CBG 33- amp D50 given, pt with poor IV access/leaking, IV team called to assess for PICC, pt CBG rechecked-60,pt given amp D50, MD updated and orders received, nursing will continue to monitor

## 2017-08-24 NOTE — Progress Notes (Signed)
PULMONARY / CRITICAL CARE MEDICINE   Name: Jennifer Delgado MRN: [id number] DOB: [birth year]    ADMISSION DATE:  08/25/2017  REFERRING MD:  Vanita Panda - EDP   CHIEF COMPLAINT:  AMS   HISTORY OF PRESENT ILLNESS:  Pt is encephelopathic; therefore, this HPI is obtained from chart review. Jennifer Delgado is a 67yo female with hx metastatic NCSLCA (malignant effusion, mets to brain, liver, lung, possible spleen) with several recent admissions beginning 3/28-4/19 with hyponatremia, PNA, STEMI, VFib arrest and again 4/22-4/30 with hypovolemic v septic shock.  She was discharged to [place] rehab. Was brought to [facility] ED 5/22 after being found by staff at [place] with [place], dysphasia.  Apparently pt normally able to talk but on day of presentation was only tracking and nodding.  CT of the head was concerning for acute infarction over right frontotemporal lobe.  Also found to have hypotension (SBP in 70s and 80s), SCr 4.03, K 5.4, WBC 16.8, Hgb 6.9, FOBT positive.  2u PRBC were ordered by EDP.  She was started on IVF resuscitation.  PCCM was called for admission to ICU.  Neurology has been consulted for concerns of CVA.   SUBJECTIVE:  On levophed and bicarb; however, levophed now down to 44mcg/min this AM.  Getting echo now. Hgb this AM up to 8.6.  VITAL SIGNS: BP 110/77   Pulse 90   Temp 98.1 F (36.7 C) (Axillary)   Resp 17   Ht 5\' 4"  (1.626 m)   Wt 101.2 kg (223 lb 1.7 oz)   SpO2 97%   BMI 38.30 kg/m   HEMODYNAMICS:    VENTILATOR SETTINGS:    INTAKE / OUTPUT: I/O last 3 completed shifts: In: 1293.4 [I.V.:698; Blood:595.4] Out: 2365 [Urine:2365]  PHYSICAL EXAMINATION: General: Chronically ill appearing female, in NAD. Neuro: Somnolent but tries to open eyes and mouth some answers. MAE's though is weak. HEENT: [place]/AT. EOMI, sclerae anicteric. Cardiovascular: RRR, no M/R/G.  Lungs: Respirations even and unlabored.  Coarse throughout. Abdomen: Obese, BS x 4, soft, NT/ND.   Musculoskeletal: No gross deformities, no edema.  Skin: Intact, warm, no rashes.   LABS:  BMET Recent Labs  Lab 08/28/2017 1011 08/22/2017 1933 08/24/17 0550  NA 137 140 141  K 5.4* 4.5 4.2  CL 110 115* 115*  CO2 18* 15* 16*  BUN 103* 86* 74*  CREATININE 4.03* 3.01* 2.38*  GLUCOSE 77 71 141*    Electrolytes Recent Labs  Lab 08/10/2017 1011 09/01/2017 1933 08/24/17 0550  CALCIUM 8.3* 7.9* 7.8*  MG  --   --  1.3*  PHOS  --   --  4.4    CBC Recent Labs  Lab 08/21/2017 1011 08/22/2017 1934 08/24/17 0324  WBC 16.8*  --  23.0*  HGB 6.9* 6.9* 8.6*  HCT 23.2* 23.2* 28.1*  PLT 175  --  142*    Coag's Recent Labs  Lab 08/22/2017 1011  INR 1.28    Sepsis Markers Recent Labs  Lab 08/24/2017 1017 08/04/2017 1829 08/28/2017 2349  LATICACIDVEN 0.67 0.8 1.4    ABG Recent Labs  Lab 08/24/17 0508  PHART 7.174*  PCO2ART 35.5  PO2ART 74.0*    Liver Enzymes Recent Labs  Lab 08/17/2017 1011  AST 14*  ALT 9*  ALKPHOS 81  BILITOT 0.4  ALBUMIN 2.0*    Cardiac Enzymes Recent Labs  Lab 08/15/2017 1933 08/06/2017 2349  TROPONINI 0.26* 0.25*    Glucose Recent Labs  Lab 08/13/2017 1830 08/20/2017 1957 08/24/2017 2337 08/24/17 0308 08/24/17 0454 08/24/17  0735  GLUCAP 60* 102* 79 95 141* 103*    Imaging Ct Head Wo Contrast  Result Date: 08/13/2017 CLINICAL DATA:  Altered level of consciousness. EXAM: CT HEAD WITHOUT CONTRAST TECHNIQUE: Contiguous axial images were obtained from the base of the skull through the vertex without intravenous contrast. COMPARISON:  MRI scan of July 12, 2017.  CT scan of July 03, 2017. FINDINGS: Brain: Mild diffuse cortical atrophy is noted. Mild chronic ischemic white matter disease is noted. New low density and sulcal effacement is seen involving right frontotemporal lobe concerning for acute infarction. No midline shift is noted. Ventricular size is within normal limits. Chronic ischemic white matter disease is noted. No hemorrhage is noted.  Vascular: No hyperdense vessel or unexpected calcification. Skull: Normal. Negative for fracture or focal lesion. Sinuses/Orbits: No acute finding. Other: None. IMPRESSION: New low density and sulcal effacement is seen involving right frontotemporal lobe concerning for acute infarction. Further evaluation with MRI is recommended. Electronically Signed   By: Marijo Conception, M.D.   On: 08/04/2017 12:27   Dg Chest Port 1 View  Result Date: 08/24/2017 CLINICAL DATA:  Respiratory failure. EXAM: PORTABLE CHEST 1 VIEW COMPARISON:  Radiograph of Aug 23, 2017. FINDINGS: Stable cardiomegaly with central pulmonary vascular congestion is noted. Interval placement of left-sided PICC line with distal tip in expected position of the SVC. No pneumothorax is noted. Stable bilateral patchy diffuse airspace opacities are noted concerning for pneumonia or edema. Minimal left pleural effusion may be present. Bony thorax is unremarkable. IMPRESSION: Stable bilateral lung opacities are noted concerning for pneumonia or edema. Minimal left pleural effusion is noted. Interval placement of left-sided PICC line with distal tip in expected position of SVC. Electronically Signed   By: Marijo Conception, M.D.   On: 08/24/2017 09:08   Dg Chest Port 1 View  Result Date: 08/04/2017 CLINICAL DATA:  Loss of consciousness EXAM: PORTABLE CHEST 1 VIEW COMPARISON:  07/24/2017 FINDINGS: Cardiac shadow is at the upper limits of normal in size. Patchy changes are again seen in lung bases bilaterally. Mild vascular congestion is noted. No sizable IMPRESSION: Effusion is mild vascular congestion and patchy infiltrates bilaterally. Electronically Signed   By: Inez Catalina M.D.   On: 08/24/2017 10:06   [place] Ekg Site Rite  Result Date: 08/28/2017 If Site Rite image not attached, placement could not be confirmed due to current cardiac rhythm.    STUDIES:  CT head 5/22 > concerning for acute infarct over right frontotemporal lobe. MRI brain 5/22 >   Echo 5/23 >  Echo 4/1 > EF 60-65%, G1DD.   CULTURES: Blood 5/22 >  Urine 5/22 > GNR's >  Sputum 5/22 >   ANTIBIOTICS: Vanc 5/22 >  Zosyn 5/22 >   SIGNIFICANT EVENTS: 5/22 > admit.  LINES/TUBES: None.  DISCUSSION: 67 y.o. F with hx metastatic NSCLC and recent V.fib arrest, PNA, admitted 5/22 with AMS, hypotension, GI bleed.  Concern for acute CVA, neurology being consulted.  ASSESSMENT / PLAN:  PULMONARY A: Acute hypoxic respiratory failure - currently protecting airway but certainly at risk for decompensation / requiring intubation. Hx NSCLC with widespread mets to brain, liver, lung, possible spleen. P:   Monitor closely. Continue supplemental O2 as needed to maintain SpO2 > 92%. Bronchial hygiene.  CARDIOVASCULAR A:  Shock - suspect multifactorial, septic (sacral wounds, UTI), hypovolemic, hemorrhagic (very unlikely). Hx V.fib arrest, NSTEMI, HTN. P:  Continue IVF's, PRBC's. Wean levophed to off as able, goal MAP > 65. Continue stress steroids (  cortisol this admit 73 but question timing of draw since prior level on 4/1 was 11). Hold preadmission amiodarone, aspirin, atorvastatin, furosemide, imdur, lisinopril, lopressor.  RENAL A:   Mild hyperkalemia - resolved. NAGMA. AKI - is making urine.  Exacerbated by ACEi, diuretics, shock. Hypomagnesemia. P:   Continue HCO3. Strict I/O's. Follow BMP.  GASTROINTESTINAL A:   FOBT positive - no evidence for GI bleeding. Nutrition. Hx NSCLC with widespread mets to brain, liver, lung, possible spleen. Hx dysphagia and vocal cord dysfunction following prior intubation - on nectar thick liquids per SLP recs on prior admit. P:   GI consulted, appreciate recs to continue PPI for now. No plans for EGD unless evidence of bleeding. NPO. MBS once stabilized.  HEMATOLOGIC / ONCOLOGIC A:   Anemia - chronic but now worse.  FOBT positive.  Improved after 2u PRBC's. VTE prophylaxis. Hx NSCLC with widespread mets to  brain, liver, lung, possible spleen. P:  SCD's only. Follow CBC. F/u with oncology.  INFECTIOUS A:   Shock - certainly component septic from sacral ulcers and UTI (urine culture preliminarily shows GNR's). P:   Continue empiric abx (vanc / zosyn). Follow cultures.  ENDOCRINE A:   Probable AI - cortisol 11 on prior admit.  Note, 73 on this admission though question accuracy. P:   Continue stress steroids for now.  NEUROLOGIC A:   Concern for acute CVA - CT concerning for right frontotemporal infarct, MRI pending. AMS - multifactorial. Hx NSCLC with widespread mets to brain, liver, lung, possible spleen. P:   Neurology consulted, appreciate recs. Limit sedating meds. Needs goals of care discussion as her condition appears to be declining over recent months.  Would favor DNR, attempted to call family but no answer (spouse - attempted to call again today). Hold preadmission gabapentin, sertraline.  FAMILY  - Updates: No family available.  Attempted to call spouse Waunita Schooner [facility]) but no answer.  Needs goals of care discussion as her condition appears to be declining over recent months.  Would favor DNR, attempted to call family but no answer (spouse).  - Inter-disciplinary family meet or Palliative Care meeting due by:  08/29/17.  CC time: 30 min.   Montey Hora, [place] [facility] Pager: [phone]  or [phone] 08/24/2017, 9:41 AM

## 2017-08-24 NOTE — Progress Notes (Signed)
Pharmacy Antibiotic Note  Jennifer Delgado is a 67 y.o. female admitted on 08/09/2017 with sepsis. Potentially wound infections, but is now growing GNR in urine Renal fx improved from SCr 3.01>2.38 overnight  Plan: Adjust zosyn to 3.375 gm iv q8h Continue vanc as ordered Consider de-escalation Fri with cx results  Height: 5\' 4"  (162.6 cm) Weight: 223 lb 1.7 oz (101.2 kg) IBW/kg (Calculated) : 54.7  Temp (24hrs), Avg:98.5 F (36.9 C), Min:98 F (36.7 C), Max:99.1 F (37.3 C)  Recent Labs  Lab 08/14/2017 1011 08/02/2017 1017 08/19/2017 1829 08/06/2017 1933 08/10/2017 2349 08/24/17 0324 08/24/17 0550  WBC 16.8*  --   --   --   --  23.0*  --   CREATININE 4.03*  --   --  3.01*  --   --  2.38*  LATICACIDVEN  --  0.67 0.8  --  1.4  --   --     Estimated Creatinine Clearance: 27.3 mL/min (A) (by C-G formula based on SCr of 2.38 mg/dL (H)).    Allergies  Allergen Reactions  . Daptomycin Other (See Comments)    Rhyabdomyolosis (a breakdown of muscle tissue that releases a damaging protein into the blood)  . Dilaudid [Hydromorphone] Nausea And Vomiting   Levester Fresh, PharmD, BCPS, BCCCP Clinical Pharmacist Clinical phone for 08/24/2017 from 7a-3:30p: [phone] If after 3:30p, please call main pharmacy at: [phone] 08/24/2017 11:11 AM

## 2017-08-24 NOTE — Progress Notes (Signed)
eLink Physician-Brief Progress Note Patient Name: Jennifer Delgado DOB: 1950/09/26 MRN: [id number]   Date of Service  08/24/2017  HPI/Events of Note  Ongoing metabolic acidosis with pH 7.17/35/74/14 with normal lactate.  Has renal insufficiency and GIB.  Acidosis may be contributing to hypotension requiring pressor support  eICU Interventions  Plan: 4 amps of bicarb IVP Bicarb gtt at 125 cc/hr Will need to monitor for volume overload F/U on Hgb     Intervention Category Major Interventions: Acid-Base disturbance - evaluation and management  DETERDING,ELIZABETH 08/24/2017, 5:25 AM

## 2017-08-24 NOTE — Progress Notes (Signed)
Contacted Dr. Katherine Roan with concerns for holding current prn Hydralazine, pt's BP 226/214, wide swings in BP, increasing lethargy, and potential need to protect airway.  Verified manual BP at this time, 105/52.  Will continue to monitor.

## 2017-08-24 NOTE — Procedures (Signed)
Arterial Catheter Insertion Procedure Note Jennifer Delgado [id number] 05-10-1950  Procedure: Insertion of Arterial Catheter  Indications: Blood pressure monitoring  Procedure Details Consent: Risks of procedure as well as the alternatives and risks of each were explained to the (patient/caregiver).  Consent for procedure obtained. and Unable to obtain consent because of altered level of consciousness. Time Out: Verified patient identification, verified procedure, site/side was marked, verified correct patient position, special equipment/implants available, medications/allergies/relevent history reviewed, required imaging and test results available.  Performed  Maximum sterile technique was used including antiseptics, cap, gloves, gown, hand hygiene and mask. Skin prep: Chlorhexidine; local anesthetic administered 22 gauge catheter was inserted into right radial artery using the Seldinger technique. ULTRASOUND GUIDANCE USED: YES Evaluation Blood flow good; BP tracing good. Complications: No apparent complications.   Jennifer Delgado 08/24/2017

## 2017-08-24 NOTE — Progress Notes (Signed)
Received stat order from Dr. Jimmy Footman in [place] for 500cc fluid bolus & levophed infusion due to transient hypotension.

## 2017-08-24 NOTE — Progress Notes (Signed)
Pt received with incomprehensible speech this shift post ischemic stroke.  Consulted CCM via E-link for periods of variation in blood pressure and hypertension.  Received order for 10mg  labetelol times one.  Will continue to monitor.

## 2017-08-24 NOTE — Progress Notes (Signed)
CRITICAL VALUE ALERT  Critical Value:  ABG pH 7.17/PaCO2 35.2/PaO2 73/HCO3 13.1/SaO2 90% on 44%Fi02  Date & Time Notied:  08/24/2017 0525  Provider Notified: Dr. Jimmy Footman  Orders Received/Actions taken: Sodium Bicarbonate orders pending

## 2017-08-24 NOTE — Progress Notes (Addendum)
[facility] Progress Note  Jennifer Delgado 67 y.o. [birth year]   Subjective: Lying in bed. On pressors. No family in room. No rectal bleeding noted.  Objective: Vital signs: Vitals:   08/24/17 0645 08/24/17 0700  BP:  (!) 81/45  Pulse: 94 92  Resp: 12 11  Temp:  98.1 F (36.7 C)  SpO2: 97% 100%    Physical Exam: Gen: somnolent, obese, no acute distress  HEENT: anicteric sclera CV: RRR Chest: CTA B Abd: epigastric and periumbilical tenderness with guarding, soft nondistended, +BS Ext: no edema; excoriations on LLE  Lab Results: Recent Labs    08/21/2017 1933 08/24/17 0550  NA 140 141  K 4.5 4.2  CL 115* 115*  CO2 15* 16*  GLUCOSE 71 141*  BUN 86* 74*  CREATININE 3.01* 2.38*  CALCIUM 7.9* 7.8*  MG  --  1.3*  PHOS  --  4.4   Recent Labs    08/04/2017 1011  AST 14*  ALT 9*  ALKPHOS 81  BILITOT 0.4  PROT 6.4*  ALBUMIN 2.0*   Recent Labs    08/29/2017 1011 08/17/2017 1934 08/24/17 0324  WBC 16.8*  --  23.0*  NEUTROABS 14.2*  --  20.8*  HGB 6.9* 6.9* 8.6*  HCT 23.2* 23.2* 28.1*  MCV 87.9  --  91.2  PLT 175  --  142*      Assessment/Plan: Metastatic lung cancer with heme positive stool and anemia. No overt bleeding and despite hypotension I do not think she is having an active GI bleed but suspect another source like sepsis. From a GI standpoint would manage conservatively with Protonix 40 mg IV Q 12 hours and hold off on EGD unless having overt bleeding but with her multiple comorbidities would recommend conservative management. She would need airway protection with intubation prior to sedation for an EGD if overt bleeding develops. Will sign off. Call Jennifer Delgado back if questions.   Loretto C. 08/24/2017, 9:21 AM  Questions please call [phone] ID: Jennifer Delgado, female   DOB: Nov 15, 1950, 67 y.o.   MRN: [id number]

## 2017-08-24 NOTE — Progress Notes (Addendum)
Received order from Jennelle Human, NP for Q1 CBG's due to continuing trend towards hypoglycemia on D5 1/2NS at 86ml/hr, emergent a-line placement, and abg.

## 2017-08-25 ENCOUNTER — Inpatient Hospital Stay (HOSPITAL_COMMUNITY): Payer: BLUE CROSS/BLUE SHIELD

## 2017-08-25 DIAGNOSIS — I6340 Cerebral infarction due to embolism of unspecified cerebral artery: Principal | ICD-10-CM

## 2017-08-25 DIAGNOSIS — R410 Disorientation, unspecified: Secondary | ICD-10-CM

## 2017-08-25 LAB — GLUCOSE, CAPILLARY
GLUCOSE-CAPILLARY: 130 mg/dL — AB (ref 65–99)
GLUCOSE-CAPILLARY: 87 mg/dL (ref 65–99)
Glucose-Capillary: 128 mg/dL — ABNORMAL HIGH (ref 65–99)
Glucose-Capillary: 139 mg/dL — ABNORMAL HIGH (ref 65–99)
Glucose-Capillary: 115 mg/dL — ABNORMAL HIGH (ref 65–99)
Glucose-Capillary: 98 mg/dL (ref 65–99)

## 2017-08-25 LAB — URINE CULTURE

## 2017-08-25 LAB — BASIC METABOLIC PANEL
Anion gap: 10 (ref 5–15)
BUN: 48 mg/dL — AB (ref 6–20)
CO2: 28 mmol/L (ref 22–32)
Calcium: 8.1 mg/dL — ABNORMAL LOW (ref 8.9–10.3)
Chloride: 111 mmol/L (ref 101–111)
Creatinine, Ser: 1.41 mg/dL — ABNORMAL HIGH (ref 0.44–1.00)
GFR calc Af Amer: 44 mL/min — ABNORMAL LOW (ref >60)
GFR calc non Af Amer: 38 mL/min — ABNORMAL LOW (ref >60)
GLUCOSE: 146 mg/dL — AB (ref 65–99)
POTASSIUM: 3.3 mmol/L — AB (ref 3.5–5.1)
Sodium: 149 mmol/L — ABNORMAL HIGH (ref 135–145)

## 2017-08-25 LAB — HEMOGLOBIN A1C
Hgb A1c MFr Bld: 5.5 % (ref 4.8–5.6)
MEAN PLASMA GLUCOSE: 111.15 mg/dL

## 2017-08-25 LAB — LIPID PANEL
CHOL/HDL RATIO: 3.4 ratio
Cholesterol: 92 mg/dL (ref 0–200)
HDL: 27 mg/dL — AB (ref >40)
LDL Cholesterol: 40 mg/dL (ref 0–99)
Triglycerides: 127 mg/dL (ref <150)
VLDL: 25 mg/dL (ref 0–40)

## 2017-08-25 LAB — MAGNESIUM: MAGNESIUM: 2 mg/dL (ref 1.7–2.4)

## 2017-08-25 LAB — PHOSPHORUS: Phosphorus: 3 mg/dL (ref 2.5–4.6)

## 2017-08-25 MED ORDER — CEFAZOLIN SODIUM-DEXTROSE 2-4 GM/100ML-% IV SOLN
2.0000 g | Freq: Two times a day (BID) | INTRAVENOUS | Status: DC
  Administered 2017-08-25 – 2017-08-27 (×3): 2 g via INTRAVENOUS
  Filled 2017-08-25 (×4): qty 100

## 2017-08-25 MED ORDER — PANTOPRAZOLE SODIUM 40 MG IV SOLR
40.0000 mg | INTRAVENOUS | Status: DC
  Administered 2017-08-26: 40 mg via INTRAVENOUS
  Filled 2017-08-25 (×2): qty 40

## 2017-08-25 MED ORDER — HALOPERIDOL LACTATE 5 MG/ML IJ SOLN: 5.0000 mg | Freq: Once | INTRAMUSCULAR | Status: DC

## 2017-08-25 MED ORDER — LORLATINIB 100 MG PO TABS: 100.0000 mg | ORAL_TABLET | Freq: Every day | ORAL | Status: DC

## 2017-08-25 MED ORDER — FUROSEMIDE 10 MG/ML IJ SOLN
40.0000 mg | Freq: Once | INTRAMUSCULAR | Status: AC
  Administered 2017-08-25: 40 mg via INTRAVENOUS
  Filled 2017-08-25: qty 4

## 2017-08-25 MED ORDER — AMIODARONE HCL 200 MG PO TABS: 200.0000 mg | ORAL_TABLET | Freq: Every day | ORAL | Status: DC

## 2017-08-25 MED ORDER — OXYCODONE-ACETAMINOPHEN 5-325 MG PO TABS: 1.0000 | ORAL_TABLET | Freq: Three times a day (TID) | ORAL | Status: DC | PRN

## 2017-08-25 MED ORDER — POTASSIUM CHLORIDE 10 MEQ/50ML IV SOLN
10.0000 meq | INTRAVENOUS | Status: AC
  Administered 2017-08-25 (×6): 10 meq via INTRAVENOUS
  Filled 2017-08-25 (×6): qty 50

## 2017-08-25 MED ORDER — ATORVASTATIN CALCIUM 40 MG PO TABS: 40.0000 mg | ORAL_TABLET | Freq: Every day | ORAL | Status: DC

## 2017-08-25 MED ORDER — SERTRALINE HCL 50 MG PO TABS: 100.0000 mg | ORAL_TABLET | Freq: Every day | ORAL | Status: DC

## 2017-08-25 MED ORDER — LOPERAMIDE HCL 1 MG/7.5ML PO SUSP: 1.0000 mg | ORAL | Status: DC | PRN

## 2017-08-25 MED ORDER — ASPIRIN 81 MG PO CHEW: 81.0000 mg | CHEWABLE_TABLET | Freq: Every day | ORAL | Status: DC

## 2017-08-25 MED ORDER — IOPAMIDOL (ISOVUE-370) INJECTION 76%
50.0000 mL | Freq: Once | INTRAVENOUS | Status: AC | PRN
  Administered 2017-08-25: 50 mL via INTRAVENOUS

## 2017-08-25 MED ORDER — IOPAMIDOL (ISOVUE-370) INJECTION 76%
INTRAVENOUS | Status: AC
Start: 2017-08-25 — End: 2017-08-26
  Filled 2017-08-25: qty 50

## 2017-08-25 NOTE — Clinical Social Work Note (Signed)
Clinical Social Work Assessment  Patient Details  Name: Jennifer Delgado MRN: [id number] Date of Birth: 1950/09/19  Date of referral:  08/25/17               Reason for consult:  Facility Placement, Discharge Planning                Permission sought to share information with:  Facility Sport and exercise psychologist, Family Supports Permission granted to share information::  No(patient not fully oriented)  Name::     Jennifer Delgado  Agency::  [facility]  Relationship::  spouse  Contact Information:  [phone]  Housing/Transportation Living arrangements for the past 2 months:  [place], Single Family Home Source of Information:  Spouse Patient Interpreter Needed:  None Criminal Activity/Legal Involvement Pertinent to Current Situation/Hospitalization:  No - Comment as needed Significant Relationships:  Spouse, Adult Children Lives with:  Spouse Do you feel safe going back to the place where you live?  Yes Need for family participation in patient care:  Yes (Comment)  Care giving concerns: Patient from [facility] for rehab.   Social Worker assessment / plan: CSW spoke to patient's spouse Jennifer Delgado on the phone. Spouse indicated he has been in contact with MDs and has good understanding of patient's medical condition. Spouse hopeful when patient medically stable she can return to [facility] and continue rehab. Spouse did not have any additional questions. When medically appropriate, new PT/OT evaluations will be needed to qualify patient for SNF again. CSW to follow for medical readiness and support with disposition planning.   Employment status:  Disabled (Comment on whether or not currently receiving Disability) Insurance information:  Managed Care, Medicare(BCBS) PT Recommendations:  Not assessed at this time Information / Referral to community resources:  [place]  Patient/Family's Response to care: Spouse appreciative of care.  Patient/Family's  Understanding of and Emotional Response to Diagnosis, Current Treatment, and Prognosis: Spouse with good understanding of patient's condition and hopeful she can continue rehab when stable.  Emotional Assessment Appearance:  Appears stated age Attitude/Demeanor/Rapport:  Unable to Assess Affect (typically observed):  Unable to Assess Orientation:  Oriented to Self, Oriented to Place Alcohol / Substance use:  Not Applicable Psych involvement (Current and /or in the community):  No (Comment)  Discharge Needs  Concerns to be addressed:  Care Coordination Readmission within the last 30 days:  Yes Current discharge risk:  Physical Impairment, Dependent with Mobility Barriers to Discharge:  Continued Medical Work up, [place], LCSW 08/25/2017, 3:04 PM

## 2017-08-25 NOTE — Progress Notes (Signed)
eLink Physician-Brief Progress Note Patient Name: Jennifer Delgado DOB: 1950/12/10 MRN: [id number]   Date of Service  08/25/2017  HPI/Events of Note  Notified by radiology that patient had acute PE that could be seen on CT angio of head and neck.  Also noted was extensive lung parenchymal disease.  Neck and intracranial vessels are patent.    eICU Interventions  Hold on anticoag given acute CVA and intracranial mets     Intervention Category Intermediate Interventions: Other:  Mauri [place], Mamie Nick 08/25/2017, 9:59 PM

## 2017-08-25 NOTE — Progress Notes (Addendum)
STROKE TEAM PROGRESS NOTE   INTERVAL HISTORY Husband left just before I arrived. Overall, pt looking better. Remains confused. MAEx4. Per CCM note, husband interested in continued aggressive care. Will complete stroke workup. Discussed w/ Dr. Erlinda Delgado.  Vitals:   08/25/17 5093 08/25/17 0500 08/25/17 0758 08/25/17 1131  BP:  127/87    Pulse:  88    Resp:  18    Temp:   98.1 F (36.7 C) 98.2 F (36.8 C)  TempSrc:   Oral Oral  SpO2:  97%    Weight: 99.4 kg (219 lb 2.2 oz)     Height:        CBC:  Recent Labs  Lab 08/18/2017 1011  08/24/17 0324  08/24/17 1546 08/24/17 2258  WBC 16.8*  --  23.0*  --   --   --   NEUTROABS 14.2*  --  20.8*  --   --   --   HGB 6.9*   < > 8.6*   < > 8.5* 8.7*  HCT 23.2*   < > 28.1*   < > 27.4* 26.9*  MCV 87.9  --  91.2  --   --   --   PLT 175  --  142*  --   --   --    < > = values in this interval not displayed.    Basic Metabolic Panel:  Recent Labs  Lab 08/24/17 0550 08/25/17 0448  NA 141 149*  K 4.2 3.3*  CL 115* 111  CO2 16* 28  GLUCOSE 141* 146*  BUN 74* 48*  CREATININE 2.38* 1.41*  CALCIUM 7.8* 8.1*  MG 1.3* 2.0  PHOS 4.4 3.0   Lipid Panel:     Component Value Date/Time   TRIG 134 07/03/2017 1111   HgbA1c:  Lab Results  Component Value Date   HGBA1C 5.5 08/25/2017   Urine Drug Screen: No results found for: LABOPIA, COCAINSCRNUR, LABBENZ, AMPHETMU, THCU, LABBARB  Alcohol Level No results found for: [facility]  IMAGING Dg Chest Port 1 View  Result Date: 08/24/2017 CLINICAL DATA:  Respiratory failure. EXAM: PORTABLE CHEST 1 VIEW COMPARISON:  Radiograph of Aug 23, 2017. FINDINGS: Stable cardiomegaly with central pulmonary vascular congestion is noted. Interval placement of left-sided PICC line with distal tip in expected position of the SVC. No pneumothorax is noted. Stable bilateral patchy diffuse airspace opacities are noted concerning for pneumonia or edema. Minimal left pleural effusion may be present. Bony thorax is unremarkable.  IMPRESSION: Stable bilateral lung opacities are noted concerning for pneumonia or edema. Minimal left pleural effusion is noted. Interval placement of left-sided PICC line with distal tip in expected position of SVC. Electronically Signed   By: Marijo Conception, M.D.   On: 08/24/2017 09:08   [place] Ekg Site Rite  Result Date: 08/19/2017 If Site Rite image not attached, placement could not be confirmed due to current cardiac rhythm.  2D Echocardiogram  - Left ventricle: The cavity size was normal. Systolic function was normal. The estimated ejection fraction was in the range of 60% to 65%. Wall motion was normal; there were no regional wall motion abnormalities. There was an increased relative contribution of atrial contraction to ventricular filling. Doppler parameters are consistent with abnormal left ventricular relaxation (grade 1 diastolic dysfunction). Doppler parameters are consistent with high ventricular filling pressure. - Ventricular septum: D shaped Ventricular septum consistent with pressure and volume overload of the RV. - Aortic valve: Valve area (VTI): 2.13 cm^2. Valve area (Vmax): 2.29 cm^2. Valve area (  Vmean): 2.42 cm^2. - Mitral valve: There was trivial regurgitation. Valve area by pressure half-time: 1.32 cm^2. - Right ventricle: The cavity size was moderately dilated. Wall thickness was normal. Systolic function was moderately to severely reduced. - Pulmonic valve: There was mild regurgitation. - Pulmonary arteries: PA peak pressure: 58 mm Hg (S). - Pericardium, extracardiac: A possible, trivial, free-flowing pericardial effusion was identified along the right ventricular free wall. There was a large left pleural effusion. Impressions:   The right ventricular systolic pressure was increased consistent with moderate pulmonary hypertension.   PHYSICAL EXAM General - ill appearing obese caucasian female  HEENT-  Normocephalic, no lesions, without obvious abnormality.  Normal external  eye and conjunctiva.   Lungs-  no excessive working breathing.  Saturations within normal limits, patient in 2L [place] Extremities- Warm, dry and intact Musculoskeletal-no joint tenderness, deformity or swelling Skin-warm and dry, left leg area of redness covered by dressing.  Neurological Examination Mental Status: Awake. Confused. Oriented to person, not to time or place. Can repeat phrases, perseverates. Can name objects. Able to follow some simple commands such as open and close your eyes, raise your arms Cranial Nerves: II: UTA III,IV, VI: EOMI, PERRL V,VII:  facial light touch sensation normal bilaterally VIII: hearing normal bilaterally IX,X: UTA XI: bilateral shoulder shrug XII: midline tongue extension Motor: Right :  Upper extremity   4/5                                      Left:     Upper extremity   4/5             Lower extremity   4/5                                                  Lower extremity   4/5 Tone and bulk:normal tone throughout; no atrophy noted Sensory: intact bilaterally. Purposeful movement other extremities. Deep Tendon Reflexes: 1+ and symmetric throughout Plantars: Right: downgoing                                Left: downgoing Cerebellar: UTA Gait: UTA   ASSESSMENT/PLAN [name] is a 67 y.o. female with history of metastatic NCSLCA (malignanteffusion, mets to brain, liver, lung, possible spleen), HTN, obesity, chronic respiratory failure. Notably patient was recently admitted to [facility] 4/22-4/30 for shock (hypovolemic vs septic), d/c'd to SNF. Found at SNF with AMS and dysphasia, hypotensive in ED. CT showed a subacute R temporal lobe infarct. Unable to determine LKW, so not a tPA candidate.  Stroke:  Likely acute right frontotemporal infarct embolic secondary to unknown source, left brain infarct subacute r/t previous MI  CT head right frontotemporal lobe concerning for acute infarct   2D Echo  EF 60-65%. No source of embolus. Pulm HTN  MRI  w/ & w/o to look for new strokes, mets (previous MRI showed L brain stroke seen on CT but it did not show R brain stroke)  pending   CTA head and neck pending  LDL pending   HgbA1c pending   SCDs ordered for VTE prophylaxis, not in place given leg wounds  NPO  aspirin 81 mg daily prior to admission, now  on No antithrombotic d/t GIB, anemia.   Therapy recommendations:  PT and OT recommended once pt stable  Disposition:  pending (from SNF)  Hypertension  Variable 65/42 - 131/57 . From stroke standpoint, permissive hypertension (OK if < 220/120) but gradually normalize in 5-7 days . Long-term BP goal normotensive  Hyperlipidemia  Home meds:  lipitor 40  Not resumed in hospital d/t NPO, critical status  LDL pending, goal < 70  Resume statin at d/c  Other Stroke Risk Factors  Advanced age  Obesity, Body mass index is 37.61 kg/m., recommend weight loss, diet and exercise as appropriate   Coronary artery disease - hx V Fib arrest, NSTEMI  Other Active Problems  NSCLC  with widespread mets to brain, liver, lung, possible spleen  Acute hypoxic respiratory failure  Shock - multifactorial (sacral wounds, UTI), hypovolemic   AKI 74/2.38  Hypomagnesemia  FOBT positive  Dysphagia and vocal cord dysfunction following prior intubation - on nectar thick liquids per SLP recs on prior admit.  Anemia - chronic but now worse.  FOBT positive.  Improved after 2u PRBC's. 6.9  Probable AI  Leukocytosis, WBC 23  Hospital day # 2  Burnetta Sabin, MSN, APRN, ANVP-BC, AGPCNP-BC Advanced Practice Stroke Nurse [place] for Schedule & Pager information 08/25/2017 1:44 PM   ATTENDING NOTE: I reviewed above note and agree with the assessment and plan. I have made any additions or clarifications directly to the above note. Pt was seen and examined.   Pt remains in ICU. No acute event overnight. Neuro exam stable and no significant change from yesterday.  As per RN, CCM and family agree on aggressive management and we will complete current stroke work up. Will do MRI brain with and without contrast, CTA head and neck. TTE EF 55-60%, and LDL 40 and A1C 5.5. Pt had MRI brain on 07/12/17 showed multifocal acute infarction consistent with cardioembolic strokes likely due to cardiac arrest. The head CT this time showed left inferior MCA and right temporal hypodensity. The left inferior MCA could be chronic from 07/2017 but the right temporal hypodensity likely to be new. Will follow.  Rosalin Hawking, MD PhD Stroke Neurology 08/25/2017 3:50 PM   To contact Stroke Continuity provider, please refer to [email or web address] After hours, contact General Neurology

## 2017-08-25 NOTE — Evaluation (Signed)
Clinical/Bedside Swallow Evaluation Patient Details  Name: Jennifer Delgado MRN: [id number] Date of Birth: [birth year]  Today's Date: 08/25/2017 Time: SLP Start Time (ACUTE ONLY): 9518 SLP Stop Time (ACUTE ONLY): 1558 SLP Time Calculation (min) (ACUTE ONLY): 20 min  Past Medical History:  Past Medical History:  Diagnosis Date  . Brain cancer ([place])   . Cancer ([place])    lung  . Cardiac arrest ([place])   . Cellulitis   . Hypertension   . Liver cancer ([place])   . Non-small cell lung cancer with metastasis ([place]) 06/30/2017  . Renal disorder    Past Surgical History:  Past Surgical History:  Procedure Laterality Date  . BRONCHOSCOPY    . LEG SURGERY    . THORACENTESIS     HPI:  Jennifer Delgado is well known to the pulmonary and critical care service for a recent hospitalization at [facility] where she had a cardiac arrest on 4/1 in the setting of hyponatremia and pneumonia. She had a lengthy hospitalization but ultimately recovered to the point of being able to go to a nursing home. She was undergoing chemotherapy for her stage IV NSCLC which apparently has a favorable prognosis from a histologic/molecular standpoint. She has several wounds on her sacral area and heels as well as a significant wound on her left leg.  She was brought from the SNF today with confusion, slurred speech and hypotension. In the ER she was noted to be hypotensive, in renal failure, anemic and have evidence of a stroke. Apparently a guaic test on non-bloody stool was positive. No recent hematemesis. PCCM was consulted for multiple acute problems, hypotension.  Head CT showing new low density in sulcal effacemenet in the right frontotemporal lobe concerning for new infarction.     Assessment / Plan / Recommendation Clinical Impression  Clinical swallowing evaluation completed using nectar thick liquids only due to aspiration risk.  Patient is well known to [facility] service from previous admissions.  She has had 2 MBS's (i.e. 07/17/2017,  07/20/2017) and one FEES (i.e.07/25/2017).  The most recent diet recommendation was for a dysphagia 3 diet with nectar thick liquids using a chin tuck.  Patient admitted with hypoxia and AMS.  Most recent chest xray is showing stable bilateral lung opacities concerning for PNA or edema and minmal pleural effusions.  Limited oral mechanism exam completed as the patient was having trouble following commands.  Reduced lingual ROM was seen as well as decreased labial strength.  Volitional cough is very weak and the patient's vocal quality was essentially aphonic leading to diffuculty understanding what she was saying.  The patient was given a spoon sip of nectar thick liquids with anterior loss of almost entire bolus.  Given second bolus she was able to contain the material better but she appeared to have issues triggering a swallow and her respiratory rate increased.  No cough was seen.  Given her history, concern for possible new CVA and current clinical presentation recommend that the patient remain NPO.  ST to reassess for PO readiness.   SLP Visit Diagnosis: Dysphagia, oropharyngeal phase (R13.12)    Aspiration Risk  Severe aspiration risk    Diet Recommendation   NPO  Medication Administration: Via alternative means    Other  Recommendations Oral Care Recommendations: Oral care QID   Follow up Recommendations        Frequency and Duration min 2x/week  2 weeks       Prognosis Prognosis for Safe Diet Advancement: Fair  Swallow Study   General Date of Onset: 08/03/2017 HPI: Jennifer Delgado is well known to the pulmonary and critical care service for a recent hospitalization at [facility] where she had a cardiac arrest on 4/1 in the setting of hyponatremia and pneumonia. She had a lengthy hospitalization but ultimately recovered to the point of being able to go to a nursing home. She was undergoing chemotherapy for her stage IV NSCLC which apparently has a favorable prognosis from a  histologic/molecular standpoint. She has several wounds on her sacral area and heels as well as a significant wound on her left leg.  She was brought from the SNF today with confusion, slurred speech and hypotension. In the ER she was noted to be hypotensive, in renal failure, anemic and have evidence of a stroke. Apparently a guaic test on non-bloody stool was positive. No recent hematemesis. PCCM was consulted for multiple acute problems, hypotension.  Head CT showing new low density in sulcal effacemenet in the right frontotemporal lobe concerning for new infarction.   Type of Study: Bedside Swallow Evaluation Previous Swallow Assessment: See Note Diet Prior to this Study: NPO Temperature Spikes Noted: No Respiratory Status: Nasal cannula History of Recent Intubation: No Behavior/Cognition: Alert;Cooperative;Requires cueing Oral Cavity Assessment: Dry Oral Care Completed by SLP: Yes Oral Cavity - Dentition: Adequate natural dentition Vision: Functional for self-feeding Self-Feeding Abilities: Total assist Patient Positioning: Upright in bed Baseline Vocal Quality: Low vocal intensity Volitional Cough: Weak Volitional Swallow: Able to elicit    Oral/Motor/Sensory Function Overall Oral Motor/Sensory Function: (Unable to fully assess)   Ice Chips Ice chips: Not tested   Thin Liquid Thin Liquid: Not tested    Nectar Thick Nectar Thick Liquid: Impaired Presentation: Spoon Oral Phase Impairments: Reduced labial seal;Reduced lingual movement/coordination Oral phase functional implications: Right anterior spillage;Left anterior spillage;Prolonged oral transit Pharyngeal Phase Impairments: Suspected delayed Swallow   Honey Thick Honey Thick Liquid: Not tested   Puree Puree: Not tested   Solid   GO   Solid: Not tested        Shelly Flatten, MA, CCC-SLP Acute Rehab SLP [phone] Lamar Sprinkles 08/25/2017,4:05 PM

## 2017-08-25 NOTE — Progress Notes (Signed)
Follow up - Critical Care Medicine Note  Patient Details:    Jennifer Delgado is an 67 y.o. female. Known ALK + NSCLC.  Status post cardiac arrest at [facility] in 4/19.  Presents with AMS and hypotension.   Diagnosed septic shock from UTI.   Lines, Airways, Drains: PICC Double Lumen 08/02/2017 PICC Left Brachial 43 cm 0 cm (Active)  Indication for Insertion or Continuance of Line Vasoactive infusions;Poor Vasculature-patient has had multiple peripheral attempts or PIVs lasting less than 24 hours 08/25/2017  8:45 AM  Exposed Catheter (cm) 0 cm 08/13/2017  5:00 PM  Site Assessment Clean;Dry;Intact 08/25/2017  8:45 AM  Lumen #1 Status Infusing 08/25/2017  8:45 AM  Lumen #2 Status In-line blood sampling system in place 08/25/2017  8:45 AM  Dressing Type Transparent 08/25/2017  8:45 AM  Dressing Status Clean;Dry;Intact;Antimicrobial disc in place 08/25/2017  8:45 AM  Dressing Change Due 08/30/17 08/25/2017  8:45 AM     Rectal Tube/Pouch (Active)     Urethral Catheter Loma Newton, RN Double-lumen 14 Fr. (Active)  Indication for Insertion or Continuance of Catheter Unstable critical patients (first 24-48 hours) 08/25/2017  8:00 AM  Site Assessment Clean;Intact 08/25/2017  8:00 AM  Catheter Maintenance Bag below level of bladder;Catheter secured;Drainage bag/tubing not touching floor;Insertion date on drainage bag;No dependent loops;Seal intact 08/25/2017  8:00 AM  Collection Container Leg bag 08/25/2017  8:00 AM  Securement Method Leg strap 08/25/2017  8:00 AM  Urinary Catheter Interventions Unclamped 08/25/2017  8:00 AM  Output (mL) 200 mL 08/25/2017  6:00 AM    Anti-infectives:  Anti-infectives (From admission, onward)   Start     Dose/Rate Route Frequency Ordered Stop   08/25/17 0800  vancomycin (VANCOCIN) 1,500 mg in sodium chloride 0.9 % 500 mL IVPB     1,500 mg 250 mL/hr over 120 Minutes Intravenous Every 48 hours 08/20/2017 1434     08/24/17 1800  piperacillin-tazobactam (ZOSYN) IVPB 3.375 g     3.375  g 12.5 mL/hr over 240 Minutes Intravenous Every 8 hours 08/24/17 1109     09/01/2017 2200  piperacillin-tazobactam (ZOSYN) IVPB 2.25 g  Status:  Discontinued     2.25 g 100 mL/hr over 30 Minutes Intravenous Every 8 hours 08/22/2017 1434 08/24/17 1109   08/21/2017 1315  vancomycin (VANCOCIN) IVPB 1000 mg/200 mL premix     1,000 mg 200 mL/hr over 60 Minutes Intravenous  Once 08/14/2017 1302 08/03/2017 1717   08/18/2017 1315  piperacillin-tazobactam (ZOSYN) IVPB 3.375 g     3.375 g 100 mL/hr over 30 Minutes Intravenous  Once 08/18/2017 1302 08/09/2017 1500      Microbiology: Results for orders placed or performed during the hospital encounter of 08/11/2017  Urine culture     Status: Abnormal   Collection Time: 08/22/2017 11:53 AM  Result Value Ref Range Status   Specimen Description URINE, CATHETERIZED  Final   Special Requests   Final    NONE Performed at [facility], [place] [address] [place], [place] [address]    Culture >=100,000 COLONIES/mL ESCHERICHIA COLI (A)  Final   Report Status 08/25/2017 FINAL  Final   Organism ID, Bacteria ESCHERICHIA COLI (A)  Final      Susceptibility   Escherichia coli - MIC*    AMPICILLIN >=32 RESISTANT Resistant     CEFAZOLIN <=4 SENSITIVE Sensitive     CEFTRIAXONE <=1 SENSITIVE Sensitive     CIPROFLOXACIN >=4 RESISTANT Resistant     GENTAMICIN >=16 RESISTANT Resistant     IMIPENEM <=  0.25 SENSITIVE Sensitive     NITROFURANTOIN <=16 SENSITIVE Sensitive     TRIMETH/SULFA <=20 SENSITIVE Sensitive     AMPICILLIN/SULBACTAM 16 INTERMEDIATE Intermediate     PIP/TAZO <=4 SENSITIVE Sensitive     Extended ESBL NEGATIVE Sensitive     * >=100,000 COLONIES/mL ESCHERICHIA COLI  MRSA PCR Screening     Status: Abnormal   Collection Time: 08/22/2017  3:40 PM  Result Value Ref Range Status   MRSA by PCR POSITIVE (A) NEGATIVE Final    Comment:        The GeneXpert MRSA Assay (FDA approved for NASAL specimens only), is one component of a comprehensive MRSA  colonization surveillance program. It is not intended to diagnose MRSA infection nor to guide or monitor treatment for MRSA infections. RESULT CALLED TO, READ BACK BY AND VERIFIED WITHLarose Kells RN [id number] 08/25/2017 A BROWNING Performed at [facility], [place] [address] [place] [address], [place], [place] [address]   Culture, blood (Routine X 2) w Reflex to ID Panel     Status: None (Preliminary result)   Collection Time: 08/15/2017  7:06 PM  Result Value Ref Range Status   Specimen Description BLOOD SITE NOT SPECIFIED  Final   Special Requests   Final    BOTTLES DRAWN AEROBIC ONLY Blood Culture adequate volume   Culture   Final    NO GROWTH < 24 HOURS Performed at [facility], [place] [address] [place], [place] [address]    Report Status PENDING  Incomplete   Laboratory: CBC    Component Value Date/Time   WBC 23.0 (H) 08/24/2017 0324   RBC 3.08 (L) 08/24/2017 0324   HGB 8.7 (L) 08/24/2017 2258   HCT 26.9 (L) 08/24/2017 2258   PLT 142 (L) 08/24/2017 0324   MCV 91.2 08/24/2017 0324   MCH 27.9 08/24/2017 0324   MCHC 30.6 08/24/2017 0324   RDW 17.3 (H) 08/24/2017 0324   LYMPHSABS 1.2 08/24/2017 0324   MONOABS 0.4 08/24/2017 0324   EOSABS 0.0 08/24/2017 0324   BASOSABS 0.0 08/24/2017 0324   BMET    Component Value Date/Time   NA 149 (H) 08/25/2017 0448   K 3.3 (L) 08/25/2017 0448   CL 111 08/25/2017 0448   CO2 28 08/25/2017 0448   GLUCOSE 146 (H) 08/25/2017 0448   BUN 48 (H) 08/25/2017 0448   CREATININE 1.41 (H) 08/25/2017 0448   CALCIUM 8.1 (L) 08/25/2017 0448   GFRNONAA 38 (L) 08/25/2017 0448   GFRAA 44 (L) 08/25/2017 0448   Best Practice/Protocols:  VTE Prophylaxis: Heparin (SQ) GI Prophylaxis: Proton Pump Inhibitor No protocolized care at this time.  Events:  Weaned off pressors. Ruled in for new CVA vs. Reactivation of recent stroke.  Studies: Ct Head Wo Contrast  Result Date: 08/06/2017 CLINICAL DATA:  Altered level of consciousness. EXAM: CT HEAD WITHOUT CONTRAST  TECHNIQUE: Contiguous axial images were obtained from the base of the skull through the vertex without intravenous contrast. COMPARISON:  MRI scan of July 12, 2017.  CT scan of July 03, 2017. FINDINGS: Brain: Mild diffuse cortical atrophy is noted. Mild chronic ischemic white matter disease is noted. New low density and sulcal effacement is seen involving right frontotemporal lobe concerning for acute infarction. No midline shift is noted. Ventricular size is within normal limits. Chronic ischemic white matter disease is noted. No hemorrhage is noted. Vascular: No hyperdense vessel or unexpected calcification. Skull: Normal. Negative for fracture or focal lesion. Sinuses/Orbits: No acute finding. Other: None. IMPRESSION: New  low density and sulcal effacement is seen involving right frontotemporal lobe concerning for acute infarction. Further evaluation with MRI is recommended. Electronically Signed   By: Marijo Conception, M.D.   On: 08/24/2017 12:27   Dg Chest Port 1 View  Result Date: 08/24/2017 CLINICAL DATA:  Respiratory failure. EXAM: PORTABLE CHEST 1 VIEW COMPARISON:  Radiograph of Aug 23, 2017. FINDINGS: Stable cardiomegaly with central pulmonary vascular congestion is noted. Interval placement of left-sided PICC line with distal tip in expected position of the SVC. No pneumothorax is noted. Stable bilateral patchy diffuse airspace opacities are noted concerning for pneumonia or edema. Minimal left pleural effusion may be present. Bony thorax is unremarkable. IMPRESSION: Stable bilateral lung opacities are noted concerning for pneumonia or edema. Minimal left pleural effusion is noted. Interval placement of left-sided PICC line with distal tip in expected position of SVC. Electronically Signed   By: Marijo Conception, M.D.   On: 08/24/2017 09:08   Dg Chest Port 1 View  Result Date: 08/29/2017 CLINICAL DATA:  Loss of consciousness EXAM: PORTABLE CHEST 1 VIEW COMPARISON:  07/24/2017 FINDINGS: Cardiac  shadow is at the upper limits of normal in size. Patchy changes are again seen in lung bases bilaterally. Mild vascular congestion is noted. No sizable IMPRESSION: Effusion is mild vascular congestion and patchy infiltrates bilaterally. Electronically Signed   By: Inez Catalina M.D.   On: 08/05/2017 10:06   [place] Ekg Site Rite  Result Date: 08/22/2017 If Site Rite image not attached, placement could not be confirmed due to current cardiac rhythm.   Consults: Treatment Team:  Clarene Essex, MD   Subjective:    Overnight Issues:  No overnight issues.   Objective:  Vital signs for last 24 hours: Temp:  [97.4 F (36.3 C)-98.5 F (36.9 C)] 98.2 F (36.8 C) (05/24 1131) Pulse Rate:  [78-89] 88 (05/24 0500) Resp:  [11-23] 18 (05/24 0500) BP: (68-131)/(42-108) 127/87 (05/24 0500) SpO2:  [75 %-99 %] 97 % (05/24 0500) Arterial Line BP: (107-146)/(42-60) 143/56 (05/24 0600) Weight:  [219 lb 2.2 oz (99.4 kg)] 219 lb 2.2 oz (99.4 kg) (05/24 0437)  Hemodynamic parameters for last 24 hours:  Off pressors for >24h  Intake/Output from previous day: 05/23 0701 - 05/24 0700 In: 3906.9 [I.V.:3856.9; IV Piggyback:50] Out: 1950 [Urine:1950]  Intake/Output this shift: No intake/output data recorded.  Vent settings for last 24 hours:  On 4L/min.  Physical Exam:  General: alert and no respiratory distress Neuro: alert, confused, weakness left upper extremity, weakness left lower extremity and speech diminished and L hemineglect HEENT/Neck: JVD mild Resp: rales bibasilar CVS: regular rate and rhythm, S1, S2 normal, no murmur, click, rub or gallop GI: soft, nontender, BS WNL, no r/g and diarrhea in flexiseal. Skin: no rash Extremities: no edema, no erythema, pulses WNL and edema 1+  Assessment/Plan:   NEURO  Altered Mental Status:  confusion. Recent CVA with Lhemiparesis/ hemineglect. May be reactivation of prior CVA during cardiac arrest last month.   Plan: PT consultation.  Resume her  rehabilitation as tolerated.  PULM  Hypoxemia Due to atelectasis.   Plan: Encourage sitting up and deep breathing.  CARDIO  Septic shock (organism identified E. coli) now resolved.  Patient is currently hypertensive.   Plan: Gentle diuresis.  May help oxygen requirement.  RENAL  Chronic Renal Insufficiency creatinine appears at baseline.   Plan: Gentle diuresis.  GI  Nutritional Deficiency Moderate nutritional risk.   Plan: Swallowing evaluation  ID  Acute Pyelonephritis without renal calculi  Plan: Stepdown to cefazolin for E. coli UTI.  Will treat for 14 days.  HEME  Anemia anemia of critical illness) recent iron studies are normal.  No need for further investigation.   Plan: Continue home Protonix.  No indications for transfusion.  ENDO Hyperglycemia (stress related and Currently well controlled.)   Plan: Continue to monitor.  Global Issues   Ready for transfer to hospitalist service.  Can resume outpatient treatment for ALK positive non-small cell lung cancer.overall intermediate and long-term prognosis is not good but husband is reluctant to discuss limitations of care at this point.  Patient remains a full code.   LOS: 2 days   Additional comments:I have discussed and reviewed with family members patient's Husband   Kipp Brood 08/25/2017  *Care during the described time interval was provided by me and/or other providers on the critical care team.  I have reviewed this patient's available data, including medical history, events of note, physical examination and test results as part of my evaluation.

## 2017-08-25 NOTE — Progress Notes (Signed)
eLink Physician-Brief Progress Note Patient Name: Jennifer Delgado DOB: 12-06-1950 MRN: [id number]   Date of Service  08/25/2017  HPI/Events of Note  hypokalemia  eICU Interventions  Potassium replaced     Intervention Category Intermediate Interventions: Electrolyte abnormality - evaluation and management  [name] 08/25/2017, 6:15 AM

## 2017-08-26 ENCOUNTER — Inpatient Hospital Stay (HOSPITAL_COMMUNITY): Payer: BLUE CROSS/BLUE SHIELD

## 2017-08-26 ENCOUNTER — Inpatient Hospital Stay: Payer: Self-pay

## 2017-08-26 DIAGNOSIS — I2699 Other pulmonary embolism without acute cor pulmonale: Secondary | ICD-10-CM | POA: Clinically undetermined

## 2017-08-26 DIAGNOSIS — I82413 Acute embolism and thrombosis of femoral vein, bilateral: Secondary | ICD-10-CM | POA: Clinically undetermined

## 2017-08-26 DIAGNOSIS — J9621 Acute and chronic respiratory failure with hypoxia: Secondary | ICD-10-CM

## 2017-08-26 LAB — GLUCOSE, CAPILLARY
GLUCOSE-CAPILLARY: 170 mg/dL — AB (ref 65–99)
Glucose-Capillary: 107 mg/dL — ABNORMAL HIGH (ref 65–99)
Glucose-Capillary: 120 mg/dL — ABNORMAL HIGH (ref 65–99)
Glucose-Capillary: 148 mg/dL — ABNORMAL HIGH (ref 65–99)

## 2017-08-26 LAB — BASIC METABOLIC PANEL
ANION GAP: 11 (ref 5–15)
BUN: 38 mg/dL — ABNORMAL HIGH (ref 6–20)
CO2: 28 mmol/L (ref 22–32)
Calcium: 8 mg/dL — ABNORMAL LOW (ref 8.9–10.3)
Chloride: 111 mmol/L (ref 101–111)
Creatinine, Ser: 1.22 mg/dL — ABNORMAL HIGH (ref 0.44–1.00)
GFR, EST AFRICAN AMERICAN: 53 mL/min — AB (ref >60)
GFR, EST NON AFRICAN AMERICAN: 45 mL/min — AB (ref >60)
GLUCOSE: 95 mg/dL (ref 65–99)
Potassium: 3 mmol/L — ABNORMAL LOW (ref 3.5–5.1)
Sodium: 150 mmol/L — ABNORMAL HIGH (ref 135–145)

## 2017-08-26 LAB — COMPREHENSIVE METABOLIC PANEL
ALBUMIN: 1.9 g/dL — AB (ref 3.5–5.0)
ALK PHOS: 76 U/L (ref 38–126)
ALT: 8 U/L — ABNORMAL LOW (ref 14–54)
ANION GAP: 10 (ref 5–15)
AST: 33 U/L (ref 15–41)
BILIRUBIN TOTAL: 2 mg/dL — AB (ref 0.3–1.2)
BUN: 34 mg/dL — ABNORMAL HIGH (ref 6–20)
CALCIUM: 7.9 mg/dL — AB (ref 8.9–10.3)
CO2: 29 mmol/L (ref 22–32)
Chloride: 114 mmol/L — ABNORMAL HIGH (ref 101–111)
Creatinine, Ser: 1.2 mg/dL — ABNORMAL HIGH (ref 0.44–1.00)
GFR calc Af Amer: 54 mL/min — ABNORMAL LOW (ref >60)
GFR calc non Af Amer: 46 mL/min — ABNORMAL LOW (ref >60)
GLUCOSE: 97 mg/dL (ref 65–99)
POTASSIUM: 4.6 mmol/L (ref 3.5–5.1)
Sodium: 153 mmol/L — ABNORMAL HIGH (ref 135–145)
TOTAL PROTEIN: 6.2 g/dL — AB (ref 6.5–8.1)

## 2017-08-26 LAB — BLOOD GAS, ARTERIAL
Acid-Base Excess: 5.5 mmol/L — ABNORMAL HIGH (ref 0.0–2.0)
Bicarbonate: 28.8 mmol/L — ABNORMAL HIGH (ref 20.0–28.0)
Drawn by: 10552
O2 CONTENT: 15 L/min
O2 SAT: 90.5 %
PATIENT TEMPERATURE: 98.6
PCO2 ART: 37.4 mmHg (ref 32.0–48.0)
pH, Arterial: 7.499 — ABNORMAL HIGH (ref 7.350–7.450)
pO2, Arterial: 57.2 mmHg — ABNORMAL LOW (ref 83.0–108.0)

## 2017-08-26 LAB — PROCALCITONIN: PROCALCITONIN: 0.56 ng/mL

## 2017-08-26 LAB — LACTIC ACID, PLASMA
LACTIC ACID, VENOUS: 1.6 mmol/L (ref 0.5–1.9)
Lactic Acid, Venous: 1.9 mmol/L (ref 0.5–1.9)

## 2017-08-26 MED ORDER — DEXTROSE 5 % IV SOLN
INTRAVENOUS | Status: DC
  Administered 2017-08-26: 16:00:00 via INTRAVENOUS

## 2017-08-26 MED ORDER — HALOPERIDOL LACTATE 5 MG/ML IJ SOLN: 5.0000 mg | Freq: Four times a day (QID) | INTRAMUSCULAR | Status: DC | PRN

## 2017-08-26 MED ORDER — ORAL CARE MOUTH RINSE
15.0000 mL | Freq: Two times a day (BID) | OROMUCOSAL | Status: DC
  Administered 2017-08-26 (×2): 15 mL via OROMUCOSAL

## 2017-08-26 MED ORDER — CHLORHEXIDINE GLUCONATE 0.12 % MT SOLN
15.0000 mL | Freq: Two times a day (BID) | OROMUCOSAL | Status: DC
  Administered 2017-08-26 – 2017-08-27 (×2): 15 mL via OROMUCOSAL
  Filled 2017-08-26 (×2): qty 15

## 2017-08-26 MED ORDER — FUROSEMIDE 10 MG/ML IJ SOLN
40.0000 mg | Freq: Once | INTRAMUSCULAR | Status: AC
  Administered 2017-08-26: 40 mg via INTRAVENOUS
  Filled 2017-08-26: qty 4

## 2017-08-26 MED ORDER — POTASSIUM CL IN DEXTROSE 5% 20 MEQ/L IV SOLN
20.0000 meq | INTRAVENOUS | Status: DC
  Administered 2017-08-26: 20 meq via INTRAVENOUS
  Filled 2017-08-26: qty 1000

## 2017-08-26 MED ORDER — HEPARIN (PORCINE) IN NACL 100-0.45 UNIT/ML-% IJ SOLN
1550.0000 [IU]/h | INTRAMUSCULAR | Status: DC
  Administered 2017-08-26: 1300 [IU]/h via INTRAVENOUS
  Filled 2017-08-26 (×2): qty 250

## 2017-08-26 MED ORDER — HALOPERIDOL LACTATE 5 MG/ML IJ SOLN: 2.0000 mg | Freq: Four times a day (QID) | INTRAMUSCULAR | Status: DC | PRN

## 2017-08-26 MED ORDER — HALOPERIDOL LACTATE 5 MG/ML IJ SOLN
5.0000 mg | Freq: Four times a day (QID) | INTRAMUSCULAR | Status: DC | PRN
  Administered 2017-08-26: 5 mg via INTRAMUSCULAR
  Filled 2017-08-26: qty 1

## 2017-08-26 MED ORDER — HALOPERIDOL LACTATE 5 MG/ML IJ SOLN
5.0000 mg | Freq: Once | INTRAMUSCULAR | Status: AC
  Administered 2017-08-26: 5 mg via INTRAVENOUS
  Filled 2017-08-26: qty 1

## 2017-08-26 NOTE — Progress Notes (Signed)
ANTICOAGULATION CONSULT NOTE - Initial Consult  Pharmacy Consult for heparin Indication: pulmonary embolus  Allergies  Allergen Reactions  . Daptomycin Other (See Comments)    Rhyabdomyolosis (a breakdown of muscle tissue that releases a damaging protein into the blood)  . Dilaudid [Hydromorphone] Nausea And Vomiting    Patient Measurements: Height: 5\' 4"  (162.6 cm) Weight: 222 lb 0.1 oz (100.7 kg) IBW/kg (Calculated) : 54.7 Heparin Dosing Weight: 81.8 kg  Vital Signs: Temp: 98.9 F (37.2 C) (05/25 1600) Temp Source: Axillary (05/25 1600) BP: 158/117 (05/25 1414) Pulse Rate: 119 (05/25 1414)  Labs: Recent Labs    08/06/2017 1933  08/12/2017 2349 08/24/17 0324  08/24/17 1257 08/24/17 1546 08/24/17 2258 08/25/17 0448 08/26/17 0420 08/26/17 1033  HGB  --    < >  --  8.6*  --  8.4* 8.5* 8.7*  --   --   --   HCT  --    < >  --  28.1*  --  26.9* 27.4* 26.9*  --   --   --   PLT  --   --   --  142*  --   --   --   --   --   --   --   CREATININE 3.01*  --   --   --    < >  --   --   --  1.41* 1.22* 1.20*  TROPONINI 0.26*  --  0.25*  --   --   --   --   --   --   --   --    < > = values in this interval not displayed.     Medical History: Past Medical History:  Diagnosis Date  . Brain cancer ([place])   . Cancer ([place])    lung  . Cardiac arrest ([place])   . Cellulitis   . Hypertension   . Liver cancer ([place])   . Non-small cell lung cancer with metastasis ([place]) 06/30/2017  . Renal disorder     Assessment: Multiple comorbidities Acute PE and DVT High bleed risk Recent acute stroke, no tPA given NSLC with mets to brain Starting heparin gtt  Goal of Therapy:  Heparin level goal: 0.3-0.5 units/mL Monitor platelets by anticoagulation protocol: Yes   Plan:  -Begin heparin at 1300 units/hr -Daily HL, CBC -Check level in 6 hours   Harvel Quale 08/26/2017,5:21 PM

## 2017-08-26 NOTE — Progress Notes (Signed)
Patient transferred to 4N32 with all belongings. Report was called to receiving RN on 4N. Patient's husband Waunita Schooner notified by phone of room change.

## 2017-08-26 NOTE — Progress Notes (Signed)
Dr.Smith notified of picc line being pulled and oxygen demand increase .Will continue to monitor patient for now.

## 2017-08-26 NOTE — Progress Notes (Signed)
SLP Cancellation Note  Patient Details Name: Jennifer Delgado MRN: [id number] DOB: 22-Jun-1950   Cancelled treatment:        Desaturation this am, rapid response called. RN reported pt received Haldol at 6:15 this morning and is not alert. Plan to check back tomorrow however asked RN to page today if she awakens and is appropriate for continued diagnostic treatment for po's.    Jennifer Delgado 08/26/2017, 9:21 AM   Jennifer Delgado.Ed [employer] [phone]

## 2017-08-26 NOTE — Progress Notes (Signed)
Spoke with Eric Form NP and Dr Vaughan [facility] and [facility] re PIV access today and PICC planned to be placed 09-23-17.  Recommend sutures to prevent PICC from being pulled out again since pt repeatedly pulling at lines.

## 2017-08-26 NOTE — Progress Notes (Signed)
PROGRESS NOTE    Patient: Jennifer Delgado     PCP: Daylene Posey, FNP                    DOB: Aug 29, 1950            DOA: 08/29/2017 RUE:[id number]             DOS: 08/26/2017, 2:07 PM   LOS: 3 days   Date of Service: The patient was seen and examined on 08/26/2017  Subjective:  Patient was seen and examined this morning, respond was called this morning as patient was agitated, pulled her PICC line out, was continuously confused agitated. Patient has received 5 mg of IV Haldol, more calm, Vascular tech at bedside reporting she has noted some DVTs on her lower extremities. No family present at bedside Concerning staff, RN present at bedside, believing she may decline quickly.  Nursing staff has reached out to patient's husband, per palliative patient's remains to be full code  ----------------------------------------------------------------------------------------------------------------------  Brief Narrative:  Jennifer Delgado 67 year old female with multiple comorbidities including metastatic non-small lung CA with a malignant effusion, mets to brain, liver, lungs, possible spleen, history of hypertension, obesity, chronic respiratory failure presented and was admitted on 422 05/08/1932 sepsis, septic shock, hypovolemia.  Was DC'd to SNF. Patient was subsequently found and SNF with altered mental status, dysphagia, hypotensive.  CT of the head revealed subacute right temporal lobe infarct, CTA revealed pulmonary embolism, lower extremity studies revealing DVTs.  At the time patient was not a TPA candidate.  Anticoagulation initially was not started as patient was thought to be at high risk post acute CVA.       Active Problems:   Altered mental state   Cerebral embolism with cerebral infarction   Assessment & Plan:   Toxic metabolic encephalopathy/ -Multifactorial including acute stroke -Seems to be confused, agitated  Acute CVA - Right frontotemporal embolic infarct, left brain  infarct subacute, right previous CVA -Neurology following -CT of the head revealing right frontal temporal lobe acute infarct, 2D echocardiogram ejection fraction 60 to 65%, no source of emboli, pulmonary hypertension -MRI of the brain -Allergy following closely -Patient remains to be n.p.o., pending PT/OT/speech evaluation -Currently patient remains to be encephalopathic -Neurology recommending considering starting heparin drip as patient has discovered to have embolic event, PE, DVT -We will transfer the patient to neuro ICU  Hypertension -Patient was initially hypotensive on admission, will allow permissive hypertension per neurology recommendation -We will gradually normalize blood pressure over next 5 to 7 days -Patient still n.p.o., blood pressure medications on hold  Hyperlipidemia -Patient can tolerate p.o. we will resume Lipitor -LDL goal less than 70  Acute pulmonary embolism, DVT bilateral lower extremity, right CFV, SFJ, partially on the left PTV -Heparin drip without bolus -Monitor for bleeding -Treat her risk outweighs the benefit of anticoagulation  Non-small lung cancer with metastases to brain, liver, lung, spleen -Once stable may follow-up with oncology for further treatment -Agree with ICU team patient's prognosis remains to be poor but patient's husband is reluctant to discuss and limitation of care at this time.   Aute on chronic respiratory failure -Patient currently not intubated, on O2 via nasal cannula -We will monitor closely  Anemia of chronic disease -Monitoring H&H, stable  History of chronic kidney disease -Monitoring BUN/creatinine, currently stable  sepsis, status post septic shock,  culture positive for E. Coli/due to UTI/pyelonephritis -Patient has been switched to ceftezole and anticipating 14 days of treatment  DVT prophylaxis:   SCDs/compression stockings/getting heparin drip Code Status:         Full code   Family Communication:  Family was not  present at bedside Disposition Plan:    > 3 days, likely nursing home palliative care, versus hospice           Consultants:   Neurology  Procedures:    CT/MRI/2D echocardiogram/bilateral venous study  Antimicrobials:  Anti-infectives (From admission, onward)   Start     Dose/Rate Route Frequency Ordered Stop   08/25/17 1800  ceFAZolin (ANCEF) IVPB 2g/100 mL premix     2 g 200 mL/hr over 30 Minutes Intravenous Every 12 hours 08/25/17 1209     08/25/17 0800  vancomycin (VANCOCIN) 1,500 mg in sodium chloride 0.9 % 500 mL IVPB  Status:  Discontinued     1,500 mg 250 mL/hr over 120 Minutes Intravenous Every 48 hours 08/12/2017 1434 08/25/17 1209   08/24/17 1800  piperacillin-tazobactam (ZOSYN) IVPB 3.375 g  Status:  Discontinued     3.375 g 12.5 mL/hr over 240 Minutes Intravenous Every 8 hours 08/24/17 1109 08/25/17 1209   08/28/2017 2200  piperacillin-tazobactam (ZOSYN) IVPB 2.25 g  Status:  Discontinued     2.25 g 100 mL/hr over 30 Minutes Intravenous Every 8 hours 08/28/2017 1434 08/24/17 1109   08/28/2017 1315  vancomycin (VANCOCIN) IVPB 1000 mg/200 mL premix     1,000 mg 200 mL/hr over 60 Minutes Intravenous  Once 08/31/2017 1302 08/04/2017 1717   08/02/2017 1315  piperacillin-tazobactam (ZOSYN) IVPB 3.375 g     3.375 g 100 mL/hr over 30 Minutes Intravenous  Once 08/06/2017 1302 08/09/2017 1500      Objective: Vitals:   08/26/17 0253 08/26/17 0510 08/26/17 0758 08/26/17 0900  BP:  (!) 113/40 113/63 120/66  Pulse:  96 91 86  Resp:  19 (!) 29 17  Temp:  97.6 F (36.4 C) 98.6 F (37 C)   TempSrc:  Oral Axillary   SpO2: 94% 95% 100% 100%  Weight:  100.7 kg (222 lb 0.1 oz)    Height:        Intake/Output Summary (Last 24 hours) at 08/26/2017 1407 Last data filed at 08/26/2017 0800 Gross per 24 hour  Intake 430 ml  Output -  Net 430 ml   Filed Weights   08/24/17 0500 08/25/17 0437 08/26/17 0510  Weight: 101.2 kg (223 lb 1.7 oz) 99.4 kg (219 lb 2.2 oz) 100.7 kg (222  lb 0.1 oz)    Examination:  General exam: Confused, agitated, soft restraints Psychiatry: Currently confused but awake, not following any commands HEENT: WNLs Respiratory system: Clear to auscultation. Respiratory effort normal. Cardiovascular system: S1 & S2 heard, RRR. No JVD, murmurs, rubs, gallops or clicks. No pedal  Gastrointestinal system: Abd. nondistended, soft and nontender.  Hyperactive bowel sounds with rectal tube in place Central nervous system: Oriented, confused, with asymmetric weaknesses able to determine fully as patient is not cooperative  Extremities: Asymmetrically weakness noted, can move extremities spontaneously pain stimuli Skin: No rashes, lesions or ulcers Wounds: Per nursing care  Data Reviewed: I have personally reviewed following labs and imaging studies  CBC: Recent Labs  Lab 08/21/2017 1011 08/13/2017 1934 08/24/17 0324 08/24/17 1257 08/24/17 1546 08/24/17 2258  WBC 16.8*  --  23.0*  --   --   --   NEUTROABS 14.2*  --  20.8*  --   --   --   HGB 6.9* 6.9* 8.6* 8.4* 8.5* 8.7*  HCT 23.2*  23.2* 28.1* 26.9* 27.4* 26.9*  MCV 87.9  --  91.2  --   --   --   PLT 175  --  142*  --   --   --    Basic Metabolic Panel: Recent Labs  Lab 08/17/2017 1933 08/24/17 0550 08/25/17 0448 08/26/17 0420 08/26/17 1033  NA 140 141 149* 150* 153*  K 4.5 4.2 3.3* 3.0* 4.6  CL 115* 115* 111 111 114*  CO2 15* 16* 28 28 29   GLUCOSE 71 141* 146* 95 97  BUN 86* 74* 48* 38* 34*  CREATININE 3.01* 2.38* 1.41* 1.22* 1.20*  CALCIUM 7.9* 7.8* 8.1* 8.0* 7.9*  MG  --  1.3* 2.0  --   --   PHOS  --  4.4 3.0  --   --    GFR: Estimated Creatinine Clearance: 53.9 mL/min (A) (by C-G formula based on SCr of 1.2 mg/dL (H)). Liver Function Tests: Recent Labs  Lab 08/21/2017 1011 08/26/17 1033  AST 14* 33  ALT 9* 8*  ALKPHOS 81 76  BILITOT 0.4 2.0*  PROT 6.4* 6.2*  ALBUMIN 2.0* 1.9*   No results for input(s): LIPASE, AMYLASE in the last 168 hours. No results for input(s):  AMMONIA in the last 168 hours. Coagulation Profile: Recent Labs  Lab 08/12/2017 1011  INR 1.28   Cardiac Enzymes: Recent Labs  Lab 08/24/2017 1933 08/19/2017 2349  TROPONINI 0.26* 0.25*   BNP (last 3 results) No results for input(s): PROBNP in the last 8760 hours. HbA1C: Recent Labs    08/25/17 1252  HGBA1C 5.5   CBG: Recent Labs  Lab 08/25/17 1551 08/25/17 1958 08/25/17 2358 08/26/17 0754 08/26/17 1158  GLUCAP 115* 98 87 107* 120*   Lipid Profile: Recent Labs    08/25/17 1252  CHOL 92  HDL 27*  LDLCALC 40  TRIG 127  CHOLHDL 3.4   Thyroid Function Tests: No results for input(s): TSH, T4TOTAL, FREET4, T3FREE, THYROIDAB in the last 72 hours. Anemia Panel: No results for input(s): VITAMINB12, FOLATE, FERRITIN, TIBC, Delgado, RETICCTPCT in the last 72 hours. Sepsis Labs: Recent Labs  Lab 09/01/2017 1829 08/16/2017 2349 08/26/17 1033 08/26/17 1301  PROCALCITON  --   --  0.56  --   LATICACIDVEN 0.8 1.4 1.6 1.9    Recent Results (from the past 240 hour(s))  Urine culture     Status: Abnormal   Collection Time: 08/28/2017 11:53 AM  Result Value Ref Range Status   Specimen Description URINE, CATHETERIZED  Final   Special Requests   Final    NONE Performed at [facility], [place] [address], [place], [place] [address]    Culture >=100,000 COLONIES/mL ESCHERICHIA COLI (A)  Final   Report Status 08/25/2017 FINAL  Final   Organism ID, Bacteria ESCHERICHIA COLI (A)  Final      Susceptibility   Escherichia coli - MIC*    AMPICILLIN >=32 RESISTANT Resistant     CEFAZOLIN <=4 SENSITIVE Sensitive     CEFTRIAXONE <=1 SENSITIVE Sensitive     CIPROFLOXACIN >=4 RESISTANT Resistant     GENTAMICIN >=16 RESISTANT Resistant     IMIPENEM <=0.25 SENSITIVE Sensitive     NITROFURANTOIN <=16 SENSITIVE Sensitive     TRIMETH/SULFA <=20 SENSITIVE Sensitive     AMPICILLIN/SULBACTAM 16 INTERMEDIATE Intermediate     PIP/TAZO <=4 SENSITIVE Sensitive     Extended ESBL NEGATIVE Sensitive      * >=100,000 COLONIES/mL ESCHERICHIA COLI  MRSA PCR Screening     Status: Abnormal  Collection Time: 08/20/2017  3:40 PM  Result Value Ref Range Status   MRSA by PCR POSITIVE (A) NEGATIVE Final    Comment:        The GeneXpert MRSA Assay (FDA approved for NASAL specimens only), is one component of a comprehensive MRSA colonization surveillance program. It is not intended to diagnose MRSA infection nor to guide or monitor treatment for MRSA infections. RESULT CALLED TO, READ BACK BY AND VERIFIED WITHLarose Kells RN [id number] 08/22/2017 A BROWNING Performed at [facility], [place] [address], [place], [place] [address]   Culture, blood (Routine X 2) w Reflex to ID Panel     Status: None (Preliminary result)   Collection Time: 08/22/2017  7:06 PM  Result Value Ref Range Status   Specimen Description BLOOD SITE NOT SPECIFIED  Final   Special Requests   Final    BOTTLES DRAWN AEROBIC ONLY Blood Culture adequate volume   Culture   Final    NO GROWTH 3 DAYS Performed at [facility], [address], [place], [place] [address]    Report Status PENDING  Incomplete  Culture, blood (Routine X 2) w Reflex to ID Panel     Status: None (Preliminary result)   Collection Time: 09/01/2017 11:45 PM  Result Value Ref Range Status   Specimen Description BLOOD LEFT ANTECUBITAL  Final   Special Requests   Final    BOTTLES DRAWN AEROBIC ONLY Blood Culture adequate volume   Culture   Final    NO GROWTH 2 DAYS Performed at [facility], [place] [address], [place], [place] [address]    Report Status PENDING  Incomplete      Radiology Studies: Ct Angio Head W Or Wo Contrast  Result Date: 08/25/2017 CLINICAL DATA:  Initial evaluation for acute stroke, ataxia. EXAM: CT ANGIOGRAPHY HEAD AND NECK TECHNIQUE: Multidetector CT imaging of the head and neck was performed using the standard protocol during bolus administration of intravenous contrast. Multiplanar CT image reconstructions and MIPs were  obtained to evaluate the vascular anatomy. Carotid stenosis measurements (when applicable) are obtained utilizing NASCET criteria, using the distal internal carotid diameter as the denominator. CONTRAST:  27mL ISOVUE-370 IOPAMIDOL (ISOVUE-370) INJECTION 76% COMPARISON:  Prior CT from 08/03/2017 as well as previous MRI from 07/12/2017. FINDINGS: CT HEAD FINDINGS Brain: Atrophy with chronic small vessel ischemic disease again noted. Continued evolution of multiple subacute infarcts from previous brain MRI on 07/12/2017. Additional new more acute to early subacute ischemic infarct involving the lateral right temporal lobe, similar in size and distribution as compared to previous. No acute intracranial hemorrhage. Known intracranial metastases not well seen. No midline shift or significant mass effect. No hydrocephalus. No extra-axial fluid collection. Vascular: No hyperdense vessel. Scattered vascular calcifications noted within the carotid siphons. Skull: Scalp soft tissues and calvarium within normal limits. Sinuses: Small right maxillary sinus retention cyst. Paranasal sinuses are otherwise clear. Chronic bilateral mastoid effusions. Orbits: No acute abnormality about the globes and orbits. Review of the MIP images confirms the above findings CTA NECK FINDINGS Aortic arch: Visualized aortic arch of normal caliber with normal branch pattern. No flow-limiting stenosis about the origin of the great vessels. Visualized subclavian arteries widely patent. Right carotid system: Right common and internal carotid arteries patent without stenosis, dissection, or occlusion. No significant atheromatous narrowing about the right carotid bifurcation. Left carotid system: Left common and internal carotid arteries widely patent without stenosis, dissection, or occlusion. Minimal atheromatous plaque about the left bifurcation  without stenosis. Vertebral arteries: Both of the vertebral arteries arise from the subclavian arteries.  Left vertebral artery dominant. Vertebral arteries widely patent within the neck without stenosis, dissection, or occlusion. Skeleton: No acute osseous abnormality. No worrisome lytic or blastic osseous lesions. Other neck: No acute soft tissue abnormality within the neck. Mildly prominent 12 mm left level II lymph node. Additionally, scattered right supraclavicular nodes measure up to 9 mm, indeterminate. Subcentimeter hypodense right thyroid nodule, of doubtful significance. Salivary glands within normal limits. Upper chest: Left-sided central venous catheter partially visualized. Filling defects within the left main and proximal segmental pulmonary arteries, consistent with acute pulmonary emboli (series 9, image 171). Evidence for segmental and subsegmental right-sided emboli within the right upper lobe as well (series 9, image 173). Irregular enlarged bilateral pleural effusions, partially loculated on the left. Extensive bilateral airspace disease partially visualized right worse than left. Review of the MIP images confirms the above findings CTA HEAD FINDINGS Anterior circulation: Petrous segments widely patent bilaterally. Mild scattered atheromatous plaque within the cavernous ICAs without hemodynamically significant stenosis. A1 segments, anterior communicating artery common anterior cerebral arteries patent to their distal aspects without stenosis. No M1 stenosis or occlusion. Distal MCA branches well perfused and symmetric. Posterior circulation: Vertebral arteries patent to the vertebrobasilar junction without stenosis. Left vertebral artery dominant. Posterior inferior cerebral arteries patent bilaterally. Basilar artery widely patent to its distal aspect. Superior cerebellar and posterior cerebral arteries widely patent bilaterally without stenosis. Small right posterior communicating artery. Venous sinuses: Patent. Anatomic variants: None significant. Delayed phase: Delayed sequence markedly degraded  by motion artifact. No obvious abnormal enhancement. No new intracranial metastases not well seen. Review of the MIP images confirms the above findings IMPRESSION: 1. Acute pulmonary emboli involving the left greater than right pulmonary arteries as above, partially visualized. Further assessment with dedicated CTA of the chest could be performed for complete evaluation as clinically warranted. 2. Large bilateral pleural effusions with extensive parenchymal airspace disease, right greater than left, which could reflect edema, infection, or sequelae of patient's known malignancy. 3. Negative CTA for large vessel occlusion. No hemodynamically significant or correctable stenosis within the major arterial vasculature of the head and neck. 4. Evolving acute to early subacute right temporal infarct, similar relative to prior CT from 08/20/2017, but new relative to previous MRI from 07/12/2017. Additional subacute infarcts involving the bilateral cerebral hemispheres grossly similar to prior MRI. No significant mass effect or associated hemorrhage. 5. Known intracranial metastases not well seen on this exam. Critical Value/emergent results were called by telephone at the time of interpretation on 08/25/2017 at 9:58 pm to Dr. Tamala Julian, who verbally acknowledged these results. Electronically Signed   By: Jeannine Boga M.D.   On: 08/25/2017 22:05   Ct Angio Neck W Or Wo Contrast  Result Date: 08/25/2017 CLINICAL DATA:  Initial evaluation for acute stroke, ataxia. EXAM: CT ANGIOGRAPHY HEAD AND NECK TECHNIQUE: Multidetector CT imaging of the head and neck was performed using the standard protocol during bolus administration of intravenous contrast. Multiplanar CT image reconstructions and MIPs were obtained to evaluate the vascular anatomy. Carotid stenosis measurements (when applicable) are obtained utilizing NASCET criteria, using the distal internal carotid diameter as the denominator. CONTRAST:  72mL ISOVUE-370  IOPAMIDOL (ISOVUE-370) INJECTION 76% COMPARISON:  Prior CT from 08/03/2017 as well as previous MRI from 07/12/2017. FINDINGS: CT HEAD FINDINGS Brain: Atrophy with chronic small vessel ischemic disease again noted. Continued evolution of multiple subacute infarcts from previous brain MRI on 07/12/2017. Additional new more  acute to early subacute ischemic infarct involving the lateral right temporal lobe, similar in size and distribution as compared to previous. No acute intracranial hemorrhage. Known intracranial metastases not well seen. No midline shift or significant mass effect. No hydrocephalus. No extra-axial fluid collection. Vascular: No hyperdense vessel. Scattered vascular calcifications noted within the carotid siphons. Skull: Scalp soft tissues and calvarium within normal limits. Sinuses: Small right maxillary sinus retention cyst. Paranasal sinuses are otherwise clear. Chronic bilateral mastoid effusions. Orbits: No acute abnormality about the globes and orbits. Review of the MIP images confirms the above findings CTA NECK FINDINGS Aortic arch: Visualized aortic arch of normal caliber with normal branch pattern. No flow-limiting stenosis about the origin of the great vessels. Visualized subclavian arteries widely patent. Right carotid system: Right common and internal carotid arteries patent without stenosis, dissection, or occlusion. No significant atheromatous narrowing about the right carotid bifurcation. Left carotid system: Left common and internal carotid arteries widely patent without stenosis, dissection, or occlusion. Minimal atheromatous plaque about the left bifurcation without stenosis. Vertebral arteries: Both of the vertebral arteries arise from the subclavian arteries. Left vertebral artery dominant. Vertebral arteries widely patent within the neck without stenosis, dissection, or occlusion. Skeleton: No acute osseous abnormality. No worrisome lytic or blastic osseous lesions. Other neck:  No acute soft tissue abnormality within the neck. Mildly prominent 12 mm left level II lymph node. Additionally, scattered right supraclavicular nodes measure up to 9 mm, indeterminate. Subcentimeter hypodense right thyroid nodule, of doubtful significance. Salivary glands within normal limits. Upper chest: Left-sided central venous catheter partially visualized. Filling defects within the left main and proximal segmental pulmonary arteries, consistent with acute pulmonary emboli (series 9, image 171). Evidence for segmental and subsegmental right-sided emboli within the right upper lobe as well (series 9, image 173). Irregular enlarged bilateral pleural effusions, partially loculated on the left. Extensive bilateral airspace disease partially visualized right worse than left. Review of the MIP images confirms the above findings CTA HEAD FINDINGS Anterior circulation: Petrous segments widely patent bilaterally. Mild scattered atheromatous plaque within the cavernous ICAs without hemodynamically significant stenosis. A1 segments, anterior communicating artery common anterior cerebral arteries patent to their distal aspects without stenosis. No M1 stenosis or occlusion. Distal MCA branches well perfused and symmetric. Posterior circulation: Vertebral arteries patent to the vertebrobasilar junction without stenosis. Left vertebral artery dominant. Posterior inferior cerebral arteries patent bilaterally. Basilar artery widely patent to its distal aspect. Superior cerebellar and posterior cerebral arteries widely patent bilaterally without stenosis. Small right posterior communicating artery. Venous sinuses: Patent. Anatomic variants: None significant. Delayed phase: Delayed sequence markedly degraded by motion artifact. No obvious abnormal enhancement. No new intracranial metastases not well seen. Review of the MIP images confirms the above findings IMPRESSION: 1. Acute pulmonary emboli involving the left greater than  right pulmonary arteries as above, partially visualized. Further assessment with dedicated CTA of the chest could be performed for complete evaluation as clinically warranted. 2. Large bilateral pleural effusions with extensive parenchymal airspace disease, right greater than left, which could reflect edema, infection, or sequelae of patient's known malignancy. 3. Negative CTA for large vessel occlusion. No hemodynamically significant or correctable stenosis within the major arterial vasculature of the head and neck. 4. Evolving acute to early subacute right temporal infarct, similar relative to prior CT from 08/16/2017, but new relative to previous MRI from 07/12/2017. Additional subacute infarcts involving the bilateral cerebral hemispheres grossly similar to prior MRI. No significant mass effect or associated hemorrhage. 5. Known intracranial metastases not  well seen on this exam. Critical Value/emergent results were called by telephone at the time of interpretation on 08/25/2017 at 9:58 pm to Dr. Tamala Julian, who verbally acknowledged these results. Electronically Signed   By: Jeannine Boga M.D.   On: 08/25/2017 22:05   [place] Ekg Site Rite  Result Date: 08/26/2017 If Site Rite image not attached, placement could not be confirmed due to current cardiac rhythm.   Scheduled Meds: . amiodarone  200 mg Oral Daily  . aspirin  81 mg Oral Daily  . atorvastatin  40 mg Oral q1800  . chlorhexidine  15 mL Mouth Rinse BID  . Chlorhexidine Gluconate Cloth  6 each Topical Q0600  . haloperidol lactate  5 mg Intravenous Once  . mouth rinse  15 mL Mouth Rinse q12n4p  . mupirocin ointment  1 application Nasal BID  . pantoprazole (PROTONIX) IV  40 mg Intravenous Q24H  . sertraline  100 mg Oral Daily   Continuous Infusions: . sodium chloride Stopped (08/19/2017 1403)  . sodium chloride Stopped (08/25/17 2005)  .  ceFAZolin (ANCEF) IV Stopped (08/26/17 0545)  . dextrose 5 % with KCl 20 mEq / L 20 mEq (08/26/17 1117)     Time spent: >25 minutes  Deatra James, MD [facility],  Pager [phone]  If 7PM-7AM, please contact night-coverage [email or web address]   Password [facility]  08/26/2017, 2:07 PM

## 2017-08-26 NOTE — Progress Notes (Signed)
Rounding on patient and noticed patient had pulled Picc line out and taken oxygen and telemetry off. Placed patient oxygen back on saturations down 79 % gradually increased oxygen to 15 liters to get saturations 93 %.Patient placed back on telemetry  And green mitts placed to keep patient from removing other invasive lines.Will notify MD on call and continue to monitor patient.

## 2017-08-26 NOTE — Discharge Instructions (Signed)
Hypoxia Hypoxia is a condition that happens when there is a lack of oxygen in the body's tissues and organs. When there is not enough oxygen, organs cannot work as they should. This causes serious problems throughout the body and in the brain. What are the causes? This condition may be caused by:  Exposure to high altitude.  A collapsed lung (pneumothorax).  Lung infection (pneumonia).  Lung injury.  Long-term (chronic) lung disease, such as COPD (chronic obstructive pulmonary disease).  Blood collecting in the chest cavity (hemothorax).  Food, saliva, or vomit getting into the airway (aspiration).  Reduced blood flow (ischemia).  Severe blood loss.  Slow or shallow breathing (hypoventilation).  Blood disorders, such as anemia.  Carbon monoxide poisoning.  The heart suddenly stopping (cardiac arrest).  Anesthetic medicines.  Drowning.  Choking.  What are the signs or symptoms? Symptoms of this condition include:  Headache.  Fatigue.  Drowsiness.  Forgetfulness.  Nausea.  Confusion.  Shortness of breath.  Dizziness.  Bluish color of the skin, lips, or nail beds (cyanosis).  Change in consciousness or awareness.  If hypoxia is not treated, it can lead to convulsions, loss of consciousness (coma), or brain damage. How is this diagnosed? This condition may be diagnosed based on:  A physical exam.  Blood tests.  A test that measures how much oxygen is in your blood (pulse oximetry). This is done with a sensor that is placed on your finger, toe, or earlobe.  Chest X-ray.  Tests to check your lung function (pulmonary function tests).  A test to check the electrical activity of your heart (electrocardiogram, ECG).  You may have other tests to determine the cause of your hypoxia. How is this treated? Treatment for this condition depends on what is causing the hypoxia. You will likely be treated with oxygen therapy. This may be done by giving you  oxygen through a face mask or through tubes in your nose. Your health care provider may also recommend other therapies to treat the underlying cause of your hypoxia. Follow these instructions at home:  Take over-the-counter and prescription medicines only as told by your health care provider.  Do not use any products that contain nicotine or tobacco, such as cigarettes and e-cigarettes. If you need help quitting, ask your health care provider.  Avoid secondhand smoke.  Work with your health care provider to manage any chronic conditions you have that may be causing hypoxia, such as COPD.  Keep all follow-up visits as told by your health care provider. This is important. Contact a health care provider if:  You have a fever.  You have trouble breathing, even after treatment.  You become extremely short of breath when you exercise. Get help right away if:  Your shortness of breath gets worse, especially with normal or very little activity.  Your skin, lips, or nail beds have a bluish color.  You become confused or you cannot think properly.  You have chest pain. Summary  Hypoxia is a condition that happens when there is a lack of oxygen in the body's tissues and organs.  If hypoxia is not treated, it can lead to convulsions, loss of consciousness (coma), or brain damage.  Symptoms of hypoxia can include a headache, shortness of breath, confusion, nausea, and a bluish skin color.  Hypoxia has many possible causes, including exposure to high altitude, carbon monoxide poisoning, or other health issues, such as blood disorders or cardiac arrest.  Hypoxia is usually treated with oxygen therapy. This information   is not intended to replace advice given to you by your health care provider. Make sure you discuss any questions you have with your health care provider. Document Released: 05/09/2016 Document Revised: 05/09/2016 Document Reviewed: 05/09/2016 [employer] Interactive Patient  Education  2018 [employer]  

## 2017-08-26 NOTE — Progress Notes (Signed)
Patient has pulled oxygen off twice more this shift and oxygen saturations have dropped as low as 58 %. While oxygen in place at 10 liters high flow nasal able to maintain saturations between 90-94 %.Dr. Tamala Julian notified. New orders received and carried out.Will continue to monitor patient.

## 2017-08-26 NOTE — Progress Notes (Signed)
No documented urine output in 24 hours; bladder scan performed which showed a volume of 650 cc urine; patient had a Foley catheter removed yesterday; per MD, place a new Foley catheter.

## 2017-08-26 NOTE — Significant Event (Addendum)
Rapid Response Event Note  Overview: Respiratory - Hypoxia   Initial Focused Assessment: Patient was recently transferred from the MICU. RN called and informed me that patient oxygen saturations had dropped into the 70s, patient had pulled her oxygen off and was pulling her at her lines.  RNs had transitioned patient to NRB 15L prior to my arrival. I spoke with the RT and asked that we transition the patient to HFNC. RT and I came and saw the patient, patient was alert, able to follow some commands, nods to questions, is not in acute respiratory distress, no increased WOB. Lung sounds rhonchi throughout R worse than L. + Acute PEs and extensive lung parenchymal disease along with NSCLC with mets to brain, liver, lung, and possibly spleen.   Interventions: -- Transition patient to HFNC to maintain sats > 90% -- Patient was placed on HFNC 10L and sats were 90-94% -- I called [facility] and updated the PCCM MD at 255 on the status of the patient. -- 0620, RN updated me that patient keeps removing her oxygen, becomes air hungry and desaturates. I asked the RN to speak PCCM MD in [facility] about patient's behavior. MD ordered Haldol.   Plan of Care: -- Should patient require more oxygen or show signs of respiratory distress/failure, will transfer back to ICU  Event Summary:   at    Call Time 0247 Arrival Time 0250 End Time 0315  Daisee Centner R

## 2017-08-26 NOTE — Progress Notes (Signed)
Spoke with Charge Nurse re PICC not to be today.  States pt just pulled out PIV so now needs PIV x2.

## 2017-08-26 NOTE — Progress Notes (Addendum)
PULMONARY / CRITICAL CARE MEDICINE   Name: Jennifer Delgado MRN: [id number] DOB: 09-14-1950    ADMISSION DATE:  08/24/2017  REFERRING MD:  Vanita Panda - EDP   CHIEF COMPLAINT:  AMS   HISTORY OF PRESENT ILLNESS:  Pt is encephelopathic; therefore, this HPI is obtained from chart review. Jennifer Delgado is a 67yo female with hx metastatic NCSLCA (malignant effusion, mets to brain, liver, lung, possible spleen) with several recent admissions beginning 3/28-4/19 with hyponatremia, PNA, STEMI, VFib arrest and again 4/22-4/30 with hypovolemic v septic shock.  She was discharged to [facility]. Was brought to [facility] ED 5/22 after being found by staff at [facility] with [place], dysphasia.  Apparently pt normally able to talk but on day of presentation was only tracking and nodding.  CT of the head was concerning for acute infarction over right frontotemporal lobe.  Also found to have hypotension (SBP in 70s and 80s), SCr 4.03, K 5.4, WBC 16.8, Hgb 6.9, FOBT positive.  2u PRBC were ordered by EDP.  She was started on IVF resuscitation.  PCCM was called for admission to ICU.  Neurology has been consulted for concerns of CVA.  08/25/2017>>CT scan is positive for acute PE involving the left greater than right pulmonary arteries as above, partially visualized.   08/26/2017>>Preliminary Doppler Studies: Positive for DVT involving right CFV, FV and popliteal vein and PTV, Peroneal veins, left CFV, SFJ, GSV and left PTV and peroneal veins partially thrombosed .    SUBJECTIVE:  Confused, responds to call of name, can tell you her name is Jennifer Delgado. She remains on HFNC at 15 L with sats in the low 90's. She is agitated at intervals. She has pulled out her only IV access. Haldol started IM for now. No PICC placement until tomorrow due to staffing issues.Attempting to start PIV for heparin infusion.  VITAL SIGNS: BP (!) 158/117 (BP Location: Right Arm)   Pulse (!) 119   Temp 97.6 F (36.4 C) (Axillary)   Resp (!)  30   Ht 5\' 4"  (1.626 m)   Wt 222 lb 0.1 oz (100.7 kg)   SpO2 (!) 89%   BMI 38.11 kg/m   HEMODYNAMICS:    VENTILATOR SETTINGS:    INTAKE / OUTPUT: I/O last 3 completed shifts: In: 1865 [I.V.:1615; IV Piggyback:250] Out: 1000 [Urine:1000]  PHYSICAL EXAMINATION: General: Chronically ill appearing female, agitated and confused, requiring HFNC Neuro: Awake and agitated, can state her name, follows simple commands at times. MAE's x 4, weak HEENT: Jennifer Delgado. EOMI, sclerae anicteric, thick neck Cardiovascular: RRR, no M/R/G.  Lungs: Respirations even and slight tachypnea .Bilateral excursion, coarse throughout and diminished per bases. Abdomen: Obese, BS x 4, soft, NT/ND.  Musculoskeletal: No obvious  deformities, + 1-2 + BLE edema R>L edema.  Skin: Intact, warm, no rashes, lesions noted   LABS:  BMET Recent Labs  Lab 08/25/17 0448 08/26/17 0420 08/26/17 1033  NA 149* 150* 153*  K 3.3* 3.0* 4.6  CL 111 111 114*  CO2 28 28 29   BUN 48* 38* 34*  CREATININE 1.41* 1.22* 1.20*  GLUCOSE 146* 95 97    Electrolytes Recent Labs  Lab 08/24/17 0550 08/25/17 0448 08/26/17 0420 08/26/17 1033  CALCIUM 7.8* 8.1* 8.0* 7.9*  MG 1.3* 2.0  --   --   PHOS 4.4 3.0  --   --     CBC Recent Labs  Lab 08/10/2017 1011  08/24/17 0324 08/24/17 1257 08/24/17 1546 08/24/17 2258  WBC 16.8*  --  23.0*  --   --   --  HGB 6.9*   < > 8.6* 8.4* 8.5* 8.7*  HCT 23.2*   < > 28.1* 26.9* 27.4* 26.9*  PLT 175  --  142*  --   --   --    < > = values in this interval not displayed.    Coag's Recent Labs  Lab 08/10/2017 1011  INR 1.28    Sepsis Markers Recent Labs  Lab 08/26/2017 2349 08/26/17 1033 08/26/17 1301  LATICACIDVEN 1.4 1.6 1.9  PROCALCITON  --  0.56  --     ABG Recent Labs  Lab 08/24/17 0508  PHART 7.174*  PCO2ART 35.5  PO2ART 74.0*    Liver Enzymes Recent Labs  Lab 08/29/2017 1011 08/26/17 1033  AST 14* 33  ALT 9* 8*  ALKPHOS 81 76  BILITOT 0.4 2.0*  ALBUMIN  2.0* 1.9*    Cardiac Enzymes Recent Labs  Lab 08/11/2017 1933 08/11/2017 2349  TROPONINI 0.26* 0.25*    Glucose Recent Labs  Lab 08/25/17 1130 08/25/17 1551 08/25/17 1958 08/25/17 2358 08/26/17 0754 08/26/17 1158  GLUCAP 130* 115* 98 87 107* 120*    Imaging Ct Angio Head W Or Wo Contrast  Result Date: 08/25/2017 CLINICAL DATA:  Initial evaluation for acute stroke, ataxia. EXAM: CT ANGIOGRAPHY HEAD AND NECK TECHNIQUE: Multidetector CT imaging of the head and neck was performed using the standard protocol during bolus administration of intravenous contrast. Multiplanar CT image reconstructions and MIPs were obtained to evaluate the vascular anatomy. Carotid stenosis measurements (when applicable) are obtained utilizing NASCET criteria, using the distal internal carotid diameter as the denominator. CONTRAST:  62mL ISOVUE-370 IOPAMIDOL (ISOVUE-370) INJECTION 76% COMPARISON:  Prior CT from 08/25/2017 as well as previous MRI from 07/12/2017. FINDINGS: CT HEAD FINDINGS Brain: Atrophy with chronic small vessel ischemic disease again noted. Continued evolution of multiple subacute infarcts from previous brain MRI on 07/12/2017. Additional new more acute to early subacute ischemic infarct involving the lateral right temporal lobe, similar in size and distribution as compared to previous. No acute intracranial hemorrhage. Known intracranial metastases not well seen. No midline shift or significant mass effect. No hydrocephalus. No extra-axial fluid collection. Vascular: No hyperdense vessel. Scattered vascular calcifications noted within the carotid siphons. Skull: Scalp soft tissues and calvarium within normal limits. Sinuses: Small right maxillary sinus retention cyst. Paranasal sinuses are otherwise clear. Chronic bilateral mastoid effusions. Orbits: No acute abnormality about the globes and orbits. Review of the MIP images confirms the above findings CTA NECK FINDINGS Aortic arch: Visualized aortic  arch of normal caliber with normal branch pattern. No flow-limiting stenosis about the origin of the great vessels. Visualized subclavian arteries widely patent. Right carotid system: Right common and internal carotid arteries patent without stenosis, dissection, or occlusion. No significant atheromatous narrowing about the right carotid bifurcation. Left carotid system: Left common and internal carotid arteries widely patent without stenosis, dissection, or occlusion. Minimal atheromatous plaque about the left bifurcation without stenosis. Vertebral arteries: Both of the vertebral arteries arise from the subclavian arteries. Left vertebral artery dominant. Vertebral arteries widely patent within the neck without stenosis, dissection, or occlusion. Skeleton: No acute osseous abnormality. No worrisome lytic or blastic osseous lesions. Other neck: No acute soft tissue abnormality within the neck. Mildly prominent 12 mm left level II lymph node. Additionally, scattered right supraclavicular nodes measure up to 9 mm, indeterminate. Subcentimeter hypodense right thyroid nodule, of doubtful significance. Salivary glands within normal limits. Upper chest: Left-sided central venous catheter partially visualized. Filling defects within the left main and proximal segmental  pulmonary arteries, consistent with acute pulmonary emboli (series 9, image 171). Evidence for segmental and subsegmental right-sided emboli within the right upper lobe as well (series 9, image 173). Irregular enlarged bilateral pleural effusions, partially loculated on the left. Extensive bilateral airspace disease partially visualized right worse than left. Review of the MIP images confirms the above findings CTA HEAD FINDINGS Anterior circulation: Petrous segments widely patent bilaterally. Mild scattered atheromatous plaque within the cavernous ICAs without hemodynamically significant stenosis. A1 segments, anterior communicating artery common anterior  cerebral arteries patent to their distal aspects without stenosis. No M1 stenosis or occlusion. Distal MCA branches well perfused and symmetric. Posterior circulation: Vertebral arteries patent to the vertebrobasilar junction without stenosis. Left vertebral artery dominant. Posterior inferior cerebral arteries patent bilaterally. Basilar artery widely patent to its distal aspect. Superior cerebellar and posterior cerebral arteries widely patent bilaterally without stenosis. Small right posterior communicating artery. Venous sinuses: Patent. Anatomic variants: None significant. Delayed phase: Delayed sequence markedly degraded by motion artifact. No obvious abnormal enhancement. No new intracranial metastases not well seen. Review of the MIP images confirms the above findings IMPRESSION: 1. Acute pulmonary emboli involving the left greater than right pulmonary arteries as above, partially visualized. Further assessment with dedicated CTA of the chest could be performed for complete evaluation as clinically warranted. 2. Large bilateral pleural effusions with extensive parenchymal airspace disease, right greater than left, which could reflect edema, infection, or sequelae of patient's known malignancy. 3. Negative CTA for large vessel occlusion. No hemodynamically significant or correctable stenosis within the major arterial vasculature of the head and neck. 4. Evolving acute to early subacute right temporal infarct, similar relative to prior CT from 08/13/2017, but new relative to previous MRI from 07/12/2017. Additional subacute infarcts involving the bilateral cerebral hemispheres grossly similar to prior MRI. No significant mass effect or associated hemorrhage. 5. Known intracranial metastases not well seen on this exam. Critical Value/emergent results were called by telephone at the time of interpretation on 08/25/2017 at 9:58 pm to Dr. Tamala Julian, who verbally acknowledged these results. Electronically Signed   By:  Jeannine Boga M.D.   On: 08/25/2017 22:05   Ct Angio Neck W Or Wo Contrast  Result Date: 08/25/2017 CLINICAL DATA:  Initial evaluation for acute stroke, ataxia. EXAM: CT ANGIOGRAPHY HEAD AND NECK TECHNIQUE: Multidetector CT imaging of the head and neck was performed using the standard protocol during bolus administration of intravenous contrast. Multiplanar CT image reconstructions and MIPs were obtained to evaluate the vascular anatomy. Carotid stenosis measurements (when applicable) are obtained utilizing NASCET criteria, using the distal internal carotid diameter as the denominator. CONTRAST:  44mL ISOVUE-370 IOPAMIDOL (ISOVUE-370) INJECTION 76% COMPARISON:  Prior CT from 08/10/2017 as well as previous MRI from 07/12/2017. FINDINGS: CT HEAD FINDINGS Brain: Atrophy with chronic small vessel ischemic disease again noted. Continued evolution of multiple subacute infarcts from previous brain MRI on 07/12/2017. Additional new more acute to early subacute ischemic infarct involving the lateral right temporal lobe, similar in size and distribution as compared to previous. No acute intracranial hemorrhage. Known intracranial metastases not well seen. No midline shift or significant mass effect. No hydrocephalus. No extra-axial fluid collection. Vascular: No hyperdense vessel. Scattered vascular calcifications noted within the carotid siphons. Skull: Scalp soft tissues and calvarium within normal limits. Sinuses: Small right maxillary sinus retention cyst. Paranasal sinuses are otherwise clear. Chronic bilateral mastoid effusions. Orbits: No acute abnormality about the globes and orbits. Review of the MIP images confirms the above findings CTA NECK FINDINGS Aortic  arch: Visualized aortic arch of normal caliber with normal branch pattern. No flow-limiting stenosis about the origin of the great vessels. Visualized subclavian arteries widely patent. Right carotid system: Right common and internal carotid arteries  patent without stenosis, dissection, or occlusion. No significant atheromatous narrowing about the right carotid bifurcation. Left carotid system: Left common and internal carotid arteries widely patent without stenosis, dissection, or occlusion. Minimal atheromatous plaque about the left bifurcation without stenosis. Vertebral arteries: Both of the vertebral arteries arise from the subclavian arteries. Left vertebral artery dominant. Vertebral arteries widely patent within the neck without stenosis, dissection, or occlusion. Skeleton: No acute osseous abnormality. No worrisome lytic or blastic osseous lesions. Other neck: No acute soft tissue abnormality within the neck. Mildly prominent 12 mm left level II lymph node. Additionally, scattered right supraclavicular nodes measure up to 9 mm, indeterminate. Subcentimeter hypodense right thyroid nodule, of doubtful significance. Salivary glands within normal limits. Upper chest: Left-sided central venous catheter partially visualized. Filling defects within the left main and proximal segmental pulmonary arteries, consistent with acute pulmonary emboli (series 9, image 171). Evidence for segmental and subsegmental right-sided emboli within the right upper lobe as well (series 9, image 173). Irregular enlarged bilateral pleural effusions, partially loculated on the left. Extensive bilateral airspace disease partially visualized right worse than left. Review of the MIP images confirms the above findings CTA HEAD FINDINGS Anterior circulation: Petrous segments widely patent bilaterally. Mild scattered atheromatous plaque within the cavernous ICAs without hemodynamically significant stenosis. A1 segments, anterior communicating artery common anterior cerebral arteries patent to their distal aspects without stenosis. No M1 stenosis or occlusion. Distal MCA branches well perfused and symmetric. Posterior circulation: Vertebral arteries patent to the vertebrobasilar junction  without stenosis. Left vertebral artery dominant. Posterior inferior cerebral arteries patent bilaterally. Basilar artery widely patent to its distal aspect. Superior cerebellar and posterior cerebral arteries widely patent bilaterally without stenosis. Small right posterior communicating artery. Venous sinuses: Patent. Anatomic variants: None significant. Delayed phase: Delayed sequence markedly degraded by motion artifact. No obvious abnormal enhancement. No new intracranial metastases not well seen. Review of the MIP images confirms the above findings IMPRESSION: 1. Acute pulmonary emboli involving the left greater than right pulmonary arteries as above, partially visualized. Further assessment with dedicated CTA of the chest could be performed for complete evaluation as clinically warranted. 2. Large bilateral pleural effusions with extensive parenchymal airspace disease, right greater than left, which could reflect edema, infection, or sequelae of patient's known malignancy. 3. Negative CTA for large vessel occlusion. No hemodynamically significant or correctable stenosis within the major arterial vasculature of the head and neck. 4. Evolving acute to early subacute right temporal infarct, similar relative to prior CT from 08/08/2017, but new relative to previous MRI from 07/12/2017. Additional subacute infarcts involving the bilateral cerebral hemispheres grossly similar to prior MRI. No significant mass effect or associated hemorrhage. 5. Known intracranial metastases not well seen on this exam. Critical Value/emergent results were called by telephone at the time of interpretation on 08/25/2017 at 9:58 pm to Dr. Tamala Julian, who verbally acknowledged these results. Electronically Signed   By: Jeannine Boga M.D.   On: 08/25/2017 22:05   [place] Ekg Site Rite  Result Date: 08/26/2017 If Site Rite image not attached, placement could not be confirmed due to current cardiac rhythm.    STUDIES:  CT head 5/22 >  concerning for acute infarct over right frontotemporal lobe. MRI brain 5/22 >  Echo 5/23 >  Echo 4/1 > EF 60-65%, G1DD.  CULTURES: Blood 5/22 >  Urine 5/22 > GNR's > E coli Sputum 5/22 >   ANTIBIOTICS: Vanc 5/22 > 5/24 Zosyn 5/22 > 5/24  Ancef 5/24 >>  SIGNIFICANT EVENTS: 5/22 > admit. 5/24>> PE per CT 5/25>> DVT per Venous dopplers  LINES/TUBES: PIV x 2  DISCUSSION: 67 y.o. F with hx metastatic NSCLC and recent V.fib arrest, PNA, admitted 5/22 with AMS, hypotension, GI bleed.   5/24 CT confirms new acute PE., Dopplers confirm  + DVT, worsening respiratory status. Readmitted to ICU 5/25  ASSESSMENT / PLAN:  PULMONARY A: Acute hypoxic respiratory failure - currently protecting airway but certainly at risk for decompensation / requiring intubation. Hx NSCLC with widespread mets to brain, liver, lung, possible spleen. + for Pulmonary Embolism per CT angio neck >>involving the left greater than right pulmonary arteries as above, partially visualized. Echo 08/24/2017 consistent with RH strain Large Bilateral pleural effusions R>L, etiology could be  edema, infection, or sequelae of patient's known malignancy.  P:   Heparin per pharmacy for PE treatment Titrate supplemental HF oxygen  as needed to maintain SpO2 > 92%. ABG prn Continue ABX as above Consider Lasix for management of effusions Mobilize as able Pulmonary toilet as able ( Difficult with AMS)   CARDIOVASCULAR A:  Suspected R heart strain 2/2 PE ( Confirmed by CT 08/25/2017)   per echo 08/24/2017 Shock - suspect multifactorial, septic (sacral wounds, UTI), hypovolemic, hemorrhagic (very unlikely). >> Resolved now hemodynamically stable Hx V.fib arrest, NSTEMI, HTN.>> resolved Lactic Acid 1.9 up from 1.6 earlier 5/25 P:  Telemetry EKG prn MAP goal > 65 mm Hg Heparin per pharmacy for PE treatment Hold preadmission amiodarone, imdur, lisinopril, lopressor. Continue amiodarone and lipitor  RENAL A:   Mild  hyperkalemia - resolved. Hypernatremia AKI -  making urine.  + 1400 cc since admission  P:   Monitor and replete electrolytes as needed Strict I/O's. Trend  BMP. D5W ( No other way to give free water)  GASTROINTESTINAL A:   FOBT positive - no evidence for GI bleeding. Nutrition>> None x 3 days. Hx NSCLC with widespread mets to brain, liver, lung, possible spleen. Hx dysphagia and vocal cord dysfunction following prior intubation - on nectar thick liquids per SLP recs on prior admit. P:   GI consulted, appreciate recs to continue PPI for now. No plans for EGD unless evidence of bleeding. NPO. Continue home protonix MBS once stabilized. Will need to consider placement of Cortrack and starting TF.  HEMATOLOGIC / ONCOLOGIC A:   Anemia - chronic but now worse.  FOBT positive.  Improved after 2u PRBC's. Heparin  for Acute PE with hypoxia in patient with HX of GI bleed and embolic stroke Hx NSCLC with widespread mets to brain, liver, lung, possible spleen. P:  Will need Heparin for TX PE Trend  CBC. Monitor closely  for bleeding while on heparin F/u with oncology.  INFECTIOUS A:   T max 99 Leukocytosis ( WBC 23,000) CXR with effusion E coli UTI>> Ancef x 2 weeks P:   Continue empiric abx Ancef Follow cultures. Trend fever/ WBC Re-culture as is clinically indicated   ENDOCRINE A: Hyperglycemia  At risk of hypoglycemia 2/2 NPO status P:   D5W at 50 cc /hr CBG Q 4  NEUROLOGIC A:   CT brain + for right frontotemporal infarct,Evolving acute to early subacute right temporal infarct, similar relative to prior CT from 08/22/2017, but new relative to previous MRI from 07/12/2017.   AMS - multifactorial. Hx NSCLC  with widespread mets to brain, liver, lung, possible spleen. P:   Neurology consulted, appreciate recs. Limit sedating meds. Needs goals of care discussion as her condition appears to be declining over recent months.  Would favor DNR Hold preadmission  gabapentin, sertraline.  FAMILY  - Updates: No family available.  Attempted to call spouse Waunita Schooner [facility]) but no answer.  Needs goals of care discussion as her condition appears to be declining over recent months, and now she has acute PE/ DVT, effusions and worsening respiratory failure.  Would favor DNR, Will need further discussions with husband to address goals of care to allow [place] to align plan of care appropriately.Palliative care has been consulted. - Inter-disciplinary family meet or Palliative Care meeting due by:  08/29/17.  CC time: 45 min.   Eric Form, AGACNP-BC [facility] Pager:  [phone] 08/26/2017, 2:19 PM

## 2017-08-26 NOTE — Progress Notes (Signed)
STROKE TEAM PROGRESS NOTE   INTERVAL HISTORY No family at bedside. Patient with pulmonary embolus, discussed with attending, agree starting Heparin today. There is a risk of secondary hemorrhage into stroke but given her new clots feel benefits outweigh risks.  Vitals:   08/26/17 0900 08/26/17 0927 08/26/17 1414 08/26/17 1516  BP: 120/66 137/80 (!) 158/117   Pulse: 86 95 (!) 119   Resp: 17 18 (!) 30   Temp:   97.6 F (36.4 C)   TempSrc:   Axillary   SpO2: 100% 97% (!) 89% 99%  Weight:      Height:        CBC:  Recent Labs  Lab 08/13/2017 1011  08/24/17 0324  08/24/17 1546 08/24/17 2258  WBC 16.8*  --  23.0*  --   --   --   NEUTROABS 14.2*  --  20.8*  --   --   --   HGB 6.9*   < > 8.6*   < > 8.5* 8.7*  HCT 23.2*   < > 28.1*   < > 27.4* 26.9*  MCV 87.9  --  91.2  --   --   --   PLT 175  --  142*  --   --   --    < > = values in this interval not displayed.    Basic Metabolic Panel:  Recent Labs  Lab 08/24/17 0550 08/25/17 0448 08/26/17 0420 08/26/17 1033  NA 141 149* 150* 153*  K 4.2 3.3* 3.0* 4.6  CL 115* 111 111 114*  CO2 16* 28 28 29   GLUCOSE 141* 146* 95 97  BUN 74* 48* 38* 34*  CREATININE 2.38* 1.41* 1.22* 1.20*  CALCIUM 7.8* 8.1* 8.0* 7.9*  MG 1.3* 2.0  --   --   PHOS 4.4 3.0  --   --    Lipid Panel:     Component Value Date/Time   CHOL 92 08/25/2017 1252   TRIG 127 08/25/2017 1252   HDL 27 (L) 08/25/2017 1252   CHOLHDL 3.4 08/25/2017 1252   VLDL 25 08/25/2017 1252   LDLCALC 40 08/25/2017 1252   HgbA1c:  Lab Results  Component Value Date   HGBA1C 5.5 08/25/2017   Urine Drug Screen: No results found for: LABOPIA, COCAINSCRNUR, LABBENZ, AMPHETMU, THCU, LABBARB  Alcohol Level No results found for: ETH  IMAGING  Ct Angio Head W Or Wo Contrast Ct Angio Neck W Or Wo Contrast 08/25/2017 IMPRESSION:  1. Acute pulmonary emboli involving the left greater than right pulmonary arteries as above, partially visualized. Further assessment with dedicated  CTA of the chest could be performed for complete evaluation as clinically warranted.  2. Large bilateral pleural effusions with extensive parenchymal airspace disease, right greater than left, which could reflect edema, infection, or sequelae of patient's known malignancy.  3. Negative CTA for large vessel occlusion. No hemodynamically significant or correctable stenosis within the major arterial vasculature of the head and neck.  4. Evolving acute to early subacute right temporal infarct, similar relative to prior CT from 08/25/2017, but new relative to previous MRI from 07/12/2017. Additional subacute infarcts involving the bilateral cerebral hemispheres grossly similar to prior MRI. No significant mass effect or associated hemorrhage.  5. Known intracranial metastases not well seen on this exam.    [place] Ekg Site Rite 08/26/2017 If Site Rite image not attached, placement could not be confirmed due to current cardiac rhythm.   2D Echocardiogram  - Left ventricle: The cavity size was normal.  Systolic function was normal. The estimated ejection fraction was in the range of 60% to 65%. Wall motion was normal; there were no regional wall motion abnormalities. There was an increased relative contribution of atrial contraction to ventricular filling. Doppler parameters are consistent with abnormal left ventricular relaxation (grade 1 diastolic dysfunction). Doppler parameters are consistent with high ventricular filling pressure. - Ventricular septum: D shaped Ventricular septum consistent with pressure and volume overload of the RV. - Aortic valve: Valve area (VTI): 2.13 cm^2. Valve area (Vmax): 2.29 cm^2. Valve area (Vmean): 2.42 cm^2. - Mitral valve: There was trivial regurgitation. Valve area by pressure half-time: 1.32 cm^2. - Right ventricle: The cavity size was moderately dilated. Wall thickness was normal. Systolic function was moderately to severely reduced. - Pulmonic valve: There was mild  regurgitation. - Pulmonary arteries: PA peak pressure: 58 mm Hg (S). - Pericardium, extracardiac: A possible, trivial, free-flowing pericardial effusion was identified along the right ventricular free wall. There was a large left pleural effusion. Impressions:    The right ventricular systolic pressure was increased consistent with moderate pulmonary hypertension.   PHYSICAL EXAM Vitals:   08/26/17 0900 08/26/17 0927 08/26/17 1414 08/26/17 1516  BP: 120/66 137/80 (!) 158/117   Pulse: 86 95 (!) 119   Resp: 17 18 (!) 30   Temp:   97.6 F (36.4 C)   TempSrc:   Axillary   SpO2: 100% 97% (!) 89% 99%  Weight:      Height:          UNABLE TO EXAMINE TODAY, new PE and attending and team at bedside in procedure. Below is prior exam   General - ill appearing obese caucasian female  HEENT-  Normocephalic, no lesions, without obvious abnormality.  Normal external eye and conjunctiva.   Lungs-  no excessive working breathing.  Saturations within normal limits, patient in 2L [place] Extremities- Warm, dry and intact Musculoskeletal-no joint tenderness, deformity or swelling Skin-warm and dry, left leg area of redness covered by dressing.  Neurological Examination Mental Status: Awake. Confused. Oriented to person, not to time or place. Can repeat phrases, perseverates. Can name objects. Able to follow some simple commands such as open and close your eyes, raise your arms Cranial Nerves: II: UTA III,IV, VI: EOMI, PERRL V,VII:  facial light touch sensation normal bilaterally VIII: hearing normal bilaterally IX,X: UTA XI: bilateral shoulder shrug XII: midline tongue extension Motor: Right :  Upper extremity   4/5                                      Left:     Upper extremity   4/5             Lower extremity   4/5                                                  Lower extremity   4/5 Tone and bulk:normal tone throughout; no atrophy noted Sensory: intact bilaterally. Purposeful movement other  extremities. Deep Tendon Reflexes: 1+ and symmetric throughout Plantars: Right: downgoing                                Left: downgoing Cerebellar: UTA Gait:  UTA   ASSESSMENT/PLAN Jennifer Delgado is a 67 y.o. female with history of metastatic NCSLCA (malignanteffusion, mets to brain, liver, lung, possible spleen), HTN, obesity, chronic respiratory failure. Notably patient was recently admitted to [facility] 4/22-4/30 for shock (hypovolemic vs septic), d/c'd to SNF. Found at SNF with AMS and dysphasia, hypotensive in ED. CT showed a subacute R temporal lobe infarct. Unable to determine LKW, so not a tPA candidate.  Patient with pulmonary embolus, discussed with attending, agree starting Heparin today. There is a risk of secondary hemorrhage into stroke but given her new clots feel benefits outweigh risks.  Stroke:  Likely acute right frontotemporal infarct embolic secondary to hypercoagulable disorder due to lung cancer, left brain infarct subacute r/t previous MI  CT head right frontotemporal lobe concerning for acute infarct   2D Echo  EF 60-65%. No source of embolus. Pulm HTN  MRI w/ & w/o to look for new strokes, mets (previous MRI showed L brain stroke seen on CT but it did not show R brain stroke)  currently no order  CTA H&N - acute pulmonary emboli - evolving Rt temporal infarct - no large vessel occlusion.  LDL - 40  HgbA1c - 5.5  SCDs ordered for VTE prophylaxis, not in place given leg wounds  NPO  aspirin 81 mg daily prior to admission, now on No antithrombotic d/t GIB, anemia.   Therapy recommendations:  PT and OT recommended once pt stable  Disposition:  pending (from SNF)  Hypertension  Variable 65/42 - 131/57 . From stroke standpoint, permissive hypertension (OK if < 220/120) but gradually normalize in 5-7 days . Long-term BP goal normotensive  Hyperlipidemia  Home meds:  lipitor 40  Not resumed in hospital d/t NPO, critical status  LDL - 40, goal <  70  Resume statin at d/c  Other Stroke Risk Factors  Advanced age  Obesity, Body mass index is 38.11 kg/m., recommend weight loss, diet and exercise as appropriate   Coronary artery disease - hx V Fib arrest, NSTEMI  Other Active Problems  NSCLC  with widespread mets to brain, liver, lung, possible spleen  Acute hypoxic respiratory failure  Shock - multifactorial (sacral wounds, UTI), hypovolemic   AKI 74/2.38 -> 34/1.20  Hypomagnesemia - 1.3 -> 2.0  FOBT positive  Dysphagia and vocal cord dysfunction following prior intubation - on nectar thick liquids per SLP recs on prior admit.  Anemia - chronic but now worse.  FOBT positive.  Improved after 2u PRBC's. 8.7/26.9  Probable AI  Leukocytosis, WBC 23 (afebrile)  Hospital day # 3   ATTENDING NOTE:   Patient with pulmonary embolus, discussed with attending, agree starting Heparin today. There is a risk of secondary hemorrhage into stroke but given her new clots feel benefits outweigh risks.  Sarina Ill Stroke Neurology  This patient is critically ill and at significant risk of neurological worsening, death and care requires constant monitoring of vital signs, hemodynamics,respiratory and cardiac monitoring,review of multiple databases, neurological assessment, discussion with family, other specialists and medical decision making of high complexity.I  I spent 30 minutes of neurocritical care time in the care of this patient.  Sarina Ill, MD [facility]   To contact Stroke Continuity provider, please refer to [email or web address] After hours, contact General Neurology

## 2017-08-26 NOTE — Progress Notes (Signed)
I spoke with patient's husband and son. I explained that she has PE and DVT, and that we need to make decisions about how aggressive we want to be with treatment considering her other co-morbidities and her shortness of breath. We discussed that she is now on high flow oxygen, but may need intubation if the lasix we are planning to give does not help with oxygenation.He said he is trying to follow his wife's wishes to do everything. We discussed intubation with the possibility of trach and PEG and long term care, and he and his son both agreed that they do not want the patient intubated, but they do want everything else done including CPR and ACLS medications. We will make the patient a Do Not Intubate, but per her husband and son's  wishes we will continue to be aggressive otherwise for now. I explained that if her condition worsens, we may need to realign goals  of care. Both the husband and son verbalized understanding.   Magdalen Spatz, AGACNP-BC [place] [facility] / CCM 08/26/2017 5:29 PM

## 2017-08-26 NOTE — Progress Notes (Addendum)
Preliminary notes--Bilateral lower extremities venous duplex exam completed. Positive for DVT involving right CFV, FV and popliteal vein and PTV, Peroneal veins, left CFV, SFJ, GSV and left PTV and peroneal veins partially thrombosed .  IVC and right extrernal iliac vein are patent. Result notified RN on bedside.   Hongying Kele Barthelemy (RDMS RVT) 08/26/17 11:30 AM

## 2017-08-27 ENCOUNTER — Inpatient Hospital Stay (HOSPITAL_COMMUNITY): Payer: BLUE CROSS/BLUE SHIELD

## 2017-08-27 LAB — HEPARIN LEVEL (UNFRACTIONATED): Heparin Unfractionated: 0.16 IU/mL — ABNORMAL LOW (ref 0.30–0.70)

## 2017-08-27 LAB — BASIC METABOLIC PANEL
ANION GAP: 12 (ref 5–15)
BUN: 27 mg/dL — ABNORMAL HIGH (ref 6–20)
CALCIUM: 8.1 mg/dL — AB (ref 8.9–10.3)
CO2: 31 mmol/L (ref 22–32)
Chloride: 109 mmol/L (ref 101–111)
Creatinine, Ser: 1.16 mg/dL — ABNORMAL HIGH (ref 0.44–1.00)
GFR, EST AFRICAN AMERICAN: 56 mL/min — AB (ref >60)
GFR, EST NON AFRICAN AMERICAN: 48 mL/min — AB (ref >60)
GLUCOSE: 128 mg/dL — AB (ref 65–99)
POTASSIUM: 3.7 mmol/L (ref 3.5–5.1)
Sodium: 152 mmol/L — ABNORMAL HIGH (ref 135–145)

## 2017-08-27 LAB — CBC
HEMATOCRIT: 31.5 % — AB (ref 36.0–46.0)
Hemoglobin: 9.8 g/dL — ABNORMAL LOW (ref 12.0–15.0)
MCH: 27.8 pg (ref 26.0–34.0)
MCHC: 31.1 g/dL (ref 30.0–36.0)
MCV: 89.5 fL (ref 78.0–100.0)
PLATELETS: 155 10*3/uL (ref 150–400)
RBC: 3.52 MIL/uL — AB (ref 3.87–5.11)
RDW: 18.1 % — ABNORMAL HIGH (ref 11.5–15.5)
WBC: 20 10*3/uL — AB (ref 4.0–10.5)

## 2017-08-27 LAB — PHOSPHORUS: Phosphorus: 3.1 mg/dL (ref 2.5–4.6)

## 2017-08-27 LAB — GLUCOSE, CAPILLARY
GLUCOSE-CAPILLARY: 127 mg/dL — AB (ref 65–99)
GLUCOSE-CAPILLARY: 140 mg/dL — AB (ref 65–99)
Glucose-Capillary: 99 mg/dL (ref 65–99)

## 2017-08-27 LAB — MAGNESIUM: MAGNESIUM: 1.7 mg/dL (ref 1.7–2.4)

## 2017-08-27 LAB — AMMONIA: Ammonia: 34 umol/L (ref 9–35)

## 2017-08-27 LAB — PROCALCITONIN: PROCALCITONIN: 0.28 ng/mL

## 2017-08-27 MED ORDER — SODIUM CHLORIDE 0.9% FLUSH: 10.0000 mL | INTRAVENOUS | Status: DC | PRN

## 2017-08-27 MED ORDER — MORPHINE SULFATE (PF) 4 MG/ML IV SOLN
2.0000 mg | INTRAVENOUS | Status: DC | PRN
  Administered 2017-08-27 (×3): 2 mg via INTRAVENOUS
  Filled 2017-08-27 (×4): qty 1

## 2017-08-27 MED ORDER — CHLORHEXIDINE GLUCONATE CLOTH 2 % EX PADS: 6.0000 | MEDICATED_PAD | Freq: Every day | CUTANEOUS | Status: DC

## 2017-08-27 MED ORDER — FENTANYL CITRATE (PF) 100 MCG/2ML IJ SOLN
50.0000 ug | Freq: Once | INTRAMUSCULAR | Status: AC
  Administered 2017-08-27: 50 ug via INTRAVENOUS
  Filled 2017-08-27: qty 2

## 2017-08-28 LAB — CULTURE, BLOOD (ROUTINE X 2)
CULTURE: NO GROWTH
SPECIAL REQUESTS: ADEQUATE

## 2017-08-29 ENCOUNTER — Ambulatory Visit: Payer: BLUE CROSS/BLUE SHIELD | Admitting: Physician Assistant

## 2017-08-29 LAB — CULTURE, BLOOD (ROUTINE X 2)
CULTURE: NO GROWTH
SPECIAL REQUESTS: ADEQUATE

## 2017-08-31 ENCOUNTER — Telehealth: Payer: Self-pay

## 2017-08-31 NOTE — Telephone Encounter (Signed)
On 08/31/17 I received a d/c from [facility]. (original). The d/c is for burial.  The patient is a patient of Doctor Mannam. The d/c will be taken to Pulmonary Unit @ [place] for signature.  On 09/01/17 I received the d/c back from Doctor Mannam.  I got the d/c ready and called the funeral home to let them know the d/c is ready for pickup.  I also faxed a copy to the funeral home per the funeral home request.

## 2017-09-02 NOTE — Progress Notes (Signed)
Pt less responsive with HR in the 50s, code cart at bedside with pads attached, pt bagged. Dr.Agarwala called to bedside and contacted husband, Waunita Schooner.

## 2017-09-02 NOTE — Progress Notes (Signed)
PULMONARY / CRITICAL CARE MEDICINE   Name: Jennifer Delgado MRN: [id number] DOB: [birth year]    ADMISSION DATE:  08/05/2017 CONSULTATION DATE:  08/26/2017  REFERRING MD: [facility]  CHIEF COMPLAINT: Altered mental status  HISTORY OF PRESENT ILLNESS:   Jennifer Delgado is a 66yo female with hx metastatic NCSLCA (malignant effusion, mets to brain, liver, lung, possible spleen) with several recent admissions beginning 3/28-4/19 with hyponatremia, PNA, STEMI, VFib arrest and again 4/22-4/30 with hypovolemic v septic shock.  She was discharged to [facility]. Was brought to [facility] ED 5/22 after being found by staff at [place] with [place], dysphasia.  Apparently pt normally able to talk but on day of presentation was only tracking and nodding.  Patient ruled in for new right temporal CVA.  Found to be hypotensive with worsening renal function.  While she rallied initially her condition remained precarious.  Follow-up CT angiography revealed acute pulmonary emboli.    With improving mental status she was transferred to the floor to complete a course of antibiotics for a UTI.  Unfortunately her mental story status began to decline again and she was readmitted to the intensive care unit on  PAST MEDICAL HISTORY :  She  has a past medical history of Brain cancer ([place]), Cancer [facility]), Cardiac arrest ([place]), Cellulitis, Hypertension, Liver cancer ([place]), Non-small cell lung cancer with metastasis ([place]) (06/30/2017), and Renal disorder.  PAST SURGICAL HISTORY: She  has a past surgical history that includes Thoracentesis; Bronchoscopy; and Leg Surgery.  Allergies  Allergen Reactions  . Daptomycin Other (See Comments)    Rhyabdomyolosis (a breakdown of muscle tissue that releases a damaging protein into the blood)  . Dilaudid [Hydromorphone] Nausea And Vomiting    No current facility-administered medications on file prior to encounter.    Current Outpatient Medications on File Prior to Encounter   Medication Sig  . acetaminophen (TYLENOL) 325 MG tablet Take 650 mg by mouth every 6 (six) hours as needed for mild pain.  Marland Kitchen albuterol (PROAIR HFA) 108 (90 Base) MCG/ACT inhaler Inhale 2 puffs into the lungs every 6 (six) hours as needed for wheezing or shortness of breath.   . Amino Acids-Protein Hydrolys (FEEDING SUPPLEMENT, PRO-STAT SUGAR FREE 64,) LIQD Take 30 mLs by mouth 3 (three) times daily with meals.  Marland Kitchen amiodarone (PACERONE) 200 MG tablet Take 1 tablet (200 mg total) by mouth daily.  Marland Kitchen aspirin 81 MG chewable tablet Chew 1 tablet (81 mg total) by mouth daily.  Marland Kitchen atorvastatin (LIPITOR) 40 MG tablet Take 1 tablet (40 mg total) by mouth daily at 6 PM.  . beclomethasone (QVAR REDIHALER) 40 MCG/ACT inhaler Inhale 2 puffs into the lungs See admin instructions. Every 10 hours for SOB  . cetirizine (ZYRTEC) 10 MG tablet Take 10 mg by mouth at bedtime.  . collagenase (SANTYL) ointment Apply 1 application topically every evening. Apply to sacrum  . diclofenac sodium (VOLTAREN) 1 % GEL Apply 2 g topically every 6 (six) hours as needed (for pain).   Marland Kitchen diphenoxylate-atropine (LOMOTIL) 2.5-0.025 MG tablet Take 1 tablet by mouth 4 (four) times daily as needed for diarrhea or loose stools. (Patient taking differently: Take 1 tablet by mouth daily. May take 1 tablet by mouth every 6 hours as needed for diarrhea)  . furosemide (LASIX) 20 MG tablet Take 20 mg by mouth daily.   Marland Kitchen gabapentin (NEURONTIN) 300 MG capsule Take 900 mg by mouth at bedtime.  . Infant Care Products (DERMACLOUD EX) Apply 1 application topically every  12 (twelve) hours. To peri area for redness  . ipratropium-albuterol (DUONEB) 0.5-2.5 (3) MG/3ML SOLN Take 3 mLs by nebulization daily. May inhale 72ml via nebulization every 6 hours as needed for for shortness of breath or wheezing  . isosorbide mononitrate (IMDUR) 30 MG 24 hr tablet Take 30 mg by mouth daily.  Marland Kitchen levofloxacin (LEVAQUIN) 750 MG tablet Take 750 mg by mouth daily.  Marland Kitchen  lisinopril (PRINIVIL,ZESTRIL) 10 MG tablet Take 10 mg by mouth daily.  Marland Kitchen loperamide (IMODIUM A-D) 2 MG tablet Take 2 mg by mouth every 4 (four) hours as needed for diarrhea or loose stools.  . Lorlatinib 100 MG TABS Take 100 mg by mouth daily.  . ondansetron (ZOFRAN) 4 MG tablet Take 4 mg by mouth every 6 (six) hours as needed for nausea or vomiting.   Marland Kitchen oxyCODONE-acetaminophen (PERCOCET/ROXICET) 5-325 MG tablet Take 1 tablet by mouth every 8 (eight) hours as needed for severe pain.  . OXYGEN Inhale 2 L into the lungs continuous.  . pantoprazole (PROTONIX) 40 MG tablet Take 1 tablet (40 mg total) by mouth daily.  . potassium chloride SA (K-DUR,KLOR-CON) 20 MEQ tablet Take 20 mEq by mouth daily.   . sertraline (ZOLOFT) 100 MG tablet Take 1 tablet (100 mg total) by mouth daily.  Marland Kitchen triamcinolone cream (KENALOG) 0.1 % Apply 1 application topically every 12 (twelve) hours as needed (for rash following chemo).   Marland Kitchen liver oil-zinc oxide (DESITIN) 40 % ointment Apply topically as needed for irritation. (Patient not taking: Reported on 07/24/2017)  . metoprolol tartrate (LOPRESSOR) 25 MG tablet Take 1 tablet (25 mg total) by mouth 2 (two) times daily. (Patient not taking: Reported on 08/24/2017)    FAMILY HISTORY:  Her has no family status information on file.    SOCIAL HISTORY: She  reports that she has never smoked. She has never used smokeless tobacco. She reports that she drinks alcohol. She reports that she does not use drugs.  REVIEW OF SYSTEMS:   Chronically ill woman history as stated above.  Has never made a full functional recovery since her initial hospitalization back in April.  SUBJECTIVE:  Called to the bedside this morning by the nursing staff.  Patient is increasingly hypoxic and beginning to have episodes of bradycardia.  Pursuant to a discussion with Merlene Laughter, NP last evening, the patient is now appropriately and DNI.  Over the matter of CPR was left unresolved.  VITAL SIGNS: BP  (!) 145/103 (BP Location: Right Arm)   Pulse (!) 135   Temp 99 F (37.2 C) (Axillary)   Resp (!) 27   Ht 5\' 4"  (1.626 m)   Wt 222 lb 0.1 oz (100.7 kg)   SpO2 97%   BMI 38.11 kg/m   HEMODYNAMICS:  Remains normotensive  VENTILATOR SETTINGS:  Saturations in the 80s on 100 percent nonrebreather  INTAKE / OUTPUT: I/O last 3 completed shifts: In: 1479 [I.V.:1279; IV Piggyback:200] Out: 3025 [GMWNU:2725]  PHYSICAL EXAMINATION: General: Morbidly obese ill-appearing. Neuro: Obtunded. HEENT: Mucous membranes are dry. Cardiovascular: Extremities cool to touch. Lungs: Breath sounds are rhonchorous with loud transmission of upper airway sounds. Abdomen: Abdomen is distended but soft. Musculoskeletal: Diffuse edema Skin: Diffuse ecchymoses  LABS:  BMET Recent Labs  Lab 08/26/17 0420 08/26/17 1033 09/24/17 0119  NA 150* 153* 152*  K 3.0* 4.6 3.7  CL 111 114* 109  CO2 28 29 31   BUN 38* 34* 27*  CREATININE 1.22* 1.20* 1.16*  GLUCOSE 95 97 128*  Electrolytes Recent Labs  Lab 08/24/17 0550 08/25/17 0448 08/26/17 0420 08/26/17 1033 Aug 29, 2017 0119  CALCIUM 7.8* 8.1* 8.0* 7.9* 8.1*  MG 1.3* 2.0  --   --  1.7  PHOS 4.4 3.0  --   --  3.1    CBC Recent Labs  Lab 08/02/2017 1011  08/24/17 0324  08/24/17 1546 08/24/17 2258 08-29-17 0119  WBC 16.8*  --  23.0*  --   --   --  20.0*  HGB 6.9*   < > 8.6*   < > 8.5* 8.7* 9.8*  HCT 23.2*   < > 28.1*   < > 27.4* 26.9* 31.5*  PLT 175  --  142*  --   --   --  155   < > = values in this interval not displayed.    Coag's Recent Labs  Lab 08/22/2017 1011  INR 1.28    Sepsis Markers Recent Labs  Lab 08/02/2017 2349 08/26/17 1033 08/26/17 1301 08-29-2017 0119  LATICACIDVEN 1.4 1.6 1.9  --   PROCALCITON  --  0.56  --  0.28    ABG Recent Labs  Lab 08/24/17 0508 08/26/17 1718  PHART 7.174* 7.499*  PCO2ART 35.5 37.4  PO2ART 74.0* 57.2*    Liver Enzymes Recent Labs  Lab 08/11/2017 1011 08/26/17 1033  AST 14* 33   ALT 9* 8*  ALKPHOS 81 76  BILITOT 0.4 2.0*  ALBUMIN 2.0* 1.9*    Cardiac Enzymes Recent Labs  Lab 08/08/2017 1933 08/18/2017 2349  TROPONINI 0.26* 0.25*    Glucose Recent Labs  Lab 08/26/17 0754 08/26/17 1158 08/26/17 1638 08/26/17 2030 08-29-2017 0009 08-29-17 0406  GLUCAP 107* 120* 148* 170* 127* 140*    Imaging Dg Chest Port 1 View  Result Date: 08-29-17 CLINICAL DATA:  Respiratory failure. EXAM: PORTABLE CHEST 1 VIEW COMPARISON:  One-view chest x-ray 08/24/2017. FINDINGS: The heart size is normal. Patchy interstitial and airspace disease has increased. Bilateral pleural effusions have increased, right greater than left. IMPRESSION: 1. Increasing interstitial and airspace disease bilaterally is concerning for multi lobar pneumonia. Underlying edema is also present. 2. Increasing bilateral pleural effusions, right greater than left. Electronically Signed   By: San Morelle M.D.   On: 2017-08-29 09:03   [place] Ekg Site Rite  Result Date: 08/26/2017 If Site Rite image not attached, placement could not be confirmed due to current cardiac rhythm.   DISCUSSION: 67 year old woman with non-small cell metastatic lung cancer unable to take her therapy.  Multiple medical problems including repeated cardiac arrest several CVAs and now acute pulmonary emboli.  Aspirations are now in extremis.  I have discussed the situation with the husband.  She is not able to survive this current hospitalization.  ASSESSMENT / PLAN:  At this point, we have transition to comfort measures only and ensure the patient has no discomfort as we await family to arrive.  Doubt that she will survive long enough to be transferred to hospice.   Kipp Brood, MD [facility] and [name] Pager: [phone] After hours [phone]  08-29-17, 9:53 AM

## 2017-09-02 NOTE — Progress Notes (Signed)
Peripherally Inserted Central Catheter/Midline Placement  The IV Nurse has discussed with the patient and/or persons authorized to consent for the patient, the purpose of this procedure and the potential benefits and risks involved with this procedure.  The benefits include less needle sticks, lab draws from the catheter, and the patient may be discharged home with the catheter. Risks include, but not limited to, infection, bleeding, blood clot (thrombus formation), and puncture of an artery; nerve damage and irregular heartbeat and possibility to perform a PICC exchange if needed/ordered by physician.  Alternatives to this procedure were also discussed.  Bard Power PICC patient education guide, fact sheet on infection prevention and patient information card has been provided to patient /or left at bedside.    PICC/Midline Placement Documentation  PICC Double Lumen 30-Aug-2017 PICC Left Brachial 44 cm 0 cm (Active)  Indication for Insertion or Continuance of Line Prolonged intravenous therapies 08/30/17  8:54 AM  Exposed Catheter (cm) 0 cm 2017/08/30  8:54 AM  Site Assessment Clean;Dry;Intact Aug 30, 2017  8:54 AM  Lumen #1 Status Flushed;Blood return noted;Saline locked 08/30/2017  8:54 AM  Lumen #2 Status Flushed;Blood return noted;Saline locked Aug 30, 2017  8:54 AM  Dressing Type Transparent 08-30-17  8:54 AM  Dressing Status Clean;Dry;Intact 08/30/2017  8:54 AM  Dressing Change Due 09/03/17 08-30-2017  8:54 AM       [address] 2017/08/30, 8:57 AM

## 2017-09-02 NOTE — Evaluation (Signed)
SLP Cancellation Note  Patient Details Name: Bre Pecina MRN: [id number] DOB: Nov 27, 1950   Cancelled treatment:       Reason Eval/Treat Not Completed: Medical issues which prohibited therapy(note HR in 50s, RR 24, on HFNC 15Liters,will continue efforts)   Macario Golds Sep 18, 2017, 9:57 AM   Luanna Salk, [place] [facility] SLP [phone]

## 2017-09-02 NOTE — Consult Note (Signed)
                                                                                   Consultation Note Date: Sep 25, 2017   Patient Name: Jennifer Delgado  DOB: 06-22-1950  MRN: [id number]  Age / Sex: 67 y.o., female  PCP: Daylene Posey, [employer] Referring Physician: Allie Bossier, MD  Reason for Consultation: Establishing goals of care and Psychosocial/spiritual support  HPI/Patient Profile: 67 y.o. female  with past medical history of non small cell lung cancer with mets to the brain and liver, cardiac arrest, and morbid obesity who was admitted on 08/06/2017 with severe anemia, acute stroke, and cellulitis. Further work up has revealed acute bilateral PE along with DVT.  She is currently on high flow oxygen in the ICU.  Her albumin is 1.9.   Discussed with beside RN and CCM NP.  Then went to room to examine patient and found she had already expired.  Signed by: Florentina Jenny, PA-C Palliative Medicine Pager: [phone]  Please contact Palliative Medicine Team phone at [phone] for questions and concerns.  For individual provider: See Amion  No charge note.

## 2017-09-02 NOTE — Progress Notes (Signed)
ANTICOAGULATION CONSULT NOTE - Follow up [place] for heparin Indication: pulmonary embolus  Allergies  Allergen Reactions  . Daptomycin Other (See Comments)    Rhyabdomyolosis (a breakdown of muscle tissue that releases a damaging protein into the blood)  . Dilaudid [Hydromorphone] Nausea And Vomiting    Patient Measurements: Height: 5\' 4"  (162.6 cm) Weight: 222 lb 0.1 oz (100.7 kg) IBW/kg (Calculated) : 54.7 Heparin Dosing Weight: 81.8 kg  Vital Signs: Temp: 99.3 F (37.4 C) (05/26 0000) Temp Source: Axillary (05/26 0000) BP: 131/89 (05/26 0200) Pulse Rate: 121 (05/26 0200)  Labs: Recent Labs    08/24/17 0324  08/24/17 1546 08/24/17 2258  08/26/17 0420 08/26/17 1033 29-Aug-2017 0119  HGB 8.6*   < > 8.5* 8.7*  --   --   --  9.8*  HCT 28.1*   < > 27.4* 26.9*  --   --   --  31.5*  PLT 142*  --   --   --   --   --   --  155  HEPARINUNFRC  --   --   --   --   --   --   --  0.16*  CREATININE  --    < >  --   --    < > 1.22* 1.20* 1.16*   < > = values in this interval not displayed.     Medical History: Past Medical History:  Diagnosis Date  . Brain cancer ([place])   . Cancer ([place])    lung  . Cardiac arrest ([place])   . Cellulitis   . Hypertension   . Liver cancer ([place])   . Non-small cell lung cancer with metastasis ([place]) 06/30/2017  . Renal disorder     Assessment: Multiple comorbidities Acute PE and DVT High bleed risk Recent acute stroke, no tPA given NSLC with mets to brain Starting heparin gtt  Heparin level is SUBtherapeutic this morning at 0.16. Per RN, no problems with infusion or signs of bleeding.   Goal of Therapy:  Heparin level goal: 0.3-0.5 units/mL Monitor platelets by anticoagulation protocol: Yes   Plan:  Increase heparin infusion to 1550 units/hr Check anti-Xa level in 6 hours and daily while on heparin Continue to monitor H&H and platelets   Thank you for allowing [place] to participate in this patients care.  Jens Som, PharmD Main pharmacy at: [phone] 08-29-17 2:59 AM

## 2017-09-02 NOTE — Progress Notes (Signed)
   Per Dr. Jaynee Eagles the stroke team will sign off at this time. Please call if we can be of further service.  Mikey Bussing PA-C [facility] Pager [phone] 2017-09-23, 11:25 AM

## 2017-09-02 NOTE — Progress Notes (Signed)
4mg  Morphine removed from Pyxis. 2mg  Morphine PRN dose given. Waste witnessed with Ilona Sorrel, RN. Unable to waste in [place] as pt has been removed from the system.

## 2017-09-02 DEATH — deceased

## 2017-10-02 NOTE — Death Summary Note (Signed)
DEATH SUMMARY   Patient Details  Name: Jennifer Delgado MRN: [id number] DOB: May 18, 1950  Admission/Discharge Information   Admit Date:  September 22, 2017  Date of Death: Date of Death: 09/26/17  Time of Death: Time of Death: 07/12/1312  Length of Stay: 4  Referring Physician: Daylene Posey, [place]   Reason(s) for Hospitalization  Respiratory failure.  Diagnoses  Preliminary cause of death:  Secondary Diagnoses (including complications and co-morbidities):  Active Problems:   Non-small cell lung cancer with metastasis (HCC)   GERD (gastroesophageal reflux disease)   Toxic metabolic encephalopathy   Acute on chronic respiratory failure with hypoxia (HCC)   Dyslipidemia   Altered mental state   Cerebral embolism with cerebral infarction   Pulmonary embolism (HCC)   Acute bilateral deep vein thrombosis (DVT) of femoral veins [facility])   Brief Hospital Course (including significant findings, care, treatment, and services provided and events leading to death)  [name] is a 67 y.o. year old female who was in chronically poor health with a hospitalization at [facility] for cardiac arrest. Presented from SNF with confusion and hypotension.  Worsening respiratory status with evidence of new stroke on CT. Continued decline from respiratory standpoint. Given poor long-term prognosis, family agreed to DNR/DNI status. Patient transition to comfort care.   Pertinent Labs and Studies  Significant Diagnostic Studies Ct Angio Head W Or Wo Contrast  Result Date: 08/25/2017 CLINICAL DATA:  Initial evaluation for acute stroke, ataxia. EXAM: CT ANGIOGRAPHY HEAD AND NECK TECHNIQUE: Multidetector CT imaging of the head and neck was performed using the standard protocol during bolus administration of intravenous contrast. Multiplanar CT image reconstructions and MIPs were obtained to evaluate the vascular anatomy. Carotid stenosis measurements (when applicable) are obtained utilizing NASCET criteria, using the distal internal  carotid diameter as the denominator. CONTRAST:  7mL ISOVUE-370 IOPAMIDOL (ISOVUE-370) INJECTION 76% COMPARISON:  Prior CT from Sep 22, 2017 as well as previous MRI from 07/12/2017. FINDINGS: CT HEAD FINDINGS Brain: Atrophy with chronic small vessel ischemic disease again noted. Continued evolution of multiple subacute infarcts from previous brain MRI on 07/12/2017. Additional new more acute to early subacute ischemic infarct involving the lateral right temporal lobe, similar in size and distribution as compared to previous. No acute intracranial hemorrhage. Known intracranial metastases not well seen. No midline shift or significant mass effect. No hydrocephalus. No extra-axial fluid collection. Vascular: No hyperdense vessel. Scattered vascular calcifications noted within the carotid siphons. Skull: Scalp soft tissues and calvarium within normal limits. Sinuses: Small right maxillary sinus retention cyst. Paranasal sinuses are otherwise clear. Chronic bilateral mastoid effusions. Orbits: No acute abnormality about the globes and orbits. Review of the MIP images confirms the above findings CTA NECK FINDINGS Aortic arch: Visualized aortic arch of normal caliber with normal branch pattern. No flow-limiting stenosis about the origin of the great vessels. Visualized subclavian arteries widely patent. Right carotid system: Right common and internal carotid arteries patent without stenosis, dissection, or occlusion. No significant atheromatous narrowing about the right carotid bifurcation. Left carotid system: Left common and internal carotid arteries widely patent without stenosis, dissection, or occlusion. Minimal atheromatous plaque about the left bifurcation without stenosis. Vertebral arteries: Both of the vertebral arteries arise from the subclavian arteries. Left vertebral artery dominant. Vertebral arteries widely patent within the neck without stenosis, dissection, or occlusion. Skeleton: No acute osseous  abnormality. No worrisome lytic or blastic osseous lesions. Other neck: No acute soft tissue abnormality within the neck. Mildly prominent 12 mm left level II lymph node. Additionally, scattered right supraclavicular nodes measure  up to 9 mm, indeterminate. Subcentimeter hypodense right thyroid nodule, of doubtful significance. Salivary glands within normal limits. Upper chest: Left-sided central venous catheter partially visualized. Filling defects within the left main and proximal segmental pulmonary arteries, consistent with acute pulmonary emboli (series 9, image 171). Evidence for segmental and subsegmental right-sided emboli within the right upper lobe as well (series 9, image 173). Irregular enlarged bilateral pleural effusions, partially loculated on the left. Extensive bilateral airspace disease partially visualized right worse than left. Review of the MIP images confirms the above findings CTA HEAD FINDINGS Anterior circulation: Petrous segments widely patent bilaterally. Mild scattered atheromatous plaque within the cavernous ICAs without hemodynamically significant stenosis. A1 segments, anterior communicating artery common anterior cerebral arteries patent to their distal aspects without stenosis. No M1 stenosis or occlusion. Distal MCA branches well perfused and symmetric. Posterior circulation: Vertebral arteries patent to the vertebrobasilar junction without stenosis. Left vertebral artery dominant. Posterior inferior cerebral arteries patent bilaterally. Basilar artery widely patent to its distal aspect. Superior cerebellar and posterior cerebral arteries widely patent bilaterally without stenosis. Small right posterior communicating artery. Venous sinuses: Patent. Anatomic variants: None significant. Delayed phase: Delayed sequence markedly degraded by motion artifact. No obvious abnormal enhancement. No new intracranial metastases not well seen. Review of the MIP images confirms the above findings  IMPRESSION: 1. Acute pulmonary emboli involving the left greater than right pulmonary arteries as above, partially visualized. Further assessment with dedicated CTA of the chest could be performed for complete evaluation as clinically warranted. 2. Large bilateral pleural effusions with extensive parenchymal airspace disease, right greater than left, which could reflect edema, infection, or sequelae of patient's known malignancy. 3. Negative CTA for large vessel occlusion. No hemodynamically significant or correctable stenosis within the major arterial vasculature of the head and neck. 4. Evolving acute to early subacute right temporal infarct, similar relative to prior CT from 08/09/2017, but new relative to previous MRI from 07/12/2017. Additional subacute infarcts involving the bilateral cerebral hemispheres grossly similar to prior MRI. No significant mass effect or associated hemorrhage. 5. Known intracranial metastases not well seen on this exam. Critical Value/emergent results were called by telephone at the time of interpretation on 08/25/2017 at 9:58 pm to Dr. Tamala Julian, who verbally acknowledged these results. Electronically Signed   By: Jeannine Boga M.D.   On: 08/25/2017 22:05   Ct Head Wo Contrast  Result Date: 08/08/2017 CLINICAL DATA:  Altered level of consciousness. EXAM: CT HEAD WITHOUT CONTRAST TECHNIQUE: Contiguous axial images were obtained from the base of the skull through the vertex without intravenous contrast. COMPARISON:  MRI scan of July 12, 2017.  CT scan of July 03, 2017. FINDINGS: Brain: Mild diffuse cortical atrophy is noted. Mild chronic ischemic white matter disease is noted. New low density and sulcal effacement is seen involving right frontotemporal lobe concerning for acute infarction. No midline shift is noted. Ventricular size is within normal limits. Chronic ischemic white matter disease is noted. No hemorrhage is noted. Vascular: No hyperdense vessel or unexpected  calcification. Skull: Normal. Negative for fracture or focal lesion. Sinuses/Orbits: No acute finding. Other: None. IMPRESSION: New low density and sulcal effacement is seen involving right frontotemporal lobe concerning for acute infarction. Further evaluation with MRI is recommended. Electronically Signed   By: Marijo Conception, M.D.   On: 08/22/2017 12:27   Ct Angio Neck W Or Wo Contrast  Result Date: 08/25/2017 CLINICAL DATA:  Initial evaluation for acute stroke, ataxia. EXAM: CT ANGIOGRAPHY HEAD AND NECK TECHNIQUE: Multidetector CT  imaging of the head and neck was performed using the standard protocol during bolus administration of intravenous contrast. Multiplanar CT image reconstructions and MIPs were obtained to evaluate the vascular anatomy. Carotid stenosis measurements (when applicable) are obtained utilizing NASCET criteria, using the distal internal carotid diameter as the denominator. CONTRAST:  56mL ISOVUE-370 IOPAMIDOL (ISOVUE-370) INJECTION 76% COMPARISON:  Prior CT from 08/08/2017 as well as previous MRI from 07/12/2017. FINDINGS: CT HEAD FINDINGS Brain: Atrophy with chronic small vessel ischemic disease again noted. Continued evolution of multiple subacute infarcts from previous brain MRI on 07/12/2017. Additional new more acute to early subacute ischemic infarct involving the lateral right temporal lobe, similar in size and distribution as compared to previous. No acute intracranial hemorrhage. Known intracranial metastases not well seen. No midline shift or significant mass effect. No hydrocephalus. No extra-axial fluid collection. Vascular: No hyperdense vessel. Scattered vascular calcifications noted within the carotid siphons. Skull: Scalp soft tissues and calvarium within normal limits. Sinuses: Small right maxillary sinus retention cyst. Paranasal sinuses are otherwise clear. Chronic bilateral mastoid effusions. Orbits: No acute abnormality about the globes and orbits. Review of the MIP  images confirms the above findings CTA NECK FINDINGS Aortic arch: Visualized aortic arch of normal caliber with normal branch pattern. No flow-limiting stenosis about the origin of the great vessels. Visualized subclavian arteries widely patent. Right carotid system: Right common and internal carotid arteries patent without stenosis, dissection, or occlusion. No significant atheromatous narrowing about the right carotid bifurcation. Left carotid system: Left common and internal carotid arteries widely patent without stenosis, dissection, or occlusion. Minimal atheromatous plaque about the left bifurcation without stenosis. Vertebral arteries: Both of the vertebral arteries arise from the subclavian arteries. Left vertebral artery dominant. Vertebral arteries widely patent within the neck without stenosis, dissection, or occlusion. Skeleton: No acute osseous abnormality. No worrisome lytic or blastic osseous lesions. Other neck: No acute soft tissue abnormality within the neck. Mildly prominent 12 mm left level II lymph node. Additionally, scattered right supraclavicular nodes measure up to 9 mm, indeterminate. Subcentimeter hypodense right thyroid nodule, of doubtful significance. Salivary glands within normal limits. Upper chest: Left-sided central venous catheter partially visualized. Filling defects within the left main and proximal segmental pulmonary arteries, consistent with acute pulmonary emboli (series 9, image 171). Evidence for segmental and subsegmental right-sided emboli within the right upper lobe as well (series 9, image 173). Irregular enlarged bilateral pleural effusions, partially loculated on the left. Extensive bilateral airspace disease partially visualized right worse than left. Review of the MIP images confirms the above findings CTA HEAD FINDINGS Anterior circulation: Petrous segments widely patent bilaterally. Mild scattered atheromatous plaque within the cavernous ICAs without  hemodynamically significant stenosis. A1 segments, anterior communicating artery common anterior cerebral arteries patent to their distal aspects without stenosis. No M1 stenosis or occlusion. Distal MCA branches well perfused and symmetric. Posterior circulation: Vertebral arteries patent to the vertebrobasilar junction without stenosis. Left vertebral artery dominant. Posterior inferior cerebral arteries patent bilaterally. Basilar artery widely patent to its distal aspect. Superior cerebellar and posterior cerebral arteries widely patent bilaterally without stenosis. Small right posterior communicating artery. Venous sinuses: Patent. Anatomic variants: None significant. Delayed phase: Delayed sequence markedly degraded by motion artifact. No obvious abnormal enhancement. No new intracranial metastases not well seen. Review of the MIP images confirms the above findings IMPRESSION: 1. Acute pulmonary emboli involving the left greater than right pulmonary arteries as above, partially visualized. Further assessment with dedicated CTA of the chest could be performed for complete evaluation  as clinically warranted. 2. Large bilateral pleural effusions with extensive parenchymal airspace disease, right greater than left, which could reflect edema, infection, or sequelae of patient's known malignancy. 3. Negative CTA for large vessel occlusion. No hemodynamically significant or correctable stenosis within the major arterial vasculature of the head and neck. 4. Evolving acute to early subacute right temporal infarct, similar relative to prior CT from 08/26/2017, but new relative to previous MRI from 07/12/2017. Additional subacute infarcts involving the bilateral cerebral hemispheres grossly similar to prior MRI. No significant mass effect or associated hemorrhage. 5. Known intracranial metastases not well seen on this exam. Critical Value/emergent results were called by telephone at the time of interpretation on  08/25/2017 at 9:58 pm to Dr. Tamala Julian, who verbally acknowledged these results. Electronically Signed   By: Jeannine Boga M.D.   On: 08/25/2017 22:05   Dg Chest Port 1 View  Result Date: 2017-09-19 CLINICAL DATA:  Respiratory failure. EXAM: PORTABLE CHEST 1 VIEW COMPARISON:  One-view chest x-ray 08/24/2017. FINDINGS: The heart size is normal. Patchy interstitial and airspace disease has increased. Bilateral pleural effusions have increased, right greater than left. IMPRESSION: 1. Increasing interstitial and airspace disease bilaterally is concerning for multi lobar pneumonia. Underlying edema is also present. 2. Increasing bilateral pleural effusions, right greater than left. Electronically Signed   By: San Morelle M.D.   On: 2017-09-19 09:03   Dg Chest Port 1 View  Result Date: 08/24/2017 CLINICAL DATA:  Respiratory failure. EXAM: PORTABLE CHEST 1 VIEW COMPARISON:  Radiograph of Aug 23, 2017. FINDINGS: Stable cardiomegaly with central pulmonary vascular congestion is noted. Interval placement of left-sided PICC line with distal tip in expected position of the SVC. No pneumothorax is noted. Stable bilateral patchy diffuse airspace opacities are noted concerning for pneumonia or edema. Minimal left pleural effusion may be present. Bony thorax is unremarkable. IMPRESSION: Stable bilateral lung opacities are noted concerning for pneumonia or edema. Minimal left pleural effusion is noted. Interval placement of left-sided PICC line with distal tip in expected position of SVC. Electronically Signed   By: Marijo Conception, M.D.   On: 08/24/2017 09:08   Dg Chest Port 1 View  Result Date: 08/06/2017 CLINICAL DATA:  Loss of consciousness EXAM: PORTABLE CHEST 1 VIEW COMPARISON:  07/24/2017 FINDINGS: Cardiac shadow is at the upper limits of normal in size. Patchy changes are again seen in lung bases bilaterally. Mild vascular congestion is noted. No sizable IMPRESSION: Effusion is mild vascular congestion  and patchy infiltrates bilaterally. Electronically Signed   By: Inez Catalina M.D.   On: 08/03/2017 10:06   [place] Ekg Site Rite  Result Date: 08/26/2017 If Site Rite image not attached, placement could not be confirmed due to current cardiac rhythm.  [place] Ekg Site Rite  Result Date: 08/03/2017 If Site Rite image not attached, placement could not be confirmed due to current cardiac rhythm.   Microbiology No results found for this or any previous visit (from the past 240 hour(s)).  Lab Basic Metabolic Panel: No results for input(s): NA, K, CL, CO2, GLUCOSE, BUN, CREATININE, CALCIUM, MG, PHOS in the last 168 hours. Liver Function Tests: No results for input(s): AST, ALT, ALKPHOS, BILITOT, PROT, ALBUMIN in the last 168 hours. No results for input(s): LIPASE, AMYLASE in the last 168 hours. No results for input(s): AMMONIA in the last 168 hours. CBC: No results for input(s): WBC, NEUTROABS, HGB, HCT, MCV, PLT in the last 168 hours. Cardiac Enzymes: No results for input(s): CKTOTAL, CKMB, CKMBINDEX, TROPONINI in  the last 168 hours. Sepsis Labs: No results for input(s): PROCALCITON, WBC, LATICACIDVEN in the last 168 hours.  Procedures/Operations  Nil   Rollo Farquhar 09/04/2017, 4:13 PM

## 2018-08-20 IMAGING — CT CT ABD-PELV W/O CM
2 of 4 series · 15 of 46 positions shown, 17 images · non-contrast
Comparison: CT of the abdomen and pelvis performed 01/15/2015, and
renal ultrasound performed 01/22/2015

CLINICAL DATA: Acute onset of lower abdominal pain, renal failure
and shock.

EXAM:
CT ABDOMEN AND PELVIS WITHOUT CONTRAST
TECHNIQUE: Multidetector CT imaging of the abdomen and pelvis was performed
following the standard protocol without IV contrast.

[Series 3: abd/ pelvis 5.0 i30f 2 · axial · 0.98mm/px · z∈[-592,-152]mm · 12 of 100 slices shown, 14 images]
[im 8/100  soft-tissue]
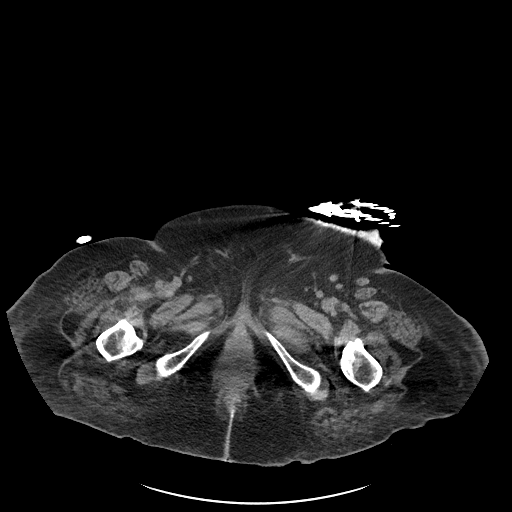
[im 8/100  bone]
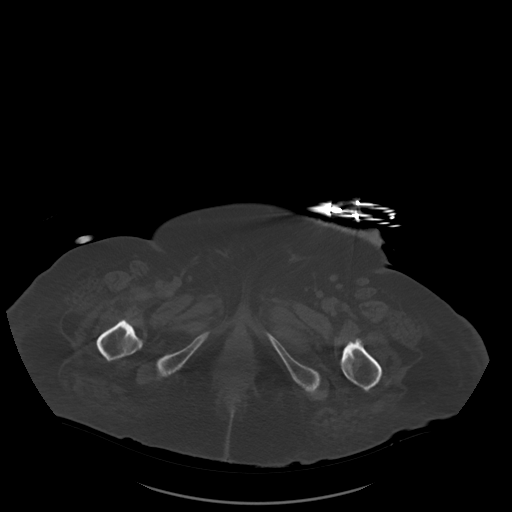
[im 16/100  soft-tissue]
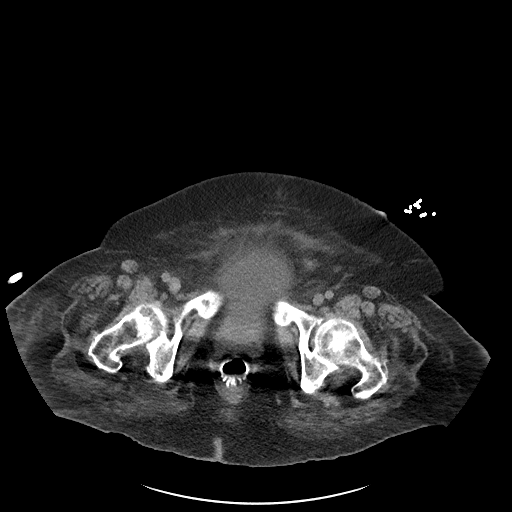
[im 24/100  soft-tissue]
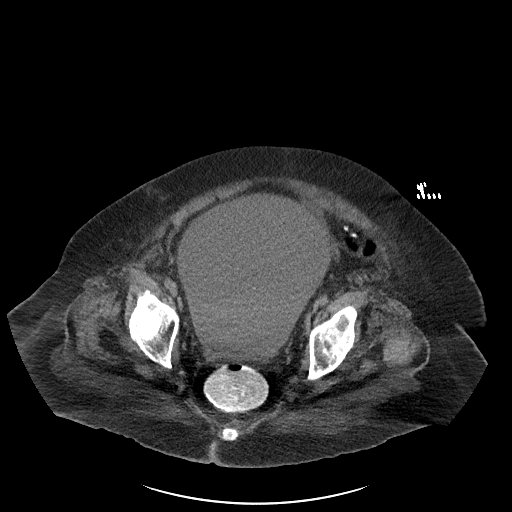
[im 32/100  soft-tissue]
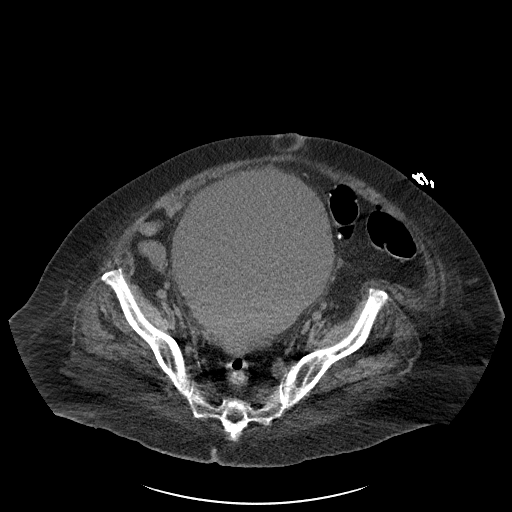
[im 40/100  soft-tissue]
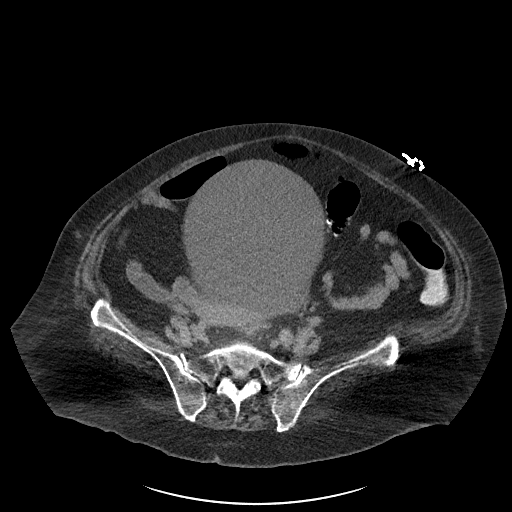
[im 48/100  soft-tissue]
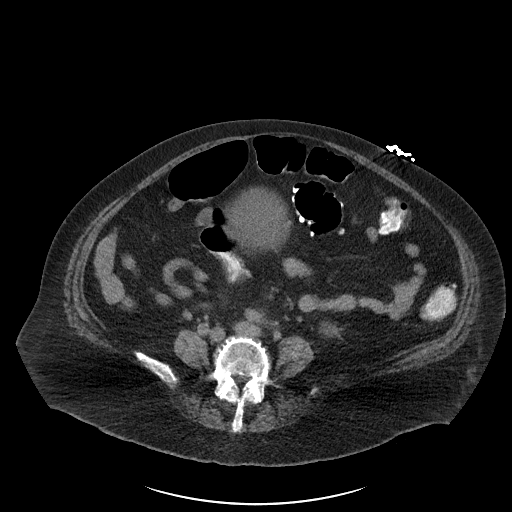
[im 56/100  soft-tissue]
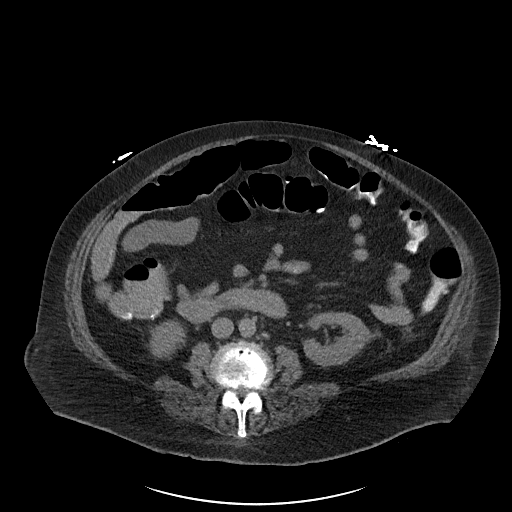
[im 64/100  soft-tissue]
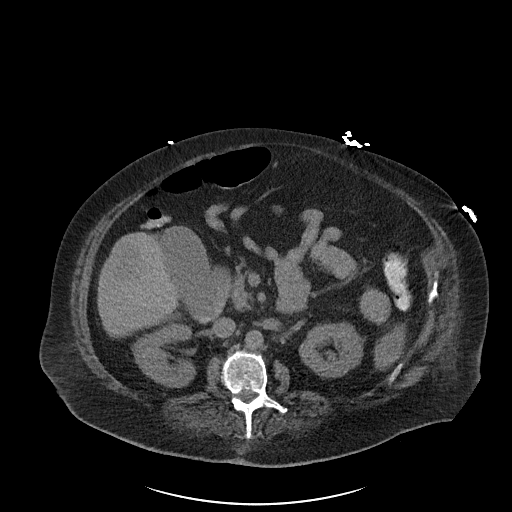
[im 72/100  soft-tissue]
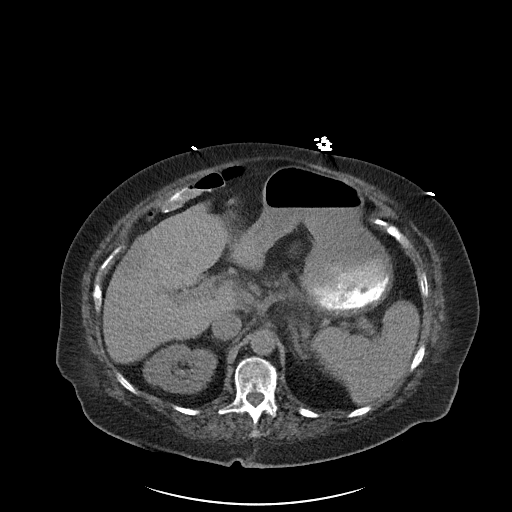
[im 72/100  bone]
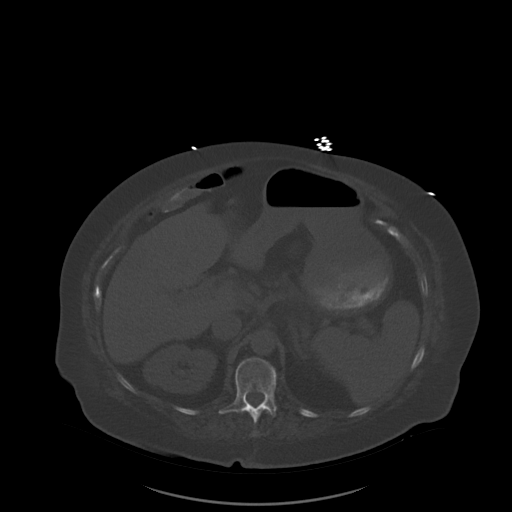
[im 80/100  soft-tissue]
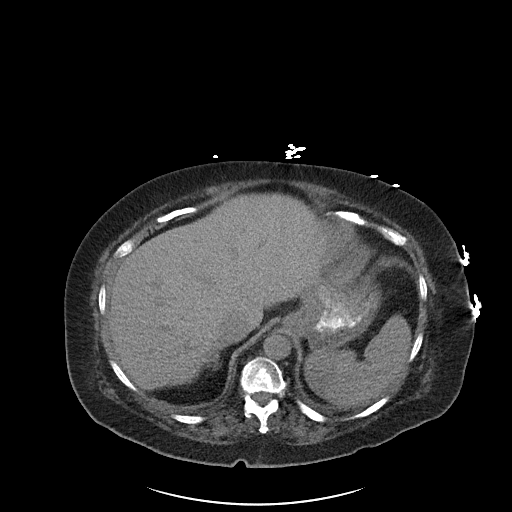
[im 88/100  soft-tissue]
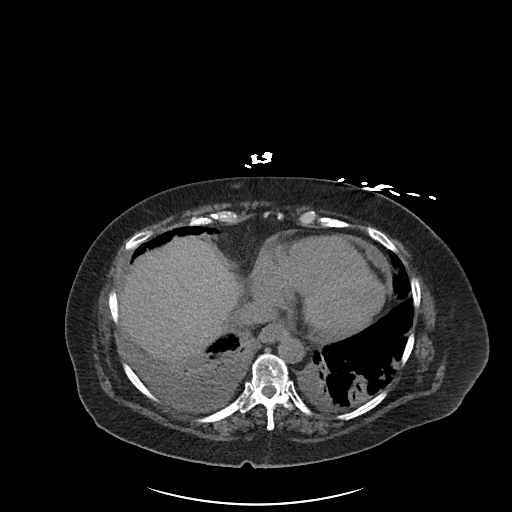
[im 96/100  soft-tissue]
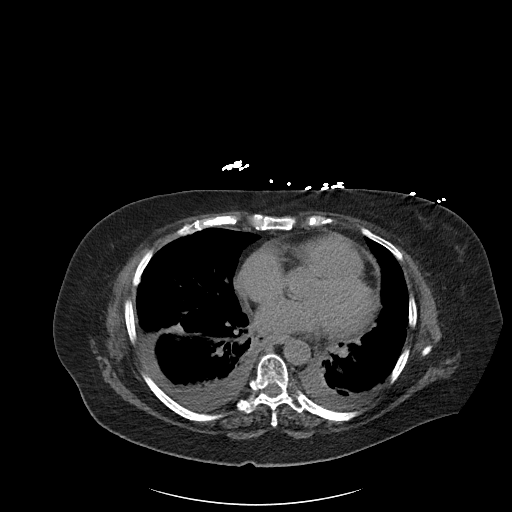

[Series 6: cor st · coronal · 0.97mm/px · 3 of 115 slices shown]
[im 39/115  soft-tissue]
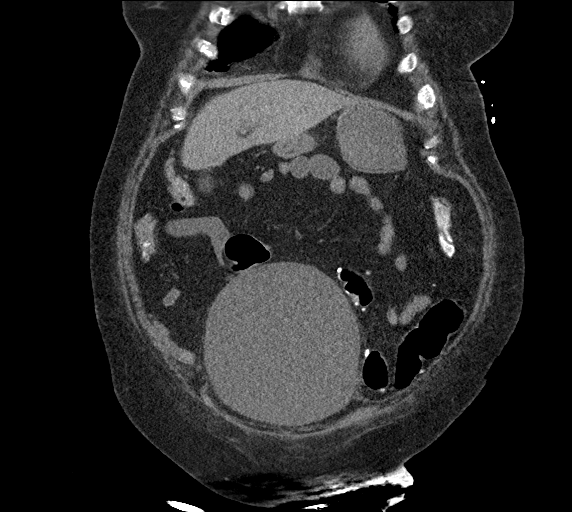
[im 51/115  soft-tissue]
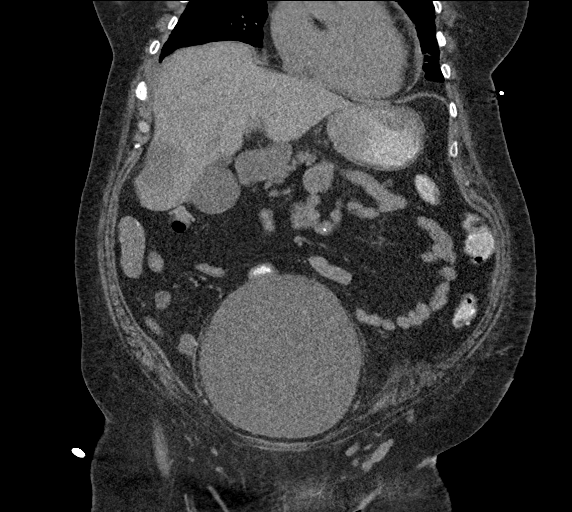
[im 64/115  soft-tissue]
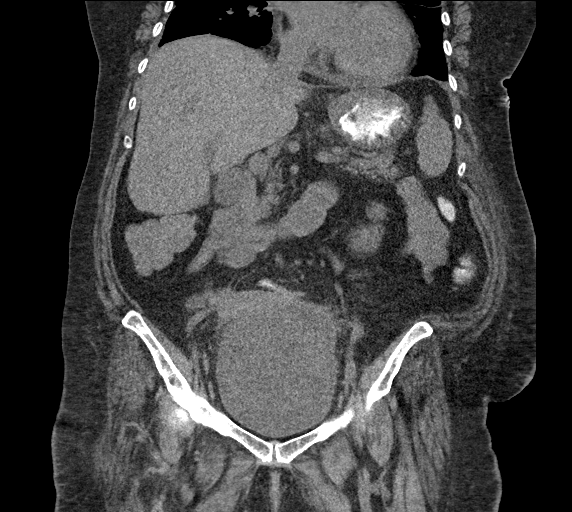

[15 of 46 positions shown; findings below may reference images not displayed]

FINDINGS: Lower chest: Small bilateral pleural effusions are noted. Patchy
bibasilar airspace opacities raise concern for pneumonia. A trace
pericardial effusion is noted.

Hepatobiliary: A new vague 5.5 cm mass is noted at the right hepatic
lobe. A smaller 3.6 cm mass is noted more superiorly. Per
correlation with the patient's clinical history, this reflects known
metastatic disease to the liver.

Stones are seen dependently within the gallbladder. The gallbladder
is otherwise unremarkable. The common bile duct remains normal in
caliber.

Pancreas: The pancreas is within normal limits.

Spleen: A nonspecific vague 2.9 cm hypodensity is noted at the
inferior aspect of the spleen. Decreased attenuation along the
medial aspect of the spleen may reflect remote infarct.

Adrenals/Urinary Tract: The adrenal glands are unremarkable in
appearance.

Nonspecific perinephric stranding is noted bilaterally. Scattered
nonobstructing bilateral renal stones are seen, measuring up to 4 mm
in size. There is no evidence of hydronephrosis. No obstructing
ureteral stones are identified.

Stomach/Bowel: The stomach is unremarkable in appearance. The small
bowel is within normal limits. The appendix is normal in caliber,
without evidence of appendicitis.

Scattered diverticulosis is noted along the descending and sigmoid
colon, with contrast filled diverticula. The colon is otherwise
unremarkable.

Vascular/Lymphatic: The abdominal aorta is unremarkable in
appearance. The inferior vena cava is grossly unremarkable. No
retroperitoneal lymphadenopathy is seen. No pelvic sidewall
lymphadenopathy is identified.

Reproductive: The bladder is markedly distended, extending above the
umbilicus. The uterus is somewhat compressed but otherwise
unremarkable in appearance. No suspicious adnexal masses are seen.

Other: A small umbilical hernia is noted, containing only fat.

Musculoskeletal: No acute osseous abnormalities are identified.
There is mild chronic loss of height at vertebral body T12. The
visualized musculature is unremarkable in appearance.
IMPRESSION: 1. Markedly distended bladder, extending above the umbilicus. This
may be contributing to the patient's renal failure. Foley catheter
placement would be helpful, as deemed clinically appropriate.
2. Small bilateral pleural effusions. Patchy bibasilar airspace
opacities raise concern for pneumonia.
3. Hepatic masses measure up to 5.5 cm in size, reflecting known
metastatic disease to the liver.
4. Vague 2.9 cm hypodensity at the inferior aspect of the spleen may
also reflect metastatic disease.
5. Nonobstructing bilateral renal stones measure up to 4 mm in size.
6. Cholelithiasis.  Gallbladder otherwise unremarkable.
7. Scattered diverticulosis along the descending and sigmoid colon,
without evidence of diverticulitis.
8. Small umbilical hernia, containing only fat.
9. Trace pericardial effusion noted.

These results were called by telephone at the time of interpretation
on 07/24/2017 at [DATE] to Nursing on [id number], who verbally
acknowledged these results.

## 2018-09-20 IMAGING — DX DG CHEST 1V PORT
1 series · 1 of 1 positions shown · non-contrast
Comparison: Radiograph August 23, 2017.

CLINICAL DATA: Respiratory failure.

EXAM:
PORTABLE CHEST 1 VIEW

[chest ap]
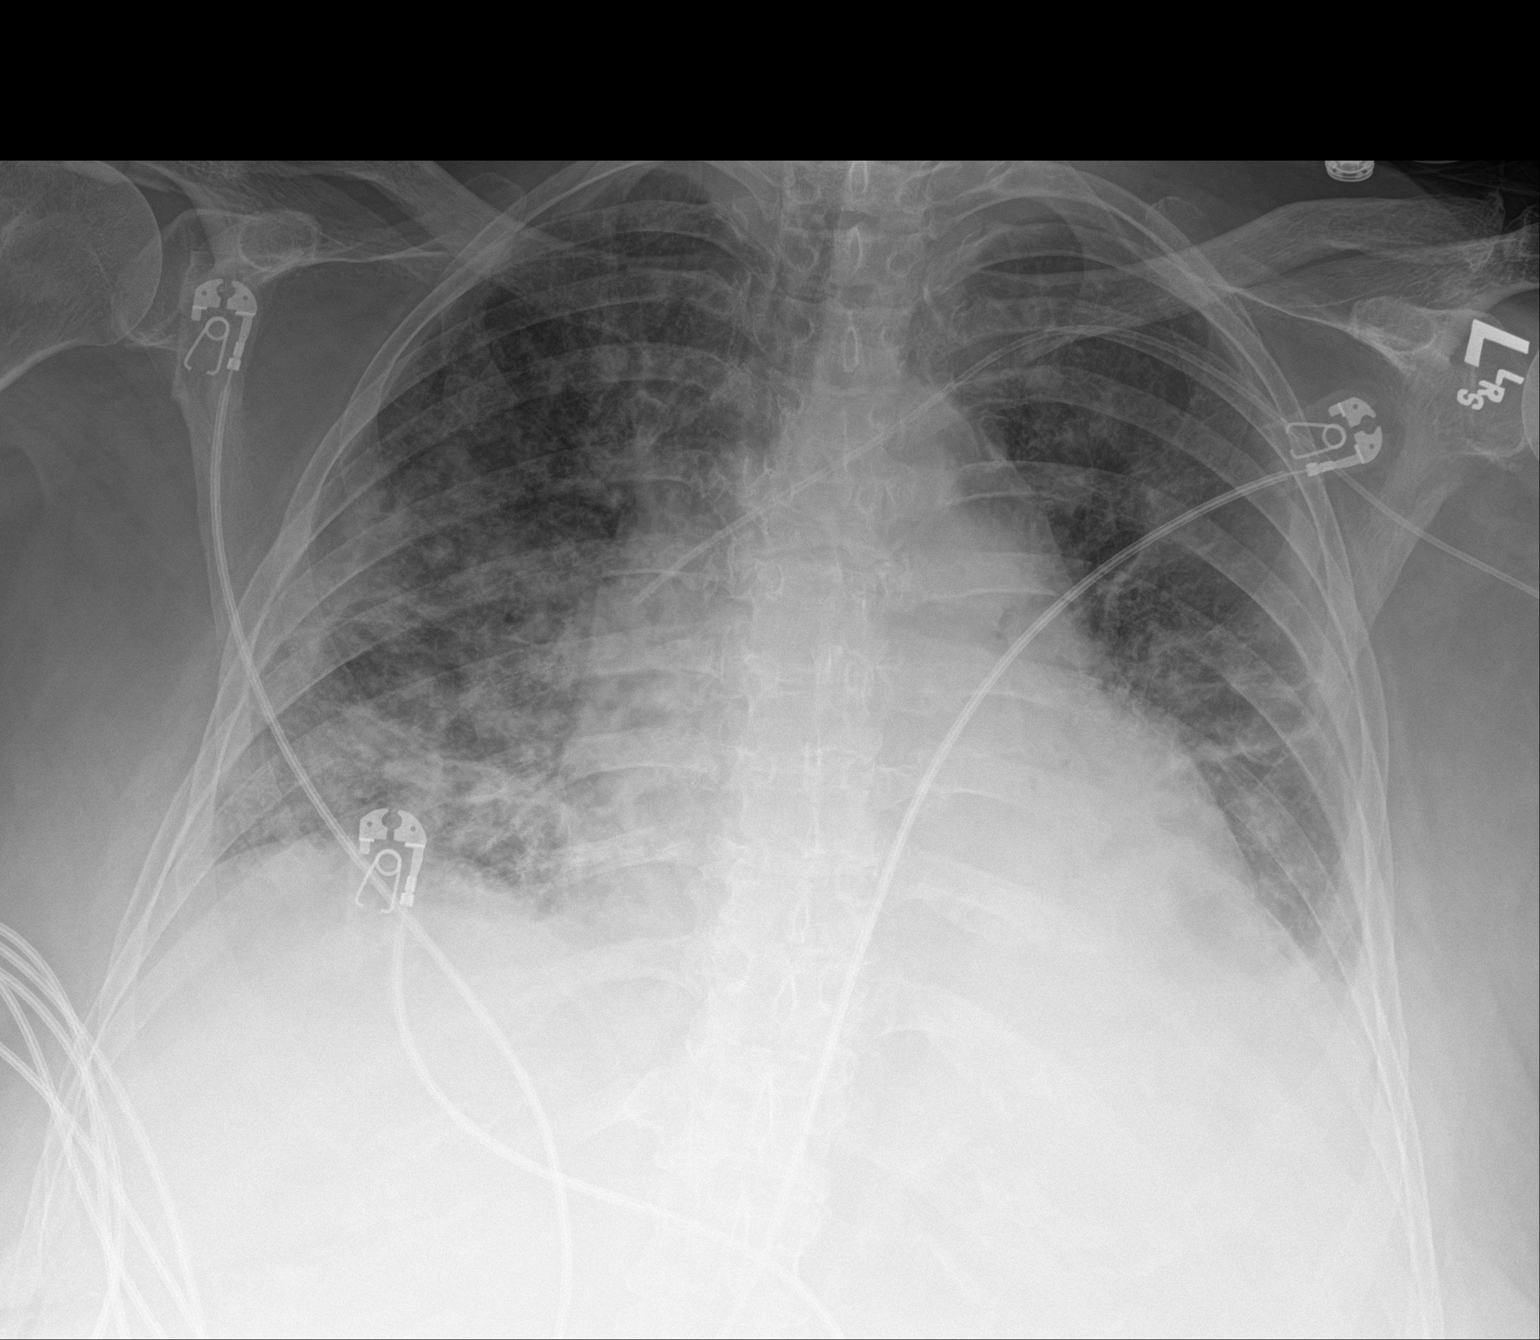

[1 of 1 positions shown; findings below may reference images not displayed]

FINDINGS: Stable cardiomegaly with central pulmonary vascular congestion is
noted. Interval placement of left-sided PICC line with distal tip in
expected position of the SVC. No pneumothorax is noted. Stable
bilateral patchy diffuse airspace opacities are noted concerning for
pneumonia or edema. Minimal left pleural effusion may be present.
Bony thorax is unremarkable.
IMPRESSION: Stable bilateral lung opacities are noted concerning for pneumonia
or edema. Minimal left pleural effusion is noted. Interval placement
of left-sided PICC line with distal tip in expected position of SVC.
